# Patient Record
Sex: Female | Born: 1945 | Race: White | Hispanic: No | Marital: Married | Smoking: Current every day smoker
Health system: Southern US, Community
[De-identification: ages and names within clinical notes are randomized; demographics above are authoritative.]

## PROBLEM LIST (undated history)

## (undated) DIAGNOSIS — W19XXXA Unspecified fall, initial encounter: Secondary | ICD-10-CM

## (undated) DIAGNOSIS — I5022 Chronic systolic (congestive) heart failure: Secondary | ICD-10-CM

## (undated) DIAGNOSIS — Y92009 Unspecified place in unspecified non-institutional (private) residence as the place of occurrence of the external cause: Secondary | ICD-10-CM

## (undated) DIAGNOSIS — E785 Hyperlipidemia, unspecified: Secondary | ICD-10-CM

## (undated) DIAGNOSIS — I82409 Acute embolism and thrombosis of unspecified deep veins of unspecified lower extremity: Secondary | ICD-10-CM

## (undated) DIAGNOSIS — E782 Mixed hyperlipidemia: Secondary | ICD-10-CM

## (undated) DIAGNOSIS — I255 Ischemic cardiomyopathy: Secondary | ICD-10-CM

## (undated) DIAGNOSIS — G2581 Restless legs syndrome: Secondary | ICD-10-CM

## (undated) DIAGNOSIS — I209 Angina pectoris, unspecified: Secondary | ICD-10-CM

## (undated) DIAGNOSIS — I70219 Atherosclerosis of native arteries of extremities with intermittent claudication, unspecified extremity: Secondary | ICD-10-CM

## (undated) DIAGNOSIS — L309 Dermatitis, unspecified: Secondary | ICD-10-CM

## (undated) DIAGNOSIS — D649 Anemia, unspecified: Secondary | ICD-10-CM

## (undated) DIAGNOSIS — F329 Major depressive disorder, single episode, unspecified: Secondary | ICD-10-CM

## (undated) DIAGNOSIS — M109 Gout, unspecified: Secondary | ICD-10-CM

## (undated) DIAGNOSIS — C801 Malignant (primary) neoplasm, unspecified: Secondary | ICD-10-CM

## (undated) DIAGNOSIS — I509 Heart failure, unspecified: Secondary | ICD-10-CM

## (undated) DIAGNOSIS — F32A Depression, unspecified: Secondary | ICD-10-CM

## (undated) DIAGNOSIS — M1990 Unspecified osteoarthritis, unspecified site: Secondary | ICD-10-CM

## (undated) DIAGNOSIS — M25569 Pain in unspecified knee: Secondary | ICD-10-CM

## (undated) DIAGNOSIS — Z95810 Presence of automatic (implantable) cardiac defibrillator: Secondary | ICD-10-CM

## (undated) DIAGNOSIS — I639 Cerebral infarction, unspecified: Secondary | ICD-10-CM

## (undated) DIAGNOSIS — IMO0001 Reserved for inherently not codable concepts without codable children: Secondary | ICD-10-CM

## (undated) DIAGNOSIS — G609 Hereditary and idiopathic neuropathy, unspecified: Secondary | ICD-10-CM

## (undated) DIAGNOSIS — I739 Peripheral vascular disease, unspecified: Secondary | ICD-10-CM

## (undated) DIAGNOSIS — M549 Dorsalgia, unspecified: Secondary | ICD-10-CM

## (undated) DIAGNOSIS — F3289 Other specified depressive episodes: Secondary | ICD-10-CM

## (undated) DIAGNOSIS — K219 Gastro-esophageal reflux disease without esophagitis: Secondary | ICD-10-CM

## (undated) DIAGNOSIS — N189 Chronic kidney disease, unspecified: Secondary | ICD-10-CM

## (undated) DIAGNOSIS — Z720 Tobacco use: Secondary | ICD-10-CM

## (undated) DIAGNOSIS — I2109 ST elevation (STEMI) myocardial infarction involving other coronary artery of anterior wall: Secondary | ICD-10-CM

## (undated) DIAGNOSIS — I2510 Atherosclerotic heart disease of native coronary artery without angina pectoris: Secondary | ICD-10-CM

## (undated) DIAGNOSIS — I10 Essential (primary) hypertension: Secondary | ICD-10-CM

## (undated) DIAGNOSIS — J449 Chronic obstructive pulmonary disease, unspecified: Secondary | ICD-10-CM

## (undated) HISTORY — DX: Presence of automatic (implantable) cardiac defibrillator: Z95.810

## (undated) HISTORY — DX: Depression, unspecified: F32.A

## (undated) HISTORY — DX: Mixed hyperlipidemia: E78.2

## (undated) HISTORY — DX: Cerebral infarction, unspecified: I63.9

## (undated) HISTORY — DX: Chronic obstructive pulmonary disease, unspecified: J44.9

## (undated) HISTORY — DX: Other specified depressive episodes: F32.89

## (undated) HISTORY — PX: ABDOMINAL HYSTERECTOMY: SHX81

## (undated) HISTORY — DX: Hyperlipidemia, unspecified: E78.5

## (undated) HISTORY — DX: Gout, unspecified: M10.9

## (undated) HISTORY — DX: Chronic systolic (congestive) heart failure: I50.22

## (undated) HISTORY — PX: OTHER SURGICAL HISTORY: SHX169

## (undated) HISTORY — DX: Unspecified osteoarthritis, unspecified site: M19.90

## (undated) HISTORY — DX: Major depressive disorder, single episode, unspecified: F32.9

## (undated) HISTORY — DX: Unspecified place in unspecified non-institutional (private) residence as the place of occurrence of the external cause: Y92.009

## (undated) HISTORY — DX: Tobacco use: Z72.0

## (undated) HISTORY — PX: FOOT SURGERY: SHX648

## (undated) HISTORY — DX: ST elevation (STEMI) myocardial infarction involving other coronary artery of anterior wall: I21.09

## (undated) HISTORY — DX: Malignant (primary) neoplasm, unspecified: C80.1

## (undated) HISTORY — PX: PR VEIN BYPASS GRAFT,AORTO-FEM-POP: 35551

## (undated) HISTORY — DX: Dorsalgia, unspecified: M54.9

## (undated) HISTORY — DX: Gastro-esophageal reflux disease without esophagitis: K21.9

## (undated) HISTORY — DX: Unspecified place in unspecified non-institutional (private) residence as the place of occurrence of the external cause: W19.XXXA

## (undated) HISTORY — DX: Atherosclerosis of native arteries of extremities with intermittent claudication, unspecified extremity: I70.219

## (undated) HISTORY — DX: Acute embolism and thrombosis of unspecified deep veins of unspecified lower extremity: I82.409

## (undated) HISTORY — PX: COLONOSCOPY: SHX174

## (undated) HISTORY — DX: Pain in unspecified knee: M25.569

## (undated) HISTORY — DX: Peripheral vascular disease, unspecified: I73.9

## (undated) HISTORY — DX: Dermatitis, unspecified: L30.9

## (undated) HISTORY — DX: Atherosclerotic heart disease of native coronary artery without angina pectoris: I25.10

## (undated) HISTORY — DX: Ischemic cardiomyopathy: I25.5

## (undated) HISTORY — PX: CARDIAC DEFIBRILLATOR PLACEMENT: SHX171

## (undated) HISTORY — DX: Restless legs syndrome: G25.81

## (undated) HISTORY — DX: Chronic kidney disease, unspecified: N18.9

---

## 1998-08-02 ENCOUNTER — Ambulatory Visit (HOSPITAL_COMMUNITY): Admission: RE | Admit: 1998-08-02 | Discharge: 1998-08-02 | Payer: Self-pay | Admitting: Family Medicine

## 1998-08-02 ENCOUNTER — Encounter: Payer: Self-pay | Admitting: Family Medicine

## 1998-10-13 ENCOUNTER — Ambulatory Visit (HOSPITAL_BASED_OUTPATIENT_CLINIC_OR_DEPARTMENT_OTHER): Admission: RE | Admit: 1998-10-13 | Discharge: 1998-10-13 | Payer: Self-pay | Admitting: Plastic Surgery

## 1998-12-27 ENCOUNTER — Other Ambulatory Visit: Admission: RE | Admit: 1998-12-27 | Discharge: 1998-12-27 | Payer: Self-pay | Admitting: Family Medicine

## 2000-01-30 ENCOUNTER — Encounter: Payer: Self-pay | Admitting: Family Medicine

## 2000-01-30 ENCOUNTER — Ambulatory Visit (HOSPITAL_COMMUNITY): Admission: RE | Admit: 2000-01-30 | Discharge: 2000-01-30 | Payer: Self-pay | Admitting: Family Medicine

## 2000-04-08 ENCOUNTER — Encounter: Payer: Self-pay | Admitting: Emergency Medicine

## 2000-04-08 ENCOUNTER — Emergency Department (HOSPITAL_COMMUNITY): Admission: EM | Admit: 2000-04-08 | Discharge: 2000-04-08 | Payer: Self-pay | Admitting: Internal Medicine

## 2000-09-17 HISTORY — PX: FEMORAL-TIBIAL BYPASS GRAFT: SHX938

## 2000-09-25 ENCOUNTER — Encounter: Payer: Self-pay | Admitting: Vascular Surgery

## 2000-09-25 ENCOUNTER — Ambulatory Visit (HOSPITAL_COMMUNITY): Admission: RE | Admit: 2000-09-25 | Discharge: 2000-09-25 | Payer: Self-pay | Admitting: Vascular Surgery

## 2000-10-07 ENCOUNTER — Inpatient Hospital Stay (HOSPITAL_COMMUNITY): Admission: RE | Admit: 2000-10-07 | Discharge: 2000-10-10 | Payer: Self-pay | Admitting: Vascular Surgery

## 2000-10-07 ENCOUNTER — Encounter: Payer: Self-pay | Admitting: Vascular Surgery

## 2001-03-05 ENCOUNTER — Other Ambulatory Visit: Admission: RE | Admit: 2001-03-05 | Discharge: 2001-03-05 | Payer: Self-pay | Admitting: *Deleted

## 2001-04-24 ENCOUNTER — Ambulatory Visit (HOSPITAL_COMMUNITY): Admission: RE | Admit: 2001-04-24 | Discharge: 2001-04-25 | Payer: Self-pay | Admitting: *Deleted

## 2001-04-24 ENCOUNTER — Encounter (INDEPENDENT_AMBULATORY_CARE_PROVIDER_SITE_OTHER): Payer: Self-pay | Admitting: *Deleted

## 2002-04-08 ENCOUNTER — Ambulatory Visit (HOSPITAL_COMMUNITY): Admission: RE | Admit: 2002-04-08 | Discharge: 2002-04-08 | Payer: Self-pay | Admitting: Gastroenterology

## 2002-04-08 ENCOUNTER — Encounter (INDEPENDENT_AMBULATORY_CARE_PROVIDER_SITE_OTHER): Payer: Self-pay | Admitting: *Deleted

## 2002-04-21 ENCOUNTER — Other Ambulatory Visit: Admission: RE | Admit: 2002-04-21 | Discharge: 2002-04-21 | Payer: Self-pay | Admitting: Family Medicine

## 2002-06-09 ENCOUNTER — Encounter: Payer: Self-pay | Admitting: General Surgery

## 2002-06-09 ENCOUNTER — Encounter: Admission: RE | Admit: 2002-06-09 | Discharge: 2002-06-09 | Payer: Self-pay | Admitting: General Surgery

## 2002-06-11 ENCOUNTER — Encounter (INDEPENDENT_AMBULATORY_CARE_PROVIDER_SITE_OTHER): Payer: Self-pay | Admitting: *Deleted

## 2002-06-11 ENCOUNTER — Ambulatory Visit (HOSPITAL_BASED_OUTPATIENT_CLINIC_OR_DEPARTMENT_OTHER): Admission: RE | Admit: 2002-06-11 | Discharge: 2002-06-11 | Payer: Self-pay | Admitting: General Surgery

## 2003-01-11 ENCOUNTER — Encounter: Payer: Self-pay | Admitting: Emergency Medicine

## 2003-01-11 ENCOUNTER — Encounter: Payer: Self-pay | Admitting: Internal Medicine

## 2003-01-11 ENCOUNTER — Inpatient Hospital Stay (HOSPITAL_COMMUNITY): Admission: EM | Admit: 2003-01-11 | Discharge: 2003-01-14 | Payer: Self-pay | Admitting: Emergency Medicine

## 2003-01-12 ENCOUNTER — Encounter (INDEPENDENT_AMBULATORY_CARE_PROVIDER_SITE_OTHER): Payer: Self-pay | Admitting: Cardiology

## 2003-02-03 ENCOUNTER — Ambulatory Visit (HOSPITAL_COMMUNITY): Admission: RE | Admit: 2003-02-03 | Discharge: 2003-02-03 | Payer: Self-pay | Admitting: *Deleted

## 2003-02-17 ENCOUNTER — Ambulatory Visit (HOSPITAL_COMMUNITY): Admission: RE | Admit: 2003-02-17 | Discharge: 2003-02-18 | Payer: Self-pay | Admitting: *Deleted

## 2003-12-04 ENCOUNTER — Emergency Department (HOSPITAL_COMMUNITY): Admission: EM | Admit: 2003-12-04 | Discharge: 2003-12-04 | Payer: Self-pay | Admitting: Emergency Medicine

## 2003-12-06 ENCOUNTER — Inpatient Hospital Stay (HOSPITAL_COMMUNITY): Admission: AD | Admit: 2003-12-06 | Discharge: 2003-12-10 | Payer: Self-pay | Admitting: Vascular Surgery

## 2003-12-07 ENCOUNTER — Encounter (INDEPENDENT_AMBULATORY_CARE_PROVIDER_SITE_OTHER): Payer: Self-pay | Admitting: Cardiology

## 2004-06-26 ENCOUNTER — Ambulatory Visit: Payer: Self-pay | Admitting: Internal Medicine

## 2004-06-26 ENCOUNTER — Ambulatory Visit (HOSPITAL_COMMUNITY): Admission: RE | Admit: 2004-06-26 | Discharge: 2004-06-26 | Payer: Self-pay | Admitting: Internal Medicine

## 2004-08-04 ENCOUNTER — Ambulatory Visit: Payer: Self-pay | Admitting: Internal Medicine

## 2004-08-09 ENCOUNTER — Ambulatory Visit: Payer: Self-pay | Admitting: Internal Medicine

## 2004-09-13 ENCOUNTER — Ambulatory Visit (HOSPITAL_COMMUNITY): Admission: RE | Admit: 2004-09-13 | Discharge: 2004-09-13 | Payer: Self-pay | Admitting: Internal Medicine

## 2004-10-25 ENCOUNTER — Ambulatory Visit (HOSPITAL_COMMUNITY): Admission: RE | Admit: 2004-10-25 | Discharge: 2004-10-25 | Payer: Self-pay | Admitting: Internal Medicine

## 2004-10-25 ENCOUNTER — Ambulatory Visit: Payer: Self-pay | Admitting: Internal Medicine

## 2004-11-03 ENCOUNTER — Ambulatory Visit: Payer: Self-pay | Admitting: Internal Medicine

## 2004-11-08 ENCOUNTER — Ambulatory Visit: Payer: Self-pay | Admitting: Internal Medicine

## 2005-01-12 ENCOUNTER — Ambulatory Visit (HOSPITAL_COMMUNITY): Admission: RE | Admit: 2005-01-12 | Discharge: 2005-01-12 | Payer: Self-pay | Admitting: Internal Medicine

## 2005-01-12 ENCOUNTER — Ambulatory Visit: Payer: Self-pay | Admitting: Internal Medicine

## 2005-01-17 ENCOUNTER — Ambulatory Visit (HOSPITAL_COMMUNITY): Admission: RE | Admit: 2005-01-17 | Discharge: 2005-01-17 | Payer: Self-pay | Admitting: Internal Medicine

## 2005-01-23 ENCOUNTER — Ambulatory Visit (HOSPITAL_COMMUNITY): Admission: RE | Admit: 2005-01-23 | Discharge: 2005-01-23 | Payer: Self-pay | Admitting: Internal Medicine

## 2005-01-23 ENCOUNTER — Ambulatory Visit: Payer: Self-pay | Admitting: Internal Medicine

## 2005-02-15 ENCOUNTER — Ambulatory Visit: Payer: Self-pay | Admitting: Internal Medicine

## 2005-03-01 ENCOUNTER — Ambulatory Visit: Payer: Self-pay | Admitting: Internal Medicine

## 2005-03-08 ENCOUNTER — Ambulatory Visit (HOSPITAL_COMMUNITY): Admission: RE | Admit: 2005-03-08 | Discharge: 2005-03-08 | Payer: Self-pay | Admitting: Internal Medicine

## 2005-03-09 ENCOUNTER — Ambulatory Visit: Payer: Self-pay

## 2005-03-29 ENCOUNTER — Ambulatory Visit: Payer: Self-pay | Admitting: Internal Medicine

## 2005-04-18 ENCOUNTER — Ambulatory Visit (HOSPITAL_COMMUNITY): Admission: RE | Admit: 2005-04-18 | Discharge: 2005-04-18 | Payer: Self-pay | Admitting: Vascular Surgery

## 2005-04-23 ENCOUNTER — Inpatient Hospital Stay (HOSPITAL_COMMUNITY): Admission: RE | Admit: 2005-04-23 | Discharge: 2005-04-25 | Payer: Self-pay | Admitting: Vascular Surgery

## 2005-06-06 ENCOUNTER — Ambulatory Visit: Payer: Self-pay | Admitting: Internal Medicine

## 2005-11-26 ENCOUNTER — Ambulatory Visit: Payer: Self-pay | Admitting: Internal Medicine

## 2005-11-28 ENCOUNTER — Ambulatory Visit (HOSPITAL_COMMUNITY): Admission: RE | Admit: 2005-11-28 | Discharge: 2005-11-28 | Payer: Self-pay | Admitting: Internal Medicine

## 2005-11-30 ENCOUNTER — Ambulatory Visit: Payer: Self-pay | Admitting: Internal Medicine

## 2005-12-11 ENCOUNTER — Ambulatory Visit: Payer: Self-pay | Admitting: Internal Medicine

## 2005-12-19 ENCOUNTER — Ambulatory Visit: Payer: Self-pay | Admitting: Gastroenterology

## 2006-01-23 ENCOUNTER — Ambulatory Visit: Payer: Self-pay | Admitting: Gastroenterology

## 2006-01-23 ENCOUNTER — Encounter (INDEPENDENT_AMBULATORY_CARE_PROVIDER_SITE_OTHER): Payer: Self-pay | Admitting: *Deleted

## 2006-01-31 ENCOUNTER — Ambulatory Visit: Payer: Self-pay | Admitting: Internal Medicine

## 2006-02-06 ENCOUNTER — Ambulatory Visit (HOSPITAL_COMMUNITY): Admission: RE | Admit: 2006-02-06 | Discharge: 2006-02-06 | Payer: Self-pay | Admitting: Gastroenterology

## 2006-02-19 ENCOUNTER — Ambulatory Visit: Payer: Self-pay | Admitting: Gastroenterology

## 2006-03-20 ENCOUNTER — Emergency Department (HOSPITAL_COMMUNITY): Admission: EM | Admit: 2006-03-20 | Discharge: 2006-03-20 | Payer: Self-pay | Admitting: Emergency Medicine

## 2006-03-20 ENCOUNTER — Encounter (INDEPENDENT_AMBULATORY_CARE_PROVIDER_SITE_OTHER): Payer: Self-pay | Admitting: *Deleted

## 2006-03-28 ENCOUNTER — Encounter (INDEPENDENT_AMBULATORY_CARE_PROVIDER_SITE_OTHER): Payer: Self-pay | Admitting: *Deleted

## 2006-04-03 ENCOUNTER — Ambulatory Visit: Payer: Self-pay | Admitting: Gastroenterology

## 2006-07-04 ENCOUNTER — Ambulatory Visit: Payer: Self-pay | Admitting: Internal Medicine

## 2006-07-04 ENCOUNTER — Encounter (INDEPENDENT_AMBULATORY_CARE_PROVIDER_SITE_OTHER): Payer: Self-pay | Admitting: Internal Medicine

## 2006-07-04 LAB — CONVERTED CEMR LAB
Hemoglobin: 12.2 g/dL (ref 12.0–15.0)
Platelets: 345 10*3/uL (ref 150–400)

## 2006-12-17 ENCOUNTER — Ambulatory Visit: Payer: Self-pay | Admitting: Internal Medicine

## 2006-12-17 DIAGNOSIS — G2581 Restless legs syndrome: Secondary | ICD-10-CM | POA: Insufficient documentation

## 2006-12-17 DIAGNOSIS — F17200 Nicotine dependence, unspecified, uncomplicated: Secondary | ICD-10-CM

## 2006-12-17 DIAGNOSIS — M949 Disorder of cartilage, unspecified: Secondary | ICD-10-CM

## 2006-12-17 DIAGNOSIS — M899 Disorder of bone, unspecified: Secondary | ICD-10-CM | POA: Insufficient documentation

## 2006-12-17 DIAGNOSIS — D6489 Other specified anemias: Secondary | ICD-10-CM | POA: Insufficient documentation

## 2006-12-17 DIAGNOSIS — K219 Gastro-esophageal reflux disease without esophagitis: Secondary | ICD-10-CM

## 2006-12-17 DIAGNOSIS — M545 Low back pain: Secondary | ICD-10-CM | POA: Insufficient documentation

## 2006-12-17 DIAGNOSIS — N200 Calculus of kidney: Secondary | ICD-10-CM | POA: Insufficient documentation

## 2006-12-17 DIAGNOSIS — E785 Hyperlipidemia, unspecified: Secondary | ICD-10-CM

## 2006-12-17 DIAGNOSIS — I10 Essential (primary) hypertension: Secondary | ICD-10-CM | POA: Insufficient documentation

## 2007-01-02 ENCOUNTER — Encounter (INDEPENDENT_AMBULATORY_CARE_PROVIDER_SITE_OTHER): Payer: Self-pay | Admitting: Internal Medicine

## 2007-01-08 ENCOUNTER — Encounter: Admission: RE | Admit: 2007-01-08 | Discharge: 2007-01-08 | Payer: Self-pay | Admitting: Orthopedic Surgery

## 2007-01-09 ENCOUNTER — Encounter (INDEPENDENT_AMBULATORY_CARE_PROVIDER_SITE_OTHER): Payer: Self-pay | Admitting: Internal Medicine

## 2007-01-09 ENCOUNTER — Telehealth: Payer: Self-pay | Admitting: *Deleted

## 2007-01-13 ENCOUNTER — Encounter (INDEPENDENT_AMBULATORY_CARE_PROVIDER_SITE_OTHER): Payer: Self-pay | Admitting: Internal Medicine

## 2007-01-13 DIAGNOSIS — I739 Peripheral vascular disease, unspecified: Secondary | ICD-10-CM

## 2007-01-30 ENCOUNTER — Ambulatory Visit: Payer: Self-pay | Admitting: Hospitalist

## 2007-01-30 ENCOUNTER — Encounter (INDEPENDENT_AMBULATORY_CARE_PROVIDER_SITE_OTHER): Payer: Self-pay | Admitting: Pulmonary Disease

## 2007-01-30 DIAGNOSIS — F329 Major depressive disorder, single episode, unspecified: Secondary | ICD-10-CM

## 2007-01-30 DIAGNOSIS — N390 Urinary tract infection, site not specified: Secondary | ICD-10-CM

## 2007-01-30 LAB — CONVERTED CEMR LAB
Bilirubin Urine: NEGATIVE
Bilirubin Urine: NEGATIVE
CO2: 23 meq/L (ref 19–32)
Chloride: 107 meq/L (ref 96–112)
Creatinine, Ser: 0.92 mg/dL (ref 0.40–1.20)
Glucose, Bld: 91 mg/dL (ref 70–99)
Ketones, ur: NEGATIVE mg/dL
Sodium: 142 meq/L (ref 135–145)
Specific Gravity, Urine: <1.005
Specific Gravity, Urine: 1.005 (ref 1.005–1.03)
Urobilinogen, UA: 0.2
pH: 5.5
pH: 6.5 (ref 5.0–8.0)

## 2007-02-18 ENCOUNTER — Telehealth (INDEPENDENT_AMBULATORY_CARE_PROVIDER_SITE_OTHER): Payer: Self-pay | Admitting: *Deleted

## 2007-04-15 ENCOUNTER — Telehealth: Payer: Self-pay | Admitting: Internal Medicine

## 2007-04-18 ENCOUNTER — Ambulatory Visit: Payer: Self-pay | Admitting: Vascular Surgery

## 2007-05-12 ENCOUNTER — Telehealth: Payer: Self-pay | Admitting: *Deleted

## 2007-06-11 ENCOUNTER — Telehealth: Payer: Self-pay | Admitting: *Deleted

## 2007-07-09 ENCOUNTER — Encounter (INDEPENDENT_AMBULATORY_CARE_PROVIDER_SITE_OTHER): Payer: Self-pay | Admitting: *Deleted

## 2007-07-09 ENCOUNTER — Ambulatory Visit (HOSPITAL_COMMUNITY): Admission: RE | Admit: 2007-07-09 | Discharge: 2007-07-09 | Payer: Self-pay | Admitting: Infectious Diseases

## 2007-07-09 ENCOUNTER — Ambulatory Visit: Payer: Self-pay | Admitting: Infectious Diseases

## 2007-07-09 DIAGNOSIS — R109 Unspecified abdominal pain: Secondary | ICD-10-CM | POA: Insufficient documentation

## 2007-07-09 DIAGNOSIS — R51 Headache: Secondary | ICD-10-CM

## 2007-07-09 DIAGNOSIS — R634 Abnormal weight loss: Secondary | ICD-10-CM

## 2007-07-09 DIAGNOSIS — R519 Headache, unspecified: Secondary | ICD-10-CM | POA: Insufficient documentation

## 2007-07-09 LAB — CONVERTED CEMR LAB
Albumin: 3.5 g/dL (ref 3.5–5.2)
Alkaline Phosphatase: 110 units/L (ref 39–117)
CO2: 27 meq/L (ref 19–32)
Chloride: 105 meq/L (ref 96–112)
Glucose, Bld: 87 mg/dL (ref 70–99)
Lymphocytes Relative: 26 % (ref 12–46)
Lymphs Abs: 2.8 10*3/uL (ref 0.7–3.3)
Monocytes Relative: 6 % (ref 3–11)
Neutro Abs: 7 10*3/uL (ref 1.7–7.7)
Neutrophils Relative %: 66 % (ref 43–77)
Potassium: 4.1 meq/L (ref 3.5–5.3)
RBC: 4.46 M/uL (ref 3.87–5.11)
Sodium: 141 meq/L (ref 135–145)
TSH: 2.721 microintl units/mL (ref 0.350–5.50)
Total Protein: 6.8 g/dL (ref 6.0–8.3)
WBC: 10.6 10*3/uL — ABNORMAL HIGH (ref 4.0–10.5)

## 2007-07-15 ENCOUNTER — Ambulatory Visit: Payer: Self-pay | Admitting: Internal Medicine

## 2007-07-15 DIAGNOSIS — G4700 Insomnia, unspecified: Secondary | ICD-10-CM | POA: Insufficient documentation

## 2007-07-15 LAB — CONVERTED CEMR LAB: Cholesterol, target level: 200 mg/dL

## 2007-07-28 ENCOUNTER — Telehealth: Payer: Self-pay | Admitting: *Deleted

## 2007-08-13 ENCOUNTER — Telehealth (INDEPENDENT_AMBULATORY_CARE_PROVIDER_SITE_OTHER): Payer: Self-pay | Admitting: Internal Medicine

## 2007-09-09 ENCOUNTER — Telehealth (INDEPENDENT_AMBULATORY_CARE_PROVIDER_SITE_OTHER): Payer: Self-pay | Admitting: Internal Medicine

## 2007-09-10 ENCOUNTER — Telehealth (INDEPENDENT_AMBULATORY_CARE_PROVIDER_SITE_OTHER): Payer: Self-pay | Admitting: Pharmacy Technician

## 2007-09-15 ENCOUNTER — Telehealth: Payer: Self-pay | Admitting: *Deleted

## 2007-09-16 ENCOUNTER — Telehealth: Payer: Self-pay | Admitting: *Deleted

## 2007-09-23 ENCOUNTER — Encounter (INDEPENDENT_AMBULATORY_CARE_PROVIDER_SITE_OTHER): Payer: Self-pay | Admitting: Internal Medicine

## 2007-09-23 ENCOUNTER — Ambulatory Visit: Payer: Self-pay | Admitting: Internal Medicine

## 2007-09-23 DIAGNOSIS — L989 Disorder of the skin and subcutaneous tissue, unspecified: Secondary | ICD-10-CM | POA: Insufficient documentation

## 2007-09-24 ENCOUNTER — Encounter (INDEPENDENT_AMBULATORY_CARE_PROVIDER_SITE_OTHER): Payer: Self-pay | Admitting: Internal Medicine

## 2007-10-03 ENCOUNTER — Telehealth: Payer: Self-pay | Admitting: *Deleted

## 2007-10-03 ENCOUNTER — Telehealth (INDEPENDENT_AMBULATORY_CARE_PROVIDER_SITE_OTHER): Payer: Self-pay | Admitting: Internal Medicine

## 2007-10-07 ENCOUNTER — Telehealth (INDEPENDENT_AMBULATORY_CARE_PROVIDER_SITE_OTHER): Payer: Self-pay | Admitting: Internal Medicine

## 2007-10-15 ENCOUNTER — Ambulatory Visit: Payer: Self-pay | Admitting: Internal Medicine

## 2007-10-15 ENCOUNTER — Encounter (INDEPENDENT_AMBULATORY_CARE_PROVIDER_SITE_OTHER): Payer: Self-pay | Admitting: Internal Medicine

## 2007-10-15 DIAGNOSIS — R5381 Other malaise: Secondary | ICD-10-CM

## 2007-10-15 DIAGNOSIS — R5383 Other fatigue: Secondary | ICD-10-CM

## 2007-10-15 DIAGNOSIS — L219 Seborrheic dermatitis, unspecified: Secondary | ICD-10-CM | POA: Insufficient documentation

## 2007-10-17 LAB — CONVERTED CEMR LAB
Alkaline Phosphatase: 118 units/L — ABNORMAL HIGH (ref 39–117)
BUN: 17 mg/dL (ref 6–23)
Free T4: 1.07 ng/dL (ref 0.89–1.80)
Glucose, Bld: 101 mg/dL — ABNORMAL HIGH (ref 70–99)
MCHC: 32 g/dL (ref 30.0–36.0)
MCV: 90.4 fL (ref 78.0–100.0)
RBC: 4.29 M/uL (ref 3.87–5.11)
Sodium: 140 meq/L (ref 135–145)
Total Bilirubin: 0.2 mg/dL — ABNORMAL LOW (ref 0.3–1.2)
Total Protein: 7.8 g/dL (ref 6.0–8.3)
Vit D, 1,25-Dihydroxy: 21 — ABNORMAL LOW (ref 30–89)

## 2007-12-05 ENCOUNTER — Encounter (INDEPENDENT_AMBULATORY_CARE_PROVIDER_SITE_OTHER): Payer: Self-pay | Admitting: Internal Medicine

## 2007-12-05 ENCOUNTER — Ambulatory Visit: Payer: Self-pay | Admitting: Hospitalist

## 2007-12-10 LAB — CONVERTED CEMR LAB
HDL: 70 mg/dL (ref >39)
Triglycerides: 169 mg/dL — ABNORMAL HIGH (ref <150)

## 2007-12-19 ENCOUNTER — Ambulatory Visit: Payer: Self-pay | Admitting: Internal Medicine

## 2007-12-19 ENCOUNTER — Encounter (INDEPENDENT_AMBULATORY_CARE_PROVIDER_SITE_OTHER): Payer: Self-pay | Admitting: Internal Medicine

## 2007-12-29 ENCOUNTER — Telehealth (INDEPENDENT_AMBULATORY_CARE_PROVIDER_SITE_OTHER): Payer: Self-pay | Admitting: Internal Medicine

## 2008-01-27 ENCOUNTER — Ambulatory Visit: Payer: Self-pay | Admitting: Internal Medicine

## 2008-01-27 ENCOUNTER — Encounter (INDEPENDENT_AMBULATORY_CARE_PROVIDER_SITE_OTHER): Payer: Self-pay | Admitting: Internal Medicine

## 2008-01-27 DIAGNOSIS — N951 Menopausal and female climacteric states: Secondary | ICD-10-CM | POA: Insufficient documentation

## 2008-01-28 LAB — CONVERTED CEMR LAB
ALT: <8 units/L (ref 0–35)
AST: 12 units/L (ref 0–37)
Creatinine, Ser: 1.29 mg/dL — ABNORMAL HIGH (ref 0.40–1.20)
HCT: 37.2 % (ref 36.0–46.0)
MCHC: 32 g/dL (ref 30.0–36.0)
MCV: 87.9 fL (ref 78.0–100.0)
Platelets: 311 10*3/uL (ref 150–400)
RDW: 14.7 % (ref 11.5–15.5)
Total Bilirubin: 0.2 mg/dL — ABNORMAL LOW (ref 0.3–1.2)

## 2008-01-30 ENCOUNTER — Encounter (INDEPENDENT_AMBULATORY_CARE_PROVIDER_SITE_OTHER): Payer: Self-pay | Admitting: Internal Medicine

## 2008-01-30 ENCOUNTER — Ambulatory Visit (HOSPITAL_COMMUNITY): Admission: RE | Admit: 2008-01-30 | Discharge: 2008-01-30 | Payer: Self-pay | Admitting: Internal Medicine

## 2008-03-04 ENCOUNTER — Telehealth (INDEPENDENT_AMBULATORY_CARE_PROVIDER_SITE_OTHER): Payer: Self-pay | Admitting: Internal Medicine

## 2008-03-16 ENCOUNTER — Telehealth (INDEPENDENT_AMBULATORY_CARE_PROVIDER_SITE_OTHER): Payer: Self-pay | Admitting: Internal Medicine

## 2008-03-22 ENCOUNTER — Telehealth (INDEPENDENT_AMBULATORY_CARE_PROVIDER_SITE_OTHER): Payer: Self-pay | Admitting: Internal Medicine

## 2008-03-23 ENCOUNTER — Ambulatory Visit: Payer: Self-pay | Admitting: Vascular Surgery

## 2008-05-13 ENCOUNTER — Telehealth (INDEPENDENT_AMBULATORY_CARE_PROVIDER_SITE_OTHER): Payer: Self-pay | Admitting: Internal Medicine

## 2008-05-25 ENCOUNTER — Telehealth: Payer: Self-pay | Admitting: *Deleted

## 2008-06-15 ENCOUNTER — Telehealth (INDEPENDENT_AMBULATORY_CARE_PROVIDER_SITE_OTHER): Payer: Self-pay | Admitting: Internal Medicine

## 2008-07-19 ENCOUNTER — Telehealth (INDEPENDENT_AMBULATORY_CARE_PROVIDER_SITE_OTHER): Payer: Self-pay | Admitting: Internal Medicine

## 2008-08-31 ENCOUNTER — Ambulatory Visit (HOSPITAL_COMMUNITY): Admission: RE | Admit: 2008-08-31 | Discharge: 2008-08-31 | Payer: Self-pay | Admitting: Internal Medicine

## 2008-08-31 ENCOUNTER — Encounter: Payer: Self-pay | Admitting: Licensed Clinical Social Worker

## 2008-08-31 ENCOUNTER — Encounter (INDEPENDENT_AMBULATORY_CARE_PROVIDER_SITE_OTHER): Payer: Self-pay | Admitting: Internal Medicine

## 2008-08-31 ENCOUNTER — Ambulatory Visit: Payer: Self-pay | Admitting: Internal Medicine

## 2008-08-31 DIAGNOSIS — J069 Acute upper respiratory infection, unspecified: Secondary | ICD-10-CM | POA: Insufficient documentation

## 2008-08-31 LAB — CONVERTED CEMR LAB
Basophils Absolute: 0.1 10*3/uL (ref 0.0–0.1)
Eosinophils Absolute: 0.2 10*3/uL (ref 0.0–0.7)
Eosinophils Relative: 2 % (ref 0–5)
HCT: 36.8 % (ref 36.0–46.0)
Lymphs Abs: 2.7 10*3/uL (ref 0.7–4.0)
MCV: 85.7 fL (ref 78.0–100.0)
Monocytes Absolute: 0.8 10*3/uL (ref 0.1–1.0)
Platelets: 233 10*3/uL (ref 150–400)
RDW: 14.7 % (ref 11.5–15.5)

## 2008-09-17 DIAGNOSIS — I2109 ST elevation (STEMI) myocardial infarction involving other coronary artery of anterior wall: Secondary | ICD-10-CM

## 2008-09-17 HISTORY — DX: ST elevation (STEMI) myocardial infarction involving other coronary artery of anterior wall: I21.09

## 2008-10-06 ENCOUNTER — Ambulatory Visit: Payer: Self-pay | Admitting: Infectious Disease

## 2008-10-06 ENCOUNTER — Encounter (INDEPENDENT_AMBULATORY_CARE_PROVIDER_SITE_OTHER): Payer: Self-pay | Admitting: Internal Medicine

## 2008-10-06 LAB — CONVERTED CEMR LAB
Alkaline Phosphatase: 121 units/L — ABNORMAL HIGH (ref 39–117)
Creatinine, Ser: 1.07 mg/dL (ref 0.40–1.20)
Glucose, Bld: 90 mg/dL (ref 70–99)
Sodium: 140 meq/L (ref 135–145)
Total Bilirubin: 0.3 mg/dL (ref 0.3–1.2)
Total Protein: 7.8 g/dL (ref 6.0–8.3)

## 2008-10-19 ENCOUNTER — Inpatient Hospital Stay (HOSPITAL_COMMUNITY): Admission: EM | Admit: 2008-10-19 | Discharge: 2008-10-24 | Payer: Self-pay | Admitting: Gastroenterology

## 2008-10-23 ENCOUNTER — Encounter: Payer: Self-pay | Admitting: Internal Medicine

## 2008-11-01 ENCOUNTER — Encounter: Payer: Self-pay | Admitting: Internal Medicine

## 2008-11-01 ENCOUNTER — Encounter: Admission: RE | Admit: 2008-11-01 | Discharge: 2008-11-01 | Payer: Self-pay | Admitting: Cardiology

## 2008-11-02 ENCOUNTER — Inpatient Hospital Stay (HOSPITAL_COMMUNITY): Admission: EM | Admit: 2008-11-02 | Discharge: 2008-11-06 | Payer: Self-pay | Admitting: Emergency Medicine

## 2008-11-04 ENCOUNTER — Encounter (INDEPENDENT_AMBULATORY_CARE_PROVIDER_SITE_OTHER): Payer: Self-pay | Admitting: Interventional Cardiology

## 2008-11-22 ENCOUNTER — Telehealth (INDEPENDENT_AMBULATORY_CARE_PROVIDER_SITE_OTHER): Payer: Self-pay | Admitting: Internal Medicine

## 2008-12-23 ENCOUNTER — Telehealth: Payer: Self-pay | Admitting: Internal Medicine

## 2008-12-27 ENCOUNTER — Encounter (INDEPENDENT_AMBULATORY_CARE_PROVIDER_SITE_OTHER): Payer: Self-pay | Admitting: *Deleted

## 2009-01-07 ENCOUNTER — Telehealth (INDEPENDENT_AMBULATORY_CARE_PROVIDER_SITE_OTHER): Payer: Self-pay | Admitting: Internal Medicine

## 2009-01-11 ENCOUNTER — Ambulatory Visit: Payer: Self-pay | Admitting: Internal Medicine

## 2009-01-11 DIAGNOSIS — I509 Heart failure, unspecified: Secondary | ICD-10-CM | POA: Insufficient documentation

## 2009-01-11 DIAGNOSIS — I2510 Atherosclerotic heart disease of native coronary artery without angina pectoris: Secondary | ICD-10-CM | POA: Insufficient documentation

## 2009-01-11 DIAGNOSIS — I2109 ST elevation (STEMI) myocardial infarction involving other coronary artery of anterior wall: Secondary | ICD-10-CM | POA: Insufficient documentation

## 2009-01-14 ENCOUNTER — Encounter: Payer: Self-pay | Admitting: Internal Medicine

## 2009-01-19 ENCOUNTER — Encounter: Payer: Self-pay | Admitting: Internal Medicine

## 2009-01-27 ENCOUNTER — Telehealth (INDEPENDENT_AMBULATORY_CARE_PROVIDER_SITE_OTHER): Payer: Self-pay | Admitting: Internal Medicine

## 2009-02-03 ENCOUNTER — Ambulatory Visit (HOSPITAL_COMMUNITY): Admission: RE | Admit: 2009-02-03 | Discharge: 2009-02-03 | Payer: Self-pay | Admitting: Interventional Cardiology

## 2009-02-03 ENCOUNTER — Ambulatory Visit: Payer: Self-pay | Admitting: Vascular Surgery

## 2009-02-16 ENCOUNTER — Telehealth (INDEPENDENT_AMBULATORY_CARE_PROVIDER_SITE_OTHER): Payer: Self-pay | Admitting: Internal Medicine

## 2009-02-23 ENCOUNTER — Telehealth (INDEPENDENT_AMBULATORY_CARE_PROVIDER_SITE_OTHER): Payer: Self-pay | Admitting: Internal Medicine

## 2009-03-17 HISTORY — PX: FEMORAL-FEMORAL BYPASS GRAFT: SHX936

## 2009-03-22 ENCOUNTER — Ambulatory Visit: Payer: Self-pay | Admitting: Vascular Surgery

## 2009-04-07 ENCOUNTER — Ambulatory Visit: Payer: Self-pay | Admitting: Vascular Surgery

## 2009-04-07 ENCOUNTER — Inpatient Hospital Stay (HOSPITAL_COMMUNITY): Admission: RE | Admit: 2009-04-07 | Discharge: 2009-04-12 | Payer: Self-pay | Admitting: Vascular Surgery

## 2009-04-25 ENCOUNTER — Encounter: Payer: Self-pay | Admitting: Internal Medicine

## 2009-04-26 ENCOUNTER — Ambulatory Visit: Payer: Self-pay | Admitting: Vascular Surgery

## 2009-05-06 ENCOUNTER — Encounter: Payer: Self-pay | Admitting: Internal Medicine

## 2009-05-24 ENCOUNTER — Telehealth (INDEPENDENT_AMBULATORY_CARE_PROVIDER_SITE_OTHER): Payer: Self-pay | Admitting: *Deleted

## 2009-05-25 ENCOUNTER — Ambulatory Visit: Payer: Self-pay | Admitting: Internal Medicine

## 2009-05-25 DIAGNOSIS — I5022 Chronic systolic (congestive) heart failure: Secondary | ICD-10-CM

## 2009-05-25 DIAGNOSIS — I2510 Atherosclerotic heart disease of native coronary artery without angina pectoris: Secondary | ICD-10-CM | POA: Insufficient documentation

## 2009-05-27 ENCOUNTER — Telehealth (INDEPENDENT_AMBULATORY_CARE_PROVIDER_SITE_OTHER): Payer: Self-pay | Admitting: *Deleted

## 2009-06-01 ENCOUNTER — Ambulatory Visit: Payer: Self-pay | Admitting: Internal Medicine

## 2009-06-01 DIAGNOSIS — I2589 Other forms of chronic ischemic heart disease: Secondary | ICD-10-CM

## 2009-06-02 LAB — CONVERTED CEMR LAB
BUN: 17 mg/dL (ref 6–23)
Basophils Absolute: 0 10*3/uL (ref 0.0–0.1)
CO2: 27 meq/L (ref 19–32)
Eosinophils Absolute: 0.1 10*3/uL (ref 0.0–0.7)
GFR calc non Af Amer: 40.4 mL/min (ref >60)
Glucose, Bld: 86 mg/dL (ref 70–99)
HCT: 35.4 % — ABNORMAL LOW (ref 36.0–46.0)
Hemoglobin: 11.8 g/dL — ABNORMAL LOW (ref 12.0–15.0)
Lymphs Abs: 1.9 10*3/uL (ref 0.7–4.0)
MCHC: 33.2 g/dL (ref 30.0–36.0)
MCV: 83.2 fL (ref 78.0–100.0)
Monocytes Absolute: 0.5 10*3/uL (ref 0.1–1.0)
Neutro Abs: 4.4 10*3/uL (ref 1.4–7.7)
Platelets: 188 10*3/uL (ref 150.0–400.0)
Potassium: 4.1 meq/L (ref 3.5–5.1)
Prothrombin Time: 10.6 s (ref 9.1–11.7)
RDW: 22.4 % — ABNORMAL HIGH (ref 11.5–14.6)

## 2009-06-06 ENCOUNTER — Ambulatory Visit (HOSPITAL_COMMUNITY): Admission: RE | Admit: 2009-06-06 | Discharge: 2009-06-07 | Payer: Self-pay | Admitting: Cardiology

## 2009-06-06 ENCOUNTER — Telehealth: Payer: Self-pay | Admitting: Internal Medicine

## 2009-06-06 ENCOUNTER — Ambulatory Visit: Payer: Self-pay | Admitting: Internal Medicine

## 2009-06-07 ENCOUNTER — Encounter: Payer: Self-pay | Admitting: Internal Medicine

## 2009-06-16 ENCOUNTER — Telehealth: Payer: Self-pay | Admitting: Internal Medicine

## 2009-06-23 ENCOUNTER — Ambulatory Visit: Payer: Self-pay

## 2009-06-23 ENCOUNTER — Ambulatory Visit: Payer: Self-pay | Admitting: Internal Medicine

## 2009-06-23 ENCOUNTER — Encounter (INDEPENDENT_AMBULATORY_CARE_PROVIDER_SITE_OTHER): Payer: Self-pay | Admitting: *Deleted

## 2009-06-23 DIAGNOSIS — Z95810 Presence of automatic (implantable) cardiac defibrillator: Secondary | ICD-10-CM

## 2009-07-27 ENCOUNTER — Ambulatory Visit: Payer: Self-pay | Admitting: Internal Medicine

## 2009-08-18 ENCOUNTER — Ambulatory Visit: Payer: Self-pay | Admitting: Internal Medicine

## 2009-08-18 DIAGNOSIS — R42 Dizziness and giddiness: Secondary | ICD-10-CM

## 2009-09-26 ENCOUNTER — Telehealth: Payer: Self-pay | Admitting: Internal Medicine

## 2009-09-28 ENCOUNTER — Telehealth: Payer: Self-pay | Admitting: *Deleted

## 2009-10-12 ENCOUNTER — Telehealth (INDEPENDENT_AMBULATORY_CARE_PROVIDER_SITE_OTHER): Payer: Self-pay | Admitting: *Deleted

## 2009-10-20 ENCOUNTER — Telehealth: Payer: Self-pay | Admitting: *Deleted

## 2009-10-21 ENCOUNTER — Ambulatory Visit: Payer: Self-pay | Admitting: Vascular Surgery

## 2009-10-24 ENCOUNTER — Telehealth: Payer: Self-pay | Admitting: Internal Medicine

## 2009-11-01 ENCOUNTER — Ambulatory Visit: Payer: Self-pay | Admitting: Gastroenterology

## 2009-11-15 ENCOUNTER — Ambulatory Visit: Payer: Self-pay | Admitting: Vascular Surgery

## 2009-11-27 ENCOUNTER — Encounter: Payer: Self-pay | Admitting: Internal Medicine

## 2009-11-28 ENCOUNTER — Ambulatory Visit: Payer: Self-pay | Admitting: Internal Medicine

## 2009-12-02 ENCOUNTER — Ambulatory Visit: Payer: Self-pay | Admitting: Vascular Surgery

## 2009-12-05 ENCOUNTER — Telehealth: Payer: Self-pay | Admitting: Internal Medicine

## 2009-12-13 ENCOUNTER — Encounter: Payer: Self-pay | Admitting: Internal Medicine

## 2009-12-19 ENCOUNTER — Telehealth (INDEPENDENT_AMBULATORY_CARE_PROVIDER_SITE_OTHER): Payer: Self-pay | Admitting: Internal Medicine

## 2010-01-05 ENCOUNTER — Ambulatory Visit (HOSPITAL_COMMUNITY): Admission: RE | Admit: 2010-01-05 | Discharge: 2010-01-05 | Payer: Self-pay | Admitting: Hematology & Oncology

## 2010-02-27 ENCOUNTER — Ambulatory Visit: Payer: Self-pay | Admitting: Internal Medicine

## 2010-03-16 ENCOUNTER — Encounter: Payer: Self-pay | Admitting: Internal Medicine

## 2010-03-30 ENCOUNTER — Telehealth: Payer: Self-pay | Admitting: Internal Medicine

## 2010-04-07 ENCOUNTER — Telehealth: Payer: Self-pay | Admitting: *Deleted

## 2010-04-24 ENCOUNTER — Inpatient Hospital Stay (HOSPITAL_COMMUNITY): Admission: AD | Admit: 2010-04-24 | Discharge: 2010-04-28 | Payer: Self-pay | Admitting: Internal Medicine

## 2010-04-25 ENCOUNTER — Ambulatory Visit: Payer: Self-pay | Admitting: Internal Medicine

## 2010-04-28 ENCOUNTER — Encounter: Payer: Self-pay | Admitting: Internal Medicine

## 2010-05-04 ENCOUNTER — Telehealth (INDEPENDENT_AMBULATORY_CARE_PROVIDER_SITE_OTHER): Payer: Self-pay | Admitting: *Deleted

## 2010-05-23 ENCOUNTER — Ambulatory Visit: Payer: Self-pay | Admitting: Vascular Surgery

## 2010-10-08 ENCOUNTER — Encounter: Payer: Self-pay | Admitting: Internal Medicine

## 2010-10-19 NOTE — Procedures (Signed)
Summary: EGD   EGD  Procedure date:  04/08/2002  Findings:      Location: [facility]                     [facility]  Patient:    [name], [name] Visit Number: [id number] MRN: [id number]          Service Type: END Location: ENDO Attending Physician:  Charna Elizabeth Dictated by:   Anselmo Rod, M.D. Proc. Date: 04/08/02 Admit Date:  04/08/2002 Discharge Date: 04/08/2002   CC:         Arvella Merles, M.D.   Procedure Report  DATE OF BIRTH:  1946/02/03  PROCEDURE PERFORMED:  Esophagogastroduodenoscopy.  ENDOSCOPIST:  Anselmo Rod, M.D.  INSTRUMENT USED:  Olympus video panendoscope.  INDICATIONS FOR PROCEDURE:  Dysphagia in a 65 year old white female, rule out esophageal strictures, esophagitis, etc.  PREPROCEDURE PREPARATION:  Informed consent was procured from the patient. The patient was fasted for eight hours prior to the procedure.  PREPROCEDURE PHYSICAL:  VITAL SIGNS:  Stable.  NECK:  Supple.  CHEST:  Clear to auscultation, S1 and S2 regular.  ABDOMEN:  Soft with normal bowel sounds.  DESCRIPTION OF PROCEDURE:  The patient was placed in the left lateral decubitus position and sedated with 50 mg of Demerol and 5 mg of Versed intravenously.  Once the patient was adequately sedated, and maintained on low flow oxygen and continuous cardiac monitoring, the Olympus video panendoscope was advanced through the mouth piece over the tongue into the esophagus under direct vision.  The entire esophagus appeared normal with no evidence of ring, stricture, masses, esophagitis, or Barretts esophagus.  The scope was then advanced into the stomach.  There was moderate antral gastritis.  No erosions, ulcerations, masses, or polyps were seen.  The proximal small bowel appeared normal.  No evidence of hiatal hernia was noted on retroflexion.  IMPRESSION:  Antral gastritis, otherwise normal  esophagogastroduodenoscopy.  RECOMMENDATIONS:  Proceed with a colonoscopy at this time. Dictated by:   Anselmo Rod, M.D. Attending Physician:  Charna Elizabeth DD:  04/08/02 TD:  04/11/02 Job: [id number]

## 2010-10-19 NOTE — Progress Notes (Signed)
Summary: Refill/gh  Phone Note Refill Request Message from:  Fax from Pharmacy on April 07, 2010 1:48 PM  Refills Requested: Medication #1:  PROTONIX 40 MG TBEC Thake one tablet at bedtime by mouth   Last Refilled: 02/01/2010 Last office visit was 07/2009.   Method Requested: Electronic Initial call taken by: Angelina Ok RN,  April 07, 2010 1:48 PM  Follow-up for Phone Call        needs ov. Follow-up by: Julaine Fusi  DO,  April 07, 2010 2:38 PM    Prescriptions: PROTONIX 40 MG TBEC (PANTOPRAZOLE SODIUM) Thake one tablet at bedtime by mouth  #31 x 0   Entered and Authorized by:   Julaine Fusi  DO   Signed by:   Julaine Fusi  DO on 04/07/2010   Method used:   [facility] [facility] [address] [id number]       [address]       [place], [place]  [address]       Ph: [phone]       Fax: [phone]   RxID:   [id number]

## 2010-10-19 NOTE — Progress Notes (Signed)
Summary: med refill/gp  Phone Note Refill Request Message from:  Fax from Pharmacy on October 24, 2009 1:53 PM  Refills Requested: Medication #1:  PLAVIX 75 MG TABS Take 1 tablet by mouth once a day   Last Refilled: 09/25/2009 Last appy. 07/27/09 .    Next appt.is 11/23/09 w/Dr. Logan Bores   Method Requested: Electronic Initial call taken by: Chinita Pester RN,  October 24, 2009 1:54 PM    Prescriptions: PLAVIX 75 MG TABS (CLOPIDOGREL BISULFATE) Take 1 tablet by mouth once a day  #30 x 11   Entered and Authorized by:   Zoila Shutter MD   Signed by:   Zoila Shutter MD on 10/25/2009   Method used:   [facility]        [facility] [facility] [address] [phone]ail[facility]       [address]       [place], [place]  [address]       Ph: [phone]       Fax: [phone]   RxID:   [id number]

## 2010-10-19 NOTE — Progress Notes (Signed)
Summary: med refill/gp  Phone Note Refill Request Message from:  Fax from Pharmacy on September 26, 2009 12:25 PM  Refills Requested: Medication #1:  CLONAZEPAM 1 MG TABS Take 2 tablets by mouth once a day at bedtime   Last Refilled: 08/28/2009  Method Requested: Electronic Initial call taken by: Chinita Pester RN,  September 26, 2009 12:25 PM  Follow-up for Phone Call        Looking at the last clinic note it appears [name] is on Clonazepam for depression.  Will refill to follow-up appointment.  Please schedule Ms. Pease to see Dr. Logan Bores within the next 2 months.  Thank You. Follow-up by: Doneen Poisson MD,  September 26, 2009 12:59 PM  Additional Follow-up for Phone Call Additional follow up Details #1::        Flag sent to [facility] for an appt. Additional Follow-up by: Chinita Pester RN,  September 26, 2009 1:45 PM    Prescriptions: CLONAZEPAM 1 MG TABS (CLONAZEPAM) Take 2 tablets by mouth once a day at bedtime  #60 x 2   Entered and Authorized by:   Doneen Poisson MD   Signed by:   Doneen Poisson MD on 09/26/2009   Method used:   Printed then faxed to ...       [facility] [address] [id number]       [address]       [place], [place]  [address]       Ph: [phone]       Fax: [phone]   RxID:   [id number]

## 2010-10-19 NOTE — Procedures (Signed)
Summary: EGD   EGD  Procedure date:  01/23/2006  Findings:      [facility]   Patient Name: [name], [name]. MRN:  Procedure Procedures: Panendoscopy (EGD) CPT: 43235.  Personnel: Endoscopist: Rachael Fee, MD.  Exam Location: Exam performed in Outpatient Clinic. Outpatient  Patient Consent: Procedure, Alternatives, Risks and Benefits discussed, consent obtained, from patient. Consent was obtained by the RN.  Indications Symptoms: Dysphagia.  Comments: mild, intermittent solid food dysphagia History  Current Medications: Patient is not currently taking Coumadin.  Pre-Exam Physical: Performed Jan 23, 2006  Cardio-pulmonary exam, Abdominal exam, Mental status exam WNL.  Comments: Pt. history reviewed/updated, physical exam performed prior to initiation of sedation? yes Exam Exam Info: Maximum depth of insertion Duodenum, intended Duodenum. Patient position: on left side. Gastric retroflexion performed. Images taken. ASA Classification: II. Tolerance: good.  Sedation Meds: Patient assessed and found to be appropriate for moderate (conscious) sedation. Residual sedation present from prior procedure today. Cetacaine Spray 1 sprays given aerosolized.  Monitoring: BP and pulse monitoring done. Oximetry used. Supplemental O2 given  Findings - Normal: Proximal Esophagus to Duodenal 2nd Portion. Comments: Otherwise normal examination.  OTHER FINDING: tortuous but otherwise normal esophagus. in Distal Esophagus.   Assessment Abnormal examination, see findings above.  Comments: Slightly tortuous distal esophagus, no strictures. Comments: This is likely the cause of her intermittent dysphagia.  Her symptoms are mild and she's gaining weight.  Would observe clinically for now. Events  Unplanned Intervention: No unplanned interventions were required.  Unplanned Events: There were no complications. Plans Comments: Chew food well.  Return  as needed. Disposition: After procedure patient sent to recovery. After recovery patient sent home.  This report was created from the original endoscopy report, which was reviewed and signed by the above listed endoscopist.

## 2010-10-19 NOTE — Cardiovascular Report (Signed)
Summary: Office Visit Remote   Office Visit Remote   Imported By: Roderic Ovens 03/17/2010 16:35:01  _____________________________________________________________________  External Attachment:    Type:   Image     Comment:   External Document

## 2010-10-19 NOTE — Progress Notes (Signed)
Summary: med refill/gp  Phone Note Refill Request Message from:  Fax from Pharmacy on May 04, 2010 10:37 AM  Refills Requested: Medication #1:  AMBIEN 10 MG TABS Take 1/2 tab by mouth at bedtime as needed for sleep   Last Refilled: 10/18/2009 Last appt.07/2009.  Flag sent to [facility] for an appt.   Method Requested: Telephone to Pharmacy Initial call taken by: Chinita Pester RN,  May 04, 2010 10:37 AM  Follow-up for Phone Call        AGree that pt needs appt. No showed last 2 appt. Follow-up by: Blanch Media MD,  May 04, 2010 11:20 AM  Additional Follow-up for Phone Call Additional follow up Details #1::        [facility] Additional Follow-up by: Chinita Pester RN,  May 04, 2010 2:07 PM    Prescriptions: AMBIEN 10 MG TABS (ZOLPIDEM TARTRATE) Take 1/2 tab by mouth at bedtime as needed for sleep, if  not effective in 30 minutes take and additional half tab once.  #16 x 0   Entered and Authorized by:   Blanch Media MD   Signed by:   Blanch Media MD on 05/04/2010   Method used:   Telephoned to ...       [facility] [address] [facility]       [address]       [place], [place]  [address]       Ph: [phone]       Fax: [phone]   RxID:   [id number]

## 2010-10-19 NOTE — Procedures (Signed)
Summary: Colonoscopy  Patient: [name] Note: All result statuses are Final unless otherwise noted.  Tests: (1) Colonoscopy (COL)   COL Colonoscopy           DONE     [facility]     [address]     [place], [place]  [address]           COLONOSCOPY PROCEDURE REPORT           PATIENT:  [name], [name]  MR#:  [id number]     BIRTHDATE:  April 16, 1946, 63 yrs. old  GENDER:  female     ENDOSCOPIST:  Iva Boop, MD, [facility]           PROCEDURE DATE:  04/28/2010     PROCEDURE:  Incomplete colonoscopy     ASA CLASS:  Class III     INDICATIONS:  anemia, microcytic, suspected to be chronic blood     loss     MEDICATIONS:   Fentanyl 100 mcg IV, Versed 10 mg IV, Benadryl 25     mg IV           DESCRIPTION OF PROCEDURE:   After the risks benefits and     alternatives of the procedure were thoroughly explained, informed     consent was obtained.  Digital rectal exam was performed and     revealed no abnormalities.   The EC-[id number] ([id number]) endoscope was     introduced through the anus and advanced to the sigmoid colon,     limited by extreme patient discomfort, a tortuous colon.    The     quality of the prep was excellent, using MiraLax.  The instrument     was then slowly withdrawn as the colon was fully examined.     <<PROCEDUREIMAGES>>           FINDINGS:  Normal rectum.  Normal colon otherwise in the sigmoid     colon but there was angulation and together with significant     discomfort, this led to discontinuing the procedure.   Retroflexed     views in the rectum revealed no abnormalities.    The scope was     then withdrawn from the patient and the procedure completed.           COMPLICATIONS:  None     ENDOSCOPIC IMPRESSION:     1) Normal rectum     2) Normal in the sigmoid colon to mid-sigmoid where there was     angulation amd stenosis.     3) Incomplete exam     RECOMMENDATIONS:     EGD next.           Iva Boop, MD, [facility]           n.   eSIGNED:   Iva Boop at 04/28/2010 12:57 PM           [place], [id number]  Note: An exclamation mark (!) indicates a result that was not dispersed into the flowsheet. Document Creation Date: 04/28/2010 12:58 PM _______________________________________________________________________  (1) Order result status: Final Collection or observation date-time: 04/28/2010 12:49 Requested date-time:  Receipt date-time:  Reported date-time:  Referring Physician:   Ordering Physician: Stan Head [phone]) Specimen Source:  Source: Launa Grill Order Number: [id number] Lab site:

## 2010-10-19 NOTE — Cardiovascular Report (Signed)
Summary: Office Visit Remote   Office Visit Remote   Imported By: Roderic Ovens 12/15/2009 11:55:34  _____________________________________________________________________  External Attachment:    Type:   Image     Comment:   External Document

## 2010-10-19 NOTE — Progress Notes (Signed)
Summary: Refill/gh  Phone Note Refill Request Message from:  Fax from Pharmacy on October 20, 2009 9:45 AM  Refills Requested: Medication #1:  VITAMIN D 42595 UNIT  CAPS Take 1 capsule by mouth once a week   Last Refilled: 09/23/2009 Last visit was 07/27/2009.  Last labs were 06/01/2009   Method Requested: Electronic Initial call taken by: Angelina Ok RN,  October 20, 2009 9:45 AM  Follow-up for Phone Call        Please have patient's PCP review the patient's history of Vitamin D deficiency and decide on an appropriate maintenance dose.  She appears to been on high-dose weekly supplementation for at least 2 months. Follow-up by: Margarito Liner MD,  October 20, 2009 10:56 AM  Additional Follow-up for Phone Call Additional follow up Details #1::        Next visit will need to check patient's vitamin D level. Script for 1000 units daily written and sent to patient's pharmacy electronically. Future order entered. Additional Follow-up by: Nilda Riggs MD,  October 20, 2009 8:44 PM    New/Updated Medications: VITAMIN D 1000 UNIT TABS (CHOLECALCIFEROL) Take 1 tablet by mouth once a day Prescriptions: VITAMIN D 1000 UNIT TABS (CHOLECALCIFEROL) Take 1 tablet by mouth once a day  #30 x 6   Entered and Authorized by:   Nilda Riggs MD   Signed by:   Nilda Riggs MD on 10/20/2009   Method used:   [facility]        [facility] [phone]       [address]       [place], [place]  [address]       Ph: [phone]       Fax: [phone]   RxID:   [id number]

## 2010-10-19 NOTE — Progress Notes (Signed)
----   Converted from flag ---- ---- 09/28/2009 4:30 PM, Chinita Pester RN wrote:   ---- 09/28/2009 4:29 PM, Chinita Pester RN wrote: Thanks  ---- 09/28/2009 3:48 PM, Shon Hough wrote: SCh has not been posed yet will save this flag until sch is posted.  ---- 09/26/2009 1:44 PM, Chinita Pester RN wrote: [name], [name] needs an appt. w/PCP within next 2 months per Dr. Josem Kaufmann  Thanks ------------------------------

## 2010-10-19 NOTE — Procedures (Signed)
Summary: Upper Endoscopy  Patient: [name] Note: All result statuses are Final unless otherwise noted.  Tests: (1) Upper Endoscopy (EGD)   EGD Upper Endoscopy       DONE     [facility]     [address]     [place], [place]  [address]           ENDOSCOPY PROCEDURE REPORT           PATIENT:  [name], [name]  MR#:  [id number]     BIRTHDATE:  1945-09-22, 63 yrs. old  GENDER:  female           ENDOSCOPIST:  Iva Boop, MD, [facility]           PROCEDURE DATE:  04/28/2010     PROCEDURE:  EGD, diagnostic     ASA CLASS:  Class III     INDICATIONS:  anemia microcytic, suspect from chronic blood loss           MEDICATIONS:   There was residual sedation effect present from     prior procedure., Versed 2 mg IV     TOPICAL ANESTHETIC:  none           DESCRIPTION OF PROCEDURE:   After the risks benefits and     alternatives of the procedure were thoroughly explained, informed     consent was obtained.  The [id number] ([id number]) endoscope was     introduced through the mouth and advanced to the second portion of     the duodenum, limited by patient discomfort.   The instrument was     slowly withdrawn as the mucosa was fully examined.     <<PROCEDUREIMAGES>>           The upper, middle, and distal third of the esophagus were     carefully inspected and no abnormalities were noted. The z-line     was well seen at the GEJ. The endoscope was pushed into the fundus     which was normal including a retroflexed view. The antrum,gastric     body, first and second part of the duodenum were unremarkable.     Some mucoid adherent material in esophagus that flushes.     Retroflexed views revealed no abnormalities.    The scope was then     withdrawn from the patient and the procedure completed.           COMPLICATIONS:  None           ENDOSCOPIC IMPRESSION:     1) Normal EGD     RECOMMENDATIONS:     FUTURE ENDOSCOPIC STUDIES WILL PROBABLY REQUIRE DEEP SEDATION OR  GENERAL ANESTHESIA. SHE DID NOT TOLERATE THIS WELL AND COULD NOT     COMPLETE COLONOSCOPY.           Will not pursue further work-up as we have not proven blood loss     and her comorbidities limit abilities to do so also. i.e.     risk-benefit ration does not favor pursuing further work-up in my     opinion. Anemia could be nutritional also.     She has had iv iron and should also try to take po iron.     Follow-up GI as needed.           Iva Boop, MD, [facility]           n.     eSIGNED:   Maryjean Morn.  Gessner at 04/28/2010 01:15 PM           [name], [name], [id number]  Note: An exclamation mark (!) indicates a result that was not dispersed into the flowsheet. Document Creation Date: 04/28/2010 1:17 PM _______________________________________________________________________  (1) Order result status: Final Collection or observation date-time: 04/28/2010 13:07 Requested date-time:  Receipt date-time:  Reported date-time:  Referring Physician:   Ordering Physician: Stan Head [phone]) Specimen Source:  Source: Launa Grill Order Number: [id number] Lab site:

## 2010-10-19 NOTE — Progress Notes (Signed)
Summary: med refill/gp  Phone Note Refill Request Message from:  Fax from Pharmacy on December 05, 2009 10:46 AM  Refills Requested: Medication #1:  PREMARIN 0.625 MG  TABS Take one tablet daily.   Last Refilled: 11/02/2009 Last appt. 11.10.10.   Method Requested: Electronic Initial call taken by: Chinita Pester RN,  December 05, 2009 10:46 AM  Follow-up for Phone Call        Reviewed chart.  Last saw Dr Logan Bores on 07/27/09 and he requested a three month F/U.  Pt has no scheduled appts in system.  Refill request for premarin.  Has been on this since at least 12/17/06 when the EMR was started.  A note on 10/15/07 indicated that it had been started for hot flushes.  Pt now on for at least 3 years.  Probably time to taper and see if can can tolerate D/C of med to decrease risk of long term hormone tx.   Follow-up by: Blanch Media MD,  December 05, 2009 11:24 AM    Prescriptions: PREMARIN 0.625 MG  TABS (ESTROGENS CONJUGATED) Take one tablet daily.  #32 x 2   Entered and Authorized by:   Blanch Media MD   Signed by:   Blanch Media MD on 12/05/2009   Method used:   [facility]        [facility] [facility]       [address]       [place], [place]  [address]       Ph: [phone]       Fax: [phone]   RxID:   [id number]

## 2010-10-19 NOTE — Progress Notes (Signed)
Summary: pt following with Dr. Amil Amen  Phone Note Outgoing Call Call back at [facility] Phone [phone]   Caller: Patient Reason for Call: Talk to Nurse, Talk to Doctor Summary of Call: pt is following with Dr. Amil Amen. He has a machine for home checks dose she have to send that back to use or dose she keep it. Initial call taken by: Omer Jack,  March 30, 2010 8:52 AM Summary of Call: lmtc for pt--returning call. Vella Kohler  March 30, 2010 4:10 PM  Follow-up for Phone Call        ok. Follow-up by: Laren Boom, MD, [facility],  April 28, 2010 5:43 PM

## 2010-10-19 NOTE — Procedures (Signed)
Summary: EGD   EGD  Procedure date:  03/20/2006  Findings:      Location: [facility]   NAME:  [name], [name]                ACCOUNT NO.:  [id number]   MEDICAL RECORD NO.:  [id number]          PATIENT TYPE:  EMS   LOCATION:  MAJO                         FACILITY:  [facility]   PHYSICIAN:  Georgiana Spinner, M.D.    DATE OF BIRTH:  July 12, 1946   DATE OF PROCEDURE:  03/20/2006  DATE OF DISCHARGE:                                 OPERATIVE REPORT   PROCEDURE:  Upper endoscopy with foreign body removal.   ANESTHESIA:  Demerol 25, Versed 12 mg.   PROCEDURE:  With the patient mildly sedated in the left lateral decubitus  position, the Olympus videoscopic endoscope was inserted into the mouth. The  patient was (too much static)           ______________________________  Georgiana Spinner, M.D.     GMO/MEDQ  D:  03/20/2006  T:  03/20/2006  Job:  [id number]

## 2010-10-19 NOTE — Procedures (Signed)
Summary: colon   Colonoscopy  Procedure date:  01/23/2006  Findings:      Location:  [facility].   Patient Name: [name], [name]. MRN:  Procedure Procedures: Colonoscopy CPT: [phone].    with polypectomy. CPT: A3573898.  Personnel: Endoscopist: Rachael Fee, MD.  Exam Location: Exam performed in Outpatient Clinic. Outpatient  Patient Consent: Procedure, Alternatives, Risks and Benefits discussed, consent obtained, from patient. Consent was obtained by the RN.  Indications  Surveillance of: Adenomatous Polyp(s).  History  Current Medications: Patient is not currently taking Coumadin.  Pre-Exam Physical: Performed Jan 23, 2006. Cardio-pulmonary exam, Abdominal exam, Mental status exam WNL.  Comments: Pt. history reviewed/updated, physical exam performed prior to initiation of sedation? yes Exam Exam: Extent of exam reached: Cecum, extent intended: Cecum.  The cecum was identified by appendiceal orifice and IC valve. Patient position: on left side. Colon retroflexion performed. Images taken. ASA Classification: II. Tolerance: fair, adequate exam.  Monitoring: Pulse and BP monitoring, Oximetry used. Supplemental O2 given.  Colon Prep Prep results: good.  Sedation Meds: Patient assessed and found to be appropriate for moderate (conscious) sedation. Fentanyl 150 mcg. given IV. Versed 15 given IV.  Findings POLYP: Descending Colon, Maximum size: 10 mm. sessile polyp. Procedure:  snare with cautery, removed, retrieved, Polyp sent to pathology. Path # 1. ICD9: Colon Polyps: 211.3.  - NORMAL EXAM: Cecum to Rectum. Comments: Otherwise normal examination.  - HEMORRHOIDS: External. Size: Medium. ICD9: Hemorrhoids, External: 455.3.   Assessment Abnormal examination, see findings above.  Diagnoses: 211.3: Colon Polyps.  455.3: Hemorrhoids, External.   Comments: Single colon polyp, no cancers. Events  Unplanned Interventions: No intervention was  required.  Unplanned Events: There were no complications. Plans Comments: She will need surveillance colonoscopy in 3 years, sooner if pathology is concerning. Disposition: After procedure patient sent to recovery. After recovery patient sent home.  This report was created from the original endoscopy report, which was reviewed and signed by the above listed endoscopist.

## 2010-10-19 NOTE — Procedures (Signed)
Summary: Colon   Colonoscopy  Procedure date:  04/08/2002  Findings:      Location:  [facility].                     [facility]  Patient:    [name], [name] Visit Number: [id number] MRN: [id number]          Service Type: END Location: ENDO Attending Physician:  Charna Elizabeth Dictated by:   Anselmo Rod, M.D. Proc. Date: 04/08/02 Admit Date:  04/08/2002 Discharge Date: 04/08/2002   CC:         Arvella Merles, M.D.   Procedure Report  DATE OF BIRTH:  09-Feb-2046  PROCEDURE:  Colonoscopy with biopsies.  ENDOSCOPIST:  Anselmo Rod, M.D.  INSTRUMENT:  Pediatric adjustable Olympus colonoscope.  INDICATIONS FOR PROCEDURE:  This is a 65 year old white female with a questionable family history of colon cancer to rule out colonic polyps, masses, hemorrhoids, etc.  PREPROCEDURE PREPARATION:  Informed consent was secured from the patient.  The patient was fasted for eight hours prior to the procedure and prepped with a bottle of magnesium citrate and one gallon of NuLytely the night prior to the procedure.  PREPROCEDURE PHYSICAL:  The patient had stable vital signs.  Neck is supple. The chest is clear to auscultation.  Abdomen is soft with normal bowel sounds.  DESCRIPTION OF PROCEDURE:  The patient was placed in the left lateral decubitus position and sedated with an additional 6.25 mg of Demerol and 5 mg of Versed intravenously.  Once the patient was adequately sedated and maintained on low flow oxygen and continuous cardiac monitoring, the Olympus video colonoscope was advanced from the rectum to the cecum with difficulty. There was a large amount of residual in the colon.  Multiple washings were done.  The procedure was completed to the cecum.  The appendiceal orifice and ileocecal valve were clearly visualized and photographed.  A small patch of erythema was seen at 30 cm.  This was biopsied for pathology.  Small internal hemorrhoids  were seen on retroflexion.  Due to some residual stool in the colon, very small lesions could have been missed.  IMPRESSION: 1. Patchy erythema at 30 cm biopsied for pathology. 2. Swollen non-bleeding internal hemorrhoids. 3. Significant amount of residual stool in the colon.  RECOMMENDATIONS: 1. Await pathology results. 2. Outpatient follow-up in the next 7-10 days for further    recommendations. Dictated by:   Anselmo Rod, M.D. Attending Physician:  Charna Elizabeth DD:  04/08/02 TD:  04/11/02 Job: [id number]

## 2010-10-19 NOTE — Letter (Signed)
Summary: Remote Device Check  [employer], Main Office  [address]   [place], [place] [address]   Phone: [phone]  Fax: [phone]     December 13, 2009 MRN: [id number]   CHAUNCEY BRUNO [address] [place], [place]  [address]   Dear Ms. Olheiser,   Your remote transmission was recieved and reviewed by your physician.  All diagnostics were within normal limits for you.  __X___Your next transmission is scheduled for: February 27, 2010.  Please transmit at any time this day.  If you have a wireless device your transmission will be sent automatically.     Sincerely,  Proofreader

## 2010-10-19 NOTE — Letter (Signed)
Summary: Remote Device Check  [facility] [employer], Main Office  [address]   [place], [place] [address]   Phone: [phone]  Fax: [phone]     March 16, 2010 MRN: [id number]   [name] [address] [place], [place]  [address]   Dear [name],   Your remote transmission was recieved and reviewed by your physician.  All diagnostics were within normal limits for you.   __X____Your next office visit is scheduled for:  September 2011 with Dr Ladona Ridgel.                             Please call our office to schedule an appointment.    Sincerely,  Vella Kohler

## 2010-10-19 NOTE — Procedures (Signed)
Summary: EGD   EGD  Procedure date:  03/28/2006  Findings:      Location: [facility]   NAME:  [name], [name]                ACCOUNT NO.:  [id number]   MEDICAL RECORD NO.:  [id number]          PATIENT TYPE:  AMB   LOCATION:  ENDO                         FACILITY[facility]  [name]   PHYSICIAN:  Georgiana Spinner, M.D.    DATE OF BIRTH:  02-18-1946   DATE OF PROCEDURE:  DATE OF DISCHARGE:  02/06/2006                                 OPERATIVE REPORT   PROCEDURE:  Upper endoscopy with foreign body removal.   ANESTHESIA:  Demerol 125 mg, Versed 12 mg was given.   PROCEDURE:  With the patient mildly sedated in the left lateral decubitus  position, the Olympus videoscopic endoscope was inserted in the mouth, and  the patient was somewhat combative with each time we had to intubate her,  but we were able to pass the endoscope into the mouth and advance distally,  and found a large foreign body of meat in the distal esophagus.  First using  the [employer], we attempted to ensnare this, but were able to  break off only pieces of it, which we removed through the mouth.  We  reinserted the endoscope and switched to using a tripod three-pronged  instrument to grasp this meat, and subsequently we were able to grasp enough  of this, in the center of it, to pull it out.  There was some hesitation as  it came through the upper esophageal sphincter, but we were able to, in  fact, keep it in the tripod and remove it.  Subsequently, it was  approximately the length of my thumb, which is probably almost 2 inches  long, and we retrieved it whole.  We then subsequently reinserted the  endoscope into the mouth and passed distally.  There was minimal irritation  at the level of the distal esophagus, where the food had been lodged, but we  were able to advance easily the endoscope into the stomach.  Fundus, body,  antrum, duodenal bulb, second portion of duodenum were visualized.  From  this  point the endoscope was slowly withdrawn, taking circumferential views  of duodenal mucosa until the endoscope had been pulled back into the  stomach, placed in retroflexion to view the stomach from below.  The  endoscope was then straightened and withdrawn.  The patient's vital signs  and pulse oximeter remained stable.  The patient tolerated procedure well  without apparent complications.   FINDINGS:  Dysphagia with steak lodged in the distal esophagus, removed by  endoscopic means.   PLAN:  Strongly cautioned the patient about better chewing technique and/or  cutting of her meats especially in the future, and also discussed this with  family members present to help avoid a recurrence of this clinical problem  Other follow-up is as arranged and suggested by Dr. Christella Hartigan           ______________________________  Georgiana Spinner, M.D.     GMO/MEDQ  D:  03/28/2006  T:  03/28/2006  Job:  [id number]  cc:   Rachael Fee, M.D.

## 2010-10-19 NOTE — Assessment & Plan Note (Signed)
Review of gastrointestinal problems: 1. adenomatous colon polyps, colonoscopy by Dr. Christella Hartigan may, 2007 10 mm adenomatous polyp was removed. Colonoscopy 2003 by Dr. Loreta Ave adenomatous polyps were also removed    History of Present Illness Visit Type: Follow-up Visit Primary GI MD: Rob Bunting MD Primary Provider: Holley Bouche, MD Chief Complaint: Colon consult, rectal bleed History of Present Illness:     she had about a year of loose stools, daily, watery.  Non-bloody.  She was given align samples and that helped. she is back to two BMs a day.  she had 3 days limited, minor rectal bleeding when her loose stools subsided but that has stopped. She is very happy with her bowels now.  She had STEMI about one year ago.  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  She thinks she had stent placed as well.  She understands that she has another blockage as well.  she has an EF of 20-25% per recent cardiology note in EMR (Dr. Ladona Ridgel).           Current Medications (verified): 1)  Protonix 40 Mg Tbec (Pantoprazole Sodium) .... Thake One Tablet At Bedtime By Mouth 2)  Clonazepam 1 Mg Tabs (Clonazepam) .... Take 2 Tablets By Mouth Once A Day At Bedtime 3)  Ambien 10 Mg Tabs (Zolpidem Tartrate) .... Take 1/2 Tab By Mouth At Bedtime As Needed For Sleep, If  Not Effective in 30 Minutes Take and Additional Half Tab Once. 4)  Crestor 20 Mg Tabs (Rosuvastatin Calcium) .... Take One Tablet By Mouth Daily. 5)  Aspir-Trin 325 Mg Tbec (Aspirin) .... Take One Tablet By Mouth At Bedtime Prior To Niaspan. 6)  Klor-Con M20 20 Meq Cr-Tabs (Potassium Chloride Crys Cr) .... Take 1 Tablet By Mouth Once A Day 7)  Metoprolol Tartrate 25 Mg Tabs (Metoprolol Tartrate) .... Take 1 Tablet By Mouth Once A Day 8)  Ramipril 2.5 Mg Caps (Ramipril) .... Take 1 Tablet By Mouth Once A Day 9)  Premarin 0.625 Mg  Tabs (Estrogens Conjugated) .... Take One Tablet Daily. 10)  Plavix 75 Mg Tabs (Clopidogrel Bisulfate) .... Take 1  Tablet By Mouth Once A Day 11)  Furosemide 40 Mg Tabs (Furosemide) .... Take 1 Tablet By Mouth Once A Day 12)  Vitamin D 1000 Unit Tabs (Cholecalciferol) .... Take 1 Tablet By Mouth Once A Day 13)  Vicodin 5-500 Mg Tabs (Hydrocodone-Acetaminophen) .... As Needed 14)  Requip 0.25 Mg Tabs (Ropinirole Hydrochloride) .... Take 1 Tablets By Mouth At Bedtime 15)  Nitroglycerin 0.4 Mg Subl (Nitroglycerin) .... One Tablet Under Tongue Every 5 Minutes As Needed For Chest Pain---May Repeat Times Three 16)  Fish Oil   Oil (Fish Oil) .... Take One Tablet By Mouth Twice Daily. 17)  Tylenol 325 Mg Tabs (Acetaminophen) .... As Needed 18)  Mirtazapine 15 Mg Tabs (Mirtazapine) .... Take 1 Tablet At Bedtime  Allergies (verified): 1)  ! Morphine  Vital Signs:  Patient profile:   65 year old female Height:      64.5 inches Weight:      134 pounds BMI:     22.73 Pulse rate:   80 / minute Pulse rhythm:   regular BP sitting:   114 / 80  (left arm) Cuff size:   regular  Vitals Entered By: June McMurray CMA Duncan Dull) (November 01, 2009 2:06 PM)  Physical Exam  Additional Exam:  Constitutional: Chronically ill appearing, slightly dyspneic on exam Psychiatric: alert and oriented times 3 Abdomen: soft, non-tender, non-distended, normal bowel sounds trace  to 1+ lower extremity edema    Impression & Recommendations:  Problem # 1:  personal history of colon polyps, recent diarrhea resolved, recent rectal bleeding resolved she has an ejection fraction between 20-25% from ischemic cardiomyopathy. She is on Plavix for significant coronary artery disease and stenting.  she has baseline dyspnea. I do not think that polyp surveillance is necessarily a very relevanty issue for her anymore and so I do not thinkthat we should proceed with colonoscopy at this point. She knows however to get in touch if she has any significant changes in her bowels, bleeding.  Patient Instructions: 1)  No plans for colonoscopy for polyp  surveilance at this point. 2)  A copy of this information will be sent to Dr. Holley Bouche, Dr. Corliss Marcus. 3)  If significant new GI symptoms occur (bleeding, new changes in bowel habits) please call Dr. Christella Hartigan. 4)  The medication list was reviewed and reconciled.  All changed / newly prescribed medications were explained.  A complete medication list was provided to the patient / caregiver.

## 2010-10-19 NOTE — Progress Notes (Signed)
Summary: refill/gg  Phone Note Refill Request  on October 12, 2009 11:48 AM  Refills Requested: Medication #1:  AMBIEN 5 MG  TABS take 1/2 tablet by mouth before bedtime. Can take another 1/2 tablet in 30 min if still not asleep.   Last Refilled: 09/13/2009 refill is for ambien 10 mg with same directions  Initial call taken by: Merrie Roof RN,  October 12, 2009 11:49 AM  Follow-up for Phone Call        Will change to 10 with same instructions Follow-up by: Julaine Fusi  DO,  October 12, 2009 12:02 PM  Additional Follow-up for Phone Call Additional follow up Details #1::        Rx faxed to pharmacy Additional Follow-up by: Merrie Roof RN,  October 18, 2009 9:30 AM    New/Updated Medications: AMBIEN 10 MG TABS (ZOLPIDEM TARTRATE) Take 1/2 tab by mouth at bedtime as needed for sleep, if  not effective in 30 minutes take and additional half tab once. Prescriptions: AMBIEN 10 MG TABS (ZOLPIDEM TARTRATE) Take 1/2 tab by mouth at bedtime as needed for sleep, if  not effective in 30 minutes take and additional half tab once.  #30 x 0   Entered by:   Julaine Fusi  DO   Authorized by:   Marland Kitchen OPC ATTENDING DESKTOP   Signed by:   Julaine Fusi  DO on 10/12/2009   Method used:   Telephoned to ...       [facility]  [address] [id number] (retail[facility]       [address]       [place], [place]  [address]       Ph: [phone]       Fax: [phone]   RxID:   [id number]

## 2010-10-19 NOTE — Progress Notes (Signed)
Summary: med refill/gp  Phone Note Refill Request Message from:  Fax from Pharmacy on December 19, 2009 3:57 PM  Refills Requested: Medication #1:  CLONAZEPAM 1 MG TABS Take 2 tablets by mouth once a day at bedtime   Dosage confirmed as above?Dosage Confirmed   Brand Name Necessary? No   Supply Requested: 1 month   Last Refilled: 11/20/2009  Method Requested: Electronic Initial call taken by: Chinita Pester RN,  December 19, 2009 3:58 PM  Follow-up for Phone Call        Refill approved-nurse to complete Follow-up by: Nilda Riggs MD,  December 20, 2009 8:59 AM  Additional Follow-up for Phone Call Additional follow up Details #1::        Dr Logan Bores can you put in the new rx for this med. with the refill?  thanks Additional Follow-up by: Chinita Pester RN,  December 20, 2009 11:56 AM    Additional Follow-up for Phone Call Additional follow up Details #2::    [facility] Follow-up by: Chinita Pester RN,  December 23, 2009 2:16 PM  Prescriptions: CLONAZEPAM 1 MG TABS (CLONAZEPAM) Take 2 tablets by mouth once a day at bedtime  #60 x 0   Entered and Authorized by:   Nilda Riggs MD   Signed by:   Nilda Riggs MD on 12/22/2009   Method used:   Telephoned to ...       [facility]  [address] [id number]       [address]       [place], [place]  [address]       Ph: [phone]       Fax: [phone]   RxID:   [id number]

## 2010-11-01 ENCOUNTER — Other Ambulatory Visit: Payer: Self-pay | Admitting: *Deleted

## 2010-11-01 NOTE — Telephone Encounter (Signed)
No chart in [facility].  No visits in past year.  How is she our patient?

## 2010-11-14 NOTE — Telephone Encounter (Signed)
Pt was made inactive in 2011 in [facility], will close request

## 2010-11-22 ENCOUNTER — Encounter (INDEPENDENT_AMBULATORY_CARE_PROVIDER_SITE_OTHER): Payer: Medicare Other

## 2010-11-22 DIAGNOSIS — Z48812 Encounter for surgical aftercare following surgery on the circulatory system: Secondary | ICD-10-CM

## 2010-11-22 DIAGNOSIS — I739 Peripheral vascular disease, unspecified: Secondary | ICD-10-CM

## 2010-11-29 NOTE — Procedures (Unsigned)
BYPASS GRAFT EVALUATION  INDICATION:  Followup of bilateral lower extremity bypass grafts and a left femoral-to-right femoral bypass graft.  HISTORY: Diabetes:  No. Cardiac:  No. Hypertension:  Yes. Smoking:  Current. Previous Surgery:  Left-to-right fem-fem graft on 04/07/2009, right fem- to-popliteal bypass graft on 04/23/2005, left superficial femoral artery to posterior tibial artery bypass graft on 10/07/2000.  SINGLE LEVEL ARTERIAL EXAM                              RIGHT              LEFT Brachial:                    137                134 Anterior tibial:             118                124 Posterior tibial:            125                128 Peroneal: Ankle/brachial index:        0.91               0.93  PREVIOUS ABI:  Date: 05/23/2010  RIGHT:  0.95  LEFT:  0.95  LOWER EXTREMITY BYPASS GRAFT DUPLEX EXAM:  DUPLEX: 1. Biphasic Doppler waveforms present throughout the bilateral lower     extremity arterial system, including multiple bypass grafts. 2. Left peak systolic velocity of 428 cm/s identified at native distal     external iliac artery/common femoral artery segment and may suggest     a stenosis of >75%.   IMPRESSION: 1. Patent left common femoral artery to right common femoral artery     bypass graft. 2. Patent right common femoral artery to popliteal artery bypass     graft. 3. Patent left superficial femoral artery to posterior tibial artery     bypass graft. 4. Left peak systolic velocity of 428 cm/s identified at the native     distal external iliac artery/common femoral artery segment and may     suggest a stenosis of >75%. 5. Bilateral ankle brachial indices are in the mild claudication     range; however, are only minimally decreased from previous study on     05/23/2010.  ___________________________________________ Quita Skye. Hart Rochester, M.D.  SH/MEDQ  D:  11/22/2010  T:  11/22/2010  Job:  [id number]

## 2010-12-01 LAB — CARDIAC PANEL(CRET KIN+CKTOT+MB+TROPI)
CK, MB: 0.8 ng/mL (ref 0.3–4.0)
CK, MB: 0.8 ng/mL (ref 0.3–4.0)
Relative Index: INVALID (ref 0.0–2.5)
Total CK: 17 U/L (ref 7–177)
Troponin I: 0.03 ng/mL (ref 0.00–0.06)
Troponin I: 0.04 ng/mL (ref 0.00–0.06)

## 2010-12-01 LAB — CBC
HCT: 29 % — ABNORMAL LOW (ref 36.0–46.0)
HCT: 35.3 % — ABNORMAL LOW (ref 36.0–46.0)
HCT: 36.7 % (ref 36.0–46.0)
HCT: 37.7 % (ref 36.0–46.0)
Hemoglobin: 10.2 g/dL — ABNORMAL LOW (ref 12.0–15.0)
Hemoglobin: 10.8 g/dL — ABNORMAL LOW (ref 12.0–15.0)
MCH: 21 pg — ABNORMAL LOW (ref 26.0–34.0)
MCH: 21.5 pg — ABNORMAL LOW (ref 26.0–34.0)
MCHC: 28.6 g/dL — ABNORMAL LOW (ref 30.0–36.0)
MCV: 68.1 fL — ABNORMAL LOW (ref 78.0–100.0)
MCV: 74.7 fL — ABNORMAL LOW (ref 78.0–100.0)
Platelets: 337 10*3/uL (ref 150–400)
RBC: 4.86 MIL/uL (ref 3.87–5.11)
RDW: 19.9 % — ABNORMAL HIGH (ref 11.5–15.5)
RDW: 22.1 % — ABNORMAL HIGH (ref 11.5–15.5)
RDW: 23.5 % — ABNORMAL HIGH (ref 11.5–15.5)
WBC: 11.4 10*3/uL — ABNORMAL HIGH (ref 4.0–10.5)
WBC: 11.8 10*3/uL — ABNORMAL HIGH (ref 4.0–10.5)
WBC: 12.4 10*3/uL — ABNORMAL HIGH (ref 4.0–10.5)

## 2010-12-01 LAB — DIFFERENTIAL
Basophils Relative: 1 % (ref 0–1)
Eosinophils Absolute: 0.1 10*3/uL (ref 0.0–0.7)
Eosinophils Relative: 1 % (ref 0–5)
Monocytes Absolute: 1.1 10*3/uL — ABNORMAL HIGH (ref 0.1–1.0)
Monocytes Relative: 11 % (ref 3–12)
Neutrophils Relative %: 70 % (ref 43–77)
Neutrophils Relative %: 77 % (ref 43–77)

## 2010-12-01 LAB — HEMOGLOBIN AND HEMATOCRIT, BLOOD
HCT: 33.3 % — ABNORMAL LOW (ref 36.0–46.0)
HCT: 33.7 % — ABNORMAL LOW (ref 36.0–46.0)
Hemoglobin: 6.7 g/dL — CL (ref 12.0–15.0)
Hemoglobin: 9.7 g/dL — ABNORMAL LOW (ref 12.0–15.0)

## 2010-12-01 LAB — COMPREHENSIVE METABOLIC PANEL
ALT: 8 U/L (ref 0–35)
AST: 18 U/L (ref 0–37)
Albumin: 2.8 g/dL — ABNORMAL LOW (ref 3.5–5.2)
Alkaline Phosphatase: 130 U/L — ABNORMAL HIGH (ref 39–117)
Alkaline Phosphatase: 139 U/L — ABNORMAL HIGH (ref 39–117)
BUN: 29 mg/dL — ABNORMAL HIGH (ref 6–23)
Chloride: 102 mEq/L (ref 96–112)
GFR calc Af Amer: 37 mL/min — ABNORMAL LOW (ref >60)
Glucose, Bld: 96 mg/dL (ref 70–99)
Potassium: 3.9 mEq/L (ref 3.5–5.1)
Potassium: 4.3 mEq/L (ref 3.5–5.1)
Sodium: 139 mEq/L (ref 135–145)
Total Bilirubin: 0.4 mg/dL (ref 0.3–1.2)
Total Protein: 6.8 g/dL (ref 6.0–8.3)
Total Protein: 7.4 g/dL (ref 6.0–8.3)

## 2010-12-01 LAB — BASIC METABOLIC PANEL
BUN: 25 mg/dL — ABNORMAL HIGH (ref 6–23)
CO2: 24 mEq/L (ref 19–32)
CO2: 26 mEq/L (ref 19–32)
Calcium: 9.2 mg/dL (ref 8.4–10.5)
Calcium: 9.6 mg/dL (ref 8.4–10.5)
Glucose, Bld: 118 mg/dL — ABNORMAL HIGH (ref 70–99)
Glucose, Bld: 121 mg/dL — ABNORMAL HIGH (ref 70–99)
Sodium: 136 mEq/L (ref 135–145)
Sodium: 137 mEq/L (ref 135–145)

## 2010-12-01 LAB — CROSSMATCH: ABO/RH(D): B NEG

## 2010-12-07 ENCOUNTER — Telehealth: Payer: Self-pay | Admitting: Internal Medicine

## 2010-12-07 NOTE — Telephone Encounter (Signed)
Records received from [facility] via [place] ( Delivered) gave to [facility]

## 2010-12-24 LAB — CBC
Hemoglobin: 10 g/dL — ABNORMAL LOW (ref 12.0–15.0)
Hemoglobin: 8.2 g/dL — ABNORMAL LOW (ref 12.0–15.0)
MCHC: 31.7 g/dL (ref 30.0–36.0)
MCHC: 33 g/dL (ref 30.0–36.0)
MCV: 74 fL — ABNORMAL LOW (ref 78.0–100.0)
MCV: 77.1 fL — ABNORMAL LOW (ref 78.0–100.0)
Platelets: 300 10*3/uL (ref 150–400)
RBC: 3.95 MIL/uL (ref 3.87–5.11)
RDW: 16.7 % — ABNORMAL HIGH (ref 11.5–15.5)
WBC: 10.8 10*3/uL — ABNORMAL HIGH (ref 4.0–10.5)
WBC: 16 10*3/uL — ABNORMAL HIGH (ref 4.0–10.5)

## 2010-12-24 LAB — COMPREHENSIVE METABOLIC PANEL
AST: 16 U/L (ref 0–37)
Albumin: 3.2 g/dL — ABNORMAL LOW (ref 3.5–5.2)
Calcium: 9.8 mg/dL (ref 8.4–10.5)
Creatinine, Ser: 1.48 mg/dL — ABNORMAL HIGH (ref 0.4–1.2)
GFR calc Af Amer: 43 mL/min — ABNORMAL LOW (ref >60)
Total Protein: 7.5 g/dL (ref 6.0–8.3)

## 2010-12-24 LAB — BASIC METABOLIC PANEL
BUN: 12 mg/dL (ref 6–23)
CO2: 23 mEq/L (ref 19–32)
CO2: 25 mEq/L (ref 19–32)
Calcium: 8.5 mg/dL (ref 8.4–10.5)
Chloride: 101 mEq/L (ref 96–112)
Chloride: 104 mEq/L (ref 96–112)
GFR calc Af Amer: 55 mL/min — ABNORMAL LOW (ref >60)
Glucose, Bld: 113 mg/dL — ABNORMAL HIGH (ref 70–99)
Potassium: 4 mEq/L (ref 3.5–5.1)
Sodium: 134 mEq/L — ABNORMAL LOW (ref 135–145)
Sodium: 136 mEq/L (ref 135–145)

## 2010-12-24 LAB — CARDIAC PANEL(CRET KIN+CKTOT+MB+TROPI)
CK, MB: 1.3 ng/mL (ref 0.3–4.0)
CK, MB: 1.3 ng/mL (ref 0.3–4.0)
CK, MB: 1.4 ng/mL (ref 0.3–4.0)
Relative Index: INVALID (ref 0.0–2.5)
Troponin I: 0.03 ng/mL (ref 0.00–0.06)

## 2010-12-24 LAB — CROSSMATCH: Antibody Screen: NEGATIVE

## 2010-12-24 LAB — URINALYSIS, ROUTINE W REFLEX MICROSCOPIC
Nitrite: NEGATIVE
Specific Gravity, Urine: 1.014 (ref 1.005–1.030)
Urobilinogen, UA: 0.2 mg/dL (ref 0.0–1.0)
pH: 5 (ref 5.0–8.0)

## 2010-12-24 LAB — BRAIN NATRIURETIC PEPTIDE: Pro B Natriuretic peptide (BNP): 688 pg/mL — ABNORMAL HIGH (ref 0.0–100.0)

## 2010-12-24 LAB — APTT: aPTT: 26 seconds (ref 24–37)

## 2010-12-27 ENCOUNTER — Other Ambulatory Visit: Payer: Self-pay | Admitting: Internal Medicine

## 2011-01-02 LAB — CBC
HCT: 27 % — ABNORMAL LOW (ref 36.0–46.0)
HCT: 31.5 % — ABNORMAL LOW (ref 36.0–46.0)
Hemoglobin: 9.6 g/dL — ABNORMAL LOW (ref 12.0–15.0)
Hemoglobin: 9.9 g/dL — ABNORMAL LOW (ref 12.0–15.0)
MCHC: 34 g/dL (ref 30.0–36.0)
MCHC: 33 g/dL (ref 30.0–36.0)
MCHC: 33.5 g/dL (ref 30.0–36.0)
MCV: 86.3 fL (ref 78.0–100.0)
MCV: 85.4 fL (ref 78.0–100.0)
MCV: 85.8 fL (ref 78.0–100.0)
Platelets: 152 10*3/uL (ref 150–400)
Platelets: 267 10*3/uL (ref 150–400)
Platelets: 274 10*3/uL (ref 150–400)
Platelets: 433 10*3/uL — ABNORMAL HIGH (ref 150–400)
Platelets: 535 10*3/uL — ABNORMAL HIGH (ref 150–400)
RBC: 3.45 MIL/uL — ABNORMAL LOW (ref 3.87–5.11)
RDW: 16.9 % — ABNORMAL HIGH (ref 11.5–15.5)
RDW: 17.6 % — ABNORMAL HIGH (ref 11.5–15.5)
RDW: 17.4 % — ABNORMAL HIGH (ref 11.5–15.5)
RDW: 17.6 % — ABNORMAL HIGH (ref 11.5–15.5)
RDW: 17.9 % — ABNORMAL HIGH (ref 11.5–15.5)
WBC: 12.6 10*3/uL — ABNORMAL HIGH (ref 4.0–10.5)
WBC: 19.8 10*3/uL — ABNORMAL HIGH (ref 4.0–10.5)
WBC: 23.7 10*3/uL — ABNORMAL HIGH (ref 4.0–10.5)
WBC: 13.1 10*3/uL — ABNORMAL HIGH (ref 4.0–10.5)

## 2011-01-02 LAB — URINALYSIS, ROUTINE W REFLEX MICROSCOPIC
Bilirubin Urine: NEGATIVE
Glucose, UA: NEGATIVE mg/dL
Hgb urine dipstick: NEGATIVE
Ketones, ur: NEGATIVE mg/dL
Leukocytes, UA: NEGATIVE
Leukocytes, UA: NEGATIVE
Nitrite: NEGATIVE
Protein, ur: NEGATIVE mg/dL
Specific Gravity, Urine: 1.005 (ref 1.005–1.030)
Specific Gravity, Urine: <1.005 — ABNORMAL LOW (ref 1.005–1.030)
Specific Gravity, Urine: 1.011 (ref 1.005–1.030)
Urobilinogen, UA: 1 mg/dL (ref 0.0–1.0)
Urobilinogen, UA: 0.2 mg/dL (ref 0.0–1.0)
pH: 5 (ref 5.0–8.0)
pH: 5.5 (ref 5.0–8.0)

## 2011-01-02 LAB — BASIC METABOLIC PANEL
BUN: 16 mg/dL (ref 6–23)
BUN: 19 mg/dL (ref 6–23)
BUN: 25 mg/dL — ABNORMAL HIGH (ref 6–23)
BUN: 28 mg/dL — ABNORMAL HIGH (ref 6–23)
CO2: 23 mEq/L (ref 19–32)
CO2: 20 mEq/L (ref 19–32)
CO2: 28 mEq/L (ref 19–32)
CO2: 28 mEq/L (ref 19–32)
CO2: 28 mEq/L (ref 19–32)
Calcium: 8.7 mg/dL (ref 8.4–10.5)
Calcium: 9.2 mg/dL (ref 8.4–10.5)
Calcium: 9.6 mg/dL (ref 8.4–10.5)
Chloride: 99 mEq/L (ref 96–112)
Chloride: 101 mEq/L (ref 96–112)
Chloride: 97 mEq/L (ref 96–112)
Chloride: 98 mEq/L (ref 96–112)
Chloride: 99 mEq/L (ref 96–112)
Creatinine, Ser: 1.34 mg/dL — ABNORMAL HIGH (ref 0.4–1.2)
Creatinine, Ser: 1.69 mg/dL — ABNORMAL HIGH (ref 0.4–1.2)
Creatinine, Ser: 1.44 mg/dL — ABNORMAL HIGH (ref 0.4–1.2)
Creatinine, Ser: 1.49 mg/dL — ABNORMAL HIGH (ref 0.4–1.2)
Creatinine, Ser: 1.61 mg/dL — ABNORMAL HIGH (ref 0.4–1.2)
GFR calc Af Amer: 37 mL/min — ABNORMAL LOW (ref >60)
GFR calc Af Amer: 49 mL/min — ABNORMAL LOW (ref >60)
GFR calc Af Amer: 41 mL/min — ABNORMAL LOW (ref >60)
GFR calc Af Amer: 43 mL/min — ABNORMAL LOW (ref >60)
GFR calc non Af Amer: 27 mL/min — ABNORMAL LOW (ref >60)
GFR calc non Af Amer: 40 mL/min — ABNORMAL LOW (ref >60)
Glucose, Bld: 146 mg/dL — ABNORMAL HIGH (ref 70–99)
Glucose, Bld: 91 mg/dL (ref 70–99)
Potassium: 4.2 mEq/L (ref 3.5–5.1)
Potassium: 4.7 mEq/L (ref 3.5–5.1)
Potassium: 5.9 mEq/L — ABNORMAL HIGH (ref 3.5–5.1)
Sodium: 128 mEq/L — ABNORMAL LOW (ref 135–145)
Sodium: 133 mEq/L — ABNORMAL LOW (ref 135–145)
Sodium: 135 mEq/L (ref 135–145)
Sodium: 136 mEq/L (ref 135–145)

## 2011-01-02 LAB — DIFFERENTIAL
Basophils Absolute: 0.1 10*3/uL (ref 0.0–0.1)
Basophils Relative: 0 % (ref 0–1)
Basophils Relative: 1 % (ref 0–1)
Eosinophils Absolute: 0.1 10*3/uL (ref 0.0–0.7)
Eosinophils Relative: 1 % (ref 0–5)
Eosinophils Relative: 1 % (ref 0–5)
Monocytes Absolute: 1 10*3/uL (ref 0.1–1.0)
Monocytes Absolute: 0.7 10*3/uL (ref 0.1–1.0)
Monocytes Relative: 4 % (ref 3–12)
Monocytes Relative: 6 % (ref 3–12)
Neutro Abs: 19.5 10*3/uL — ABNORMAL HIGH (ref 1.7–7.7)

## 2011-01-02 LAB — URINE MICROSCOPIC-ADD ON

## 2011-01-02 LAB — COMPREHENSIVE METABOLIC PANEL
ALT: 228 U/L — ABNORMAL HIGH (ref 0–35)
ALT: 26 U/L (ref 0–35)
ALT: 22 U/L (ref 0–35)
AST: 231 U/L — ABNORMAL HIGH (ref 0–37)
AST: 278 U/L — ABNORMAL HIGH (ref 0–37)
AST: 19 U/L (ref 0–37)
Albumin: 2.4 g/dL — ABNORMAL LOW (ref 3.5–5.2)
Albumin: 2.4 g/dL — ABNORMAL LOW (ref 3.5–5.2)
Albumin: 3 g/dL — ABNORMAL LOW (ref 3.5–5.2)
Alkaline Phosphatase: 104 U/L (ref 39–117)
Alkaline Phosphatase: 121 U/L — ABNORMAL HIGH (ref 39–117)
Alkaline Phosphatase: 126 U/L — ABNORMAL HIGH (ref 39–117)
Alkaline Phosphatase: 144 U/L — ABNORMAL HIGH (ref 39–117)
BUN: 24 mg/dL — ABNORMAL HIGH (ref 6–23)
CO2: 25 mEq/L (ref 19–32)
Calcium: 8.8 mg/dL (ref 8.4–10.5)
Chloride: 102 mEq/L (ref 96–112)
Chloride: 98 mEq/L (ref 96–112)
Chloride: 98 mEq/L (ref 96–112)
Creatinine, Ser: 1.57 mg/dL — ABNORMAL HIGH (ref 0.4–1.2)
GFR calc Af Amer: 27 mL/min — ABNORMAL LOW (ref >60)
GFR calc Af Amer: 59 mL/min — ABNORMAL LOW (ref >60)
GFR calc non Af Amer: 23 mL/min — ABNORMAL LOW (ref >60)
Glucose, Bld: 104 mg/dL — ABNORMAL HIGH (ref 70–99)
Glucose, Bld: 113 mg/dL — ABNORMAL HIGH (ref 70–99)
Potassium: 3.7 mEq/L (ref 3.5–5.1)
Potassium: 4.4 mEq/L (ref 3.5–5.1)
Potassium: 3.7 mEq/L (ref 3.5–5.1)
Sodium: 127 mEq/L — ABNORMAL LOW (ref 135–145)
Sodium: 134 mEq/L — ABNORMAL LOW (ref 135–145)
Sodium: 136 mEq/L (ref 135–145)
Total Bilirubin: 0.3 mg/dL (ref 0.3–1.2)
Total Bilirubin: 0.4 mg/dL (ref 0.3–1.2)
Total Protein: 5.2 g/dL — ABNORMAL LOW (ref 6.0–8.3)
Total Protein: 5.7 g/dL — ABNORMAL LOW (ref 6.0–8.3)
Total Protein: 7.5 g/dL (ref 6.0–8.3)

## 2011-01-02 LAB — PROTIME-INR
INR: 1.1 (ref 0.00–1.49)
Prothrombin Time: 15 seconds (ref 11.6–15.2)

## 2011-01-02 LAB — POCT I-STAT, CHEM 8
BUN: 10 mg/dL (ref 6–23)
Calcium, Ion: 1.1 mmol/L — ABNORMAL LOW (ref 1.12–1.32)
Chloride: 106 mEq/L (ref 96–112)
HCT: 37 % (ref 36.0–46.0)
Potassium: 3.5 mEq/L (ref 3.5–5.1)

## 2011-01-02 LAB — BRAIN NATRIURETIC PEPTIDE
Pro B Natriuretic peptide (BNP): 1095 pg/mL — ABNORMAL HIGH (ref 0.0–100.0)
Pro B Natriuretic peptide (BNP): >3200 pg/mL — ABNORMAL HIGH (ref 0.0–100.0)

## 2011-01-02 LAB — CARDIAC PANEL(CRET KIN+CKTOT+MB+TROPI)
CK, MB: 297 ng/mL — ABNORMAL HIGH (ref 0.3–4.0)
CK, MB: 740.6 ng/mL — ABNORMAL HIGH (ref 0.3–4.0)
CK, MB: 4.6 ng/mL — ABNORMAL HIGH (ref 0.3–4.0)
Relative Index: 13.7 — ABNORMAL HIGH (ref 0.0–2.5)
Relative Index: 8.3 — ABNORMAL HIGH (ref 0.0–2.5)
Total CK: 3583 U/L — ABNORMAL HIGH (ref 7–177)
Total CK: 5423 U/L — ABNORMAL HIGH (ref 7–177)
Troponin I: 60.09 ng/mL (ref 0.00–0.06)
Troponin I: >100 ng/mL (ref 0.00–0.06)
Troponin I: >100 ng/mL (ref 0.00–0.06)
Troponin I: 0.67 ng/mL (ref 0.00–0.06)
Troponin I: 0.67 ng/mL (ref 0.00–0.06)

## 2011-01-02 LAB — URINE CULTURE: Colony Count: 2000

## 2011-01-02 LAB — LIPID PANEL
HDL: 43 mg/dL (ref >39)
Triglycerides: 100 mg/dL (ref <150)
VLDL: 20 mg/dL (ref 0–40)

## 2011-01-02 LAB — APTT: aPTT: 25 seconds (ref 24–37)

## 2011-01-02 LAB — TYPE AND SCREEN
ABO/RH(D): B NEG
Antibody Screen: NEGATIVE

## 2011-01-02 LAB — TSH: TSH: 4.164 u[IU]/mL (ref 0.350–4.500)

## 2011-01-02 LAB — PLATELET COUNT: Platelets: 312 10*3/uL (ref 150–400)

## 2011-01-02 LAB — TROPONIN I: Troponin I: 0.72 ng/mL (ref 0.00–0.06)

## 2011-01-02 LAB — CK TOTAL AND CKMB (NOT AT ARMC): CK, MB: 4.4 ng/mL — ABNORMAL HIGH (ref 0.3–4.0)

## 2011-01-15 ENCOUNTER — Encounter: Payer: Self-pay | Admitting: Internal Medicine

## 2011-01-18 ENCOUNTER — Encounter: Payer: Medicare Other | Admitting: Internal Medicine

## 2011-01-30 NOTE — Cardiovascular Report (Signed)
[name]                ACCOUNT NO.:  [id number]   MEDICAL RECORD NO.:  [id number]          PATIENT TYPE:  INP   LOCATION:  2914                         FACILITY:  [facility]   PHYSICIAN:  Francisca December, M.D.  DATE OF BIRTH:  16-Oct-1945   DATE OF PROCEDURE:  10/19/2008  DATE OF DISCHARGE:                            CARDIAC CATHETERIZATION   PROCEDURES PERFORMED:  1. Left heart catheterization.  2. Left ventriculogram.  3. Coronary angiography.  4. Abdominal aortic angiography.  5. Percutaneous coronary intervention proximal and distal left      anterior descending artery.   INDICATIONS:  [name] is a 65 year old woman with known extensive  atherosclerotic disease both coronary and peripheral vascular.  She is  status post previous stent implant in the proximal right coronary.  She  has also had a fem-pop bypass by Dr. Hart Rochester in the remote past.  She is  brought to the hospital emergently from the [facility] after complaining  of chest pain via EMS.  ECG has documented the presence of an acute  anterior wall myocardial infarction.  She is to undergo catheter-based  revascularization at this time.   PROCEDURAL NOTE:  The patient was brought to Cardiac Catheterization  Laboratory directly from the ambulance.  Both groins were prepped and  draped in the usual sterile fashion.  It was noted she did not have a  right femoral pulse.  There was an adequate left femoral pulse.  Local  anesthesia was obtained with infiltration of 1% lidocaine.  A 6-French  catheter sheath was inserted percutaneously into the left femoral artery  utilizing an anterior approach over guiding J-wire.  Coronary  angiography was then performed using a 6-French #4 right Judkins  catheter.  Cineangiography of the right coronary artery was conducted in  LAO projection.  The 6-French catheter sheath was exchanged for a 6-  [place] #3.5 CLS guiding catheter.  Cineangiography of the left coronary  artery  was conducted in LAO and RAO projections.   We then proceeded with coronary intervention on a completely occluded  proximal LAD.  The patient received 50 units per kg of intravenous  heparin as well as a double bolus and constant infusion of Integrilin.  The resultant ACT was 195 seconds initially.  She then received an  additional 1200 units of heparin with the resultant ACT greater than 200  seconds.  The lesion was easily crossed using a 0.014 inch Luge  intracoronary guidewire.  Initial balloon dilatation was performed with  a 3.5/20 mm Scimed Apex intracoronary balloon.  This was inflated to 4  atmospheres for 60 seconds.  This did reestablish antegrade flow at  least as far as the second diagonal branch.  The guidewire was then  advanced into the more distal LAD which was quite tortuous.  This did  establish more distal flow into the LAD.  Because of significant  thrombus burden, the patient then had a ClearWay catheter placed at the  site of the proximal lesion and 14.8 mg of ReoPro were administered via  the ClearWay catheter.  This was removed and a 3.5/18-mm  AVID Vision  intracoronary stent was advanced into place, proximal lesion carefully  positioned and inflated to a peak pressure of 11 atmospheres for 55  seconds.  Subsequent followup post dilatation was performed with a  3.5/15-mm Scimed Quantum Maverick inflated to 18 atmospheres for 55  seconds.  These maneuvers resulted in excellent antegrade flow into this  distal vessel.  However, a subtotal focal stenosis was noted to be  persisting in the distal LAD.  It did not improve with intracoronary  nitroglycerin and several aliquots given.  The wire was pullback a short  distance such as the floppy segment was within the lesion.  This did not  improve the situation, therefore ruling out the possibility of wire  bias.  Balloon dilatation was then performed with this lesion using a  2.5/12-mm Voyager RX balloon catheter.  This  was carefully positioned  and inflated to 6 atmospheres for 90 seconds.  That balloon was  deflated, removed and a 2.75/12 mm AVID Xience drug-eluting stent was  advanced into place carefully positioned and deployed at peak pressure  of 9 atmospheres for 50 seconds.  Post dilatation was performed with a  2.75/8-mm Voyager [place] carefully positioned within the stent and inflated  to 18 atmospheres for 60 seconds.  The patient then required 400 mcg of  intracoronary nitroglycerin and 300 mcg of intracoronary verapamil to  resolve spasm of the distal end of the stent.  The guiding catheter and  guidewire were then removed.  A 30 degree RAO cine left ventriculogram  was performed utilizing a power injector.  Pressure was recorded with  the catheter both in the ascending aorta and in the left ventricle prior  to the LV gram.  36 mL were injected at 12 mL per second.  The catheter  was then withdrawn into the abdominal aorta and an AP projection  cineangiogram of the abdominal aorta and pelvic vessels were performed  again with a power injector.  30 mL were injected at 15 mL per second.  The left femoral arteriogram in the 45 degree LAO angulation via the  catheter sheath by hand injection documented adequate placement of the  stent above the bifurcation, however, there was diffuse atherosclerotic  disease within the vessel and the decision was made not to proceed with  percutaneous closure.  The sheath was therefore sutured into place.  The  patient was transported to the recovery area in stable condition.  It  should be noted that her severe chest pain present on admission was  largely resolved at the end of the procedure and her ST segments had  decreased in height but had not completely resolved.  She did remain  hemodynamically stable throughout the case.   HEMODYNAMICS:  Systemic arterial pressure was initially 217/93 with a  mean of 138 mmHg.  There was no systolic gradient across the  aortic  valve. The  left ventricular end-diastolic pressure was 20 mmHg  preventriculogram.   During the procedure, the blood pressure dropped to 143/71 and for a  short while did fall into the 80 mmHg range after administration of IV  metoprolol of 10 mg and the multiple intracoronary aliquots of  nitroglycerin.  She left the laboratory with a right femoral arterial  blood pressure of 110/80.   Angiography:  The left ventriculogram demonstrated normal chamber size  and severe left ventricular dysfunction.  There was anterolateral  dyskinesis which was mild apical akinesis and inferoapical akinesis.  The anterobasal segment and the inferobasal  segment were moving  adequately.  The calculated ejection fraction utilizing a single-plane  cine method was 23%.  There was 1+ mitral regurgitation at present.  The  aortic valve was trileaflet and opened normally during systole.  Left  and right coronary calcification was seen.   The abdominal aortogram demonstrated diffuse atherosclerotic disease of  the distal aorta.  There is a 30% stenosis at the origin of the right  renal artery.  There is a 70% stenosis in the midportion of the left  renal artery.  There is diffuse disease extending into the infrarenal  segment and down to the bifurcation into the common iliacs.  The camera  was panned favoring the right common iliac and femoral segment.  This  was seen to be occluded just after the origin of the common femoral.   There was a codominant system present.  The right coronary artery was  completely occluded at its origin where a previous stent had been  placed.  There was trivial antegrade flow.  It should be noted that the  stent prevented full cannulation of the artery.   The left main coronary artery showed no significant stenosis.  The left  anterior descending artery and its branches are highly diseased; the  vessel is completely occluded in the proximal second approximately 1 cm   from its origin.   The left circumflex coronary artery is widely patent and gives rise to a  large first marginal branch and a large true obtuse marginal branch as  well as a moderate-sized posterolateral branch.  There was a 30%  stenosis in the origin of the left circumflex.  There was a 30% stenosis  at the origin of the first marginal.  No other significant distal  disease or in the branches of the marginal are seen.  The posterolateral  branch reaches almost to the inferior septum if not actually perfusing  the proximal portion of the inferior septum.  There are left to right  collaterals seen into the distal right coronary and extending almost to  the acute margin.  No clear posterior descending artery can be seen.   Following balloon dilatation and stent implantation in the proximal  portion of the anterior descending artery, there was no residual  stenosis and a good step-up and step-down are seen at the proximal and  distal end of the stent respectively.  There was stent jail and  occlusion of a moderate-sized first septal perforator.  The distal  lesion was 90% stenotic prior to balloon dilatation and stent  implantation.  Following this, there was no residual stenosis and again  a good step-up and step-down were seen at the proximal and distal ends  of the stent.  Following intracoronary verapamil, the spasm at the  distal end was significantly relieved, however, there still remained a  persistent 30% stenosis.  Following restoration of antegrade flow in the  left anterior descending artery, there is visualization of the posterior  descending artery via the apical segment.   FINAL IMPRESSION:  1. Atherosclerotic coronary vascular disease, two-vessel.  2. Acute anterolateral, apical, and inferoapical wall myocardial      infarction.  3. Severe ischemic cardiomyopathy.  4. Completely occluded right coronary artery following a remote stent      implantation.  5. 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  6. Severe diffuse atherosclerotic disease in the distal aorta, common      iliacs and  into the femoral system bilaterally with complete      occlusion of the right femoral artery prior to the bifurcation.  7. Status post successful percutaneous coronary intervention with bare-      metal stent implant in the proximal left anterior descending and      drug-eluting stent in the distal left anterior descending.      Francisca December, M.D.  Electronically Signed     JHE/MEDQ  D:  10/20/2008  T:  10/21/2008  Job:  [id number]

## 2011-01-30 NOTE — Assessment & Plan Note (Signed)
OFFICE VISIT   [name], [name]  DOB:  [birth year]                                       11/15/2009  GUYQI#:[id number]   The patient has been followed on a regular basis for lower extremity  occlusive disease, most recently having a left to right femoral-femoral  bypass performed by me in July 2010 for severe claudication the right  leg.  That was completely relieved and she has bilateral functioning  vein grafts (right femoral popliteal and left superficial femoral to  posterior tibial) which we have followed for many years.  She states  that 11 days ago, on a Friday, she began having increasing pain in the  left leg which occurred primarily at rest and also was aggravated by  ambulating and decreased her ability to walk further than about 100 to  200 yards.  She does have a history of a ruptured disk, a year or so  ago, take care of by Dr. Ethelene Hal, but does not have leg symptoms at that  time.  She denies any numbness, nonhealing ulcers or infection in either  foot and states that her right leg claudication symptoms continue to be  relieved.  She was most recently seen in our office in February of this  year with an ABI of 1.0 on the left and 0.97 on the right.   CHRONIC STABLE MEDICAL PROBLEMS:  1. Hypertension.  2. Coronary artery disease.  3. GERD.  4. Lumbar disk disease.   REVIEW OF SYSTEMS:  She denies any chest pain, does have dyspnea on  exertion, no orthopnea.  Denies any hemoptysis, chronic cough but does  have sinus drainage periodically.  No GI, GU symptoms.  All other  systems are negative on review of systems.   FAMILY HISTORY:  Positive for coronary artery disease in her father;  stroke in her mother and sister.  Negative for diabetes.   SOCIAL HISTORY:  She is single.  Smokes 1/2 to 1 pack of cigarettes per  day for 50 years, temporarily quit 1 year ago, but started back.   PHYSICAL EXAMINATION:  Blood pressure 119/82, heart rate 70,  respirations 14, temperature of 98.  General:  She is a well-developed,  well-nourished female in no apparent distress, alert and oriented x3.  HEENT:  EOMs intact.  Chest:  Clear to auscultation.  Cardiovascular:  Regular rhythm, no murmurs.  Carotid pulses 3+ with no audible bruits.  Abdomen:  Soft, nontender with no masses.  Musculoskeletal:  Free of  deformities.  Neurologic:  Exam is normal.  Lower extremities:  Exam  reveals right leg to have a 3+ femoral, popliteal and dorsalis pedis  pulse.  The left leg has 3+ femoral, popliteal and 2+ posterior tibial  pulse.  Both feet are cool and slightly pale.  There is intact sensation  in the feet.   Today I ordered lower extremity arterial Dopplers and a scan of the left  lower extremity bypass graft which I have reviewed and interpreted.  The  left superficial femoral to posterior tibial graft is widely patent with  ABI of 1.04.  Right leg is 0.97.  There is increased velocity in the  left distal external iliac artery at 341 cm/sec at the level of the left-  right femoral-femoral graft.   We will continue to follow on protocol.  Lower extremity  arterial  disease is not the etiology of her symptoms and she is urged to see Dr.  Ethelene Hal for further evaluation of the lumbar disk disease if her symptoms  worsen or persist.     Quita Skye. Hart Rochester, M.D.  Electronically Signed   JDL/MEDQ  D:  11/15/2009  T:  11/16/2009  Job:  [id number]

## 2011-01-30 NOTE — Assessment & Plan Note (Signed)
OFFICE VISIT   HALSTON, FAIRCLOUGH  DOB:  06/20/1946                                       04/26/2009  WUJWJ#:[id number]   The patient returns today for her initial followup regarding her left to  right femoral-femoral bypass graft done by me on July 22 for severe  claudication of the right leg secondary to iliac occlusion.  She has  patent bilateral lower extremity bypass grafts (fem-pop on the right and  fem-tibial on the left).   Today she states that her claudication has been considerably relieved  and she has no symptoms of discomfort while walking.  She has well-  healed incisions on physical exam with excellent femoral-femoral graft  pulse and 2+ dorsalis pedis pulse.  ABI is 0.98 bilaterally.  She is  reassured regarding these findings and will continue to be followed on  the protocol for her femoral popliteal grafts.   Quita Skye Hart Rochester, M.D.  Electronically Signed   JDL/MEDQ  D:  04/26/2009  T:  04/27/2009  Job:  [id number]

## 2011-01-30 NOTE — Op Note (Signed)
[name], [name]                ACCOUNT NO.:  [id number]   MEDICAL RECORD NO.:  [id number]          PATIENT TYPE:  AMB   LOCATION:  SDS                          FACILITY:  [facility]   PHYSICIAN:  Corky Crafts, MDDATE OF BIRTH:  1946-06-22   DATE OF PROCEDURE:  02/03/2009  DATE OF DISCHARGE:  02/03/2009                               OPERATIVE REPORT   REFERRING PHYSICIAN:  Francisca December, MD   PROCEDURES PERFORMED:  1. Abdominal aortogram.  2. Bilateral lower extremity runoff.  3. Pelvic angiogram.   OPERATOR:  Corky Crafts, MD   INDICATIONS:  Right leg claudication.   PROCEDURE NARRATIVE:  The risks and benefits of angiography were  explained to the patient and informed consent was obtained.  She was  brought to the [facility] lab.  She was prepped and draped in the usual sterile  fashion.  Her left groin was infiltrated with 1% lidocaine.  A 5-French  sheath was placed into her left femoral artery using the modified  Seldinger technique.  A pigtail catheter was advanced via the abdominal  aorta.  A power injection of contrast was performed in the AP  projection.  The catheter was pulled down to the aortoiliac bifurcation  and power injection of contrast was performed to image both lower  extremities and LAO shot was then obtained for the pelvic angiogram with  a power injection of contrast.  The sheath was removed using manual  compression.   FINDINGS:  There was mild diffuse abdominal aortic atherosclerosis.  There was single renal artery to the left kidney and dual arterial  supply to the right kidney.  The right renal arteries were widely  patent.  The left renal has moderate atherosclerosis in the mid vessel  of approximately 40%.  The aortoiliac bifurcation appears widely patent.   Left leg:  The left common iliac artery is patent.  The left external  iliac artery stent is widely patent.  The left internal iliac artery is  patent.  The left common femoral artery  is patent.  The left profunda  femoral artery is patent.  There is a left femoral to tibial graft which  is widely patent.  There appears to be three-vessel runoff below the  knee.   Right leg:  The right common iliac artery is patent.  There is an 80%  proximal left internal iliac lesion.  The external iliac artery is  occluded proximally to just above the hip joint.  The graft from the  right superficial femoral artery to the right popliteal artery is  patent.  There is three-vessel runoff below the knee.  The peroneal  artery is diffusely diseased.  The right posterior tibial artery is a  dominant vessel.   IMPRESSION:  1. Long segment occlusion of the right external iliac artery with      collaterals to the distal right external iliac artery from the      right internal iliac artery.  Right external iliac artery      reconstitutes just above the hip joint.  2. Mild-to-moderate left renal artery stenosis.  3. Patent right femoral-to-popliteal graft.  The left femoral-to-      tibial graft also is patent.   RECOMMENDATIONS:  Consider fem-fem bypass.  I will speak to the vascular  surgeons regarding this.  Continue aggressive secondary prevention for  vascular disease.      Corky Crafts, MD  Electronically Signed     JSV/MEDQ  D:  02/03/2009  T:  02/04/2009  Job:  [id number]

## 2011-01-30 NOTE — H&P (Signed)
NAMEMADYSIN, [name]                ACCOUNT NO.:  [id number]   MEDICAL RECORD NO.:  [id number]          PATIENT TYPE:  INP   LOCATION:  2914                         FACILITY:  [facility]   PHYSICIAN:  Francisca December, M.D.  DATE OF BIRTH:  Mar 20, 1946   DATE OF ADMISSION:  10/19/2008  DATE OF DISCHARGE:                              HISTORY & PHYSICAL   CHIEF COMPLAINT:  Chest pain.   HISTORY OF PRESENT ILLNESS:  [name] is a 65 year old female with  known coronary artery disease who had a previous stent to the ostial  right coronary artery years ago.  She has had 3 days of intermittent  chest pain.  Today while walking, he appears to have developed an acute  substernal chest pain associated with nausea and vomiting.  EMS was  called and EKG showed acute ST segment  elevation in the anterior leads,  5 mm, with inferior ST segment depression.  She was brought to the cath  lab for urgent intervention.   PAST MEDICAL HISTORY:  1. Peripheral vascular disease, status post left fem.  2. Posterior tib bypass graft, 2002.  3. Right superficial femoral artery PDA.  4. Nonsaccular infrarenal abdominal aortic aneurysms.  5. Mild AI.  6. Mild MR, echo 2004.  7. Hypertension.  8. History of tobacco use.  9. Benign positional vertigo.  10.Postmenopausal.  11.Arthritis.  12.Gastritis.   SOCIAL HISTORY:  She lives in [place].  She is a former smoker.  Denies alcohol or illicit drug use.   FAMILY HISTORY:  Mother died of hypertension and peripheral vascular  disease.  Father died at age 23 of an MI.  She has a brother who has had  a coronary stent.   ALLERGIES:  No known drug allergies.   MEDICATIONS:  Benicar and aspirin.  She cannot really elaborate on the  medication of which she felt so bad.  She was unable to really  communicate with me.   In 2004, she was on the following medications:  1. Aspirin 325 mg a day.  2. Plavix 75 mg a day.  3. Premarin 0.625 mg a day.  4. Altace 5 mg  b.i.d.  5. Norvasc 5 mg b.i.d.  6. Atenolol 50 mg one-half tablet daily.  7. Sublingual nitroglycerin p.r.n.  8. Meclizine p.r.n.  9. Phenergan p.r.n.  10.Multivitamin daily.   REVIEW OF SYSTEMS:  As above otherwise negative.   PHYSICAL EXAMINATION:  VITAL SIGNS:  Pulse 105, respirations 16, blood  pressure 148/70, and O2 saturation 99% on room air.  GENERAL:  She is groaning in pain.  HEENT:  Grossly normal.  NECK:  No carotid bruits.  No JVD.  No thyromegaly.  Sclerae clear.  Conjunctivae normal.  Nares without drainage.  CHEST:  Clear to auscultation bilaterally.  No wheezing or rhonchi.  HEART:  Regular rate and rhythm.  No rubs or murmurs.  ABDOMEN:  Good bowel sounds.  Nontender and nondistended.  No mass.  No  bruits.  EXTREMITIES:  No peripheral edema.  SKIN:  Warm and dry.  NEUROLOGIC:  Cranial nerves II through XII grossly  intact.  Please note  that there is no right femoral pulse palpable.  She has normal mood and  affect.   Chest x-ray is pending.   LABORATORY DATA:  Hemoglobin 12.6 and hematocrit 37.  Sodium 136,  potassium 3.5, BUN 10, creatinine 1.1, and glucose 133.  EKG normal  sinus rhythm more than 5 mm ST segment elevation in the anterior leads  with ST depression in the inferior leads.   ASSESSMENT AND PLAN:  1. ST segment elevation myocardial infarction, anterior.  2. Hypertension.  3. Peripheral vascular disease.  4. Arthritis.  5. Gastritis.   The patient is being taken to the cath lab emergently.  The patient has  been seen and examined by Dr. Corliss Marcus.      [name], P.A.      Francisca December, M.D.  Electronically Signed   LB/MEDQ  D:  10/19/2008  T:  10/20/2008  Job:  [id number]   cc:   Holley Bouche, M.D.

## 2011-01-30 NOTE — H&P (Signed)
HISTORY AND PHYSICAL EXAMINATION   March 22, 2009   Re:  Anne, [name]                DOB:  05-27-1946   CHIEF COMPLAINT:  Severe discomfort right leg with ambulation.   HISTORY OF PRESENT ILLNESS:  This 65 year old female patient has a long  history of vascular occlusive disease including left femoral to  posterior tibial bypass grafting in 2002 by Dr. Hart [name] and a right  femoral-popliteal bypass grafting in 2006 by Dr. Hart [name].  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  She is scheduled for left to right femoral-femoral bypass grafting for  relief of symptoms.   PAST MEDICAL HISTORY:  1. Hypertension.  2. Coronary artery disease, previous PTA and stenting in February of      2010 with anterior myocardial infarction and insertion of two drug-      eluting stents and one bare metal stent at the proximal LAD and is      stable from that standpoint.  3. Gastroesophageal reflux disease.  4. Negative for stroke or diabetes.  5. History of arthritis.  6. History of lumbar disk disease with chronic back pain.   PAST SURGICAL HISTORY:  1. Resection cervical mass.  2. Bilateral tarsal tunnel release.  3. History of drainage left groin abscess.  4. Removal of benign breast mass.  5. Status post cholecystectomy.  6. Hysterectomy.  7. Right femoral-popliteal bypass graft.  8. Left femoral to posterior tibial bypass graft.   ALLERGIES:  None known.   MEDICATIONS:  The patient does not have her list with her today but it  does include Plavix 75 mg daily and aspirin 325 mg daily.   SOCIAL HISTORY:  The patient is married, has one child, has a 30 year  smoking history, stopped in February of 2010.  No alcohol use.   FAMILY HISTORY:  Brother is deceased, history of hypertension and  peripheral vascular disease in her mother, father positive for  coronary  artery disease, brother had a previous stent and lower extremity  occlusive disease.   REVIEW OF SYSTEMS:  The patient denies any active chest pain or dyspnea  on exertion.  Does have a history of dysphagia and has been evaluated at  [facility] for swallowing problems but has not had upper GI series  performed.  Has chronic back discomfort.  Right leg severe discomfort  with any ambulation.   PHYSICAL EXAM:  Blood pressure 101/60, heart rate 68, respirations 14.  In general she is a middle-aged female in no apparent distress, alert  and oriented x3.  Neck is supple, 3+ carotid pulses palpable.  No bruits  are audible.  Neurologic exam is normal.  No palpable adenopathy in the  neck.  Chest clear to auscultation.  Cardiovascular exam is regular  rhythm.  No murmurs.  Abdomen is soft, nontender with no masses.  Left  leg has 3+ femoral, popliteal and posterior tibial pulse.  Right leg has  absent femoral and distal pulses.  Both feet are adequately perfused.   ABI in the right leg is 0.48 and the left leg is 1.0.   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   PLAN:  Is to admit the patient on July 22 for the left to right femoral-  femoral bypass grafting.  The patient is stable from her coronary artery  disease having had PTCA and stenting performed in February of 2010 by  Dr. Eldridge Dace.   [name], M.D.  Electronically Signed   JDL/MEDQ  D:  03/22/2009  T:  03/23/2009  Job:  [id number]   cc:   Corky Crafts, MD

## 2011-01-30 NOTE — Assessment & Plan Note (Signed)
OFFICE VISIT   [name], [name]  DOB:  May 25, 1946                                       12/02/2009  ZOXWR#:[id number]   Patient contacted our clinic today complaining of a sore right great  toe.  She is a patient of Dr. Hart Rochester and has undergone a left-to-right  femoral-femoral bypass grafting performed on 04/07/2009.  She was  scheduled for an appointment.  Her hypertension and coronary artery  disease are stable at this time.  Family history is unchanged.  She was  positive for coronary artery disease and peripheral vascular disease in  her family.  Negative for diabetes.   SOCIAL HISTORY:  She is single and has a history of tobacco use.   REVIEW OF SYSTEMS:  All interrogatories were denied with the exception  of exquisite pain in her right great toe.   Physical findings revealed a very pleasant elderly woman in a wheelchair  in no distress complaining of a painful right great toe.  Heart rate was  86, blood pressure was 115/74, O2 saturation was 100%.  HEENT:  PERRLA,  EOMI.  Membranes were pink and moist.  Cardiac exam revealed a regular  rate and rhythm.  The abdomen was soft, nontender, nondistended.  Musculoskeletal disease exam demonstrated no major deformities.  Neurologic exam was nonfocal.  No ulcers or rashes were appreciated on  her skin.  The radial, dorsalis pedis, and posterior tibials were equal  and easily palpable bilaterally.  Her right great toe was evaluated.  It  was swollen.  It was exquisitely tender to touch.  It did feel as if  there was fluid around the first joint.   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  She does have a family history which is  significant for gout.  At this time, we will prescribe colchicine 0.6 mg  p.o. t.i.d. for 3 days, then daily.  She has also been requested to  obtain an appointment with Dr. Durwin Nora, her primary care  physician, on Monday for  continuing follow-up.   Wilmon Arms, PA   Larina Earthly, M.D.  Electronically Signed   KEL/MEDQ  D:  12/02/2009  T:  12/02/2009  Job:  [id number]   cc:   Holley Bouche, M.D.

## 2011-01-30 NOTE — Assessment & Plan Note (Signed)
OFFICE VISIT   ABERDEEN, HAFEN  DOB:  1946-09-17                                       03/23/2008  ZOXWR#:[id number]   The patient is status post bilateral femoral popliteal bypass grafts by  me many years ago.  The right leg was done in August of 2006, left leg  was done in January of 2002.  Both grafts continue to function nicely by  duplex scanning on the protocol.  She was referred today for left leg  symptoms with concerns of vascular insufficiency.  She also states that  she is having pain in both feet, which has been present since she had  procedures on the toes of both feet by her podiatrist.  She also states  a left leg is very weak when she starts walking and continues to become  more weak as she walks.  She has had no history of infection, ulceration  or gangrene of the feet.  She has no hemispheric or non-hemispheric  TIAs, amaurosis fugax, diplopia, blurred vision or syncope and denies  any chest pain, dyspnea on exertion, PND or orthopnea.   PHYSICAL EXAM:  Blood pressure 153/91, heart rate 67, respirations 14.  Carotid pulses 3+ with no audible bruits.  Neurologic:  Normal.  No  palpable adenopathy in the neck.  Chest:  Clear to auscultation.  Cardiovascular is regular rhythm no murmurs.  The soft, nontender with  no masses.  Both legs have 3+ femoral, popliteal, palpable pulse and  dorsalis pedis is 2+.  ABIs are greater than 1 in both legs and scans of  both grafts revealed vein graft to be widely patent.   I have reassured her regarding her circulation and the status of both  bypass grafts.  Her symptoms are not due to arterial insufficiency.  Will continue to follow on a regular basis for the vascular grafts.  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   Quita Skye Hart Rochester, M.D.  Electronically Signed   JDL/MEDQ  D:  03/23/2008  T:  03/24/2008  Job:  [id number]

## 2011-01-30 NOTE — Discharge Summary (Signed)
NAMEAMERIS, AKAMINE NO.:  [id number]   MEDICAL RECORD NO.:  [id number]          PATIENT TYPE:  INP   LOCATION:  2011                         FACILITY:  [facility]   PHYSICIAN:  Quita Skye. Hart Rochester, M.D.  DATE OF BIRTH:  03/05/1946   DATE OF ADMISSION:  04/07/2009  DATE OF DISCHARGE:  04/12/2009                               DISCHARGE SUMMARY   ADDENDUM:  [name] prior to her discharge on November 12, 2008,  developed substernal chest pain consistent with possible angina.  She  has experienced angina previously, however, we did feel that she should  be evaluated.  We asked her cardiologist to evaluate her.  Her enzymes  were negative.  She did rule out for myocardial infarction.  With  concurrence of her cardiologist, she was discharged on April 12, 2009.      Wilmon Arms, PA      Quita Skye Hart Rochester, M.D.  Electronically Signed    KEL/MEDQ  D:  04/12/2009  T:  04/13/2009  Job:  [id number]   cc:   Quita Skye. Hart Rochester, M.D.

## 2011-01-30 NOTE — Procedures (Signed)
BYPASS GRAFT EVALUATION   INDICATION:  Followup left lower extremity bypass graft.  Pain in the  left lower extremity (mostly at night or while lying down).   HISTORY:  Diabetes:  No.  Cardiac:  No.  Hypertension:  Yes.  Smoking:  Previous.  Previous Surgery:  Fem-fem bypass graft on 04/07/2009, right fem-pop  bypass graft on 04/23/2005, left superficial femoral to posterior tibial  artery bypass graft on 10/07/2000.   SINGLE LEVEL ARTERIAL EXAM                               RIGHT              LEFT  Brachial:                    127                126  Anterior tibial:             123                111  Posterior tibial:            122                132  Peroneal:  Ankle/brachial index:        0.97               1.04   PREVIOUS ABI:  Date:  10/21/2009  RIGHT:  0.95  LEFT:  1.06   LOWER EXTREMITY BYPASS GRAFT DUPLEX EXAM:   DUPLEX:  Biphasic Doppler waveforms noted throughout the left lower  extremity bypass graft and native vessels with an increased velocity of  341 cm/sec noted in the left distal external iliac / common femoral  artery area.   IMPRESSION:  1. Patent left superficial femoral to posterior tibial artery bypass      graft with an increased velocity noted as described above.  2. Stable bilateral ankle brachial indices.   ___________________________________________  Quita Skye Hart Rochester, M.D.   CH/MEDQ  D:  11/17/2009  T:  11/17/2009  Job:  [id number]

## 2011-01-30 NOTE — Procedures (Signed)
BYPASS GRAFT EVALUATION   INDICATION:  Lower extremity bypass grafts.   HISTORY:  Diabetes:  No  Cardiac:  MI, CHF  Hypertension:  Yes  Smoking:  Previous  Previous Surgery:  Right fem-pop bypass graft on 04/23/2005, left  superficial femoral to posterior tibial artery bypass graft on  10/07/2000.   SINGLE LEVEL ARTERIAL EXAM                               RIGHT              LEFT  Brachial:                    121                122  Anterior tibial:             48                 Not detected  Posterior tibial:            58                 125  Peroneal:                                       127  Ankle/brachial index:        0.48               1.04   PREVIOUS ABI:  Date: 03/23/2008  RIGHT:  Greater than 1.0  LEFT:  Greater than 1.0   LOWER EXTREMITY BYPASS GRAFT DUPLEX EXAM:   DUPLEX:  Dampened monophasic waveforms noted throughout the right lower  extremity bypass graft and its native vessels which is suggestive of  significant inflow disease.   IMPRESSION:  1. Patent right femoral popliteal bypass graft with no evidence of      stenosis, however right iliac artery disease is suggested.  2. Significant decrease of the right ankle brachial index noted when      compared to the previous exam with the left ankle brachial index      remaining stable.      ___________________________________________  Quita Skye. Hart Rochester, M.D.   CH/MEDQ  D:  03/22/2009  T:  03/22/2009  Job:  [id number]

## 2011-01-30 NOTE — Procedures (Signed)
BYPASS GRAFT EVALUATION   INDICATION:  Followup lower extremity revascularization.   HISTORY:  Diabetes:  No.  Cardiac:  MI, pacemaker.  Hypertension:  Yes.  Smoking:  Yes.  Previous Surgery:  Femoral-femoral (left to right) 04/07/2009, right  femoral-popliteal artery bypass graft 04/23/2005, left superficial  femoral artery to posterior tibial artery bypass graft 10/07/2000, all  by Dr. Hart Rochester.   SINGLE LEVEL ARTERIAL EXAM                               RIGHT              LEFT  Brachial:                    118                117  Anterior tibial:             112                120  Posterior tibial:            110                125  Peroneal:  Ankle/brachial index:        0.95               1.06   PREVIOUS ABI:  Date:  04/26/2009  RIGHT:  0.98  LEFT:  0.98   LOWER EXTREMITY BYPASS GRAFT DUPLEX EXAM:   DUPLEX:  Patent femoral-femoral bypass graft, right femoral-popliteal  bypass graft and left femoral-posterior tibial bypass graft with no  evidence of in graft stenosis.  Elevated velocities of 289 cm/s noted in left proximal common femoral  artery/distal external iliac artery.   IMPRESSION:  1. Bilateral ankle brachial indices appear within normal limits and      stable from previous study.  2. Patent left to right femoral-femoral artery bypass graft.  3. Patent right femoral-popliteal artery bypass graft.  4. Patent left superficial femoral artery to posterior tibial artery      bypass graft.  5. Elevated velocities in the left proximal common femoral      artery/distal external iliac artery of 289 cm/s.   ___________________________________________  Quita Skye. Hart Rochester, M.D.   AS/MEDQ  D:  10/21/2009  T:  10/21/2009  Job:  [id number]

## 2011-01-30 NOTE — Procedures (Signed)
BYPASS GRAFT EVALUATION   INDICATION:  Follow-up evaluation of bilateral lower extremity bypass  graft.  Patient complains of six months of left leg claudication and  tingling.  Patient recently began taking cholesterol medicine.   HISTORY:  Diabetes:  No.  Cardiac:  Coronary artery disease with stent in May, 2004.  Hypertension:  Yes.  Smoking:  One and a half packs per day for 40 years.  Previous Surgery:  Right fem-pop bypass graft with saphenous vein on  04/23/05 by Dr. Hart Rochester.  Left superficial femoral to posterior tibial  artery bypass graft with nonreversed saphenous vein on 10/07/00.   SINGLE LEVEL ARTERIAL EXAM                               RIGHT              LEFT  Brachial:                    152                164  Anterior tibial:             177                174  Posterior tibial:            162                166  Peroneal:  Ankle/brachial index:        >1.0               >1.0   PREVIOUS ABI:  Date: 04/18/07  RIGHT:  0.99  LEFT:  0.99   LOWER EXTREMITY BYPASS GRAFT DUPLEX EXAM:   DUPLEX:  Doppler arterial waveforms are triphasic proximal to, within,  and distal to the bypass grafts bilaterally.   IMPRESSION:  1. Ankle brachial indices are stable from previous study bilaterally.  2. Patent right femoropopliteal bypass graft.  3. Patent left superficial femoral to posterior tibial artery bypass      graft.   ___________________________________________  Quita Skye. Hart Rochester, M.D.   MC/MEDQ  D:  03/23/2008  T:  03/23/2008  Job:  [id number]

## 2011-01-30 NOTE — Discharge Summary (Signed)
NAMEBANA, BORGMEYER                ACCOUNT NO.:  [id number]   MEDICAL RECORD NO.:  [id number]          PATIENT TYPE:  INP   LOCATION:  3735                         FACILITY:  [facility]   PHYSICIAN:  Corky Crafts, MDDATE OF BIRTH:  [birth year]   DATE OF ADMISSION:  11/02/2008  DATE OF DISCHARGE:  11/06/2008                               DISCHARGE SUMMARY   DISCHARGE DIAGNOSES:  1. Congestive heart failure.  2. Ischemic cardiomyopathy, echocardiogram pending, but cardiac      catheterization recently revealing a diminished ejection fraction.  3. Coronary artery disease, status post left anterior descending      coronary artery myocardial infarction, bare-metal stent on October 20, 2008.  4. Hyperlipidemia.  5. Gastroesophageal reflux disease.  6. Peripheral vascular disease, status post left posterior tibial      bypass graft.  7. Mild aortic regurgitation.  8. Mild mitral regurgitation.  9. Benign positional vertigo.  10.Gastritis.  11.Arthritis.  12.Chronic leg pain.   HOSPITAL COURSE:  [name] is a 65 year old female who recently had  anterior myocardial infarction with two drug-eluting stents implanted  into her LAD.  She has an ischemic cardiomyopathy with an EF of 23%.  She was discharged from the hospital.  The patient has been short of  breath and fatigued and has remained in the bed since that time.  She  was seen in the office on November 01, 2008, and lab work obtained  included a BNP of 3050 and a chest x-ray concluding that she had heart  failure.  Due to her deteriorating status, we decided to admit her to  the hospital on November 02, 2008.   She was aggressively diuresed with IV Lasix.  She did have a small bump  in her creatinine up to 1.6, but at the time of this discharge, it had  decreased to 1.49.  Her body weight at this point, which is down to what  we think her dry weight is 60 kg.   The only other problem during her hospitalization is that  she was not  very active and we had to push her to perform any type of activity.  She  seemed very weak and frail.   DISCHARGE LABORATORIES:  Hemoglobin 9.9, hematocrit 29.6, white count  10, platelets 480.  Sodium 136, potassium 4.8, BUN 27, creatinine 1.49,  and glucose 91.   She did have an echocardiogram done. EF on catheterization during her  myocardial infarction was 23%.   DISCHARGE MEDICATIONS:  1. Enteric-coated aspirin 325 mg a day.  2. Plavix 75 mg a day.  3. Potassium 20 mEq day.  4. Lasix 40 mg twice a day.  5. Zoloft 50 mg.  6. Benicar one a day.  7. Hydrocodone p.r.n.  8. Premarin one a day.  9. Klonopin p.r.n.   The patient did not know her medications on admission and I have asked  her to bring all of her medications to her next visit, so we can sort  them out.  She is being discharged to home in stable condition.  She is  to remain on a low-sodium heart-healthy diet.  Increase activity slowly.  No lifting over 10 pounds for 1 week.  Activity as per cardiac  rehabilitation.  Return to see Dr. Stana Bunting, nurse  practitioner, on November 11, 2008, at 8:50 a.m.      [name], P.A.      Corky Crafts, MD  Electronically Signed    LB/MEDQ  D:  11/05/2008  T:  11/05/2008  Job:  [id number]   cc:   Melida Quitter, M.D.

## 2011-01-30 NOTE — Procedures (Signed)
BYPASS GRAFT EVALUATION   INDICATION:  Followup evaluation of lower extremity bypass grafts  bilaterally.   HISTORY:  Diabetes:  No  Cardiac:  Coronary artery disease with stent in May 2004  Hypertension:  Yes  Smoking:  1-1/2 packs per day x40 years  Previous Surgery:  Right fem-pop bypass graft with saphenous vein on  April 23, 2005, by Dr. Hart Rochester; left superficial femoral to posterior  tibial artery bypass graft with nonreverse saphenous vein on October 07, 2000, by Dr. Hart Rochester   SINGLE LEVEL ARTERIAL EXAM                               RIGHT              LEFT  Brachial:                    164                158  Anterior tibial:             163                154  Posterior tibial:            149                162  Peroneal:  Ankle/brachial index:        0.99               0.99   PREVIOUS ABI:  Date: July 11, 2006  RIGHT:  Greater than 1.0  LEFT:  Greater than 1.0   LOWER EXTREMITY BYPASS GRAFT DUPLEX EXAM:   DUPLEX:  Doppler arterial wave forms are biphasic proximal to, within,  and distal to the right fem-pop bypass graft and left superficial  femoral to posterior tibial artery bypass graft   IMPRESSION:  Ankle brachial indices are stable from previous study  bilaterally.   Patent right fem-pop bypass graft.   Patent left superficial femoral to posterior tibial artery bypass graft.   ___________________________________________  Quita Skye. Hart Rochester, M.D.   MC/MEDQ  D:  04/18/2007  T:  04/19/2007  Job:  [id number]

## 2011-01-30 NOTE — Op Note (Signed)
[name], [name]                ACCOUNT NO.:  [id number]   MEDICAL RECORD NO.:  [id number]          PATIENT TYPE:  INP   LOCATION:  3306                         FACILITY:  [facility]   PHYSICIAN:  Quita Skye. Hart Rochester, M.D.  DATE OF BIRTH:  04/27/1946   DATE OF PROCEDURE:  04/07/2009  DATE OF DISCHARGE:                               OPERATIVE REPORT   PREOPERATIVE DIAGNOSIS:  Right iliac occlusion with severe claudication,  right leg.   POSTOPERATIVE DIAGNOSIS:  Right iliac occlusion with severe  claudication, right leg.   OPERATION:  Left femoral to right femoral bypass using an 8-mm  Hemashield Dacron graft.   SURGEON:  Quita Skye. Hart Rochester, MD   FIRST ASSISTANT:  Janetta Hora. Fields, MD   SECOND ASSISTANT:  Della Goo, PA-C   ANESTHESIA:  General endotracheal.   PROCEDURE:  The patient was taken to the operating room and placed in a  supine position at which time satisfactory general endotracheal  anesthesia was administered.  The lower abdomen and both groins were  prepped with Betadine scrub and solution and draped in a routine sterile  manner.  Incisions were made bilaterally through the previous scars in  the groins, the patient having previously had femoral-popliteal bypass  graft.  On the right side, the vein graft anastomosis of the common  femoral artery was dissected free, encircled with vessel loops, and the  common femoral was exposed up beneath the inguinal ligament where it had  no pulse.  On the left side, the femoral-popliteal vein graft apparently  arose down the superficial femoral artery as it was not noted in the  inguinal area.  The distal-most external iliac artery was controlled  beneath the inguinal ligament and the superficial and profunda femoris  arteries were encircled with vessel loops.  There was an excellent pulse  in the common femoral artery.  A suprapubic tunnel was then created, an  8-mm Hemashield Dacron graft was delivered through the tunnel,  and the  patient was then heparinized.  After occluding the vessels proximally  and distally, a longitudinal opening was made in the common femoral  arteries bilaterally.  Right side had excellent inflow and left side had  very sluggish inflow.  The 8-mm graft was then spatulated on both sides,  measured appropriately, and sewn to the common femoral arteries with 5-0  Prolene.  Right side was completed initially and opened with no  significant change in the blood pressure and after appropriate flushing  the left side was completed.  There was an excellent pulse and excellent  Doppler flow in both sides.  There was also a palpable posterior tibial  pulse on the left and dorsalis pedis pulse on  the right.  Protamine was given to reverse the heparin.  Following  adequate hemostasis, wounds were closed in layers with Vicryl in a  subcuticular fashion with Dermabond.  Sterile dressing was applied.  The  patient was taken to the recovery room in satisfactory condition.      Quita Skye Hart Rochester, M.D.  Electronically Signed     JDL/MEDQ  D:  04/07/2009  T:  04/08/2009  Job:  [id number]   cc:   Corky Crafts, MD

## 2011-01-30 NOTE — Procedures (Signed)
DUPLEX ULTRASOUND OF ABDOMINAL AORTA   INDICATION:  Abdominal aortic aneurysm.   HISTORY:  Diabetes:  No  Cardiac:  MI, CHF  Hypertension:  Yes  Smoking:  Previous  Connective Tissue Disorder:  Family History:  No  Previous Surgery:  No   DUPLEX EXAM:         AP (cm)                   TRANSVERSE (cm)  Proximal             2.8 cm                    2.7 cm  Mid                  2.2 cm                    2.2 cm  Distal               2.7 cm                    2.5 cm  Right Iliac          1.2 cm                    1.1 cm  Left Iliac           0.9 cm                    1.1 cm   PREVIOUS:  Date:  AP:  TRANSVERSE:   IMPRESSION:  Diameter measurements throughout the abdominal aorta do not  suggest aneurysmal dilatation.   ___________________________________________  Quita Skye Hart Rochester, M.D.   CH/MEDQ  D:  03/22/2009  T:  03/22/2009  Job:  [id number]

## 2011-01-30 NOTE — Consult Note (Signed)
[name], [name] NO.:  [id number]   MEDICAL RECORD NO.:  [id number]          PATIENT TYPE:  INP   LOCATION:  2011                         FACILITY:  [facility]   PHYSICIAN:  Lyn Records, M.D.   DATE OF BIRTH:  12/01/1945   DATE OF CONSULTATION:  DATE OF DISCHARGE:                                 CONSULTATION   INDICATION FOR CONSULTATION:  Prolonged chest pain.   CONCLUSIONS:  1. Prolonged chest pain of anginal quality.      a.     No acute EKG changes, April 11, 2009 during pain.  2. Coronary atherosclerotic heart disease.      a.     Status post anterior MI, treated with drug-eluting and bare-       metal stents (distal and proximal respectively) to the LAD in       February 2010.      b.     Left ventricular dysfunction with EF of 23%.  3. Congestive heart failure secondary to ischemic cardiomyopathy,      November 02, 2008.  4. Peripheral vascular disease.      a.     Left-to-right fem-fem bypass, Dr. Quita Skye. Hart Rochester on April 07, 2009.  5. Gastroesophageal reflux.  6. Hyperlipidemia.   RECOMMENDATIONS:  1. Cancel discharge for today.  2. Nitroglycerin patch.  3. Serial cardiac markers.  We would also check BNP.  4. We will inform Dr. Amil Amen of the patient's arrival/status.  5. Optimize medical therapy.   COMMENTS:  The patient is 65 years of age and has a history of ischemic  heart disease and peripheral vascular disease.  She underwent a left-to-  right fem-fem bypass for right iliac occlusive disease.  She has done  well and was to be discharged this morning, but had 40-50 minutes of  left precordial chest discomfort.  This was somewhat similar to her pain  during myocardial infarction, but less severe.  Two nitroglycerin  tablets led to gradual improvement and she is nearly pain-free at this  time.  EKG done, did not demonstrate any acute ST-T wave changes.  The  patient denies dyspnea.  She is able to lay flat in bed.   Her medications  on admission to the hospital included aspirin 325 mg per  day, Lasix 40 mg per day, Premarin 0.65 mg per day, ramipril 2.5 mg per  day, Plavix 75 mg per day, Klor-Con 20 mEq per day, metoprolol ER 25 mg  per day, vitamin D 50,000 units weekly, zolpidem 5 mg two tablets at  bedtime, Lexapro 20 mg daily, Protonix 40 mg daily, Crestor 20 mg per  day.   ALLERGIES:  MORPHINE.   PHYSICAL EXAMINATION:  VITAL SIGNS:  The patient's blood pressure is  112/72, heart rate 53.  She is afebrile.  O2 saturation 95% on room air.  NECK:  No JVD is noted.  CARDIAC:  No rub, and S4 gallop is heard.  ABDOMEN:  Soft.  EXTREMITIES:  No edema.  LUNGS:  Clear.   EKG reveals poor R-wave  progression V1 through V5 with T-wave flattening  in left axis deviation.  No acute ST-T wave changes noted.   DISCUSSION:  The patient's chest discomfort is concerning.  She did have  bare-metal stent placed in the LAD in February, approximately 5 months  ago.  There was also a distal drug-eluting stent.  We will exclude  myocardial infarction.  I will add a long-acting nitrates.  We will  cancel discharge for today and monitor the patient's progress.  Certainly need to try to avoid any invasive procedures immediately,  given recent fem-fem bypass.  We will inform Dr. Amil Amen of the  patient's status in the hospital.      Lyn Records, M.D.  Electronically Signed     HWS/MEDQ  D:  04/11/2009  T:  04/12/2009  Job:  [id number]   cc:   Quita Skye. Hart Rochester, M.D.  Francisca December, M.D.  Corky Crafts, MD

## 2011-01-30 NOTE — Discharge Summary (Signed)
[name], [name] NO.:  [id number]   MEDICAL RECORD NO.:  [id number]          PATIENT TYPE:  INP   LOCATION:  2011                         FACILITY:  [facility]   PHYSICIAN:  Quita Skye. Hart Rochester, M.D.  DATE OF BIRTH:  Jan 23, 1946   DATE OF ADMISSION:  04/07/2009  DATE OF DISCHARGE:  04/11/2009                               DISCHARGE SUMMARY   Patient of Dr. Hart Rochester.   FINAL DISCHARGE DIAGNOSES:  1. Right iliac occlusive disease.  2. Hypertension.  3. Coronary artery disease, status post percutaneous transluminal      coronary angioplasty with drug-eluting stents.  4. Gastroesophageal reflux disease.   PROCEDURE PERFORMED:  Left-to-right femoral-femoral bypass grafting with  8-mm Dacron graft by Dr. Hart Rochester.   COMPLICATIONS:  None.   CONDITION AT DISCHARGE:  Stable, improving.   DISCHARGE MEDICATIONS:  She is instructed to resume the following  medications:  1. Aspirin 325 mg p.o. daily.  2. Lasix 40 mg p.o. daily.  3. Premarin 0.625 mg p.o. daily.  4. Ramipril 2.5 mg p.o. daily.  5. Plavix 75 mg p.o. daily.  6. Klor-Con 20 mEq p.o. daily.  7. Metoprolol ER 25 mg p.o. daily.  8. Vitamin D 50,000 units p.o. weekly.  9. Zolpidem 5 mg 1-2 tablets p.o. nightly.  10.Lexapro 20 mg p.o. daily.  11.Protonix 40 mg p.o. daily.  12.Crestor 20 mg p.o. daily.  13.Clonazepam 1 mg 2 tablets p.o. nightly.  14.Nitroglycerin 0.4 mg sublingual p.r.n. chest discomfort.  15.She is given a prescription for Vicodin 5/500 one to two p.o. q.6      h. p.r.n. pain, total #40 were given.   DISPOSITION:  She is being discharged home in stable condition with her  wounds healing well.  She is given careful instructions regarding the  care of her wounds and activity level.  She is given a return  appointment to see Dr. Hart Rochester on April 26, 2009.   BRIEF IDENTIFYING STATEMENT:  For complete details, please refer the  typed history and physical.  Briefly, this very pleasant 65 year old  woman was evaluated by Dr. Hart Rochester for right iliac occlusive disease.  He  felt that she should undergo left-to-right femoral-femoral bypass  grafting.  She was informed of the risks and benefits of the procedure  and after careful consideration, elected to proceed with surgery.   HOSPITAL COURSE:  Preoperative workup was completed as an outpatient.  She was brought in through Same-Day Surgery and underwent the  aforementioned bypass.  For complete details, please refer to the typed  operative report.  The procedure was without complications.  She was  returned to the Post Anesthesia Care Unit.  Following stabilization, she  was transferred to a bed on a Surgical Step-Down Unit.  She was observed  overnight.  The following morning, she was transferred to a bed on a  surgical convalescent floor.  Following transfer, her diet and  activities were increased as tolerated.  On April 11, 2009, she was  walking and tolerating food.  She was desirous of discharge and was  subsequently discharged home.  Wilmon Arms, PA      Quita Skye Hart Rochester, M.D.  Electronically Signed    KEL/MEDQ  D:  04/11/2009  T:  04/11/2009  Job:  [id number]

## 2011-01-30 NOTE — Consult Note (Signed)
[name], [name]                ACCOUNT NO.:  [id number]   MEDICAL RECORD NO.:  [id number]          PATIENT TYPE:  AMB   LOCATION:  SDS                          FACILITY:  [facility]   PHYSICIAN:  Quita Skye. Hart Rochester, M.D.  DATE OF BIRTH:  September 25, 1945   DATE OF CONSULTATION:  02/03/2009  DATE OF DISCHARGE:  02/03/2009                                 CONSULTATION   CHIEF COMPLAINT:  Severe claudication, right leg secondary to iliac  occlusive disease.   HISTORY OF PRESENT ILLNESS:  This 65 year old female patient who is well  known to me having previously undergone right femoral-to-below-knee  popliteal bypass with saphenous vein as well as left femoral-to-  posterior tibial bypass with saphenous vein.  These grafts continue to  function well.  She developed severe claudication symptoms in the entire  right leg about 3-4 months ago and it was noted this required her to  walk with a cane for support.  In February 2010, she had an anterior  myocardial infarction and had 2 drug-eluting stents, one bare-metal  stent in the proximal LAD.  She was found to have an ejection fraction  of 23%.  Since that time, she has been relatively stable but has had one  episode of congestive failure.  Today, she had angiography performed by  Dr. Eldridge Dace, which I reviewed with him, which revealed a widely patent  aortoiliac system on the left with a previous stent in the left external  iliac artery.  On the right side, the external iliac artery is totally  occluded from the origin to the inguinal ligament, reconstitution of the  right common femoral artery, and a widely patent right femoral to below-  knee popliteal bypass with 3-vessel runoff.  The left femoral-to-  posterior tibial bypass graft is also widely patent.   PHYSICAL EXAMINATION:  VITAL SIGNS:  Blood pressure is 108/70, heart  rate 68, respirations 16.  GENERAL:  She is alert and oriented x3.  NECK:  Supple.  A 3+ carotid pulses.  ABDOMEN:  Soft,  nontender with no masses.  EXTREMITIES:  Left leg has 3+ femoral, popliteal, and posterior tibial  pulse.  Right leg has absent femoral and distal pulses.  Both feet are  adequately perfused.   IMPRESSION:  1. Right external iliac artery occlusion, patent right femoral-to-      popliteal saphenous vein graft.  2. Patent left femoral posterior tibial vein graft.  3. Coronary artery disease status post myocardial infarction in      February 2010.   PLAN:  As discussed with Dr. Eldridge Dace, it is for me to see the patient  back in about 6 weeks and scheduled her for left-to-right femoral-  femoral bypass grafting in about 2 months, which will be late July.  I  have discussed this with the patient and she is in agreement.      Quita Skye Hart Rochester, M.D.  Electronically Signed    JDL/MEDQ  D:  02/03/2009  T:  02/04/2009  Job:  [id number]   cc:   Corky Crafts, MD

## 2011-02-01 ENCOUNTER — Ambulatory Visit: Payer: Medicare Other | Admitting: Internal Medicine

## 2011-02-02 NOTE — Op Note (Signed)
[name], [name]                ACCOUNT NO.:  [id number]   MEDICAL RECORD NO.:  [id number]          PATIENT TYPE:  OIB   LOCATION:  2899                         FACILITY:  [facility]   PHYSICIAN:  Quita Skye. Hart Rochester, M.D.  DATE OF BIRTH:  09-26-1945   DATE OF PROCEDURE:  04/23/2005  DATE OF DISCHARGE:  04/18/2005                                 OPERATIVE REPORT   PREOP DIAGNOSIS:  Right superficial femoral occlusive disease with emboli at  the right foot.   POSTOP DIAGNOSIS:  Right superficial femoral occlusive disease with emboli  at the right foot.   OPERATIONS:  Right common femoral to popliteal (below knee) bypass using a  nonreversed translocated saphenous vein graft to right leg with  intraoperative arteriogram.   SURGEON:  Dr. Hart Rochester.   FIRST ASSISTANT:  Di Kindle. Edilia Bo, M.D.   SECOND ASSISTANT:  Pecola Leisure, [place].   ANESTHESIA:  General endotracheal.   PROCEDURE:  The patient was taken to the operating room, placed in supine  position at which time satisfactory general endotracheal anesthesia was  administered. Right leg was prepped Betadine scrub and solution and draped  in routine sterile manner.  The saphenous vein was removed through multiple  incisions along the medial aspect of the right leg from the saphenofemoral  junction to the proximal third of the calf. There was a double system  proximally near the saphenofemoral junction and the more medial system was  preserved up to the saphenofemoral junction for possible later use although  it was not completely mobilized and dissected out past the upper third of  the thigh. The anterior segment was removed. Branches ligated with 4 and 5-0  silk ties and divided.  It was removed gently, dilated with heparinized  saline, marked for purposes. It was an adequate vein being at least 2.5 to 3  mm in size throughout. The common, superficial and profunda femoris arteries  were dissected free in the inguinal incision.   The popliteal artery was  exposed below the knee where it was soft normal-appearing vessel. There is  no pulse palpable there. An anatomic tunnel was created and the patient was  then heparinized. Femoral vessels were occluded with vascular clamps.  Longitudinal opening made in the common femoral artery with a 15 blade,  extended with Potts scissors. Proximal end vein was spatulated and  anastomosed end-to-side with 6-0 Prolene. Clamps then released and there was  good pulse down the first set of competent valves. Using the retrograde  valvulotome, the valves were rendered incompetent with resultant excellent  flow out of the distal end of the vein graft. Vein was carefully delivered  through the tunnel and then anastomosed end-to-side to the below-knee  popliteal artery with 6-0 Prolene. The artery was a normal-appearing vessel  which was about 3 mm in size. Following completion of this, the clamps  released. There was excellent pulse in the vein graft and good Doppler flow  in the foot. Intraoperative arteriogram revealed a  widely patent anastomosis with three-vessel runoff. Protamine was given to  reverse the heparin. Following adequate hemostasis, the wound  was irrigated  with saline, closed in layers with Vicryl in a subcuticular fashion. Steri-  Strips and sterile dressing applied. The patient taken to recovery in  satisfactory condition.       JDL/MEDQ  D:  04/23/2005  T:  04/23/2005  Job:  [id number]

## 2011-02-02 NOTE — Procedures (Signed)
[place]  Patient:    ZSAZSA, BAHENA Visit Number: [id number] MRN: [id number]          Service Type: END Location: ENDO Attending Physician:  Charna Elizabeth Dictated by:   Anselmo Rod, M.D. Proc. Date: 04/08/02 Admit Date:  04/08/2002 Discharge Date: 04/08/2002   CC:         Arvella Merles, M.D.   Procedure Report  DATE OF BIRTH:  Mar 22, 1946  PROCEDURE PERFORMED:  Esophagogastroduodenoscopy.  ENDOSCOPIST:  Anselmo Rod, M.D.  INSTRUMENT USED:  Olympus video panendoscope.  INDICATIONS FOR PROCEDURE:  Dysphagia in a 65 year old white female, rule out esophageal strictures, esophagitis, etc.  PREPROCEDURE PREPARATION:  Informed consent was procured from the patient. The patient was fasted for eight hours prior to the procedure.  PREPROCEDURE PHYSICAL:  VITAL SIGNS:  Stable.  NECK:  Supple.  CHEST:  Clear to auscultation, S1 and S2 regular.  ABDOMEN:  Soft with normal bowel sounds.  DESCRIPTION OF PROCEDURE:  The patient was placed in the left lateral decubitus position and sedated with 50 mg of Demerol and 5 mg of Versed intravenously.  Once the patient was adequately sedated, and maintained on low flow oxygen and continuous cardiac monitoring, the Olympus video panendoscope was advanced through the mouth piece over the tongue into the esophagus under direct vision.  The entire esophagus appeared normal with no evidence of ring, stricture, masses, esophagitis, or Barretts esophagus.  The scope was then advanced into the stomach.  There was moderate antral gastritis.  No erosions, ulcerations, masses, or polyps were seen.  The proximal small bowel appeared normal.  No evidence of hiatal hernia was noted on retroflexion.  IMPRESSION:  Antral gastritis, otherwise normal esophagogastroduodenoscopy.  RECOMMENDATIONS:  Proceed with a colonoscopy at this time. Dictated by:   Anselmo Rod, M.D. Attending Physician:   Charna Elizabeth DD:  04/08/02 TD:  04/11/02 Job: [id number]

## 2011-02-02 NOTE — Op Note (Signed)
[place]  Patient:    [name], [name]                         MRN: [id number] Proc. Date: 10/07/00 Attending:  Quita Skye. Hart Rochester, M.D.                           Operative Report  PREOPERATIVE DIAGNOSIS:  Ischemic left leg secondary to superficial femoral, popliteal, and tibial occlusive disease.  POSTOPERATIVE DIAGNOSIS:  Ischemic left leg secondary to superficial femoral, popliteal, and tibial occlusive disease.  PROCEDURE:  Left superficial femoral to posterior tibial bypass using a nonreversed translocated saphenuos vein graft from left leg with intraoperative arteriogram.  SURGEON:  Quita Skye. Hart Rochester, M.D.  ASSISTANT:  Di Kindle. Edilia Bo, M.D. and Dorma Russell  ANESTHESIA:  General endotracheal anesthesia.  DESCRIPTION OF PROCEDURE:  The patient was taken to the operating room and placed in the supine position at which time satisfactory general endotracheal anesthesia was administered.  The left leg was prepped with Betadine scrub and solution and draped in the routine sterile manner.  The saphenous vein was exposed through multiple incisions along the medial aspect of the left leg from the saphenofemoral junction to the midcalf, its branches ligated with 4-0 and 5-0 silk ties and divided.  It was removed gently, dilated with heparinized saline, and marked for orientation purposes.  It was good down to about the knee level where it became smaller and it was satisfactory for using as a conduit to the posterior tibial artery.  The posterior tibial artery was exposed through the vein harvesting incision about 4 or 5 cm distal to its origin.  It was a normal appearing vessel at this point, but was pulseless. The superficial femoral artery was then exposed in the proximal groin incision about 8 or 10 cm distal to its origin to get adequate length for the good vein that was available to bypass from the superficial femoral to the  posterior tibial artery.  The superficial femoral artery was dissected free.  It was a nice normal appearing vessel at this point with good pulse.  Subfascial anatomic tunnel was created and the patient was heparinized.  Superficial femoral artery was occluded proximally and distally, opened with a 15 blade, and extended with Potts scissors.  It would accept a 4.5 mm dilator proximally and had excellent inflow.  The vein was carefully measured and spatulated and anastomosed end-to-side using continuous 6-0 Prolene.  Following this, the valves were rendered incompetent using a retrograde valvulotome and there was excellent flow out of the distal end of the vein graft.  The vein was carefully delivered through the tunnel which was an anatomic tunnel and it was spatulated.  After opening the posterior tibial artery with the 15 blade and extending it with the Potts scissors.  The posterior tibial would accept a 2.5 mm dilator.  The anastomosis was done with continuous 6-0 Prolene for the heel, interrupted 7-0 Prolene for the toe.  Following appropriate flushing, this was completed, the vessel loop was released, and there was an excellent pulse in the vein graft and a palpable posterior tibial pulse at the ankle. Intraoperative arteriogram revealed a widely patent distal anastomosis into the proximal posterior tibial artery which retrograde filled the peroneal with the posterior tibial artery being a good vessel to the ankle.  Protamine was given to reverse the heparin.  Following adequate hemostasis, the wound  was irrigated with saline, closed in layers with Vicryl and subcuticular fashion with clips.  Sterile dressing applied and the patient was taken to the recovery room in satisfactory condition. DD:  10/07/00 TD:  10/07/00 Job: [id number]

## 2011-02-02 NOTE — Op Note (Signed)
NAMEELLINORE, MERCED NO.:  [id number]   MEDICAL RECORD NO.:  [id number]          PATIENT TYPE:  EMS   LOCATION:  MAJO                         FACILITY:  [facility]   PHYSICIAN:  Georgiana Spinner, M.D.    DATE OF BIRTH:  Dec 15, 1945   DATE OF PROCEDURE:  03/20/2006  DATE OF DISCHARGE:                                 OPERATIVE REPORT   PROCEDURE:  Upper endoscopy with foreign body removal.   ANESTHESIA:  Demerol 25, Versed 12 mg.   PROCEDURE:  With the patient mildly sedated in the left lateral decubitus  position, the Olympus videoscopic endoscope was inserted into the mouth. The  patient was (too much static)           ______________________________  Georgiana Spinner, M.D.     GMO/MEDQ  D:  03/20/2006  T:  03/20/2006  Job:  [id number]

## 2011-02-02 NOTE — Op Note (Signed)
[place]  Patient:    [name], [name]                       MRN: [id number] Proc. Date: 04/24/01 Adm. Date:  [id number] Attending:  Carlena Sax CCArvella Merles, M.D.   Operative Report  PREOPERATIVE DIAGNOSIS:  Large, deep cervical mass involving the left neck.  POSTOPERATIVE DIAGNOSIS:  Large, deep cervical mass involving the left neck.  PROCEDURES: 1. Cervical esophagoscopy. 2. Direct laryngoscopy. 3. Excision of deep cervical mass, including dissection of the left jugular    vein.  SURGEON:  Veverly Fells. Arletha Grippe, M.D.  ASSISTANT:  Kinnie Scales. Annalee Genta, M.D.  ANESTHESIA:  General endotracheal.  INDICATIONS FOR SURGERY:  This is a 65 year old white female, history of tobacco use, who has had a slowly enlarging mass involving the left neck, which has been mobile, soft, and nontender over the past few months.  Complete head and neck examination did reveal about a 3 x 3 cm soft mass involving the midjugular chain on the left side.  The rest of her head and neck examination, including fiberoptic laryngoscopy, was unremarkable.  MRI of the neck with and without gadolinium did reveal a large cystic mass involving the midjugular chain on the left neck but otherwise no pathology was noted.  Fine needle aspiration of this mass was obtained, which did reveal a lot of mucoid fluid with debris, and final pathology showed some necrotic debris and inflammatory cells.  Based on her history and physical examination, I have recommended proceeding with the above-noted surgical procedure.  I have discussed extensively with her and her family the risks and benefits of surgery, including the risk of general anesthesia, infection, bleeding, injury to great vessels and cranial nerves, and the normal recovery period to expect after this type of surgery.  I have entertained any questions, answered them appropriately, and informed consent has been  obtained and patient presents for the above-noted procedure.  OPERATIVE FINDINGS:  Multiple lymph nodes involving the level 2 and 3 of the left jugular chain, one that was about approximately 3 x 3 _____ cm in dimension.  Frozen section revealed debris and squamous degeneration but no signs of carcinoma.  DESCRIPTION OF PROCEDURE:  The patient was brought in the operating room and placed in the supine position.  General endotracheal anesthesia administered via the anesthesiologist without complication.  The patient was administered 1 g of Ancef IV x 1 and 10 mg of Decadron IV x 1.  The table was turned 90 degrees, the patients shoulders were placed in a shoulder roller, and her head as placed in a doughnut.  The patients face was draped in standard fashion.  Moistened 4 x 4s were placed into the oral cavity to protect the upper alveolar ridge.  A cervical esophagoscope was inserted into the oral cavity.  This was used to carry out a direct cervical esophagoscopy.  The postcricoid area and cricopharyngeal area and upper cervical esophagus appeared normal, and there was no sign of any other abnormalities.  Therefore, no biopsies were taken.  The cervical esophagoscope was removed without difficulty.  Next, a large Dedo laryngoscope was inserted into the oral cavity.  This was used to carry out a direct laryngoscopy.  Apex of both piriform sinuses, postcricoid area, vallecula, epiglottis, and larynx all appeared normal, as did the tongue base.  No biopsies were taken, and laryngoscope was removed without  difficulty.  Bimanual palpation of the tongue base did not show any signs of other abnormality.  Next, the patients left neck was sterilely prepped and draped in standard fashion.  A horizontal incision overlying the neck mass approximately 3 cm below the angle of the mandible was marked out in a natural skin crease approximately 4 cm in length. It was injected with a total of 7 cc of a 1%  lidocaine solution with 1:100,000 epinephrine.  A skin incision was made with a scalpel blade and dissection was carried down sharply through subcutaneous tissue and through platysma. Bleeding was controlled with electrocautery without difficulty.  Subplatysmal planes were elevated superiorly and inferiorly using both blunt and sharp dissection.  Large mass was next identified, and it was both bluntly and sharply dissected off of the deep neck structures, including the SCM muscle and the internal jugular vein.  This mass was then also adherent to some superior jugular lymph nodes, which were also dissected free from the jugular vein using meticulous blunt and sharp dissection.  Big bleeders were controlled with hemostats and tied off with 3-0 silk ties, smaller bleeders controlled with electrocautery without difficulty.  The mass was then dissected free and the hypoglossal nerve was identified and preserved, as was the internal jugular vein.  This mass was then sent to surgical pathology for frozen section analysis.  Frozen section was consistent with some necrotic debris and some squamous debris but no overt signs of carcinoma.  The area was then irrigated with copious amounts of irrigation fluid, suctioned dry. Bleeding was controlled with electrocautery without difficulty.  A 7 mm fully-fluted Blake drain was inserted through a separate stab incision approximately in the lower flap and was sutured to the neck using a 3-0 nylon stitch.  Subcutaneous tissues were closed with interrupted 3-0 Vicryl suture, as was platysma muscle, and final skin closure was achieved with a running 5-0 nylon stitch.  Bacitracin ointment was placed over the wound without difficulty.  FLUIDS GIVEN DURING THE PROCEDURE:  Approximately 1200 cc crystalloid.  ESTIMATED BLOOD LOSS:  Less than 30 cc.  URINE OUTPUT:  Not measured.  DRAINS:  One closed-suction drain was placed.  SPECIMENS:  Left deep neck  mass.  PACKS:  There were no packs.  DISPOSITION:  The patient tolerated the procedure well without complications,  was extubated in the operating room and transferred to the recovery room in stable condition.  The sponge, instrument, and needle count was correct at the end of the procedure.  Total duration of procedure was approximately 1-1/2 hours.  The patient will be admitted for overnight recovery.  Once she has recovered well, she will be discharged home on April 25, 2001, after the drain has been removed.  A pressure bandage will then be placed, which she will be instructed to remove on postop day #3.  She will be sent home on Keflex 500 mg p.o. t.i.d. for 10 days and Vicodin #30 with two refills, one to two tablets p.o. q.4h. p.r.n. pain.  She is to have regular diet and activity and continue with local wound care, including bacitracin ointment to the wound three times daily.  Both she and her family were given oral and written instructions. They are to call with any problems with bleeding, fever, vomiting, pain, reaction to medications, or any other questions.  She will follow up in the office on Thursday, August 15, at 1:50 p.m.DD:  04/24/01 TD:  04/24/01 Job: [id number]

## 2011-02-02 NOTE — Procedures (Signed)
[place]  Patient:    [name], [name] Visit Number: [id number] MRN: [id number]          Service Type: END Location: ENDO Attending Physician:  Charna Elizabeth Dictated by:   Anselmo Rod, M.D. Proc. Date: 04/08/02 Admit Date:  04/08/2002 Discharge Date: 04/08/2002   CC:         Arvella Merles, M.D.   Procedure Report  DATE OF BIRTH:  2046-07-15  PROCEDURE:  Colonoscopy with biopsies.  ENDOSCOPIST:  Anselmo Rod, M.D.  INSTRUMENT:  Pediatric adjustable Olympus colonoscope.  INDICATIONS FOR PROCEDURE:  This is a 65 year old white female with a questionable family history of colon cancer to rule out colonic polyps, masses, hemorrhoids, etc.  PREPROCEDURE PREPARATION:  Informed consent was secured from the patient.  The patient was fasted for eight hours prior to the procedure and prepped with a bottle of magnesium citrate and one gallon of NuLytely the night prior to the procedure.  PREPROCEDURE PHYSICAL:  The patient had stable vital signs.  Neck is supple. The chest is clear to auscultation.  Abdomen is soft with normal bowel sounds.  DESCRIPTION OF PROCEDURE:  The patient was placed in the left lateral decubitus position and sedated with an additional 6.25 mg of Demerol and 5 mg of Versed intravenously.  Once the patient was adequately sedated and maintained on low flow oxygen and continuous cardiac monitoring, the Olympus video colonoscope was advanced from the rectum to the cecum with difficulty. There was a large amount of residual in the colon.  Multiple washings were done.  The procedure was completed to the cecum.  The appendiceal orifice and ileocecal valve were clearly visualized and photographed.  A small patch of erythema was seen at 30 cm.  This was biopsied for pathology.  Small internal hemorrhoids were seen on retroflexion.  Due to some residual stool in the colon, very small lesions could have been  missed.  IMPRESSION: 1. Patchy erythema at 30 cm biopsied for pathology. 2. Swollen non-bleeding internal hemorrhoids. 3. Significant amount of residual stool in the colon.  RECOMMENDATIONS: 1. Await pathology results. 2. Outpatient follow-up in the next 7-10 days for further    recommendations. Dictated by:   Anselmo Rod, M.D. Attending Physician:  Charna Elizabeth DD:  04/08/02 TD:  04/11/02 Job: [id number]

## 2011-02-02 NOTE — Consult Note (Signed)
NAMEBRESLIN, BURKLOW                          ACCOUNT NO.:  [id number]   MEDICAL RECORD NO.:  [id number]                   PATIENT TYPE:  INP   LOCATION:  7829                                 FACILITY:  [facility]   PHYSICIAN:  Francisca December, M.D.               DATE OF BIRTH:  [birth year]   DATE OF CONSULTATION:  01/12/2003  DATE OF DISCHARGE:                                   CONSULTATION   REASON FOR CONSULTATION:  Chest pain.   IMPRESSIONS:  1. Exertional and left chest pain (atypical angina).  2. Multiple risk factors for coronary atherosclerosis.  3. Arteriosclerotic peripheral vascular disease.  4. Status post left femoropopliteal (give 60% chance of significant coronary     artery disease).  5. Hypertension/hypertensive urgency yesterday.  6. Falling to the right, question cerebrovascular accident, negative CT     scan, MRI pending.  7. Ongoing tobacco abuse.  8. Postmenopausal state, on Premarin.   RECOMMENDATIONS:  1. Agree with Lovenox and aspirin.  2. Agree with carotid Doppler and 2-D echo.  3. Hold on Plavix while awaiting full CAD evaluation (namely, CABG).  4. Await result of MRI:  If positive for CVA, obtain adenosine Cardiolite.  5. If negative for CVA, then diagnostic cardiac catheterization indicated.  6. Will increase beta blocker for treatment of hypertension, angina, add     amlodipine.   FINDINGS:  The patient is a pleasant 65 year old woman who is the wife of my  patient, Zanyia Silbaugh.  She was admitted yesterday after presenting to Dr.  Tiburcio Pea' office complaining of loss of balance, falling to the right.  She  was noted to have markedly elevated blood pressure and was referred to  [facility] Emergency Room.  Initial blood pressure was 223/110.  She  received 1 mg of clonidine p.o.  Dr. Jonell Cluck saw her in the emergency  room, obtained a history of rest and exertional chest pain and increasing  shortness of breath.  When she attempted to have the  patient walk she  promptly complained of chest discomfort and had to sit down.  Since being  admitted she has had no further episodes of chest discomfort.   The pain is mid to lower substernal, does not radiate, and described in  multiple fashions.  It is sharp, also dull and aching, some heaviness.  It  can occur more recently with exertion.  She was doing some outdoor painting  last week and had an episode that lasted for 10 minutes.  It was not  associated with nausea or diaphoresis.  That particular episode was more  severe.  It has been worsening over the past two months.  She does not have  nocturnal symptoms.  She has chronic exertional dyspnea and could not walk  up a flight of stairs because of this.  She has a long smoking history.   PAST MEDICAL HISTORY:  1. Hypertension.  2. Postmenopausal state.  3. 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    MEDICATIONS:  1. Atenolol 50 mg p.o. daily.  2. Premarin 0.625 mg p.o. daily.   DRUG ALLERGIES:  None known.   FAMILY HISTORY:  She has one sister who has had carotid endarterectomy.  A  brother had myocardial infarction and stents in his 34s.  Both parents had  CAD and CVAs.   SOCIAL HISTORY:  She has a 38-pack-year smoking history.  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  She lives with her husband who has three-vessel  disease, coronary bypass, and is status post abdominal aneurysmectomy.  She  does not use any alcohol.   REVIEW OF SYSTEMS:  She has occasional occipital headache.  This is  unpredictable.  She has exertional shortness of breath, as noted above.  She  only started having these dizzy spells and falling to the right as of  yesterday.  Has a remote history of migraine.  She denies any anemia or  bleeding disorder.  She does not have diabetes or thyroid abnormalities.  She has a long history of hiatal hernia and reflux.  This also produces a  choking pain and  dysphagia on occasion.  She denies any hematemesis or  hematochezia.  No melena.  No dysuria, hematuria, or nocturia.  She has  hand, wrist arthritic joint pain.  No major joint disability.  She has  corrective lenses prescribed but generally does not wear them.  She has not  had any visual changes recently.  She has upper and lower dentures.  No  chronic brain problems.  No history of emotional disorder.  She frequently  has difficulty sleeping.   PHYSICAL EXAMINATION:  VITAL SIGNS:  Blood pressure is 160/73, pulse is 57  and regular, respiratory rate 18, temperature 97.  GENERAL:  Pleasant, thin 65 year old woman in no acute distress.  HEENT:  Unremarkable.  The head is atraumatic and normocephalic.  The pupils  are equal, round, and reactive to light and accommodation.  Extraocular  movements are intact.  Oral mucosa is pink and moist.  Tongue is not coated.  NECK:  Supple.  Without thyromegaly or masses.  The carotid upstrokes are  normal.  There is no bruit.  There is no jugular venous distention.  CHEST:  Has mildly diminished breath sounds bilaterally but adequate  excursion.  No wheezes, rales, or rhonchi.  HEART:  Regular rhythm.  Normal S1, S2 heard.  No S3, S4, murmur, click, or  rub noted.  The PMI is not palpable.  ABDOMEN:  Soft, flat, nontender.  No hepatosplenomegaly or midline pulsatile  mass.  There is a right upper quadrant well-healed surgical scar.  There is  no abdominal bruit.  GENITOURINARY:  External genitalia is without lesions.  RECTAL:  Not performed.  EXTREMITIES:  Show well-healed left femoral groin surgical scar.  She has  full range of motion of all of her extremities.  There is no edema.  There  are no palpable pulses in the left foot.  There are 1+/2 dorsalis pedis and  posterior tibial pulses on the right.  There is a full femoral pulse on the  right.  Soft bruits are present bilaterally. NEUROLOGIC:  Cranial nerves II-XII are intact.  Motor and  sensory grossly  intact.  Gait not tested.  SKIN:  Warm, dry, and clear.   ACCESSORY CLINICAL DATA:  Hematocrit  is 41.4, platelets 270,000, white blood  cell count 11,100.  Serum electrolytes, BUN, creatinine, glucose normal.  Liver-associated enzymes normal.  CK-MB and troponin x3 have been negative.   Chest x-ray shows no active cardiopulmonary disease.  Initial CT scan of the  head was without any lesions.   Electrocardiogram shows normal sinus rhythm only.   COMMENTS:  It was very difficult for me to elicit a clear history of  exertional anginal chest pain.  She has a long history of GERD, which I  think is clouding the issue.  Certainly, the likelihood for significant  coronary disease is very high, and she deserves a full evaluation.  If her  MRI shows evidence of a recent CVA, then would not pursue cardiac  catheterization initially but would treat aggressively with medicines and  obtain a noninvasive study as noted above.  Further evaluation will be  driven by the results of the MRI as noted.   Thank you very much for allowing me to assist in the care of [facility].  It has been a pleasant to do so.  I will discuss her further care with you.                                               Francisca December, M.D.    JHE/MEDQ  D:  01/12/2003  T:  01/12/2003  Job:  [id number]   cc:   Holley Bouche, M.D.  [address]  [place], [place] [address]  Fax: [phone]   Lazaro Arms, M.D.  [address] [place], [place] [address]  Fax: [phone]

## 2011-02-02 NOTE — Op Note (Signed)
[name], [name]                          ACCOUNT NO.:  [id number]   MEDICAL RECORD NO.:  [id number]                   PATIENT TYPE:  AMB   LOCATION:  DSC                                  FACILITY:  [facility]   PHYSICIAN:  Adolph Pollack, M.D.            DATE OF BIRTH:  [birth year]   DATE OF PROCEDURE:  06/11/2002  DATE OF DISCHARGE:                                 OPERATIVE REPORT   PREOPERATIVE DIAGNOSES:  1. Anal polyp.  2. Left groin abscess.   POSTOPERATIVE DIAGNOSES:  1. Anal polyp.  2. Left groin abscess.   OPERATION PERFORMED:  1. Anal polypectomy.  2. Simple incision and drainage of left groin abscess.   SURGEON:  Adolph Pollack, M.D.   ANESTHESIA:  General.   INDICATIONS FOR PROCEDURE:  The patient is a 65 year old female who  underwent a colonoscopy by Dr. Loreta Ave.  Colonic polyp was biopsied and was  benign; however, there was an anal polyp that was not able to be removed  basically near the dentate line.  This was palpable on exam  and she now  presents for removal.  In the holding area she informed me that she has  developed a boil that was very painful in her left groin and asked me to  examine it and lance it if necessary.   DESCRIPTION OF PROCEDURE:  She was placed supine on the operating table and  a general anesthetic was administered.  She was then placed in the lithotomy  position.  On examination, she indeed had a small left groin abscess in the  groin crease.  Digital rectal exam was then performed and a fullness felt on  the right posterolateral area.  She also has multiple external skin tags.   The perianal area was then sterilely prepped and draped.  The anoscope was  introduced into the anus.  In the right posterolateral region, an anal polyp  associated with what appeared to be a hemorrhoidal complex was noted.  It  was grasped with Allis forceps and local anesthetic consisting of 0.5% plain  Marcaine and 1% lidocaine with epinephrine  was infiltrated around the area  for partial anal block.  Using the cautery, the polyp was then excised and  sent to pathology.  Bleeding was controlled with the cautery.  A piece of  Gelfoam was then placed against the raw surface.   Next, I made a cruciate incision in the groin abscess draining a small  amount of pus from it.  I did cut some of the corners to leave the abscess  cavity open.  Following this, dressings were placed over both wounds.  She  subsequently was placed back in the supine position.  The general anesthetic  was reversed and she was taken to the recovery room.  The patient tolerated  the procedure well without any apparent complications.  She will be sent  home with postoperative  instructions and a prescription for Tylox for pain.                                                Adolph Pollack, M.D.    Kari Baars  D:  06/11/2002  T:  06/11/2002  Job:  [id number]   cc:   Charna Elizabeth, M.D.  [address]  [place]  [place] [address]  Fax: [phone]   Holley Bouche, M.D.  [address]  [place], [place] [address]  Fax: [phone]

## 2011-02-02 NOTE — H&P (Signed)
[name], [name]                          ACCOUNT NO.:  [id number]   MEDICAL RECORD NO.:  [id number]                   PATIENT TYPE:  INP   LOCATION:  5708                                 FACILITY:  [facility]   PHYSICIAN:  Quita Skye. Hart Rochester, M.D.               DATE OF BIRTH:  17-Jun-1946   DATE OF ADMISSION:  12/06/2003  DATE OF DISCHARGE:                                HISTORY & PHYSICAL   CHIEF COMPLAINT:  Right foot pain with ischemic changes x4 days.   HISTORY OF PRESENT ILLNESS:  [name] is a 65 year old Caucasian female  who is a smoker who presents with right foot pain with ischemic changes  times 4 days.  The patient's medical history is significant for peripheral  vascular occlusive disease status post femoral to popliteal bypass graft in  her left lower extremity by Dr. Hart Rochester in January, 2002.  She has had no  difficulty in her left leg since undergoing this procedure.  However, she  did develop burning in her right toes since January, 2005.  This has been  more or less constant.  However, this past Friday, she developed sudden  worsening of tingling associated with pain in her right toes.  On Saturday,  she noticed her right toes digits 3 through 5 had become dark and purple.  She then presented to [facility] Emergency Department and was seen  by the covering vascular surgeon, Dr. Gretta Began, who felt she had had an  atheroembolic event to her right foot.  She was treated with pain  medications and given a prescription for Tylox and set up for follow up with  Dr. Hart Rochester the following Tuesday to include embolic workup.  However, once  the patient went home, she reported the Tylox was not helping to alleviate  her pain.  In fact, her pain was worsening and nothing seemed to improve her  symptoms.  She therefore, called the [facility] office this morning and was seen  briefly by another vascular surgeon, Dr. Madilyn Fireman, who then recommended that  she be admitted for pain  control and further workup.   [name]' primary symptom is right toe pain.  She denies numbness, skin  breakdown, fever, swelling.   PAST MEDICAL HISTORY/PAST SURGICAL HISTORY:  1. Peripheral vascular occlusive disease with left superficial femoral,     popliteal and tibial occlusive disease, status post remote popliteal     bypass grafting of her left lower extremity using a non-reverse     translocated saphenous vein graft from her left leg.  This was done in     January, 2002 by Dr. Hart Rochester.  2. History of coronary artery disease, status post heart catheterization on     January 13, 2003 showing ostial right coronary artery with 80% stenosis     status post angioplasty with successful stent placement.  At that time,     her ejection  fraction was noted to be 55% to 65%.  Mild aortic and mild     mitral regurgitation was noted.  3. 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  4. History of hypertension.  5. History of anal polyps and internal hemorrhoids.  Status post colonoscopy     in July, 2003 by Dr. Charna Elizabeth as well as EGD showing antral gastritis,     otherwise normal.  She is also status post anal polypectomy in September     of 2003 by Dr. Avel Peace.  6. History of simple incision and drainage of a left groin abscess from a     left groin boil.  Surgeon was Dr. Lonia Mad.  This was done in     September, 2003.  7. Hospitalized in April, 2004 for dizziness.  Treated for benign positional     vertigo. At that time, a head CT was done which showed no acute     hemorrhagic bleed.  She also had a negative head MRI for acute stroke.  A     carotid duplex was also done at that time which was negative for     significant internal carotid artery stenosis.  8. History of benign left breast tumor, status post removal in the 1960s.  9. History of open cholecystectomy.  10.      History of hysterectomy.  11.       History of left cervical mass.  Status post cervical esophagoscopy     and direct laryngoscopy for excision of deep cervical mass, including     dissection of left jugular vein.  This was done in August, 2002 by Dr.     Carlena Sax.  12.      She specifically denies history of stroke, dyslipidemia, diabetes     mellitus, COPD, kidney and thyroid disease.   ALLERGIES:  NO KNOWN DRUG ALLERGIES.   MEDICATIONS:  1. Benicar 40 mg 1 p.o. daily.  2. Premarin 1.25 mg p.o. daily.  3. Atenolol 50 mg 1.5 tables p.o. daily.  4. Hydroxyzine 1 p.o. q.h.s. before sleep.  Questionable dosage.   SOCIAL HISTORY:  She is married with one child.  She works in the [employer]. She smokes 1 pack of cigarettes per day for the past 29  years.  She denies alcohol use.   FAMILY HISTORY:  Both her parents are deceased.  Her mother died at age 19  but had a history of hypertension, peripheral vascular disease.  She died  from what sounds like sepsis secondary to gangrenous lower extremities. Her  father died at age 34 from a massive myocardial infarction.  Her brother  also has a history of peripheral vascular occlusive disease, status post  stent placement of his left lower extremity.   REVIEW OF SYSTEMS:  Please refer to history of present illness for  significant positives and negatives.  In addition, she does report upper and  lower dentures.  She also reports mild shortness of breath  more noticeable  with exertion. She also has an occasional dry cough.  She denies chest pain.  She has experienced some nausea with increased pain.  She denies vomiting,  diarrhea, constipation or hematochezia.  She denies dysuria, hematuria.  She  also denies history of known blood clots in the past.   PHYSICAL EXAMINATION:  VITAL SIGNS: Blood pressure is 174/101, heart rate  62, respirations 20, temperature is 98.4,  oxygen saturation 97% on room air. Height is 5 feet 4 inches, weight 135.7 pounds.   GENERAL: This is a 65 year old Caucasian female who appears slightly older  than her stated age.  She is alert and cooperative.  She is in no acute  distress but does have occasional grimaces.  She was just medicated with  morphine for pain.  HEENT: Her head is normocephalic and atraumatic.  Her pupils are equal,  round and reactive to light and accommodation.  Her sclerae are nonicteric.  Her oral mucous membranes are pink and moist.  She is wearing complete upper  and lower dentures.  NECK: Her neck is supple with no JVD, thyromegaly, lymphadenopathy noted.  She does have 2+ carotid pulses without bruits.  CHEST:  Breathing is nonlabored.  Her chest is symmetrical on inspiration.  Her breath sounds are decreased at the bases and otherwise clear.  HEART: Her heart has a regular rate and rhythm.  No murmur, rub or gallop  was noted.  ABDOMEN: Her abdomen is soft, nontender and nondistended.  She does have a  right upper quadrant well-healed incisional scar.  No masses or  hepatosplenomegaly were noted.  She does have normal active bowel sounds  times 4 quadrants.  GU/RECTAL:  These exams are deferred.  EXTREMITIES:  Her extremities are without edema.  No skin breakdown was  noted.  She does have 2+ radial pulses.  She has 2+ right femoral pulse and  a barely palpable left femoral pulse.  No palpable pulses were noted on the  left foot.  However, good Doppler signals were noted of the posterior tibial  peroneal and dorsalis pedis regions.  She did have 1+ palpable posterior  tibial and dorsalis pedis pulses on the right.  Doppler signals were also  obtained.  Her 4th and 5th right toes as well as the lateral aspect of her  3rd right toe show bluish discoloration.  Her foot is tender to touch.  NEUROLOGIC: She is alert and oriented times 4. Her speech is clear.  Her  gait was unobserved.  Neuro exam was grossly intact.   IMPRESSION:  [name] is a 65 year old Caucasian female with  known  history of severe peripheral vascular occlusive disease and tobacco  dependence.  She presents with ischemic right toes with an apparent  atheroemboli to her right foot.   PLAN:  She will be admitted to [facility] under the care of Dr.  Josephina Gip.  She will be admitted for pain control as well as for further  workup for an embolic source.  We will order an abdominal CT scan and a 2-D  echo as well as start her on aspirin and Plavix.  She will also undergo an  abdominal aortogram with runoffs by  Dr. Fabienne Bruns on December 07, 2003.      Jerold Coombe, P.A.                  Quita Skye Hart Rochester, M.D.    AWZ/MEDQ  D:  12/06/2003  T:  12/07/2003  Job:  [id number]   cc:   Holley Bouche, M.D.  [address]  [place], [place] [address]  Fax: [phone]

## 2011-02-02 NOTE — Discharge Summary (Signed)
[place]  Patient:    [name], [name]                       MRN: [id number] Adm. Date:  [id number] Disc. Date: 10/10/00 Attending:  Colvin Caroli Dictator:   Loura Pardon, P.A. CC:         Arvella Merles, M.D.  Helane Gunther, M.D.   Discharge Summary  DATE OF BIRTH:  October 19, 1945  ATTENDING SURGEON:  Dr. Quita Skye. Hart Rochester.  PRIMARY CAREGIVER:  Dr. Holley Bouche.  PODIATRIST:  Dr. Helane Gunther.  REFERRING PHYSICIAN:  Dr. Helane Gunther.  FINAL DIAGNOSIS:  Ischemic left lower extremity secondary to superficial femoral artery, popliteal and tibial arterial occlusive disease/left lower extremity claudication symptoms.  SECONDARY DIAGNOSES: 1. Arthritis of the left hallux, hammertoe of the fifth toe left foot. 2. Status post biplanar Austin bunionectomy of first metatarsophalangeal joint    left foot, arthroplasty proximal interphalangeal joint left fifth toe. 3. History of long-term current tobacco habituation. 4. Hypertension. 5. History of basal cell carcinoma of the nose. 6. History of migraine headaches. 7. History of cholecystitis. 8. Status post arteriogram, Dr. Hart Rochester, September 25, 2000.  PROCEDURES:  October 07, 2000, left superficial femoral artery to posterior tibial bypass graft with placement of nonreverse translocated greater saphenous vein of the left lower extremity, intraoperative arteriogram, Dr. Quita Skye. Hart Rochester.  The patient tolerated the procedure well.  There was a probable posterior tibial pulse in the postoperative period which has remained throughout her postoperative course.  DISPOSITION:  [name] is ready for discharge on postoperative day #3. She has done very well in the postoperative period.  As mentioned above, her graft has remained patent, with a palpable left posterior tibial pulse.  She has been afebrile postoperatively.  She has been off supplemental oxygen since postoperative day  #1.  She has experienced no respiratory compromise, no cardiac dysrhythmias.  On postoperative day #2 her potassium was 3.3, and this was replenished with oral potassium.  Her incisions are healing nicely.  There was no evidence of erythema, drainage, or swelling.  She is to keep her left lower extremity elevated when she is in bed or sitting.  Ankle brachial indices were obtained on postoperative day #1, and they were greater than 1.0 bilaterally.  She goes home on the following medications.  MEDICATIONS: 1. Percocet 5/325 1-2 tablets p.o. q.4-6h. p.r.n. pain. 2. Atenolol 50 mg daily. 3. Cenestin 1 p.o. daily. 4. Multivitamin daily.  ACTIVITY:  Ambulation as tolerated.  She is asked not to drive for the next two weeks.  DIET:  Low sodium, low cholesterol.  WOUND CARE:  She may shower daily, keeping her incisions clean and dry.  She is to elevate the left lower extremity on a pillow when in bed or in a chair.  FOLLOW-UP:  Office visit with [facility] on Friday, October 18, 2000, at 10 oclock in the morning for staple removal.  Office visit with Dr. Hart Rochester for two weeks after discharge, will be arranged at the office visit on October 18, 2000.  HISTORY OF PRESENT ILLNESS:  [name] is a 65 year old female referred by Dr. Helane Gunther for evaluation of peripheral vascular occlusive disease as evidenced by left lower extremity claudication after walking less than 100 feet.  Her left lower extremity would seem to "give up."  It is somewhat numb, and it requires her to stop walking.  The symptoms do resolve with rest.  Doppler study shows severe distal popliteal versus tibioperoneal trunk disease on the left lower extremity, with normal flow on the right lower extremity.  The patient underwent arteriogram on September 25, 2000.  The study demonstrated occlusion of the left popliteal artery at the knee with reconstitution of the very distal  tibioperoneal trunk.  All three tibial vessels are open to the ankle, the most patent vessel being the left posterior tibial artery.  The patient presents October 07, 2000, for elective revascularization surgery of left lower extremity.  HOSPITAL COURSE:  Is as described in the discharge disposition.  [name] postoperative course has run for three days, and she has done very well.  She is ready for discharge on October 10, 2000, in improved condition of the left lower extremity. DD:  10/09/00 TD:  10/09/00 Job: [id number]

## 2011-02-02 NOTE — Op Note (Signed)
[place]  Patient:    [name], [name]                         MRN: [id number] Proc. Date: 09/25/00 Attending:  Quita Skye. Hart Rochester, M.D. CC:         Quita Skye. Hart Rochester, M.D.             Peripheral Endovascular Catheterization Laboratory, [facility]                           Operative Report  PREOPERATIVE DIAGNOSIS: Left distal superficial femoral or popliteal occlusion with severe claudication, left leg.  OPERATION/PROCEDURE: Abdominal aortogram with bilateral lower extremity runoff via right common femoral approach, with selective catheterization of the left common iliac artery.  SURGEON: Quita Skye. Hart Rochester, M.D.  ANESTHESIA: Local xylocaine, Versed 2 mg intravenously.  DESCRIPTION OF PROCEDURE: The patient was taken to the peripheral endovascular laboratory and placed on the tablet in the supine position, at which time both groins were prepped with Betadine solution and draped in routine sterile manner.  After infiltration of 1% xylocaine the right common femoral artery was entered percutaneously and a guide wire passed into the suprarenal aorta under fluoroscopic guidance.  A standard pigtail catheter was passed over the guide wire and positioned in the suprarenal aorta.  A flush abdominal aortogram was performed, injecting 20 cc of contrast at 20 cc per second and this revealed the aorta to be relatively free of atherosclerotic disease and being widely patent, but small in caliber.  Both renal arteries were single and widely patent.  The inferior mesenteric artery was patent.  Both common internal and external iliac arteries were widely patent.  The pigtail catheter was then exchanged for an IMA catheter and the left common iliac artery cannulated, and a left lower extremity angiogram performed through this, injecting 40 cc of contrast at 8 cc per second.  This revealed the left common femoral, superficial femoral, and profunda femoris arteries to be  widely patent.  There was occlusion of the popliteal artery at the level of the knee joint with reconstitution of the tibioperoneal trunk faintly filling the posterior tibial and peroneal arteries.  The anterior tibial artery was reconstituted back near its origin.  There was no filling of the below-the-knee popliteal artery.  The posterior tibial artery appeared to be the vest vessel and filled all the way to the ankle.  A second view of this was obtained below the knee and this better visualized the reconstitution of the posterior tibial and peroneal arteries at their origin.  The IMA catheter was removed over the guide wire and the guide wire removed.  Right lower extremity angiogram was performed through the sheath, injecting 40 cc of contrast at 8 cc per second.  This revealed the right iliac system to be widely patent.  The right common femoral, superficial femoral, and profunda femoris arteries were widely patent, with some mild irregularity of the mid superficial femoral artery, and no areas of any significant stenosis.  The popliteal artery was also widely patent and there was three vessel runoff on the right through tibial vessels to the ankle.  Having tolerated the procedure well the sheath was removed, with adequate compression applied and no complications ensued.  FINDINGS:  1. Widely patent aortoiliac system with patent single renal arteries     bilaterally and inferior mesenteric artery.  2. Occlusion of left popliteal artery  at the knee joint, with reconstitution     of very distal tibioperoneal trunk, with all three tibial vessels being     open to the ankle, with the best being the posterior tibial artery.  3. Widely patent vessels in the right lower extremity with mild irregularity     in the mid superficial femoral artery without stenosis. DD:  09/25/00 TD:  09/25/00 Job: [id number]

## 2011-02-02 NOTE — Op Note (Signed)
[name], [name]                ACCOUNT NO.:  [id number]   MEDICAL RECORD NO.:  [id number]          PATIENT TYPE:  AMB   LOCATION:  ENDO                         FACILITY:  [facility]   PHYSICIAN:  Georgiana Spinner, M.D.    DATE OF BIRTH:  09/04/1946   DATE OF PROCEDURE:  DATE OF DISCHARGE:  02/06/2006                                 OPERATIVE REPORT   PROCEDURE:  Upper endoscopy with foreign body removal.   ANESTHESIA:  Demerol 125 mg, Versed 12 mg was given.   PROCEDURE:  With the patient mildly sedated in the left lateral decubitus  position, the Olympus videoscopic endoscope was inserted in the mouth, and  the patient was somewhat combative with each time we had to intubate her,  but we were able to pass the endoscope into the mouth and advance distally,  and found a large foreign body of meat in the distal esophagus.  First using  the [employer], we attempted to ensnare this, but were able to  break off only pieces of it, which we removed through the mouth.  We  reinserted the endoscope and switched to using a tripod three-pronged  instrument to grasp this meat, and subsequently we were able to grasp enough  of this, in the center of it, to pull it out.  There was some hesitation as  it came through the upper esophageal sphincter, but we were able to, in  fact, keep it in the tripod and remove it.  Subsequently, it was  approximately the length of my thumb, which is probably almost 2 inches  long, and we retrieved it whole.  We then subsequently reinserted the  endoscope into the mouth and passed distally.  There was minimal irritation  at the level of the distal esophagus, where the food had been lodged, but we  were able to advance easily the endoscope into the stomach.  Fundus, body,  antrum, duodenal bulb, second portion of duodenum were visualized.  From  this point the endoscope was slowly withdrawn, taking circumferential views  of duodenal mucosa until the endoscope  had been pulled back into the  stomach, placed in retroflexion to view the stomach from below.  The  endoscope was then straightened and withdrawn.  The patient's vital signs  and pulse oximeter remained stable.  The patient tolerated procedure well  without apparent complications.   FINDINGS:  Dysphagia with steak lodged in the distal esophagus, removed by  endoscopic means.   PLAN:  Strongly cautioned the patient about better chewing technique and/or  cutting of her meats especially in the future, and also discussed this with  family members present to help avoid a recurrence of this clinical problem  Other follow-up is as arranged and suggested by Dr. Christella Hartigan           ______________________________  Georgiana Spinner, M.D.     GMO/MEDQ  D:  03/28/2006  T:  03/28/2006  Job:  [id number]   cc:   Rachael Fee, M.D.

## 2011-02-02 NOTE — Discharge Summary (Signed)
[name], [name]                ACCOUNT NO.:  [id number]   MEDICAL RECORD NO.:  [id number]          PATIENT TYPE:  INP   LOCATION:  2017                         FACILITY:  [facility]   PHYSICIAN:  Quita Skye. Hart Rochester, M.D.  DATE OF BIRTH:  1946/03/02   DATE OF ADMISSION:  04/23/2005  DATE OF DISCHARGE:  04/25/2005                                 DISCHARGE SUMMARY   PRIMARY ADMITTING DIAGNOSIS:  Right lower extremity claudication symptoms.   ADDITIONAL/DISCHARGE DIAGNOSES:  1.  Right femoral to popliteal occlusive disease.  2.  Right lower extremity claudication.  3.  History of previous left femoral to posterior tibial bypass graft by Dr.      Hart Rochester in 2002.  4.  History of PTA of right superficial femoral artery in 2005.  5.  History of removal of breast tumor.  6.  History of hypertension.  7.  Coronary artery disease status post PTCA.  8.  History of arthritis.  9.  Gastroesophageal reflux.   HISTORY:  The patient is a 65 year old female well-known to [facility] who has  previously undergone a left SFA to posterior tibial bypass in 2002 as well  as a PTA of the right superficial femoral artery in 2005.  She recently has  began to experience recurrent claudication symptoms in her right calf as  well as discomfort in her right third, fourth, and fifth toes.  She was seen  by Dr. Hart Rochester and was found to have change in her ankle brachial index on  the right down from 100% to 0.62 on the right.  Her left SFA to PT bypass is  functioning appropriately with ABI of 100%.  Dr. Hart Rochester performed an  arteriogram and felt that the patient would benefit from right femoral to  popliteal bypass graft at this time.  She was scheduled for outpatient  admission on April 23, 2005 for surgery.   HOSPITAL COURSE:  [name] underwent right femoral to popliteal bypass  graft on April 23, 2005 by Dr. Hart Rochester.  She tolerated the procedure well and  was transferred to the floor in stable condition.   Postoperatively she has  done very well.  She has been ambulating in the halls without difficulty.  Her surgical incision sites are all healing well.  She has strong lower  extremity pulses.  Her laboratories have remained stable with a hemoglobin  of 9.29, hematocrit 26.8, platelets 255, white count 11.8.  Sodium 138,  potassium 3.9, BUN 5, creatinine 0.9.  She is tolerating a regular diet and  is having normal bowel and bladder function.  Her postoperative ABIs are  pending and it is felt that if she remains stable that she will be ready for  discharge home later on this afternoon.   DISCHARGE MEDICATIONS:  1.  Tylox one to two q.4h. p.r.n. pain.  2.  Multivitamin daily.  3.  Calcium 500 mg daily.  4.  Aspirin 325 mg daily.  5.  Hydroxyzine 25 mg daily.  6.  Benicar 40 mg daily.  7.  Lexapro 20 mg daily.  8.  Premarin 1.25 mg  daily.  9.  Clonazepam 0.5 mg q.h.s.  10. Protonix 40 mg daily.  11. HCTZ 25 mg daily.   DISCHARGE INSTRUCTIONS:  She is asked to refrain from driving, heavy  lifting, or strenuous activity.  She will continue ambulating daily and  using her incentive spirometer.  She may shower daily and clean her  incisions with soap and water.   DISCHARGE FOLLOW-UP:  She will see Dr. Hart Rochester back in the office in three  weeks with repeat ABIs.  Our office will contact her with this appointment.  She is asked to call if she experiences any problems in the interim.      Gi   GC/MEDQ  D:  04/25/2005  T:  04/25/2005  Job:  [id number]   cc:   [facility] office   Laurell Roof, M.D.  Int Medicine Resident - [address]  [place], [place] [address]  Fax: [phone]   Olga Millers, M.D. [facility]

## 2011-02-02 NOTE — Op Note (Signed)
NAME:  [name], [name]                          ACCOUNT NO.:  [id number]   MEDICAL RECORD NO.:  [id number]                   PATIENT TYPE:  INP   LOCATION:  5708                                 FACILITY:  [facility]   PHYSICIAN:  Janetta Hora. Fields, MD               DATE OF BIRTH:  06/20/1946   DATE OF PROCEDURE:  12/07/2003  DATE OF DISCHARGE:                                 OPERATIVE REPORT   PROCEDURE:  Aortogram with bilateral lower extremity run-off.   PREOPERATIVE DIAGNOSIS:  Atheroembolic disease, right foot.   POSTOPERATIVE DIAGNOSIS:  Atheroembolic disease, right foot.   ANESTHESIA:  Local.   INDICATIONS:  The patient has a history of atheroembolic phenomena to the  right foot and has rest pain in the right foot.   OPERATIVE DETAILS:  After obtaining informed consent, the patient was taken  to the angio suite.  The patient was placed in the supine position on the  angio table.  Next, both groins were prepped and draped in the usual sterile  fashion.  Local anesthesia was infiltrated over the right common femoral  artery.  A Majestic needle was used to cannulate the right common femoral  artery.  A 0.035 J-tip guide wire was placed into the abdominal aorta under  fluoroscopic guidance.  The needle was removed and a 5 French sheath placed  over the guide wire in the right common femoral artery.  A 5 French pigtail  catheter was then threaded over the guide wire in the abdominal aorta.  Abdominal aortogram was obtained, which shows bilateral patent renal  arteries.  There is an accessory renal artery on the right side.  The  abdominal aorta has moderate to severe atherosclerotic changes but no focal  stenosis.  The left and right common iliac arteries are widely patent.  There is a left external iliac artery stent which takes off just at the  iliac bifurcation, and this is widely patent.  The right and left internal  iliac arteries were widely patent.  The right external iliac  artery was  widely patent.  The origins of the profunda femoris and superficial femoral  arteries were widely patent bilaterally.  There were two focal stenoses in  the mid section of the right superficial femoral artery but these did not  feel ulcerated or the likely source of atheroemboli.  There was a left  superficial femoral artery at a posterior tibial bypass, and its origin is  widely patent.  The left distal superficial femoral artery is occluded and  reconstitutes as the above-knee popliteal artery.  On the right side, the  anterior tibial, posterior tibial, and tibial peroneal trunk and peroneal  arteries are widely patent.  The bypass on the left side to the posterior  tibial artery is widely patent.  There is retrograde filling of the  tibioperoneal trunk and down the peroneal artery.  The bypass is anastomosed  to a small posterior tibial artery.  Some motion artifact on the films near  the foot, but there is three-vessel run-off into the right lower extremity.  On the left side, the dominant vessel to the foot is the left posterior  tibial artery on the left side, and again on the right side there is three-  vessel run-off to the foot.   Next, a guide wire was placed back to the pigtail catheter and these were  removed as a unit.  The 5 French sheath was then removed and hemostasis  obtained with direct pressure.  The patient tolerated the procedure well.  There were no complications.   OPERATIVE FINDINGS:  1. Stenosis of right superficial femoral artery in two locations     approximately 75%.  2. Widely patent left superficial femoral to posterior tibial bypass.  3. Moderate to severe atherosclerotic changes of the distal abdominal aorta.                                               Janetta Hora. Fields, MD    CEF/MEDQ  D:  12/07/2003  T:  12/09/2003  Job:  [id number]

## 2011-02-02 NOTE — Op Note (Signed)
[name], [name]                          ACCOUNT NO.:  [id number]   MEDICAL RECORD NO.:  [id number]                   PATIENT TYPE:  INP   LOCATION:  5708                                 FACILITY:  [facility]   PHYSICIAN:  Quita Skye. Hart Rochester, M.D.               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   PROCEDURE:  1. Right superficial femoral angiogram via a left common femoral approach.  2. PTA of two superficial femoral artery stenotic lesions using a 4 mm x 8     cm Powerflex catheter at 10 atmospheres for 45 seconds and a second     inflation of 8 atmospheres for 90 seconds with completion of angiogram.   SURGEON:  Quita Skye. Hart Rochester, M.D.   ANESTHESIA:  Local.   CONTRAST:  65 cc.   COMPLICATIONS:  None.   DESCRIPTION OF PROCEDURE:  The patient was taken to the [facility]  Peripheral Endovascular [facility], placed in the supine position at which  time, both groins were prepped with Betadine scrub and solution, draped in a  routine sterile manner.  After infiltration with 1% Xylocaine, the left  common femoral artery was entered percutaneously.  A guide wire was passed  into the suprarenal aorta under fluoroscopic guidance.  A 5 French sheath  and dilator were passed over the guide wire, the dilator removed and IMA  catheter was then used to cannulate the right common iliac artery.  The  guide wire was then advanced down into the right common femoral and right  superficial femoral artery through the IMA catheter.  The IMA catheter was  removed and exchanged for a 6 French Terumo sheath and 4000 units of heparin  given  intravenously.  The sheath was advanced into the proximal superficial  femoral artery.  The dilator was removed.  The right lower extremity  angiogram was performed through the sheath.  This revealed the two stenotic  lesions in the mid thigh and the superficial femoral artery.  These were  marked using a __________ marker.  Both of these were about 1 to 2 cm in  length and they were separated by a distance of 4-5 cm.  It was decided to  treat these with primary angioplasty with one balloon and therefore a 4 mm x  8 cm Powerflex catheter was obtained, advanced over the guide wire and  positioned appropriately.  A single inflation at 10 atmospheres for 45  seconds was initially employed and a completion angiogram revealed a good  cosmetic result with some mild irregularity.  A second inflation  at 8  atmospheres for 90 seconds was performed and a second completion angiogram  revealed a good cosmetic result with no evidence of dissection or  intraluminal thrombus.  Following this, the angioplasty catheter was removed  followed by removal of the sheath and guide wire after the heparin had been  reversed.  Adequate compression applied.  No complications ensued.   FINDINGS:  1. Two stenotic lesions in the right mid superficial femoral artery     approximating 60 and 80% in severity.  2. Successful primary PTA of right superficial femoral artery stenoses with     a 4 x 8 Powerflex catheter at 10 atmospheres for 45 seconds and a second     inflation at 8 atmospheres for 90 seconds with completion angiogram.                                               Quita Skye. Hart Rochester, M.D.    JDL/MEDQ  D:  12/09/2003  T:  12/10/2003  Job:  [id number]

## 2011-02-02 NOTE — Op Note (Signed)
NAMEDANAI, GOTTO NO.:  [id number]   MEDICAL RECORD NO.:  [id number]          PATIENT TYPE:  EMS   LOCATION:  MAJO                         FACILITY:  [facility]   PHYSICIAN:  Georgiana Spinner, M.D.    DATE OF BIRTH:  December 05, 1945   DATE OF PROCEDURE:  DATE OF DISCHARGE:                                 OPERATIVE REPORT   __________ .  __________ .  She came in __________  seen by the emergency  room physician, who was unaware of who Dr. Christella Hartigan was and sent her home  after giving her Glucagon.  She felt somewhat better and went back home  __________ .  They brought her back to the emergency room for evaluation.   PAST MEDICAL HISTORY:  Notable for hypertension, coronary artery disease,  and peripheral vascular disease with coronary stents and peripheral vascular  stents.  __________ a lot of discomfort.  __________ .   Past medical history, review of systems, family history, social history  unremarkable.  See the chart for further details.   PHYSICAL EXAMINATION:  GENERAL:  Currently, she is a well-developed female,  comfortable.  VITAL SIGNS:  Stable.  She is afebrile.  HEENT:  Without jaundice.  NECK:  No bruits were heard.  LUNGS:  Clear.  HEART:  Regular rhythm without murmur, rub or gallop appreciated.  ABDOMEN:  __________ .           ______________________________  Georgiana Spinner, M.D.     GMO/MEDQ  D:  03/20/2006  T:  03/20/2006  Job:  [id number]

## 2011-02-02 NOTE — Discharge Summary (Signed)
[name], [name]                ACCOUNT NO.:  [id number]   MEDICAL RECORD NO.:  [id number]          PATIENT TYPE:  INP   LOCATION:  2001                         FACILITY:  [facility]   PHYSICIAN:  Francisca December, M.D.  DATE OF BIRTH:  1946/01/18   DATE OF ADMISSION:  10/19/2008  DATE OF DISCHARGE:  10/24/2008                               DISCHARGE SUMMARY   DISCHARGE DIAGNOSES:  1. Acute anterior myocardial infarction status post stent placement to      the left anterior descending (MULTI-LINK VISION, XIENCE V).  2. Severe left ventricular dysfunction, ejection fraction 20-25%.  3. Transient renal insufficiency, taken off angiotensin-converting      enzyme inhibitor.  4. Chronic obstructive pulmonary disease.  5. Tobacco abuse, smoking cessation counseling.  6. Peripheral vascular disease status post left femoral-posterior      tibial bypass graft in 2002, right superficial femoral artery      percutaneous transluminal angioplasty.  7. Small saccular infrarenal abdominal aortic aneurysm.  8. Mild aortic insufficiency.  9. Mild mitral regurgitation.  10.Hypertension.  11.Benign positional vertigo.  12.Postmenopausal.  13.Arthritis.  14.History of gastritis.  15.Family history of coronary artery disease.  16.Long-term medication therapy.   HOSPITAL COURSE:  [name] is a 65 year old female with a known  history of coronary artery disease who has a previous stent to right  coronary artery.  She presented complaining of a 3-day history of  intermittent substernal chest pain, but on the day of admission, she was  walking into stairs and developed acute substernal chest pain, nausea,  and vomiting.  EMS was summoned and EKG showed acute ST segment  elevation in the anterior leads with inferior ST-segment depression.  She was brought to the Cath Lab for urgent intervention.   She was found to have 100% ostial RCA occlusion at the stent site.  The  left circumflex was widely  patent.  The LAD was occluded proximally, and  this area was the suspected site of infarct.  A MULTI-LINK VISION stent  and XIENCE V stent was implanted into the LAD without difficulty.  Of  note, she had LV gram that showed severe LV dysfunction with large  anterolateral and inferior apical akinesis with an EF of 20-25%.   We attempted to add an ACE inhibitor, but her creatinine climbed to 2.2  and this had to be stopped; therefore, she was not on ACE therapy.  By  discharge, her creatinine had dropped down to 1.34 with gentle hydration  and being off of ACE inhibitors.  There was also a concern that she had  had some IV contrast and this may have added to the renal insufficiency.   We watched her over the next several days, and by October 24, 2008, we  felt she was ready for discharge to home.   Other lab studies during her hospital stay included PT of 15.1, INR of  1.1, sodium 134, potassium 4.5, BUN 19, creatinine 1.34.  Hemoglobin  9.2, hematocrit 27.0, white count 12.6 (reactive), platelets 152.  Maximum CK 5423 with 740.6 MB fraction.  Troponin was greater  than 100.  Chest x-ray showed mild central bronchitic changes.  No acute findings.   DISCHARGE MEDICATIONS:  1. Enteric-coated aspirin 325 mg a day.  2. Plavix 75 mg a day.  3. Metoprolol ER 25 mg a day.  4. Protonix 40 mg a day.  5. Simvastatin 40 mg a day.  6. Sublingual nitroglycerin p.r.n. chest pain.  7. Hydrocodone p.r.n. back pain.   The patient is discharged to home in stable but improved condition.  She  is to remain on low-sodium, heart-healthy diet.  Activity no  restrictions.  Follow up with Dr. Amil Amen in 1 week.  She is to call for  this appointment.      [name], P.A.      Francisca December, M.D.  Electronically Signed    LB/MEDQ  D:  12/02/2008  T:  12/03/2008  Job:  [id number]   cc:   Holley Bouche, M.D.

## 2011-02-02 NOTE — Discharge Summary (Signed)
[name], [name]                          ACCOUNT NO.:  [id number]   MEDICAL RECORD NO.:  [id number]                   PATIENT TYPE:  INP   LOCATION:  0363                                 FACILITY:  [facility]   PHYSICIAN:  Ermalene Searing. Leander Rams, M.D.                DATE OF BIRTH:  1945/12/14   DATE OF ADMISSION:  01/11/2003  DATE OF DISCHARGE:  01/13/2003                                 DISCHARGE SUMMARY   DISCHARGE DIAGNOSES:  1. Coronary artery disease.     A. Status post cardiac catheterization (01/13/03).  Ostial right coronary        artery showing 80% stenosis.  Status post angioplasty and successful        stent placement, small localized (saccular) aneurysm of the mid-        segment of the infrarenal abdominal aorta, ejection fraction 65%.     B. Echocardiogram:  Ejection fraction  55-65%, mild aortic regurgitation,        mild mitral regurgitation.  2. Dizziness of uncertain etiology:  Empiric treatment for benign positional     vertigo with meclizine.     A. Head CT scan negative for acute bleeding.     B. Head MRI negative for acute stroke.     C. Carotid Doppler negative for significant internal carotid artery        stenosis.  3. Difficulty walking.   PAST MEDICAL HISTORY:  1. Hypertension.  2. Status post femoropopliteal bypass on the left lower extremity.   HISTORY OF PRESENTATION:  The patient is a 65 year old female with history  of hypertension and peripheral vascular disease, status post femoropopliteal  bypass on the left lower extremity, who presented with 2-3 months' history  of exertional dyspnea.  The patient reported getting short of breath after  going up a flight of stairs. Also, the patient had chest pain with exertion,  occasionally associated with nausea.  On the day of admission, the patient  complained of an acute spell of dizziness but not true vertiginous  sensation. The patient felt that she was leaning toward the right when she  walked.   HOSPITAL COURSE:  DIAGNOSES #1 -  CORONARY ARTERY DISEASE:  Due to the  exertional feature of patient's dyspnea and chest pain, angina was highly  suspected upon admission.  The serial EKG and cardiac enzymes did not show  any signs of acute coronary syndrome. Cardiology consult was obtained.  Given patient's risk factor and peripheral vascular disease, they decided to  proceed with cardiac catheterization. This showed 80% ostial RCA lesion  which was angioplastied and stented.  The patient did not have any  complications throughout this admission related to coronary artery disease,  such as arrhythmia and congestive heart failure.   DIAGNOSES #2 - DIZZINESS OF UNCERTAIN ORIGIN:  Initially, work-up was done  to rule out any acute stroke with head CT, head MRI,  carotid Doppler. These  did not show any evidence of acute stroke.  Meclizine was started for  empiric treatment of labyrinthitis or benign positional vertigo.  The  patient said dizziness spell was thought not to be related with the  patient's coronary artery disease.   CONDITION ON DISCHARGE:  Stable.   DISPOSITION:  To home.   DISCHARGE MEDICATIONS:  1. Aspirin 325 mg p.o. daily.  2. Plavix 75 mg p.o. daily for 9 months.  3. Premarin 0.625 mg p.o. daily.  4. Altace 5 mg p.o. b.i.d.  5. Norvasc 5 mg p.o. b.i.d.  6. Atenolol 75 mg p.o. daily.  7. Sublingual nitroglycerin p.r.n.  8. Meclizine 25 mg p.o. q.6h. p.r.n.  9. Phenergan 12.5 mg suppository q.6h. p.r.n.   FOLLOW UP CARE ARRANGEMENTS:  1. The patient will follow up with Dr. Amil Amen (cardiologist) on 5/13.  2. With Dr. Tiburcio Pea (primary care physician) in 1-2 weeks.                                               Ermalene Searing. Leander Rams, M.D.    MSR/MEDQ  D:  01/26/2003  T:  01/26/2003  Job:  [id number]

## 2011-02-02 NOTE — Discharge Summary (Signed)
[name], [name]                ACCOUNT NO.:  [id number]   MEDICAL RECORD NO.:  [id number]          PATIENT TYPE:  INP   LOCATION:  2001                         FACILITY:  [facility]   PHYSICIAN:  Francisca December, M.D.  DATE OF BIRTH:  12-16-1945   DATE OF ADMISSION:  10/19/2008  DATE OF DISCHARGE:  10/24/2008                               DISCHARGE SUMMARY   DISCHARGE DIAGNOSES:  1. ST segment elevated myocardial infarction, anterior myocardial      infarction.  2. Hypertension.  3. Peripheral vascular disease.  4. Arthritis.  5. Gastritis.  6. Renal insufficiency.  7. Unilateral renal artery stenosis.  8. Transient elevated LFTs.  9. Ischemic cardiomyopathy, EF 20-25%.   HOSPITAL COURSE:  [name] is a 65 year old female who has a known  coronary artery disease, had a previous stent to the right coronary  artery.  She complains a 3-day history of intermittent chest pain and  then while walking into    DICTATION ENDS HERE      [name], P.A.      Francisca December, M.D.  Electronically Signed    LB/MEDQ  D:  12/22/2008  T:  12/23/2008  Job:  [id number]   cc:   Holley Bouche, M.D.

## 2011-02-02 NOTE — H&P (Signed)
[facility]  Patient:    GALILEA, QUITO                         MRN: [id number] Adm. Date:  10/07/00 Attending:  Quita Skye. Hart Rochester, M.D. Dictator:   Marlowe Kays, P.A. CC:         Arvella Merles, M.D.   History and Physical  DATE OF BIRTH:  Feb 01, 1946  CHIEF COMPLAINT:  Left peripheral vascular occlusive disease.  HISTORY OF PRESENT ILLNESS:  Ms. Mcintire is a pleasant 65 year old white female, referred by Dr. Santo Held for an evaluation of peripheral vascular disease.  The patient began experiencing claudication symptoms since November 2001, after walking about 100 feet, before her leg would "give up," feeling somewhat numb, requiring her to stop walking.  No symptoms on the right leg. Dopplers revealed severe distal popliteal versus tibial/peroneal trunk disease on the left, with normal flow on the right.  She underwent an arteriogram which revealed occlusion of the left popliteal artery at the knee joint, with reconstitution of the very distal tibial/peroneal trunk, with all three tibial vessels being opened to the ankle, with the best being the posterior tibial artery.  Upon these findings, Dr. Hart Rochester recommended a left femoral/popliteal bypass graft as the best management.  No back or hip pain.  No thigh pain or calf pain.  She has foot pain with ambulation.  No rest or night pain.  No nonhealing ulcers.  No gangrene or ischemic changes.  No peripheral edema.  No decrease in temperature.  No shortness of breath or dyspnea on exertion after walking.  No chest pain or palpitations.  PAST MEDICAL HISTORY: 1. PVOD. 2. Hypertension. 3. History of BCC of the nose. 4. Arthritis. 5. History of migraine headaches. 6. History of cholecystitis.  PAST SURGICAL HISTORY: 1. Post left first and fifth toe surgery in October 2001, Dr. Stacie Acres. 2. Status post arteriogram, Dr. Hart Rochester on September 25, 2000.  CURRENT MEDICATIONS:  Atenolol, unknown dose  p.o. q.d.  ALLERGIES:  No known drug allergies.  REVIEW OF SYSTEMS:  See the HPI and the past medical history for significant positives.  No diabetes mellitus, kidney disease, or asthma.  Her only complaint is her pain in her left foot.  FAMILY HISTORY:  Mother died at age 81, unknown causes.  Father died at age 28, of coronary artery disease.  SOCIAL HISTORY:  The patient is married.  She has one grown child.  She works at the [employer].  She smokes 1/2 pack a day of cigarettes, for the last 36 years.  She denies any alcohol intake.  PHYSICAL EXAMINATION:  GENERAL:  This is a 65 year old white female, in no acute distress, alert and oriented x 3.  VITAL SIGNS:  Blood pressure 160/90, pulse 74, respirations 18.  HEENT:  Normocephalic, atraumatic.  PERRLA.  EOMI.  No cataracts, glaucoma, or macular degeneration.  NECK:  Supple, no jugular venous distention.  No bruits.  There is a left precervical mobile nontender discrete lymph node palpable of about 2.0 cm. No other lymphadenopathy.  CHEST:  Symmetrical on inspiration.  Lungs clear to auscultation bilaterally.  CARDIOVASCULAR:  A regular rate and rhythm, no rubs, murmurs, or gallops.  ABDOMEN:  Soft, nontender.  Bowel sounds x 4.  No masses or bruits.  GENITOURINARY:  Deferred.  RECTAL:  Deferred.  EXTREMITIES:  No cyanosis, clubbing, or edema.  The left foot has decreased temperature.  Otherwise  the rest of the body has warm temperature.  SKIN:  A well-healed foot scar, but no ulcerations.  PERIPHERAL PULSES:  Carotids 2+ bilaterally.  Femorals 2+ bilaterally. Popliteals 1+ on the left, 2+ on the right.  Dorsalis pedis is absent on the left, 2+ on the right.  NEUROLOGIC:  Nonfocal.  Gait steady.  Deep tendon reflexes 2+ bilaterally. Muscles strength 5/5.  ASSESSMENT/PLAN:  Left leg claudication, for left femoral/popliteal bypass graft on October 07, 2000, at [facility].  Dr. Hart Rochester  has seen and evaluated this patient prior to the admission, and has explained the risks and benefits involving the procedure, and the patient has agreed to continue. DD:  10/03/00 TD:  10/03/00 Job: [id number]

## 2011-02-02 NOTE — Discharge Summary (Signed)
**Note [name]** [name], [name]                          ACCOUNT NO.:  [id number]   MEDICAL RECORD NO.:  [id number]                   PATIENT TYPE:  INP   LOCATION:  5708                                 FACILITY:  [facility]   PHYSICIAN:  Quita Skye. Hart Rochester, M.D.               DATE OF BIRTH:  Feb 03, 1946   DATE OF ADMISSION:  12/06/2003  DATE OF DISCHARGE:  12/10/2003                                 DISCHARGE SUMMARY   PRIMARY ADMITTING DIAGNOSIS:  Right foot pain.   ADDITIONAL/DISCHARGE DIAGNOSES:  1. Ischemic right foot.  2. Atheroembolic peripheral vascular occlusive disease, right lower     extremity.  3. History of peripheral vascular disease, status post previous left lower     extremity bypass.  4. Coronary artery disease status post PTCA and stent placement.  5. History of mild aortic and mild mitral regurgitation.  6. 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  7. Hypertension.   PROCEDURES PERFORMED:  1. Aortogram with bilateral lower extremity run-off.  2. Right femoral angiogram via left common femoral artery.  3. PTA of right superficial femoral artery.   HISTORY:  The patient is a 65 year old female well known to [facility].  She has a  history of peripheral vascular occlusive disease and is status post a left  femoral to popliteal bypass graft performed by Dr. Jerilee Field in January  of 2002.  Since January of 2005, however, she has had symptoms of burning in  her right toes.  This is more or less constant, but over the course of the  several days prior to this admission, she developed worsening pain with  color change in her right third through fifth toes.  She was seen in the  emergency department and evaluated by Dr. Gretta Began, who felt that she had  an atheroembolic event to her right foot.  She was treated with pain  medication, and was scheduled for follow up with Dr. Hart Rochester in the office.  However, before that time, she developed  intractable pain that was not  relieved by Tylox.  She was subsequently seen by Dr. Liliane Bade of the [facility]  office, and he recommended admission to [facility] for pain control  and further work-up to include angiography.   HOSPITAL COURSE:  She was admitted to [facility] on December 06, 2003.  She was treated with p.o. pain medication, in addition to supplemental  morphine.  Dr. Hart Rochester saw the patient and evaluated her, and she underwent a  CT of the abdomen and pelvis.  This showed no acute abnormalities.  She also  underwent a 2-D echocardiogram in order to evaluate for possible cardiac  source for her embolic disease.  Her echocardiogram showed left ventricular  ejection fraction estimated between 55% and 65% with no left ventricular  regional wall motion abnormality.  She had mild dilation  of the right atrium  and the right ventricle, but no identifiable cardiac source for emboli.  On  December 07, 2003, she underwent an aortogram with bilateral lower extremity  run-off which was performed by Dr. Fabienne Bruns.  She was found to have  moderate to severe atherosclerotic changes of the distal abdominal aorta  with stenosis of the right superficial femoral artery in two locations at  approximately 75%, as well as a widely patent left superficial femoral to  posterior tibial bypass graft.  She continued to have ischemic changes of  her right third, fourth, and fifth toes with increased pain.  It was Dr.  Candie Chroman opinion that she should undergo at least an attempted angioplasty  of her right superficial femoral artery in order to prevent limb loss and to  relieve her pain symptoms.  She was returned to the peripheral vascular lab  on December 09, 2003, and underwent right femoral angiogram with a PTA of the  right superficial femoral artery.  She tolerated the procedure well, and was  returned to the store in stable condition.  Her postprocedure ankle brachial  indices were  greater than 1.0 bilaterally.  She pre-procedure ABI on the  right was 0.87.  Since the procedure, her pain has improved significantly.  She is presently off all IV or IM narcotics, and is taking only Tylox by  mouth.  She will be mobilized later in the day today, and, if she remains  stable over the next 24 hours, she will hopefully be ready for discharge  home on December 10, 2003.   DISCHARGE MEDICATIONS:  1. Plavix 75 mg daily.  2. Enteric-coated aspirin 81 mg daily.  3. Benicar 40 mg daily.  4. Premarin 1.25 mg daily.  5. Atenolol 75 mg daily.  6. Hydroxyzine at bedtime as needed.  7. Tylox 1-2 q.4h. p.r.n. for pain.   DISCHARGE INSTRUCTIONS:  She is to refrain from heavy lifting or strenuous  activity.  She may continue daily walking and use of her incentive  spirometer.  She is asked to shower daily.   DISCHARGE FOLLOW UP:  She will see Dr. Hart Rochester back in the office on Tuesday,  January 04, 2004, at 9 a.m.  She will undergo repeat ankle brachial indices at  that time.  In the interim, if she experiences any problems or has  questions, she is asked to call our office immediately.      Coral Ceo, P.A.                        Quita Skye Hart Rochester, M.D.    GC/MEDQ  D:  12/09/2003  T:  12/11/2003  Job:  [id number]   cc:   Holley Bouche, M.D.  [address]  [place], [place] [address]  Fax: [phone]

## 2011-02-02 NOTE — Op Note (Signed)
[name], [name]                ACCOUNT NO.:  [id number]   MEDICAL RECORD NO.:  [id number]          PATIENT TYPE:  OIB   LOCATION:  2899                         FACILITY:  [facility]   PHYSICIAN:  Quita Skye. Hart Rochester, M.D.  DATE OF BIRTH:  [birth year]   DATE OF PROCEDURE:  04/18/2005  DATE OF DISCHARGE:                                 OPERATIVE REPORT   PREOPERATIVE DIAGNOSIS:  Recurrent ischemia of three toes of the right foot  secondary to femoral popliteal occlusive disease.   POSTOPERATIVE DIAGNOSIS:  Recurrent ischemia of three toes of the right foot  secondary to femoral popliteal occlusive disease.   PROCEDURE:  1.  Abdominal aortogram with bilateral lower extremity run off via left      common femoral approach.  2.  Selective catheterization of right external iliac artery with right      lower extremity angiogram.   SURGEON:  Quita Skye. Hart Rochester, M.D.   ANESTHESIA:  Local Xylocaine and Versed 2 mg intravenously.   CONTRAST:  137 mL.   COMPLICATIONS:  None.   DESCRIPTION OF PROCEDURE:  The patient was taken to the [facility]  peripheral endovascular lab and placed in the supine position at which time  both groins were prepped with Betadine scrubbing solution and draped in  routine sterile manner.  After infiltration with 1% Xylocaine, the left  common femoral artery was entered percutaneously and a guide-wire was passed  into the suprarenal aorta under fluoroscopic guidance.  A 5 French sheath  and dilator were passed over the guide-wire, dilator removed, and a standard  pigtail catheter was positioned in the suprarenal aorta.  Flush abdominal  aortogram was performed injecting 20 mL of contrast at 20 mL per second.  This revealed the aorta to be widely patent.  There were two renal arteries  on the right and one on the left.  The inferior right renal artery was the  smallest and none had any severe, stenotic area.  The inferior mesenteric  artery was patent.  The catheter was  withdrawn into the terminal aorta and  bilateral lower extremity run off performed injecting 88 mL of contrast at 8  mL per second.  This revealed the aorta to be somewhat small but adequate in  caliber.  Both common, internal, and external iliac arteries were widely  patent.  On the left side, there was a superficial femoral to posterior  tibial bypass originating from the proximal superficial femoral artery with  the common femoral and profunda femoris being widely patent.  The vein graft  was anastomosed distally to the posterior tibial artery and there was  retrograde filling of the peroneal artery on the left side with no areas of  apparent severe stenosis.  On the right side, the common femoral, profunda  femoris, and proximal superficial femoral arteries were widely patent.  The  superficial femoral artery became diseased in the mid thigh with smaller  caliber than normal and there were two areas of stenosis with the most  distal being 95% severity over 1-2 cm with the plaque extending proximally  about 4-5  cm and another area of irregularity in this proximal portion.  The  above knee popliteal artery was widely patent and there was three vessel run  off to the posterior tibial, anterior tibial, and peroneal arteries.  The  pigtail catheter was removed and the right external iliac artery was  selectively cannulated using an IMA catheter and a Wholey wire and an Indole  catheter was positioned in the external iliac artery on the right.  Additional views of the right superficial femoral artery were obtained and  the run off using the peek hole technique.  Following this, the catheter was  removed, the sheath was removed, adequate compression applied, no  complications ensued.   FINDINGS:  1.  Widely patent aortoiliac system.  2.  Two renal arteries on the right, one on the left, all widely patent.  3.  Severe, recurrent stenosis of the right superficial femoral artery one      year  following PTA.  4.  Three vessel run off on the right with patent popliteal artery above and      below the knee.  5.  Patent left superficial femoral to posterior tibial saphenous vein graft      with two vessel run off to the posterior tibial and peroneal arteries.       JDL/MEDQ  D:  04/18/2005  T:  04/18/2005  Job:  [id number]

## 2011-02-02 NOTE — H&P (Signed)
[name], [name]                ACCOUNT NO.:  [id number]   MEDICAL RECORD NO.:  [id number]          PATIENT TYPE:  OIB   LOCATION:  2899                         FACILITY:  [facility]   PHYSICIAN:  Quita Skye. Hart Rochester, M.D.  DATE OF BIRTH:  04/18/1946   DATE OF ADMISSION:  04/18/2005  DATE OF DISCHARGE:                                HISTORY & PHYSICAL   CHIEF COMPLAINT:  Toe pain, right foot.   HISTORY OF PRESENT ILLNESS:  This is a 65 year old female well known to [facility]  with a long history of peripheral vascular occlusive disease. She is status  post left femoral-posterior-tibial bypass in 2002 by Dr.  Hart Rochester.  In March  2005, she underwent a right superficial femoral artery PTA for acute  ischemic changes. She initially had a good result with excellent  postprocedure ABIs of greater than 1.  Approximately two weeks ago, the  patient began to develop increasing symptoms, primarily in her right third  and fourth toes; however, pain is now present at rest in the entire leg.  She was seen and evaluated by Dr. Hart Rochester who recommended repeat  arteriography, and this was done on April 18, 2005.  This found severe right  superficial femoral artery occlusive disease with three-vessel runoff.  She  is agreeable to proceeding with revascularization and is scheduled for a  right femoral-popliteal bypass graft on April 23, 2005.   PAST MEDICAL HISTORY:  1.  History of phlebitis.  2.  Peripheral vascular occlusive disease as described above.  3.  History of small saccular abdominal aortic aneurysm.  4.  History of coronary artery disease status post PTCA.  5.  History of hypertension.  6.  History of arthritis.  7.  History of mild mitral regurgitation.  8.  History of mild aortic regurgitation.  9.  History of anal polyps.  10. History of hemorrhoids.  11. History of gastritis.  12. History of benign positional vertigo.   PAST SURGICAL HISTORY:  1.  Resection of cervical mass.  2.  Bilateral  tarsal tunnel.  3.  History of drainage of a left groin abscess.  4.  History of benign breast mass resection.  5.  Status post cholecystectomy.  6.  Status post hysterectomy.   ALLERGIES:  No known drug allergies.   CURRENT MEDICATIONS:  1.  Multivitamins 1 daily.  2.  Calcium.  3.  Aspirin 325 mg daily.  4.  Benicar 40 mg daily.  5.  Tylenol No. 3 p.r.n.  6.  Lexapro 20 mg daily.  7.  Premarin 1.25 mg daily.  8.  Clonazepam 0.5 mg q.h.s.  9.  Protonix 40 mg daily.  10. Hydrochlorothiazide 25 mg daily.  11. Hydroxyzine 25 mg q.h.s. p.r.n.   SOCIAL HISTORY:  She is married.  She has one child.  She has 30-year  smoking history, currently at one-half pack per day, and she is trying to  quit.  Alcohol use: None.   FAMILY HISTORY:  Mother is deceased.  She had known history of hypertension  and peripheral vascular occlusive disease.  Father deceased at age  71 from  myocardial infarction.  She has one brother who has had a previous stent in  his peripheral vascular system.   REVIEW OF SYMPTOMS:  Remarkable for GENERAL:  Fair health.  HEENT  EXAMINATION:  Frequent headaches.  PULMONARY EXAMINATION: Occasional  shortness of breath. CARDIAC EXAMINATION:  Occasional chest pain.  GASTROINTESTINAL:  Occasional reflux.  GENITOURINARY:  Unremarkable.  NEUROLOGIC: Unremarkable with the exception of headaches.  MUSCULOSKELETAL:  She doe shave arthritis symptoms, primarily in her shoulders, but she also  gets back pain. VASCULAR:  Unremarkable.  HEMATOLOGIC:  Unremarkable.  PSYCHIATRIC:  She is on Lexapro and clonazepam.   PHYSICAL EXAMINATION:  VITAL SIGNS: Blood pressure 107/59, heart rate 67,  respirations 16, temperature 96.9.  GENERAL:  A 65 year old Caucasian female in no acute distress, alert and  oriented.  She is lying in her hospital bed status post arteriogram.  HEENT:  Pupils equal, round, and reactive to light. Extraocular movements.  Oral mucosa pink and moist.  There are  no obvious lesions.  She has full  dentures.  NECK:  Full range of motion grossly.  She has a faint carotid bruit on the  right side.  No thyromegaly, no lymphadenopathy.  CHEST: Breath sounds are clear without wheeze, rhonchi, or rubs.  CARDIAC:  Regular rate and rhythm.  Normal S1 and S2.  There is a faint 1/6  systolic murmur.  ABDOMEN:  Somewhat rotund.  Bowel sounds are positive.  Soft, nontender, no  masses.  GENITOURINARY/RECTAL:  Exams deferred.  VASCULAR:  Examination reveals 2+ posterior tibial on the left.  Femoral  pulses are equal bilaterally.  The other pulses in the feet are absent.  NEUROLOGIC:  Alert and oriented.  Cranial nerves II-XII grossly intact.  Gait was not tested. Range of motion not tested.  Strength grossly equal and  intact bilaterally.   ASSESSMENT:  Severe right superficial femoral artery disease with rest pain  and cyanotic changes to her third and fourth toes.   Other diagnoses as listed per the history.   PLAN:  Right femoral-popliteal bypass on April 23, 2005.      [name]  D:  04/18/2005  T:  04/18/2005  Job:  [id number]

## 2011-02-07 ENCOUNTER — Encounter: Payer: Self-pay | Admitting: Internal Medicine

## 2011-02-07 ENCOUNTER — Ambulatory Visit (INDEPENDENT_AMBULATORY_CARE_PROVIDER_SITE_OTHER): Payer: Medicare Other | Admitting: Internal Medicine

## 2011-02-07 DIAGNOSIS — I2510 Atherosclerotic heart disease of native coronary artery without angina pectoris: Secondary | ICD-10-CM

## 2011-02-07 DIAGNOSIS — Z95810 Presence of automatic (implantable) cardiac defibrillator: Secondary | ICD-10-CM

## 2011-02-07 DIAGNOSIS — I428 Other cardiomyopathies: Secondary | ICD-10-CM

## 2011-02-07 DIAGNOSIS — I5022 Chronic systolic (congestive) heart failure: Secondary | ICD-10-CM

## 2011-02-07 MED ORDER — POTASSIUM CHLORIDE CRYS ER 20 MEQ PO TBCR: 20.0000 meq | EXTENDED_RELEASE_TABLET | Freq: Every day | ORAL | Status: DC

## 2011-02-07 NOTE — Progress Notes (Signed)
HPI [name] returns today for followup. She is a pleasant 65 year old woman with an ischemic cardiomyopathy status post anterior myocardial infarction. 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 She continues to smoke cigarettes now half-pack per day. She denies chest pain. She denies peripheral edema. She has had no recent ICD shocks. Allergies  Allergen Reactions  . Morphine     REACTION: makes me crazy     Current Outpatient Prescriptions  Medication Sig Dispense Refill  . allopurinol (ZYLOPRIM) 100 MG tablet Take 100 mg by mouth daily.        Marland Kitchen aspirin 81 MG tablet Take 81 mg by mouth daily.        . Cholecalciferol (VITAMIN D) 1000 UNITS capsule Take 1,000 Units by mouth daily.        . clonazePAM (KLONOPIN) 1 MG tablet Take 1 mg by mouth 2 (two) times daily as needed.        . clopidogrel (PLAVIX) 75 MG tablet Take 75 mg by mouth daily.        Marland Kitchen estrogens, conjugated, (PREMARIN) 0.625 MG tablet Take 0.625 mg by mouth daily. Take daily for 21 days then do not take for 7 days.       Marland Kitchen ezetimibe (ZETIA) 10 MG tablet Take 10 mg by mouth daily.        . ferrous sulfate 325 (65 FE) MG tablet Take 325 mg by mouth daily with breakfast.        . furosemide (LASIX) 80 MG tablet Take 80 mg by mouth daily.        Marland Kitchen HYDROcodone-acetaminophen (VICODIN) 5-500 MG per tablet Take 1 tablet by mouth as directed.        . metoprolol (TOPROL-XL) 50 MG 24 hr tablet Take 50 mg by mouth daily.        . mirtazapine (REMERON) 15 MG tablet Take 15 mg by mouth as needed.        . pantoprazole (PROTONIX) 40 MG tablet Take 40 mg by mouth daily.        . potassium chloride SA (K-DUR,KLOR-CON) 20 MEQ tablet Take 20 mEq by mouth daily.        . ramipril (ALTACE) 2.5 MG capsule Take 2.5 mg by mouth daily.        . ranitidine (ZANTAC) 300 MG tablet Take 300 mg by mouth 2 (two) times daily.        . rosuvastatin (CRESTOR) 20 MG tablet Take 20 mg by mouth daily.        Marland Kitchen zolpidem (AMBIEN) 10 MG  tablet Take 10 mg by mouth at bedtime as needed.           No past medical history on file.  ROS:   All systems reviewed and negative except as noted in the HPI.   No past surgical history on file.   No family history on file.   History   Social History  . Marital Status: Legally Separated    Spouse Name: N/A    Number of Children: N/A  . Years of Education: N/A   Occupational History  . Not on file.   Social History Main Topics  . Smoking status: Former Games developer  . Smokeless tobacco: Not on file  . Alcohol Use: Not on file  . Drug Use: Not on file  . Sexually Active: Not on file   Other Topics Concern  . Not on file   Social History Narrative  . No narrative  on file     BP 107/68  Pulse 66  Ht 5\' 4"  (1.626 m)  Wt 137 lb (62.143 kg)  BMI 23.52 kg/m2  Physical Exam:  Well appearing NAD HEENT: Unremarkable Neck:  No JVD, no thyromegally Lymphatics:  No adenopathy Back:  No CVA tenderness Lungs:  Clear. Well-healed ICD incision. HEART:  Regular rate rhythm, no murmurs, no rubs, no clicks Abd:  Flat, positive bowel sounds, no organomegally, no rebound, no guarding Ext:  2 plus pulses, no edema, no cyanosis, no clubbing Skin:  No rashes no nodules Neuro:  CN II through XII intact, motor grossly intact  DEVICE  Normal device function.  See PaceArt for details.   Assess/Plan:

## 2011-02-07 NOTE — Patient Instructions (Signed)
Your physician wants you to follow-up in: 12 months with Dr. Taylor You will receive a reminder letter in the mail two months in advance. If you don't receive a letter, please call our office to schedule the follow-up appointment. Remote monitoring is used to monitor your Pacemaker of ICD from home. This monitoring reduces the number of office visits required to check your device to one time per year. It allows us to keep an eye on the functioning of your device to ensure it is working properly. You are scheduled for a device check from home on May 10, 2011. You may send your transmission at any time that day. If you have a wireless device, the transmission will be sent automatically. After your physician reviews your transmission, you will receive a postcard with your next transmission date.   

## 2011-02-07 NOTE — Assessment & Plan Note (Signed)
Her symptoms are class II. She will continue her current medications. I have encouraged her to increase her walking.

## 2011-02-07 NOTE — Assessment & Plan Note (Signed)
She is currently having no anginal symptoms. I've asked her to increase her activity by walking. She will continue her current medications.

## 2011-02-07 NOTE — Assessment & Plan Note (Signed)
Her device is working normally. We'll recheck in several months. 

## 2011-02-26 ENCOUNTER — Other Ambulatory Visit: Payer: Self-pay | Admitting: *Deleted

## 2011-02-26 DIAGNOSIS — I2510 Atherosclerotic heart disease of native coronary artery without angina pectoris: Secondary | ICD-10-CM

## 2011-02-26 MED ORDER — METOPROLOL SUCCINATE ER 50 MG PO TB24: 50.0000 mg | ORAL_TABLET | Freq: Every day | ORAL | Status: DC

## 2011-02-28 ENCOUNTER — Other Ambulatory Visit: Payer: Self-pay | Admitting: *Deleted

## 2011-02-28 ENCOUNTER — Telehealth: Payer: Self-pay | Admitting: Internal Medicine

## 2011-02-28 ENCOUNTER — Encounter: Payer: Self-pay | Admitting: Internal Medicine

## 2011-02-28 DIAGNOSIS — I2510 Atherosclerotic heart disease of native coronary artery without angina pectoris: Secondary | ICD-10-CM

## 2011-02-28 MED ORDER — METOPROLOL SUCCINATE ER 50 MG PO TB24: 50.0000 mg | ORAL_TABLET | Freq: Every day | ORAL | Status: DC

## 2011-02-28 MED ORDER — CLOPIDOGREL BISULFATE 75 MG PO TABS: 75.0000 mg | ORAL_TABLET | Freq: Every day | ORAL | Status: DC

## 2011-02-28 NOTE — Telephone Encounter (Signed)
Pt needs plavix 75 mg refilled, said was requested last week , pt out of med now, uses [facility] [address]

## 2011-02-28 NOTE — Telephone Encounter (Signed)
PT NOTIFIED PLAVIX FILLED  AT [facility] [address]

## 2011-02-28 NOTE — Telephone Encounter (Signed)
pl[facility]ix [facility]. [facility] [address] [phone]

## 2011-03-30 ENCOUNTER — Telehealth: Payer: Self-pay | Admitting: Internal Medicine

## 2011-03-30 NOTE — Telephone Encounter (Signed)
[facility]ill [facility] d [facility] [address] [phone]

## 2011-03-30 NOTE — Telephone Encounter (Signed)
Spoke with patients brother and asked them to call Dr Tiburcio Pea to have the Vit D filled

## 2011-04-03 ENCOUNTER — Telehealth: Payer: Self-pay | Admitting: Internal Medicine

## 2011-04-03 NOTE — Telephone Encounter (Signed)
Per pt call, Pt needs [facility] [address]. Pt said she called Friday of last week. Please return pt call to confirm that this RX was called in.

## 2011-04-04 ENCOUNTER — Other Ambulatory Visit: Payer: Self-pay | Admitting: *Deleted

## 2011-04-04 DIAGNOSIS — I5022 Chronic systolic (congestive) heart failure: Secondary | ICD-10-CM

## 2011-04-04 MED ORDER — FUROSEMIDE 80 MG PO TABS: 80.0000 mg | ORAL_TABLET | Freq: Every day | ORAL | Status: DC

## 2011-04-04 NOTE — Telephone Encounter (Signed)
Per [name] Pt should call PCP, Pt understands

## 2011-04-10 ENCOUNTER — Other Ambulatory Visit: Payer: Self-pay | Admitting: Family Medicine

## 2011-04-10 DIAGNOSIS — R109 Unspecified abdominal pain: Secondary | ICD-10-CM

## 2011-04-11 ENCOUNTER — Other Ambulatory Visit: Payer: Self-pay | Admitting: Family Medicine

## 2011-04-11 ENCOUNTER — Ambulatory Visit
Admission: RE | Admit: 2011-04-11 | Discharge: 2011-04-11 | Disposition: A | Discharge status: Home / Self Care | Payer: Medicare Other | Source: Ambulatory Visit | Attending: Family Medicine | Admitting: Family Medicine

## 2011-04-11 DIAGNOSIS — R109 Unspecified abdominal pain: Secondary | ICD-10-CM

## 2011-05-01 ENCOUNTER — Ambulatory Visit (INDEPENDENT_AMBULATORY_CARE_PROVIDER_SITE_OTHER): Payer: Medicare Other | Admitting: Vascular Surgery

## 2011-05-01 ENCOUNTER — Encounter (INDEPENDENT_AMBULATORY_CARE_PROVIDER_SITE_OTHER): Payer: Medicare Other

## 2011-05-01 ENCOUNTER — Encounter: Payer: Self-pay | Admitting: Vascular Surgery

## 2011-05-01 VITALS — BP 117/64 | HR 58 | Resp 18 | Ht 64.0 in | Wt 138.0 lb

## 2011-05-01 DIAGNOSIS — Z48812 Encounter for surgical aftercare following surgery on the circulatory system: Secondary | ICD-10-CM

## 2011-05-01 DIAGNOSIS — I739 Peripheral vascular disease, unspecified: Secondary | ICD-10-CM

## 2011-05-01 DIAGNOSIS — I70219 Atherosclerosis of native arteries of extremities with intermittent claudication, unspecified extremity: Secondary | ICD-10-CM

## 2011-05-01 NOTE — Progress Notes (Signed)
Subjective:     Patient ID: [name], female   DOB: 1946-03-23, 65 y.o.   MRN: [id number]  HPI this 65 year old female is seen today for followup regarding her bilateral lower extremity vascular occlusive disease. She has had a previous right femoral-popliteal bypass graft, a left femoral to posterior tibial bypass graft, and a left-to-right femoral-femoral bypass. She denies calf claudication symptoms or rest pain at this time. She is having back pain which she thinks is due to a bulging disc. She denies any history of infection nonhealing ulcers gangrene or cellulitis.   Past Medical History  Diagnosis Date  . PVD (peripheral vascular disease)   . Back pain   . Arthritis   . Heart disease     History  Substance Use Topics  . Smoking status: Current Everyday Smoker -- 0.5 packs/day for 55 years    Types: Cigarettes  . Smokeless tobacco: Never Used  . Alcohol Use: No    Family History  Problem Relation Age of Onset  . Peripheral vascular disease Sister   . Heart disease Brother     Allergies  Allergen Reactions  . Morphine     REACTION: makes me crazy    Current outpatient prescriptions:allopurinol (ZYLOPRIM) 100 MG tablet, Take 100 mg by mouth daily.  , Disp: , Rfl: ;  aspirin 81 MG tablet, Take 81 mg by mouth daily.  , Disp: , Rfl: ;  Cholecalciferol (VITAMIN D) 1000 UNITS capsule, Take 1,000 Units by mouth daily.  , Disp: , Rfl: ;  clonazePAM (KLONOPIN) 1 MG tablet, Take 1 mg by mouth 2 (two) times daily as needed.  , Disp: , Rfl:  clopidogrel (PLAVIX) 75 MG tablet, Take 1 tablet (75 mg total) by mouth daily., Disp: 30 tablet, Rfl: 11;  estrogens, conjugated, (PREMARIN) 0.625 MG tablet, Take 0.625 mg by mouth daily. Take daily for 21 days then do not take for 7 days. , Disp: , Rfl: ;  ezetimibe (ZETIA) 10 MG tablet, Take 10 mg by mouth daily.  , Disp: , Rfl: ;  ferrous sulfate 325 (65 FE) MG tablet, Take 325 mg by mouth daily with breakfast.  , Disp: , Rfl:  furosemide  (LASIX) 80 MG tablet, Take 1 tablet (80 mg total) by mouth daily., Disp: 30 tablet, Rfl: 6;  HYDROcodone-acetaminophen (VICODIN) 5-500 MG per tablet, Take 1 tablet by mouth as directed.  , Disp: , Rfl: ;  metoprolol (TOPROL-XL) 50 MG 24 hr tablet, Take 1 tablet (50 mg total) by mouth daily., Disp: 30 tablet, Rfl: 11;  mirtazapine (REMERON) 15 MG tablet, Take 15 mg by mouth as needed.  , Disp: , Rfl:  potassium chloride SA (K-DUR,KLOR-CON) 20 MEQ tablet, Take 1 tablet (20 mEq total) by mouth daily., Disp: 30 tablet, Rfl: 11;  ramipril (ALTACE) 2.5 MG capsule, Take 2.5 mg by mouth daily.  , Disp: , Rfl: ;  ranitidine (ZANTAC) 300 MG tablet, Take 300 mg by mouth 2 (two) times daily.  , Disp: , Rfl: ;  rosuvastatin (CRESTOR) 20 MG tablet, Take 20 mg by mouth daily.  , Disp: , Rfl:  zolpidem (AMBIEN) 10 MG tablet, Take 10 mg by mouth at bedtime as needed.  , Disp: , Rfl: ;  pantoprazole (PROTONIX) 40 MG tablet, Take 40 mg by mouth daily.  , Disp: , Rfl:   Filed Vitals:   05/01/11 1107  Height: 5\' 4"  (1.626 m)  Weight: 138 lb (62.596 kg)    Body mass index is 23.69 kg/(m^2).  Review of Systems  Constitutional: Negative for fever, chills, activity change, appetite change, fatigue and unexpected weight change.  HENT: Negative for hearing loss, congestion, sore throat, trouble swallowing, neck pain and voice change.   Eyes: Negative for visual disturbance.  Respiratory: Negative for cough, chest tightness, shortness of breath, wheezing and stridor.   Cardiovascular: Negative for chest pain, palpitations and leg swelling.  Gastrointestinal: Negative for nausea, abdominal pain, diarrhea, constipation, blood in stool and abdominal distention.  Genitourinary: Negative for dysuria, urgency, frequency, hematuria and difficulty urinating.  Musculoskeletal: Positive for back pain and arthralgias. Negative for joint swelling and gait problem.  Skin: Negative for color change, pallor, rash and wound.    Neurological: Negative for dizziness, seizures, syncope, facial asymmetry, speech difficulty, weakness, light-headedness, numbness and headaches.  Hematological: Negative for adenopathy. Does not bruise/bleed easily.  Psychiatric/Behavioral: Negative for confusion.       Objective:   Physical Exam  Constitutional: She is oriented to person, place, and time. She appears well-developed and well-nourished.  HENT:  Head: Normocephalic.  Nose: Nose normal.  Mouth/Throat: Oropharynx is clear and moist.  Eyes: EOM are normal. Pupils are equal, round, and reactive to light. No scleral icterus.  Neck: Normal range of motion. Neck supple. No JVD present.  Cardiovascular: Normal rate, regular rhythm and normal heart sounds.   No murmur heard.      She has a 3+ right femoral popliteal and 2+ dorsalis pedis and posterior tibial pulse palpable. Left leg has a 2-3+ femoral pulse 2+ popliteal and 2+ posterior tibial pulse palpable. Both feet are well perfused.  Pulmonary/Chest: Effort normal and breath sounds normal. No stridor. No respiratory distress. She has no wheezes. She has no rales.  Abdominal: Soft. She exhibits no distension and no mass. There is no tenderness. There is no rebound and no guarding.  Musculoskeletal: Normal range of motion. She exhibits no edema and no tenderness.  Lymphadenopathy:    She has no cervical adenopathy.  Neurological: She is alert and oriented to person, place, and time. Coordination normal.  Skin: Skin is warm and dry. No rash noted. No erythema.  Psychiatric: She has a normal mood and affect. Her behavior is normal.       Assessment:     Today I ordered a bilateral lower extremity Doppler studies and a duplex scan of her left to right femoral-femoral bypass. ABIs are normal bilaterally with good biphasic flow. The femoral-femoral graft is unremarkable with good flow.    Plan:     Doing well post multiple vascular procedures. Will return in 1 year for  followup study in the vascular lab, she develops any new symptoms in the interim. She does have elevated velocity and left external iliac artery proximal to femoral anastomosis. If she develops left leg claudication symptoms may consider angiography and possible PTA and stenting of left external iliac artery.

## 2011-05-10 ENCOUNTER — Encounter: Payer: Medicare Other | Admitting: *Deleted

## 2011-05-11 NOTE — Procedures (Unsigned)
BYPASS GRAFT EVALUATION  INDICATION:  Followup left to right femoral to femoral bypass graft.  HISTORY: Diabetes:  No. Cardiac:  No. Hypertension:  Yes. Smoking:  No. Previous Surgery:  Left to right femoral to femoral bypass graft 04/07/2009.  Right femoral to popliteal bypass graft 04/23/2005.  Left SFA to PTA bypass graft 10/07/2000.  SINGLE LEVEL ARTERIAL EXAM                              RIGHT              LEFT Brachial:                    135                136 Anterior tibial:             140                134 Posterior tibial:            124                142 Peroneal: Ankle/brachial index:        1.03               1.04  PREVIOUS ABI:  Date:  11/22/2010  RIGHT:  0.91  LEFT:  0.93  LOWER EXTREMITY BYPASS GRAFT DUPLEX EXAM:  DUPLEX:  Patent femoral to femoral bypass graft. Left peak systolic velocity of 412 cm/s noted at the native distal external iliac artery/common femoral artery segment.  IMPRESSION: 1. Patent left common femoral artery to right common femoral artery     bypass graft. 2. Left native external iliac artery stenosis noted with a velocity of     412 cm/s. 3. Bilateral ankle brachial indices are within normal limits.  ___________________________________________ Quita Skye Hart [place], M.D.  EM/MEDQ  D:  05/01/2011  T:  05/01/2011  Job:  [id number]

## 2011-05-17 ENCOUNTER — Encounter: Payer: Self-pay | Admitting: *Deleted

## 2011-05-19 ENCOUNTER — Other Ambulatory Visit: Payer: Self-pay | Admitting: Internal Medicine

## 2011-05-31 ENCOUNTER — Other Ambulatory Visit: Payer: Self-pay | Admitting: Internal Medicine

## 2011-05-31 ENCOUNTER — Encounter: Payer: Self-pay | Admitting: Internal Medicine

## 2011-05-31 ENCOUNTER — Ambulatory Visit (INDEPENDENT_AMBULATORY_CARE_PROVIDER_SITE_OTHER): Payer: Medicare Other | Admitting: *Deleted

## 2011-05-31 DIAGNOSIS — I428 Other cardiomyopathies: Secondary | ICD-10-CM

## 2011-06-03 LAB — REMOTE ICD DEVICE
DEV-0020ICD: NEGATIVE
RV LEAD AMPLITUDE: 12 mv

## 2011-06-15 NOTE — Progress Notes (Signed)
ICD checked by remote. 

## 2011-06-20 ENCOUNTER — Encounter: Payer: Self-pay | Admitting: *Deleted

## 2011-08-10 ENCOUNTER — Other Ambulatory Visit: Payer: Self-pay | Admitting: Internal Medicine

## 2011-08-14 ENCOUNTER — Other Ambulatory Visit: Payer: Self-pay

## 2011-08-14 MED ORDER — ROSUVASTATIN CALCIUM 20 MG PO TABS: 20.0000 mg | ORAL_TABLET | Freq: Every day | ORAL | Status: DC

## 2011-08-30 ENCOUNTER — Encounter: Payer: Medicare Other | Admitting: *Deleted

## 2011-09-03 ENCOUNTER — Encounter: Payer: Self-pay | Admitting: *Deleted

## 2011-09-07 ENCOUNTER — Ambulatory Visit (INDEPENDENT_AMBULATORY_CARE_PROVIDER_SITE_OTHER): Payer: Medicare Other | Admitting: *Deleted

## 2011-09-07 DIAGNOSIS — I5022 Chronic systolic (congestive) heart failure: Secondary | ICD-10-CM

## 2011-09-07 DIAGNOSIS — I2589 Other forms of chronic ischemic heart disease: Secondary | ICD-10-CM

## 2011-09-08 ENCOUNTER — Encounter: Payer: Self-pay | Admitting: Internal Medicine

## 2011-09-08 ENCOUNTER — Other Ambulatory Visit: Payer: Self-pay | Admitting: Internal Medicine

## 2011-09-08 LAB — REMOTE ICD DEVICE
BRDY-0002RV: 40 {beats}/min
BRDY-0004RV: 120 {beats}/min
DEV-0020ICD: NEGATIVE
DEVICE MODEL ICD: 726698
RV LEAD AMPLITUDE: 12 mv
TZAT-0004SLOWVT: 8
TZAT-0012SLOWVT: 200 ms
TZAT-0013FASTVT: 1
TZAT-0018FASTVT: NEGATIVE
TZAT-0018SLOWVT: NEGATIVE
TZAT-0019FASTVT: 7.5 V
TZAT-0019SLOWVT: 7.5 V
TZON-0003FASTVT: 280 ms
TZON-0003SLOWVT: 335 ms
TZON-0004FASTVT: 16
TZON-0004SLOWVT: 30
TZON-0005FASTVT: 6
TZON-0010SLOWVT: 80 ms
TZST-0001FASTVT: 2
TZST-0001SLOWVT: 3
TZST-0003FASTVT: 36 J
TZST-0003FASTVT: 40 J
TZST-0003FASTVT: 40 J
TZST-0003SLOWVT: 36 J

## 2011-09-12 NOTE — Progress Notes (Signed)
ICD remote 

## 2011-09-18 HISTORY — PX: EYE SURGERY: SHX253

## 2011-09-19 ENCOUNTER — Encounter: Payer: Self-pay | Admitting: *Deleted

## 2011-11-22 ENCOUNTER — Observation Stay (HOSPITAL_COMMUNITY)
Admission: EM | Admit: 2011-11-22 | Discharge: 2011-11-24 | Disposition: A | Discharge status: Home / Self Care | Payer: Medicare Other | Attending: Internal Medicine | Admitting: Internal Medicine

## 2011-11-22 ENCOUNTER — Other Ambulatory Visit: Payer: Self-pay

## 2011-11-22 ENCOUNTER — Encounter (HOSPITAL_COMMUNITY): Payer: Self-pay | Admitting: Nurse Practitioner

## 2011-11-22 ENCOUNTER — Emergency Department (HOSPITAL_COMMUNITY): Payer: Medicare Other

## 2011-11-22 DIAGNOSIS — N189 Chronic kidney disease, unspecified: Secondary | ICD-10-CM

## 2011-11-22 DIAGNOSIS — R5381 Other malaise: Secondary | ICD-10-CM

## 2011-11-22 DIAGNOSIS — G2581 Restless legs syndrome: Secondary | ICD-10-CM

## 2011-11-22 DIAGNOSIS — N200 Calculus of kidney: Secondary | ICD-10-CM

## 2011-11-22 DIAGNOSIS — I5021 Acute systolic (congestive) heart failure: Secondary | ICD-10-CM

## 2011-11-22 DIAGNOSIS — E785 Hyperlipidemia, unspecified: Secondary | ICD-10-CM

## 2011-11-22 DIAGNOSIS — M545 Low back pain, unspecified: Secondary | ICD-10-CM

## 2011-11-22 DIAGNOSIS — R634 Abnormal weight loss: Secondary | ICD-10-CM

## 2011-11-22 DIAGNOSIS — N390 Urinary tract infection, site not specified: Secondary | ICD-10-CM

## 2011-11-22 DIAGNOSIS — R5383 Other fatigue: Secondary | ICD-10-CM

## 2011-11-22 DIAGNOSIS — K219 Gastro-esophageal reflux disease without esophagitis: Secondary | ICD-10-CM

## 2011-11-22 DIAGNOSIS — H8110 Benign paroxysmal vertigo, unspecified ear: Secondary | ICD-10-CM

## 2011-11-22 DIAGNOSIS — I5022 Chronic systolic (congestive) heart failure: Secondary | ICD-10-CM

## 2011-11-22 DIAGNOSIS — I10 Essential (primary) hypertension: Secondary | ICD-10-CM

## 2011-11-22 DIAGNOSIS — Z95810 Presence of automatic (implantable) cardiac defibrillator: Secondary | ICD-10-CM

## 2011-11-22 DIAGNOSIS — I5023 Acute on chronic systolic (congestive) heart failure: Principal | ICD-10-CM | POA: Insufficient documentation

## 2011-11-22 DIAGNOSIS — R109 Unspecified abdominal pain: Secondary | ICD-10-CM

## 2011-11-22 DIAGNOSIS — M949 Disorder of cartilage, unspecified: Secondary | ICD-10-CM

## 2011-11-22 DIAGNOSIS — F17200 Nicotine dependence, unspecified, uncomplicated: Secondary | ICD-10-CM

## 2011-11-22 DIAGNOSIS — I2510 Atherosclerotic heart disease of native coronary artery without angina pectoris: Secondary | ICD-10-CM

## 2011-11-22 DIAGNOSIS — G4700 Insomnia, unspecified: Secondary | ICD-10-CM

## 2011-11-22 DIAGNOSIS — I428 Other cardiomyopathies: Secondary | ICD-10-CM

## 2011-11-22 DIAGNOSIS — D649 Anemia, unspecified: Secondary | ICD-10-CM | POA: Insufficient documentation

## 2011-11-22 DIAGNOSIS — D6489 Other specified anemias: Secondary | ICD-10-CM

## 2011-11-22 DIAGNOSIS — L989 Disorder of the skin and subcutaneous tissue, unspecified: Secondary | ICD-10-CM

## 2011-11-22 DIAGNOSIS — R0602 Shortness of breath: Secondary | ICD-10-CM

## 2011-11-22 DIAGNOSIS — F329 Major depressive disorder, single episode, unspecified: Secondary | ICD-10-CM

## 2011-11-22 DIAGNOSIS — I2589 Other forms of chronic ischemic heart disease: Secondary | ICD-10-CM

## 2011-11-22 DIAGNOSIS — I509 Heart failure, unspecified: Secondary | ICD-10-CM

## 2011-11-22 DIAGNOSIS — R0600 Dyspnea, unspecified: Secondary | ICD-10-CM

## 2011-11-22 DIAGNOSIS — I252 Old myocardial infarction: Secondary | ICD-10-CM | POA: Insufficient documentation

## 2011-11-22 DIAGNOSIS — N951 Menopausal and female climacteric states: Secondary | ICD-10-CM

## 2011-11-22 DIAGNOSIS — I2109 ST elevation (STEMI) myocardial infarction involving other coronary artery of anterior wall: Secondary | ICD-10-CM

## 2011-11-22 DIAGNOSIS — J069 Acute upper respiratory infection, unspecified: Secondary | ICD-10-CM

## 2011-11-22 DIAGNOSIS — F3289 Other specified depressive episodes: Secondary | ICD-10-CM

## 2011-11-22 DIAGNOSIS — L219 Seborrheic dermatitis, unspecified: Secondary | ICD-10-CM

## 2011-11-22 DIAGNOSIS — I739 Peripheral vascular disease, unspecified: Secondary | ICD-10-CM

## 2011-11-22 DIAGNOSIS — M899 Disorder of bone, unspecified: Secondary | ICD-10-CM

## 2011-11-22 DIAGNOSIS — R51 Headache: Secondary | ICD-10-CM

## 2011-11-22 DIAGNOSIS — R42 Dizziness and giddiness: Secondary | ICD-10-CM

## 2011-11-22 HISTORY — DX: Heart failure, unspecified: I50.9

## 2011-11-22 HISTORY — DX: Anemia, unspecified: D64.9

## 2011-11-22 LAB — URINE MICROSCOPIC-ADD ON

## 2011-11-22 LAB — DIFFERENTIAL
Eosinophils Absolute: 0.4 10*3/uL (ref 0.0–0.7)
Eosinophils Relative: 3 % (ref 0–5)
Lymphs Abs: 2.2 10*3/uL (ref 0.7–4.0)

## 2011-11-22 LAB — COMPREHENSIVE METABOLIC PANEL
ALT: 14 U/L (ref 0–35)
Calcium: 9.9 mg/dL (ref 8.4–10.5)
GFR calc Af Amer: 43 mL/min — ABNORMAL LOW (ref >90)
Glucose, Bld: 89 mg/dL (ref 70–99)
Sodium: 138 mEq/L (ref 135–145)
Total Protein: 7.5 g/dL (ref 6.0–8.3)

## 2011-11-22 LAB — CBC
MCH: 31.4 pg (ref 26.0–34.0)
MCHC: 31.7 g/dL (ref 30.0–36.0)
MCV: 99 fL (ref 78.0–100.0)
Platelets: 160 10*3/uL (ref 150–400)
RBC: 4.05 MIL/uL (ref 3.87–5.11)
RDW: 14.4 % (ref 11.5–15.5)

## 2011-11-22 LAB — PHOSPHORUS: Phosphorus: 2.6 mg/dL (ref 2.3–4.6)

## 2011-11-22 LAB — TROPONIN I: Troponin I: <0.3 ng/mL (ref <0.30)

## 2011-11-22 LAB — URINALYSIS, ROUTINE W REFLEX MICROSCOPIC
Hgb urine dipstick: NEGATIVE
Nitrite: NEGATIVE
Specific Gravity, Urine: 1.011 (ref 1.005–1.030)
Urobilinogen, UA: 0.2 mg/dL (ref 0.0–1.0)

## 2011-11-22 LAB — CARDIAC PANEL(CRET KIN+CKTOT+MB+TROPI)
CK, MB: 1.9 ng/mL (ref 0.3–4.0)
Troponin I: <0.3 ng/mL (ref <0.30)

## 2011-11-22 LAB — PRO B NATRIURETIC PEPTIDE: Pro B Natriuretic peptide (BNP): 3645 pg/mL — ABNORMAL HIGH (ref 0–125)

## 2011-11-22 MED ORDER — SODIUM CHLORIDE 0.9 % IJ SOLN
3.0000 mL | Freq: Two times a day (BID) | INTRAMUSCULAR | Status: DC
  Administered 2011-11-23 – 2011-11-24 (×2): 3 mL via INTRAVENOUS

## 2011-11-22 MED ORDER — FERROUS SULFATE 325 (65 FE) MG PO TABS
325.0000 mg | ORAL_TABLET | Freq: Every day | ORAL | Status: DC
  Administered 2011-11-23 – 2011-11-24 (×2): 325 mg via ORAL
  Filled 2011-11-22 (×3): qty 1

## 2011-11-22 MED ORDER — ZOLPIDEM TARTRATE 5 MG PO TABS
5.0000 mg | ORAL_TABLET | Freq: Once | ORAL | Status: AC
  Administered 2011-11-22: 5 mg via ORAL
  Filled 2011-11-22: qty 1

## 2011-11-22 MED ORDER — CITALOPRAM HYDROBROMIDE 40 MG PO TABS
40.0000 mg | ORAL_TABLET | Freq: Every day | ORAL | Status: DC
  Filled 2011-11-22 (×2): qty 1

## 2011-11-22 MED ORDER — FUROSEMIDE 40 MG PO TABS
40.0000 mg | ORAL_TABLET | Freq: Every day | ORAL | Status: DC
  Administered 2011-11-22 – 2011-11-24 (×3): 40 mg via ORAL
  Filled 2011-11-22 (×3): qty 1

## 2011-11-22 MED ORDER — ENOXAPARIN SODIUM 30 MG/0.3ML ~~LOC~~ SOLN
30.0000 mg | SUBCUTANEOUS | Status: DC
  Administered 2011-11-22 – 2011-11-23 (×2): 30 mg via SUBCUTANEOUS
  Filled 2011-11-22 (×3): qty 0.3

## 2011-11-22 MED ORDER — CLOPIDOGREL BISULFATE 75 MG PO TABS
75.0000 mg | ORAL_TABLET | Freq: Every day | ORAL | Status: DC
  Administered 2011-11-22 – 2011-11-24 (×3): 75 mg via ORAL
  Filled 2011-11-22 (×3): qty 1

## 2011-11-22 MED ORDER — EZETIMIBE 10 MG PO TABS
10.0000 mg | ORAL_TABLET | Freq: Every day | ORAL | Status: DC
  Administered 2011-11-22 – 2011-11-24 (×3): 10 mg via ORAL
  Filled 2011-11-22 (×3): qty 1

## 2011-11-22 MED ORDER — POTASSIUM CHLORIDE CRYS ER 20 MEQ PO TBCR
20.0000 meq | EXTENDED_RELEASE_TABLET | Freq: Every day | ORAL | Status: DC
  Administered 2011-11-22 – 2011-11-24 (×3): 20 meq via ORAL
  Filled 2011-11-22 (×4): qty 1

## 2011-11-22 MED ORDER — VITAMIN D 1000 UNITS PO CAPS
1000.0000 [IU] | ORAL_CAPSULE | Freq: Every day | ORAL | Status: DC
  Filled 2011-11-22 (×4): qty 1

## 2011-11-22 MED ORDER — CLONAZEPAM 0.5 MG PO TABS
0.2500 mg | ORAL_TABLET | Freq: Two times a day (BID) | ORAL | Status: DC | PRN
  Administered 2011-11-22 – 2011-11-23 (×3): 0.25 mg via ORAL
  Filled 2011-11-22 (×4): qty 1

## 2011-11-22 MED ORDER — HYDROCODONE-ACETAMINOPHEN 5-325 MG PO TABS
1.0000 | ORAL_TABLET | Freq: Four times a day (QID) | ORAL | Status: DC | PRN
  Administered 2011-11-22 – 2011-11-23 (×2): 1 via ORAL
  Filled 2011-11-22 (×2): qty 1

## 2011-11-22 MED ORDER — SODIUM CHLORIDE 0.9 % IJ SOLN: 3.0000 mL | INTRAMUSCULAR | Status: DC | PRN

## 2011-11-22 MED ORDER — METOPROLOL SUCCINATE ER 50 MG PO TB24
50.0000 mg | ORAL_TABLET | Freq: Every day | ORAL | Status: DC
  Administered 2011-11-22 – 2011-11-24 (×3): 50 mg via ORAL
  Filled 2011-11-22 (×3): qty 1

## 2011-11-22 MED ORDER — SODIUM CHLORIDE 0.9 % IJ SOLN
3.0000 mL | Freq: Two times a day (BID) | INTRAMUSCULAR | Status: DC
  Administered 2011-11-22 – 2011-11-23 (×2): 3 mL via INTRAVENOUS

## 2011-11-22 MED ORDER — ASPIRIN 81 MG PO CHEW
81.0000 mg | CHEWABLE_TABLET | Freq: Every day | ORAL | Status: DC
  Administered 2011-11-23 – 2011-11-24 (×2): 81 mg via ORAL
  Filled 2011-11-22 (×4): qty 1

## 2011-11-22 MED ORDER — VITAMIN D3 25 MCG (1000 UNIT) PO TABS
1000.0000 [IU] | ORAL_TABLET | Freq: Every day | ORAL | Status: DC
  Administered 2011-11-23 – 2011-11-24 (×2): 1000 [IU] via ORAL
  Filled 2011-11-22 (×2): qty 1

## 2011-11-22 MED ORDER — PANTOPRAZOLE SODIUM 40 MG PO TBEC
40.0000 mg | DELAYED_RELEASE_TABLET | Freq: Every day | ORAL | Status: DC
  Administered 2011-11-22 – 2011-11-24 (×3): 40 mg via ORAL
  Filled 2011-11-22 (×2): qty 1

## 2011-11-22 MED ORDER — DEXTROSE 5 % IV SOLN
1.0000 g | INTRAVENOUS | Status: DC
  Administered 2011-11-22 – 2011-11-23 (×2): 1 g via INTRAVENOUS
  Filled 2011-11-22 (×3): qty 10

## 2011-11-22 MED ORDER — SODIUM CHLORIDE 0.9 % IV SOLN: 250.0000 mL | INTRAVENOUS | Status: DC | PRN

## 2011-11-22 MED ORDER — ALLOPURINOL 100 MG PO TABS
100.0000 mg | ORAL_TABLET | Freq: Every day | ORAL | Status: DC
  Administered 2011-11-22 – 2011-11-24 (×3): 100 mg via ORAL
  Filled 2011-11-22 (×3): qty 1

## 2011-11-22 MED ORDER — ATORVASTATIN CALCIUM 40 MG PO TABS
40.0000 mg | ORAL_TABLET | Freq: Every day | ORAL | Status: DC
  Administered 2011-11-23: 40 mg via ORAL
  Filled 2011-11-22 (×3): qty 1

## 2011-11-22 MED ORDER — RAMIPRIL 2.5 MG PO CAPS
2.5000 mg | ORAL_CAPSULE | Freq: Every day | ORAL | Status: DC
  Administered 2011-11-22 – 2011-11-24 (×3): 2.5 mg via ORAL
  Filled 2011-11-22 (×3): qty 1

## 2011-11-22 NOTE — ED Notes (Signed)
Reports has had bronchitis, cough since Christmas but got worse one week ago. C/o non prod cough, congestion, malaise, generalized weakness, body aches, poor appetite, chills x 1 week. Also c/o intermittent episodes of dizziness with position changes. Denies n/v/d, fever. Was instructed today by PCP to go to ED

## 2011-11-22 NOTE — ED Notes (Signed)
Informed patient and/or family of status. No voiced complaints presently. NAD. Respirations even & unlabored, no distress.  Awaiting test results.

## 2011-11-22 NOTE — ED Notes (Signed)
Patient transported to X-ray 

## 2011-11-22 NOTE — ED Notes (Signed)
Patient transported to CT 

## 2011-11-22 NOTE — H&P (Signed)
Patient's PCP: Johny Blamer, MD, MD  Chief Complaint: dizziness/lightheaded  History of Present Illness: [name] is a 66 y.o. caucaian female who comes to the ER c/o worsening dizziness.  Has been going on for 3 years but worsened after she cleaned out her bathroom with several different agents.  She has a  history of CAD status post MI, CHF and anemia.   She states that she has chronic dizziness, but in the past 2-3 days is gotten worse from baseline. She had a fall at home 2 days ago for her dizziness. She states that she was lightheaded and dizzy but denies any syncope. She states her dizziness is worse when going from seated to standing. She does endorse orthopnea but denies any worsening leg swelling. No tilt test was done in ER.  Complains of CP.  States she has had recent UTIs treated with abxs.  States she can not live without her klonopin/ambien/and Vicodin.   Generalized weakness and fatigue for last few weeks   Allergies: Morphine Past Medical History  Diagnosis Date  . PVD (peripheral vascular disease)   . Back pain   . Arthritis   . Heart disease   . CHF (congestive heart failure)   . Myocardial infarction   . Anemia    Past Surgical History  Procedure Date  . Visual merchandiser   . Foot surgery   . Pr vein bypass graft,aorto-fem-pop     fem fem  04/07/09  . Abdominal hysterectomy   . Cardiac defibrillator placement    Family History  Problem Relation Age of Onset  . Peripheral vascular disease Sister   . Heart disease Brother    History   Social History  . Marital Status: Legally Separated    Spouse Name: N/A    Number of Children: N/A  . Years of Education: N/A   Occupational History  . Not on file.   Social History Main Topics  . Smoking status: Current Everyday Smoker -- 1.0 packs/day for 55 years    Types: Cigarettes  . Smokeless tobacco: Never Used  . Alcohol Use: No  . Drug Use: No  . Sexually Active: Not on file   Other Topics Concern  .  Not on file   Social History Narrative  . No narrative on file   Review of Systems: All systems reviewed with the patient and positive as per history of present illness, otherwise all other systems are negative.   Physical Exam: Blood pressure 121/44, pulse 62, temperature 97.4 F (36.3 C), temperature source Oral, resp. rate 20, height 5\' 4"  (1.626 m), weight 55.339 kg (122 lb), SpO2 96.00%. General: Awake, Oriented x3, No acute distress. HEENT: EOMI, Moist mucous membranes Neck: Supple, no JVD CV: S1 and S2, RRR- AICD  Lungs: Clear to ascultation bilaterally, no wheezing Abdomen: Soft, Nontender, Nondistended, +bowel sounds. Ext: Good pulses. Trace edema. No clubbing or cyanosis noted. Neuro: Cranial Nerves II-XII grossly intact. Has 5/5 motor strength in upper and lower extremities.    Lab results:  [facility] 11/22/11 1541  NA 138  K 3.7  CL 100  CO2 27  GLUCOSE 89  BUN 20  CREATININE 1.43*  CALCIUM 9.9  MG --  PHOS --    Basename 11/22/11 1541  AST 26  ALT 14  ALKPHOS 134*  BILITOT 0.2*  PROT 7.5  ALBUMIN 3.6   No results found for this basename: LIPASE:2,AMYLASE:2 in the last 72 hours  Basename 11/22/11 1541  WBC 13.0*  NEUTROABS  9.3*  HGB 12.7  HCT 40.1  MCV 99.0  PLT 160    Basename 11/22/11 1543  CKTOTAL --  CKMB --  CKMBINDEX --  TROPONINI <0.30   No components found with this basename: POCBNP:3 No results found for this basename: DDIMER in the last 72 hours No results found for this basename: HGBA1C:2 in the last 72 hours No results found for this basename: CHOL:2,HDL:2,LDLCALC:2,TRIG:2,CHOLHDL:2,LDLDIRECT:2 in the last 72 hours No results found for this basename: TSH,T4TOTAL,FREET3,T3FREE,THYROIDAB in the last 72 hours No results found for this basename: VITAMINB12:2,FOLATE:2,FERRITIN:2,TIBC:2,IRON:2,RETICCTPCT:2 in the last 72 hours Imaging results:  Dg Chest 2 View  11/22/2011  *RADIOLOGY REPORT*  Clinical Data: Dizziness  CHEST - 2  VIEW  Comparison: 04/20/2010  Findings: Left subclavian AICD device is stable.  Mild cardiomegaly.  Normal vascularity.  Clear lungs.  No pneumothorax or pleural fluid.  IMPRESSION: Cardiomegaly without edema.  Original Report Authenticated By: Donavan Burnet, M.D.   Ct Head Wo Contrast  11/22/2011  *RADIOLOGY REPORT*  Clinical Data: Dizziness.  CT HEAD WITHOUT CONTRAST  Technique:  Contiguous axial images were obtained from the base of the skull through the vertex without contrast.  Comparison: CT head 07/09/2007.  Findings: Mild cerebral and cerebellar atrophy, slightly increased compared to prior study from 2008.  Multifocal areas of ill-defined decreased attenuation are seen scattered throughout the deep and periventricular white matter of the cerebral hemispheres bilaterally, slightly increased compared to prior study from 2008, but again most consistent with chronic microvascular ischemic changes.  There is a well-defined focus of decreased attenuation in the inferior aspect of the right putamen, and immediately posterior to the left caudate head, both of which are unchanged, and consistent with old lacunar infarctions.  No definite acute intracranial abnormality.  Specifically, no findings strongly suggestive of acute/subacute ischemia, mass, mass effect, hydrocephalus or abnormal intra or extra-axial fluid collections. No displaced skull fractures are identified.  Visualized paranasal sinuses and mastoids are well pneumatized.  IMPRESSION: 1.  No acute intracranial abnormalities. 2.  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  Original Report Authenticated By: Florencia Reasons, M.D.    Assessment & Plan by Problem: Principal Problem:  *Dizziness- unknown etiology, tilt test as it sounds orthostatic, CE,  echo, ?UTI- get culture, check TSH, will ask PT to see Watch BP, consult cardiology if needs arises, may be overmedicated (pateint on klonopin/vicodin and ambien with decreased renal function- will have to start some klonopin as patient may withdraw without it but this will need to be watched as a possible confounding factor if no other source is found)   SOB (shortness of breath)- appears to be at baseline always has some SOB   CKD (chronic kidney disease)- baseline  UTI (recent)- check culture here to be sure full treated  CAD with AICD- stable  tobacoo abuse- encourage cessation    Time spent on admission, talking to the patient, and coordinating care was: 45 mins.  Arijana Narayan, DO 11/22/2011, 7:11 PM

## 2011-11-22 NOTE — ED Notes (Signed)
States dizziness, sob, dry cough since Tuesday. C/o body aches. States she fell twice this week due to feeling dizzy but did not get injured. A&Ox4

## 2011-11-22 NOTE — ED Notes (Signed)
No voiced complaints presently. NAD. Resting, eyes closed, easily aroused.  Awaiting test results. Informed patient and/or family of status. Denies any pain or dizziness presently.

## 2011-11-22 NOTE — ED Notes (Signed)
Care transferred and report given to Monica, RN. 

## 2011-11-22 NOTE — ED Notes (Signed)
Nurse in a room with a patient will call back in 5 mins

## 2011-11-23 DIAGNOSIS — I059 Rheumatic mitral valve disease, unspecified: Secondary | ICD-10-CM

## 2011-11-23 LAB — BASIC METABOLIC PANEL
BUN: 16 mg/dL (ref 6–23)
Chloride: 102 mEq/L (ref 96–112)
GFR calc non Af Amer: 48 mL/min — ABNORMAL LOW (ref >90)
Glucose, Bld: 93 mg/dL (ref 70–99)
Potassium: 3.7 mEq/L (ref 3.5–5.1)

## 2011-11-23 LAB — URINE CULTURE

## 2011-11-23 LAB — CBC
HCT: 35.1 % — ABNORMAL LOW (ref 36.0–46.0)
Hemoglobin: 11.7 g/dL — ABNORMAL LOW (ref 12.0–15.0)
MCH: 32 pg (ref 26.0–34.0)
MCHC: 33.3 g/dL (ref 30.0–36.0)

## 2011-11-23 LAB — CARDIAC PANEL(CRET KIN+CKTOT+MB+TROPI): CK, MB: 1.9 ng/mL (ref 0.3–4.0)

## 2011-11-23 MED ORDER — FUROSEMIDE 10 MG/ML IJ SOLN
40.0000 mg | Freq: Once | INTRAMUSCULAR | Status: AC
  Administered 2011-11-23: 40 mg via INTRAVENOUS
  Filled 2011-11-23: qty 4

## 2011-11-23 NOTE — Evaluation (Signed)
Physical Therapy Evaluation Patient Details Name: ELWYN LOWDEN MRN: [id number] DOB: 13-Jun-1946 Today's Date: 11/23/2011  Problem List:  Patient Active Problem List  Diagnoses  . HYPERLIPIDEMIA  . ANEMIA NEC  . TOBACCO ABUSE  . INSOMNIA, PERSISTENT  . Depressive Disorder, not Elsewhere Classified  . RESTLESS LEG SYNDROME  . HYPERTENSION  . AMI, ANTEROLATERAL  . CORONARY ARTERY DISEASE  . OTHER SPEC FORMS CHRONIC ISCHEMIC HEART DISEASE  . CHF  . CHRONIC SYSTOLIC HEART FAILURE  . LEFT VENTRICULAR FUNCTION, DECREASED  . PVD  . URI  . GERD  . RENAL CALCULUS, LEFT  . UTI  . POSTMENOPAUSAL STATUS  . SEBORRHEIC DERMATITIS  . SKIN LESION  . LOW BACK PAIN  . OSTEOPENIA  . DIZZINESS  . LETHARGY  . WEIGHT LOSS  . HEADACHE, OCCIPITAL  . ABDOMINAL PAIN  . AUTOMATIC IMPLANTABLE CARDIAC DEFIBRILLATOR SITU  . Dizziness  . SOB (shortness of breath)  . CKD (chronic kidney disease)    Past Medical History:  Past Medical History  Diagnosis Date  . PVD (peripheral vascular disease)   . Back pain   . Arthritis   . Heart disease   . CHF (congestive heart failure)   . Myocardial infarction   . Anemia    Past Surgical History:  Past Surgical History  Procedure Date  . Visual merchandiser   . Foot surgery   . Pr vein bypass graft,aorto-fem-pop     fem fem  04/07/09  . Abdominal hysterectomy   . Cardiac defibrillator placement     PT Assessment/Plan/Recommendation PT Assessment Clinical Impression Statement: Patient admitted with dizziness and CP.  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  Patient needs continued therapy and is willing to go to NH for vestibular Rehab.   PT Recommendation/Assessment: Patient will need skilled PT in the acute care venue PT Problem List: Decreased activity tolerance;Decreased balance;Decreased mobility;Decreased safety awareness Barriers to Discharge: Decreased caregiver support PT  Therapy Diagnosis : Difficulty walking;Generalized weakness PT Plan PT Frequency: Min 3X/week PT Treatment/Interventions: DME instruction;Gait training;Functional mobility training;Therapeutic activities;Therapeutic exercise;Balance training;Patient/family education PT Recommendation Follow Up Recommendations: Skilled nursing facility;Supervision/Assistance - 24 hour Equipment Recommended: Defer to next venue PT Goals  Acute Rehab PT Goals PT Goal Formulation: With patient Time For Goal Achievement: 2 weeks Pt will go Sit to Stand: Independently PT Goal: Sit to Stand - Progress: Goal set today Pt will go Stand to Sit: Independently PT Goal: Stand to Sit - Progress: Goal set today Pt will Ambulate: >150 feet;with least restrictive assistive device;with modified independence PT Goal: Ambulate - Progress: Goal set today Pt will Go Up / Down Stairs: 3-5 stairs;with supervision;with least restrictive assistive device PT Goal: Up/Down Stairs - Progress: Goal set today Pt will Perform Home Exercise Program: Independently PT Goal: Perform Home Exercise Program - Progress: Goal set today Additional Goals Additional Goal #1: Patient to have negative hallpike dix test bil. PT Goal: Additional Goal #1 - Progress: Goal set today Additional Goal #2: Patient to perform x 1 exercises with dizziness <3/10.   PT Goal: Additional Goal #2 - Progress: Goal set today  PT Evaluation Precautions/Restrictions  Precautions Precautions: Fall Precaution Comments: Falls a couple x a week Required Braces or Orthoses: No Restrictions Weight Bearing Restrictions: No Prior Functioning  Home Living Lives With: Spouse Receives Help From: Family (Alone portions of day) Type of Home: House Home Layout: One level Home Access: Stairs to enter Entrance Stairs-Rails: Can  reach both Entrance Stairs-Number of Steps: 4 Bathroom Shower/Tub: Engineer, manufacturing systems: Handicapped height Home Adaptive  Equipment: Straight cane;Wheelchair - powered Additional Comments: Used cane when she felt she needed; can't use the power wheelchair inside the house Prior Function Level of Independence: Needs assistance with ADLs;Requires assistive device for independence;Independent with gait;Independent with transfers Bath: Minimal (help to get in) Driving: No Vocation: Retired Producer, television/film/video: Awake/alert Overall Cognitive Status: Appears within functional limits for tasks assessed Sensation/Coordination Sensation Light Touch: Appears Intact Stereognosis: Not tested Hot/Cold: Not tested Proprioception: Not tested Coordination Gross Motor Movements are Fluid and Coordinated: Yes Fine Motor Movements are Fluid and Coordinated: Yes Extremity Assessment RUE Assessment RUE Assessment: Within Functional Limits LUE Assessment LUE Assessment: Within Functional Limits RLE Assessment RLE Assessment: Within Functional Limits LLE Assessment LLE Assessment: Within Functional Limits Mobility (including Balance) Bed Mobility Bed Mobility: Yes Rolling Left: 7: Independent Left Sidelying to Sit: 7: Independent Transfers Transfers: Yes Sit to Stand: 4: Min assist;With upper extremity assist;From bed Sit to Stand Details (indicate cue type and reason): unsteady from bed Stand to Sit: 4: Min assist;With upper extremity assist;To bed;To chair/3-in-1 Stand to Sit Details: pt unsteady on feet overall Ambulation/Gait Ambulation/Gait: No Stairs: No Wheelchair Mobility Wheelchair Mobility: No  Posture/Postural Control Posture/Postural Control: No significant limitations Balance Balance Assessed: Yes Static Sitting Balance Static Sitting - Balance Support: No upper extremity supported;Feet supported Static Sitting - Level of Assistance: 7: Independent Static Sitting - Comment/# of Minutes: Sat EOB x 25 minutes while PT performed vestibular assessment as this PT questioned  vestibular issues from questioning.  Patient tested + for a right hypofunction via head thrust test.  Noted some rotary nystagmus as well while performing gaze holding nystagmus.  Therefore, checked Modified Hallpike test Left and Right with Right being more symptomatic than left.  Performed canalith repositioning to right without much symptoms and noted left rotarty nystagmus therefore treated the left side as well.  Patient nauseous after treatment performed.  Left in chair.     Exercise    End of Session PT - End of Session Equipment Utilized During Treatment: Gait belt Activity Tolerance: Patient tolerated treatment well Patient left: in chair;with call bell in reach;with family/visitor present Nurse Communication: Mobility status for transfers General Behavior During Session: [facility] for tasks performed Cognition: [facility] for tasks performed  INGOLD,Avya Flavell 11/23/2011, 2:10 PM  [facility] [phone] (pager)

## 2011-11-23 NOTE — Progress Notes (Signed)
Utilization review complete 

## 2011-11-23 NOTE — Plan of Care (Signed)
Problem: Consults Goal: Heart Failure Patient Education (See Patient Education module for education specifics.)  Outcome: Progressing Pt says she loves salt Family laughed when we talked about sodium control Goal: Tobacco Cessation referral if indicated Outcome: Completed/Met Date Met:  11/23/11 Order placed for Tobacco cessation

## 2011-11-23 NOTE — Progress Notes (Signed)
  Echocardiogram 2D Echocardiogram has been performed.  Ander Wamser, Real Cons 11/23/2011, 4:49 PM

## 2011-11-23 NOTE — Progress Notes (Signed)
   CARE MANAGEMENT NOTE 11/23/2011  Patient:  [name], [name]   Account Number:  [id number]  Date Initiated:  11/23/2011  Documentation initiated by:  Donn Pierini  Subjective/Objective Assessment:   Pt admitted with dizziness     Action/Plan:   PTA pt lived at home alone, was independent with ADLs   Anticipated DC Date:  11/24/2011   Anticipated DC Plan:  HOME/SELF CARE      DC Planning Services  CM consult      Choice offered to / List presented to:             Status of service:  In process, will continue to follow Medicare Important Message given?   (If response is "NO", the following Medicare IM given date fields will be blank) Date Medicare IM given:   Date Additional Medicare IM given:    Discharge Disposition:    Per UR Regulation:    Comments:  PCP-  [facility]  11/23/11- 1420 -Donn Pierini RN, BSN [phone] Spoke with pt at bedside- per conversation pt states she lives at home alone, but has daughter [name] who lives 4 houses down from her. Pt states she uses a cane, and gets her medications from [facility]- she reports she has medication benefits with Medicare and Medicaid. Anticipate d/c home, CM will follow for potential d/c needs.

## 2011-11-23 NOTE — Progress Notes (Signed)
Subjective: Patient c/o SOB and dizziness still Been getting up and going to the bathroom ok   Objective: Vital signs in last 24 hours: Filed Vitals:   11/23/11 0732 11/23/11 0733 11/23/11 1011 11/23/11 1012  BP: 94/54 108/58 119/59   Pulse: 68 67  70  Temp:      TempSrc:      Resp: 18 18    Height:      Weight:      SpO2:       Weight change:   Intake/Output Summary (Last 24 hours) at 11/23/11 1247 Last data filed at 11/23/11 1240  Gross per 24 hour  Intake   1083 ml  Output   1201 ml  Net   -118 ml    Physical Exam: General: Awake, Oriented, No acute distress. HEENT: EOMI. Neck: Supple CV: S1 and S2, RRR Lungs: few crackles B/L Abdomen: Soft, Nontender, Nondistended, +bowel sounds. Ext: Good pulses. Trace edema.   Lab Results:  Basename 11/23/11 0540 11/22/11 2112 11/22/11 1541  NA 138 -- 138  K 3.7 -- 3.7  CL 102 -- 100  CO2 26 -- 27  GLUCOSE 93 -- 89  BUN 16 -- 20  CREATININE 1.16* -- 1.43*  CALCIUM 9.3 -- 9.9  MG -- 1.8 --  PHOS -- 2.6 --    Basename 11/22/11 1541  AST 26  ALT 14  ALKPHOS 134*  BILITOT 0.2*  PROT 7.5  ALBUMIN 3.6   No results found for this basename: LIPASE:2,AMYLASE:2 in the last 72 hours  Basename 11/23/11 0540 11/22/11 1541  WBC 9.9 13.0*  NEUTROABS -- 9.3*  HGB 11.7* 12.7  HCT 35.1* 40.1  MCV 95.9 99.0  PLT 155 160    Basename 11/23/11 0540 11/22/11 2111 11/22/11 1543  CKTOTAL 39 46 --  CKMB 1.9 1.9 --  CKMBINDEX -- -- --  TROPONINI <0.30 <0.30 <0.30   No components found with this basename: POCBNP:3 No results found for this basename: DDIMER:2 in the last 72 hours No results found for this basename: HGBA1C:2 in the last 72 hours No results found for this basename: CHOL:2,HDL:2,LDLCALC:2,TRIG:2,CHOLHDL:2,LDLDIRECT:2 in the last 72 hours  Basename 11/23/11 0540  TSH 2.483  T4TOTAL --  T3FREE --  THYROIDAB --   No results found for this basename: VITAMINB12:2,FOLATE:2,FERRITIN:2,TIBC:2,IRON:2,RETICCTPCT:2  in the last 72 hours  Micro Results: No results found for this or any previous visit (from the past 240 hour(s)).  Studies/Results: Dg Chest 2 View  11/22/2011  *RADIOLOGY REPORT*  Clinical Data: Dizziness  CHEST - 2 VIEW  Comparison: 04/20/2010  Findings: Left subclavian AICD device is stable.  Mild cardiomegaly.  Normal vascularity.  Clear lungs.  No pneumothorax or pleural fluid.  IMPRESSION: Cardiomegaly without edema.  Original Report Authenticated By: Donavan Burnet, M.D.   Ct Head Wo Contrast  11/22/2011  *RADIOLOGY REPORT*  Clinical Data: Dizziness.  CT HEAD WITHOUT CONTRAST  Technique:  Contiguous axial images were obtained from the base of the skull through the vertex without contrast.  Comparison: CT head 07/09/2007.  Findings: Mild cerebral and cerebellar atrophy, slightly increased compared to prior study from 2008.  Multifocal areas of ill-defined decreased attenuation are seen scattered throughout the deep and periventricular white matter of the cerebral hemispheres bilaterally, slightly increased compared to prior study from 2008, but again most consistent with chronic microvascular ischemic changes.  There is a well-defined focus of decreased attenuation in the inferior aspect of the right putamen, and immediately posterior to the left caudate head, both  of which are unchanged, and consistent with old lacunar infarctions.  No definite acute intracranial abnormality.  Specifically, no findings strongly suggestive of acute/subacute ischemia, mass, mass effect, hydrocephalus or abnormal intra or extra-axial fluid collections. No displaced skull fractures are identified.  Visualized paranasal sinuses and mastoids are well pneumatized.  IMPRESSION: 1.  No acute intracranial abnormalities. 2.  The study is remarkable for cerebral and cerebellar atrophy, chronic microvascular ischemic changes in the deep and periventricular white matter of the cerebral hemispheres bilaterally and old basal ganglia  lacunar infarctions, as above.  Original Report Authenticated By: Florencia Reasons, M.D.    Medications: I have reviewed the patient's current medications. Scheduled Meds:   . allopurinol  100 mg Oral Daily  . aspirin  81 mg Oral Daily  . atorvastatin  40 mg Oral q1800  . cefTRIAXone (ROCEPHIN)  IV  1 g Intravenous Q24H  . cholecalciferol  1,000 Units Oral Daily  . clopidogrel  75 mg Oral Daily  . enoxaparin  30 mg Subcutaneous Q24H  . ezetimibe  10 mg Oral Daily  . ferrous sulfate  325 mg Oral Q breakfast  . furosemide  40 mg Intravenous Once  . furosemide  40 mg Oral Daily  . metoprolol succinate  50 mg Oral Daily  . pantoprazole  40 mg Oral Daily  . potassium chloride SA  20 mEq Oral Daily  . ramipril  2.5 mg Oral Daily  . sodium chloride  3 mL Intravenous Q12H  . sodium chloride  3 mL Intravenous Q12H  . zolpidem  5 mg Oral Once  . DISCONTD: citalopram  40 mg Oral Daily  . DISCONTD: Vitamin D  1,000 Units Oral Daily   Continuous Infusions:  PRN Meds:.sodium chloride, clonazePAM, HYDROcodone-acetaminophen, sodium chloride  Assessment/Plan: Principal Problem:  *Dizziness- await PT eval, unknown etiology patient on multiple meds that could contribute but unwilling to stop taking: ambien, klonopin, and pain medications TSH ok   SOB (shortness of breath)- will give extra dose of lasix, await echo, may need chest x-ray in AM to better define   CKD (chronic kidney disease)- at baseline  Leukocytosis- resolved    LOS: 1 day  Kadeem Hyle, DO 11/23/2011, 12:47 PM

## 2011-11-23 NOTE — Progress Notes (Signed)
Pt admitted from [facility] ED with shortness of breath.  She is here for new heart failure workup.  She arrived to the room via stretcher, ambulatory with assistance, dyspnea on exertion present.  Oriented patient and family to room and floor procedures.  On assessment patient has crackles in lungs and abdominal distention present.  Educated patient on upcoming procedures and medication.  Educated patient on falls precautions and need for bed alarm to be activated.  Patient and family agree with plan of care so far.  Will continue to monitor. Royden Purl RN

## 2011-11-23 NOTE — Plan of Care (Signed)
Problem: Phase I Progression Outcomes Goal: EF % per last Echo/documented,Core Reminder form on chart Outcome: Progressing Echo ordered for this admission.

## 2011-11-23 NOTE — ED Provider Notes (Signed)
History     CSN: [id number]  Arrival date & time 11/22/11  1333   First MD Initiated Contact with Patient 11/22/11 1502      Chief Complaint  Patient presents with  . Dizziness    (Consider location/radiation/quality/duration/timing/severity/associated sxs/prior treatment) Patient is a 66 y.o. female presenting with shortness of breath. The history is provided by the patient.  Shortness of Breath  The current episode started 2 days ago. The problem occurs occasionally. The problem has been gradually worsening. The problem is mild. The symptoms are relieved by nothing. The symptoms are aggravated by activity. Associated symptoms include chest pain (Intermittent left-sided chest pain for the past 2 days.), orthopnea, cough and shortness of breath. Pertinent negatives include no fever. There were no sick contacts.    Past Medical History  Diagnosis Date  . PVD (peripheral vascular disease)   . Back pain   . Arthritis   . Heart disease   . CHF (congestive heart failure)   . Myocardial infarction   . Anemia     Past Surgical History  Procedure Date  . Visual merchandiser   . Foot surgery   . Pr vein bypass graft,aorto-fem-pop     fem fem  04/07/09  . Abdominal hysterectomy   . Cardiac defibrillator placement     Family History  Problem Relation Age of Onset  . Peripheral vascular disease Sister   . Heart disease Brother     History  Substance Use Topics  . Smoking status: Current Everyday Smoker -- 1.0 packs/day for 55 years    Types: Cigarettes  . Smokeless tobacco: Never Used  . Alcohol Use: No    OB History    Grav Para Term Preterm Abortions TAB SAB Ect Mult Living                  Review of Systems  Constitutional: Negative for fever.  Respiratory: Positive for cough and shortness of breath.   Cardiovascular: Positive for chest pain (Intermittent left-sided chest pain for the past 2 days.) and orthopnea.  Gastrointestinal: Negative for nausea, vomiting, diarrhea  and constipation.  All other systems reviewed and are negative.    Allergies  Morphine  Home Medications  No current outpatient prescriptions on file.  BP 128/61  Pulse 72  Temp(Src) 97.8 F (36.6 C) (Oral)  Resp 18  Ht 5\' 4"  (1.626 m)  Wt 122 lb 2.2 oz (55.4 kg)  BMI 20.96 kg/m2  SpO2 98%  Physical Exam  Nursing note and vitals reviewed. Constitutional: She is oriented to person, place, and time. She appears well-developed and well-nourished. No distress.  HENT:  Head: Normocephalic and atraumatic.  Mouth/Throat: Oropharynx is clear and moist.  Eyes: EOM are normal. Pupils are equal, round, and reactive to light.  Neck: Normal range of motion. Neck supple.  Cardiovascular: Normal rate, regular rhythm and normal heart sounds.  Exam reveals no friction rub.   No murmur heard. Pulmonary/Chest: Effort normal and breath sounds normal. No respiratory distress. She has no wheezes. She has no rales.  Abdominal: Soft. There is no tenderness. There is no rebound and no guarding.  Musculoskeletal: Normal range of motion. She exhibits no edema and no tenderness.  Lymphadenopathy:    She has no cervical adenopathy.  Neurological: She is alert and oriented to person, place, and time. She has normal strength. No cranial nerve deficit or sensory deficit. GCS eye subscore is 4. GCS verbal subscore is 5. GCS motor subscore is 6.  5/5 strength in all Shoney's. Normal finger to nose and finger-nose-finger testing.  Skin: Skin is warm and dry. No rash noted.  Psychiatric: She has a normal mood and affect. Her behavior is normal.    ED Course  Procedures (including critical care time)  Results for orders placed during the hospital encounter of 11/22/11  CBC      Component Value Range   WBC 13.0 (*) 4.0 - 10.5 (K/uL)   RBC 4.05  3.87 - 5.11 (MIL/uL)   Hemoglobin 12.7  12.0 - 15.0 (g/dL)   HCT 45.4  09.8 - 11.9 (%)   MCV 99.0  78.0 - 100.0 (fL)   MCH 31.4  26.0 - 34.0 (pg)   MCHC  31.7  30.0 - 36.0 (g/dL)   RDW 14.7  82.9 - 56.2 (%)   Platelets 160  150 - 400 (K/uL)  DIFFERENTIAL      Component Value Range   Neutrophils Relative 72  43 - 77 (%)   Neutro Abs 9.3 (*) 1.7 - 7.7 (K/uL)   Lymphocytes Relative 17  12 - 46 (%)   Lymphs Abs 2.2  0.7 - 4.0 (K/uL)   Monocytes Relative 8  3 - 12 (%)   Monocytes Absolute 1.0  0.1 - 1.0 (K/uL)   Eosinophils Relative 3  0 - 5 (%)   Eosinophils Absolute 0.4  0.0 - 0.7 (K/uL)   Basophils Relative 1  0 - 1 (%)   Basophils Absolute 0.1  0.0 - 0.1 (K/uL)  COMPREHENSIVE METABOLIC PANEL      Component Value Range   Sodium 138  135 - 145 (mEq/L)   Potassium 3.7  3.5 - 5.1 (mEq/L)   Chloride 100  96 - 112 (mEq/L)   CO2 27  19 - 32 (mEq/L)   Glucose, Bld 89  70 - 99 (mg/dL)   BUN 20  6 - 23 (mg/dL)   Creatinine, Ser 1.30 (*) 0.50 - 1.10 (mg/dL)   Calcium 9.9  8.4 - 86.5 (mg/dL)   Total Protein 7.5  6.0 - 8.3 (g/dL)   Albumin 3.6  3.5 - 5.2 (g/dL)   AST 26  0 - 37 (U/L)   ALT 14  0 - 35 (U/L)   Alkaline Phosphatase 134 (*) 39 - 117 (U/L)   Total Bilirubin 0.2 (*) 0.3 - 1.2 (mg/dL)   GFR calc non Af Amer 38 (*) >90 (mL/min)   GFR calc Af Amer 43 (*) >90 (mL/min)  TROPONIN I      Component Value Range   Troponin I <0.30  <0.30 (ng/mL)  URINALYSIS, ROUTINE W REFLEX MICROSCOPIC      Component Value Range   Color, Urine YELLOW  YELLOW    APPearance CLOUDY (*) CLEAR    Specific Gravity, Urine 1.011  1.005 - 1.030    pH 5.0  5.0 - 8.0    Glucose, UA NEGATIVE  NEGATIVE (mg/dL)   Hgb urine dipstick NEGATIVE  NEGATIVE    Bilirubin Urine NEGATIVE  NEGATIVE    Ketones, ur NEGATIVE  NEGATIVE (mg/dL)   Protein, ur NEGATIVE  NEGATIVE (mg/dL)   Urobilinogen, UA 0.2  0.0 - 1.0 (mg/dL)   Nitrite NEGATIVE  NEGATIVE    Leukocytes, UA SMALL (*) NEGATIVE   PRO B NATRIURETIC PEPTIDE      Component Value Range   Pro B Natriuretic peptide (BNP) 3645.0 (*) 0 - 125 (pg/mL)  URINE MICROSCOPIC-ADD ON      Component Value Range   Squamous  Epithelial /  LPF MANY (*) RARE    WBC, UA 3-6  <3 (WBC/hpf)   RBC / HPF 0-2  <3 (RBC/hpf)   Bacteria, UA FEW (*) RARE    Urine-Other AMORPHOUS URATES/PHOSPHATES                                                      Dg Chest 2 View  11/22/2011  *RADIOLOGY REPORT*  Clinical Data: Dizziness  CHEST - 2 VIEW  Comparison: 04/20/2010  Findings: Left subclavian AICD device is stable.  Mild cardiomegaly.  Normal vascularity.  Clear lungs.  No pneumothorax or pleural fluid.  IMPRESSION: Cardiomegaly without edema.  Original Report Authenticated By: Donavan Burnet, M.D.   Ct Head Wo Contrast  11/22/2011  *RADIOLOGY REPORT*  Clinical Data: Dizziness.  CT HEAD WITHOUT CONTRAST  Technique:  Contiguous axial images were obtained from the base of the skull through the vertex without contrast.  Comparison: CT head 07/09/2007.  Findings: Mild cerebral and cerebellar atrophy, slightly increased compared to prior study from 2008.  Multifocal areas of ill-defined decreased attenuation are seen scattered throughout the deep and periventricular white matter of the cerebral hemispheres bilaterally, slightly increased compared to prior study from 2008, but again most consistent with chronic microvascular ischemic changes.  There is a well-defined focus of decreased attenuation in the inferior aspect of the right putamen, and immediately posterior to the left caudate head, both of which are unchanged, and consistent with old lacunar infarctions.  No definite acute intracranial abnormality.  Specifically, no findings strongly suggestive of acute/subacute ischemia, mass, mass effect, hydrocephalus or abnormal intra or extra-axial fluid collections. No displaced skull fractures are identified.  Visualized paranasal sinuses and mastoids are well pneumatized.  IMPRESSION: 1.  No acute intracranial abnormalities. 2.  The study is remarkable for cerebral and cerebellar atrophy, chronic microvascular ischemic changes in the deep  and periventricular white matter of the cerebral hemispheres bilaterally and old basal ganglia lacunar infarctions, as above.  Original Report Authenticated By: Florencia Reasons, M.D.     1. Dizziness   2. Dyspnea   3. Congestive heart failure   4. CORONARY ARTERY DISEASE   5. Automatic implantable cardiac defibrillator in situ   6. Other primary cardiomyopathies       MDM  66 year old female with a history of CAD, CHF, hypertension, COPD and tobacco abuse presenting with a two-day history of worsening dizziness, intermittent left-sided chest pain and worsening dyspnea. Patient reports that she has had these symptoms for the past 3 years since her MI, but states they have worsened for the past 2 days. She states symptoms started after she was cleaning her bathroom with bleach, greased lightening and "the Works". She thought she may have inhaled some fumes which contributed to her worsening dizziness and dyspnea, but symptoms have persisted. She did have a fall 2 days ago while walking in her home. She had to go a step and states she became lightheaded and dizzy but did not completely syncopize. She does endorse intermittent left-sided chest pain which is not worse with exertion. She states she has both dizziness and vertiginous symptoms which are made worse with both going from a seated to a standing position, and with turning her head. Given that he symptoms have been so chronic and is not clearly a peripheral vertigo, along with the  fact that she has coronary artery disease a CT head was obtained which was negative for acute disease. Workup has revealed elevated BNP but is otherwise unremarkable. Given her persistent dizziness with recent fall and questionable near-syncope, along with worsening dyspnea and elevated BNP medicine was consulted and patient was admitted for further management.      Sheran Luz, MD 11/23/11 1610  Sheran Luz, MD 11/23/11 9604  Sheran Luz, MD 11/23/11 5409

## 2011-11-23 NOTE — Plan of Care (Signed)
Problem: Phase I Progression Outcomes Goal: EF % per last Echo/documented,Core Reminder form on chart Outcome: Completed/Met Date Met:  11/23/11 11/23/11 EF 25 - 30 %

## 2011-11-24 DIAGNOSIS — H8110 Benign paroxysmal vertigo, unspecified ear: Secondary | ICD-10-CM | POA: Diagnosis present

## 2011-11-24 DIAGNOSIS — I5021 Acute systolic (congestive) heart failure: Secondary | ICD-10-CM | POA: Diagnosis present

## 2011-11-24 LAB — PRO B NATRIURETIC PEPTIDE: Pro B Natriuretic peptide (BNP): 3002 pg/mL — ABNORMAL HIGH (ref 0–125)

## 2011-11-24 LAB — BASIC METABOLIC PANEL
CO2: 27 mEq/L (ref 19–32)
Calcium: 10 mg/dL (ref 8.4–10.5)
Creatinine, Ser: 1.27 mg/dL — ABNORMAL HIGH (ref 0.50–1.10)
GFR calc non Af Amer: 43 mL/min — ABNORMAL LOW (ref >90)
Sodium: 139 mEq/L (ref 135–145)

## 2011-11-24 MED ORDER — FUROSEMIDE 40 MG PO TABS: 40.0000 mg | ORAL_TABLET | Freq: Two times a day (BID) | ORAL | Status: DC

## 2011-11-24 NOTE — Progress Notes (Signed)
Subjective: Dizziness slightly better, up with family moving around ok Patient is non complaint with diet-and fluid restriction.     Objective: Vital signs in last 24 hours: Filed Vitals:   11/24/11 0524 11/24/11 0525 11/24/11 0528 11/24/11 1008  BP: 123/77 114/75 110/71 120/70  Pulse: 60 65 66   Temp:      TempSrc:      Resp:      Height:      Weight:      SpO2:       Weight change: -1.678 kg (-3 lb 11.2 oz)  Intake/Output Summary (Last 24 hours) at 11/24/11 1107 Last data filed at 11/24/11 4193  Gross per 24 hour  Intake    700 ml  Output   1850 ml  Net  -1150 ml    Physical Exam: General: Awake, Oriented, No acute distress. HEENT: EOMI. Neck: Supple CV: S1 and S2, RRR Lungs: few crackles B/L Abdomen: Soft, Nontender, Nondistended, +bowel sounds. Ext: Good pulses. Trace edema.   Lab Results:  Basename 11/24/11 0620 11/23/11 0540 11/22/11 2112  NA 139 138 --  K 3.7 3.7 --  CL 101 102 --  CO2 27 26 --  GLUCOSE 87 93 --  BUN 17 16 --  CREATININE 1.27* 1.16* --  CALCIUM 10.0 9.3 --  MG -- -- 1.8  PHOS -- -- 2.6    Basename 11/22/11 1541  AST 26  ALT 14  ALKPHOS 134*  BILITOT 0.2*  PROT 7.5  ALBUMIN 3.6   No results found for this basename: LIPASE:2,AMYLASE:2 in the last 72 hours  Basename 11/23/11 0540 11/22/11 1541  WBC 9.9 13.0*  NEUTROABS -- 9.3*  HGB 11.7* 12.7  HCT 35.1* 40.1  MCV 95.9 99.0  PLT 155 160    Basename 11/23/11 1223 11/23/11 0540 11/22/11 2111  CKTOTAL 39 39 46  CKMB 1.7 1.9 1.9  CKMBINDEX -- -- --  TROPONINI <0.30 <0.30 <0.30   No components found with this basename: POCBNP:3 No results found for this basename: DDIMER:2 in the last 72 hours No results found for this basename: HGBA1C:2 in the last 72 hours No results found for this basename: CHOL:2,HDL:2,LDLCALC:2,TRIG:2,CHOLHDL:2,LDLDIRECT:2 in the last 72 hours  Basename 11/23/11 0540  TSH 2.483  T4TOTAL --  T3FREE --  THYROIDAB --   No results found for this  basename: VITAMINB12:2,FOLATE:2,FERRITIN:2,TIBC:2,IRON:2,RETICCTPCT:2 in the last 72 hours  Micro Results: Recent Results (from the past 240 hour(s))  URINE CULTURE     Status: Normal   Collection Time   11/22/11  3:48 PM      Component Value Range Status Comment   Specimen Description URINE, CLEAN CATCH   Final    Special Requests ADDED [id number]   Final    Culture  Setup Time [id number]   Final    Colony Count 20,OOO COLONIES/ML   Final    Culture     Final    Value: Multiple bacterial morphotypes present, none predominant. Suggest appropriate recollection if clinically indicated.   Report Status 11/23/2011 FINAL   Final     Studies/Results: Dg Chest 2 View  11/22/2011  *RADIOLOGY REPORT*  Clinical Data: Dizziness  CHEST - 2 VIEW  Comparison: 04/20/2010  Findings: Left subclavian AICD device is stable.  Mild cardiomegaly.  Normal vascularity.  Clear lungs.  No pneumothorax or pleural fluid.  IMPRESSION: Cardiomegaly without edema.  Original Report Authenticated By: Donavan Burnet, M.D.   Ct Head Wo Contrast  11/22/2011  *RADIOLOGY REPORT*  Clinical Data:  Dizziness.  CT HEAD WITHOUT CONTRAST  Technique:  Contiguous axial images were obtained from the base of the skull through the vertex without contrast.  Comparison: CT head 07/09/2007.  Findings: Mild cerebral and cerebellar atrophy, slightly increased compared to prior study from 2008.  Multifocal areas of ill-defined decreased attenuation are seen scattered throughout the deep and periventricular white matter of the cerebral hemispheres bilaterally, slightly increased compared to prior study from 2008, but again most consistent with chronic microvascular ischemic changes.  There is a well-defined focus of decreased attenuation in the inferior aspect of the right putamen, and immediately posterior to the left caudate head, both of which are unchanged, and consistent with old lacunar infarctions.  No definite acute intracranial abnormality.   Specifically, no findings strongly suggestive of acute/subacute ischemia, mass, mass effect, hydrocephalus or abnormal intra or extra-axial fluid collections. No displaced skull fractures are identified.  Visualized paranasal sinuses and mastoids are well pneumatized.  IMPRESSION: 1.  No acute intracranial abnormalities. 2.  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  Original Report Authenticated By: Florencia Reasons, M.D.    Medications: I have reviewed the patient's current medications. Scheduled Meds:    . allopurinol  100 mg Oral Daily  . aspirin  81 mg Oral Daily  . atorvastatin  40 mg Oral q1800  . cefTRIAXone (ROCEPHIN)  IV  1 g Intravenous Q24H  . cholecalciferol  1,000 Units Oral Daily  . clopidogrel  75 mg Oral Daily  . enoxaparin  30 mg Subcutaneous Q24H  . ezetimibe  10 mg Oral Daily  . ferrous sulfate  325 mg Oral Q breakfast  . furosemide  40 mg Intravenous Once  . furosemide  40 mg Oral Daily  . metoprolol succinate  50 mg Oral Daily  . pantoprazole  40 mg Oral Daily  . potassium chloride SA  20 mEq Oral Daily  . ramipril  2.5 mg Oral Daily  . sodium chloride  3 mL Intravenous Q12H  . sodium chloride  3 mL Intravenous Q12H   Continuous Infusions:  PRN Meds:.sodium chloride, clonazePAM, HYDROcodone-acetaminophen, sodium chloride  Assessment/Plan: Principal Problem:  *Dizziness- BPV: seen by PT see plan below, patient and family refuses SNF and plan to take home with 24 hour care.  Patient on multiple meds that could contribute but unwilling to stop taking: ambien, klonopin, and pain medications TSH ok   SOB (shortness of breath)- will give extra dose of lasix, await echo- no changes in EF: EF 20%-25%, on ACE inhibitor  AECHF- systolic, lasix, had dose recently decreased,  will increase back and have close follow up for kidney function   CKD (chronic kidney disease)- at baseline  Leukocytosis- resolved  ?UTI- treated, urine cx negative  Tobacco abuse- continues to abuse and not willing to stop  Patient unsteady with cane and this PT does not recommend her ambulating alone with cane at home. Will either need 24 hour care or RW to use for safety. Ultimately recommend that patient go to NH for therapy short term but paitient not agreeable.  home wtih 24 hour care and Outpt. PT balance program (such as [facility] program) recommended.  Will get rolling walker as well    LOS: 2 days  Murle Otting, DO 11/24/2011, 11:07 AM

## 2011-11-24 NOTE — Discharge Summary (Signed)
Discharge Summary  [name] MR#: [id number]  DOB:07/11/1946  Date of Admission: 11/22/2011 Date of Discharge: 11/24/2011  Patient's PCP: Johny Blamer, MD, MD  Attending Physician:Daina Cara    Discharge Diagnoses: Principal Problem:  *Systolic CHF, acute on Chronic systolic heart failure  LEFT VENTRICULAR FUNCTION, DECREASED- EF 20-25%  Dizziness secondary to Benign positional vertigo   SOB (shortness of breath)  CKD (chronic kidney disease)  medical nonadherence to diet Tobacco abuse   Brief Admitting History and Physical [name] is a 66 y.o. caucaian female who comes to the ER c/o worsening dizziness. Has been going on for 3 years but worsened after she cleaned out her bathroom with several different agents. She has a history of CAD status post MI, CHF and anemia. She states that she has chronic dizziness, but in the past 2-3 days is gotten worse from baseline. She had a fall at home 2 days ago for her dizziness. She states that she was lightheaded and dizzy but denies any syncope. She states her dizziness is worse when going from seated to standing. She does endorse orthopnea but denies any worsening leg swelling. No tilt test was done in ER. Complains of CP. States she has had recent UTIs treated with abxs. States she can not live without her klonopin/ambien/and Vicodin.  Generalized weakness and fatigue for last few weeks   Discharge Medications Medication List  As of 11/24/2011  1:00 PM   STOP taking these medications         AUGMENTIN 875-125 MG per tablet      citalopram 40 MG tablet      ibuprofen 800 MG tablet         TAKE these medications         allopurinol 100 MG tablet   Commonly known as: ZYLOPRIM   Take 100 mg by mouth daily.      aspirin 81 MG tablet   Take 81 mg by mouth daily.      clonazePAM 1 MG tablet   Commonly known as: KLONOPIN   Take 0.5-2 mg by mouth 2 (two) times daily as needed. Take 1 tablet in the morning and 2 tablets  in the evening      clopidogrel 75 MG tablet   Commonly known as: PLAVIX   Take 1 tablet (75 mg total) by mouth daily.      estrogens (conjugated) 0.625 MG tablet   Commonly known as: PREMARIN   Take 0.625 mg by mouth daily. Take daily for 21 days then do not take for 7 days.      eucerin cream   Apply 1 application topically 2 (two) times daily as needed.      ezetimibe 10 MG tablet   Commonly known as: ZETIA   Take 10 mg by mouth daily.      ferrous sulfate 325 (65 FE) MG tablet   Take 325 mg by mouth daily with breakfast.      fish oil-omega-3 fatty acids 1000 MG capsule   Take 2 g by mouth daily.      furosemide 40 MG tablet   Commonly known as: LASIX   Take 1 tablet (40 mg total) by mouth 2 (two) times daily.      HYDROcodone-acetaminophen 5-500 MG per tablet   Commonly known as: VICODIN   Take 1 tablet by mouth as directed.      metoprolol succinate 50 MG 24 hr tablet   Commonly known as: TOPROL-XL   Take  1 tablet (50 mg total) by mouth daily.      nitroGLYCERIN 0.4 MG SL tablet   Commonly known as: NITROSTAT   Place 0.4 mg under the tongue every 5 (five) minutes as needed. For chest pain      pantoprazole 40 MG tablet   Commonly known as: PROTONIX   Take 40 mg by mouth daily.      potassium chloride SA 20 MEQ tablet   Commonly known as: K-DUR,KLOR-CON   Take 1 tablet (20 mEq total) by mouth daily.      ramipril 2.5 MG capsule   Commonly known as: ALTACE   TAKE 1 CAPSULE EVERY DAY      rosuvastatin 20 MG tablet   Commonly known as: CRESTOR   Take 1 tablet (20 mg total) by mouth daily.      Vitamin D 1000 UNITS capsule   Take 1,000 Units by mouth daily.      zolpidem 10 MG tablet   Commonly known as: AMBIEN   Take 10 mg by mouth at bedtime as needed.            Hospital Course: Dizziness- diagnosed with BPV: seen by PT : recommended SNF but family refused and plan to take her home with 24 hour care. PT also recommends out patient PT balance  program such as [facility], have made referral to case managers.  Patient is also on multiple meds that could contribute to her unsteady gait but she is unwilling to stop taking: ambien, klonopin, and pain medications  TSH ok   SOB (shortness of breath)- due to AECHF- systolic, lasix, had dose recently decreased of lasix, will increase back and have close follow up for kidney function as an outpatient  CKD (chronic kidney disease)- below baseline 1.2 vs 1.4)  Leukocytosis- resolved   ?UTI- treated, urine cx negative   Tobacco abuse- continues to abuse and not willing to stop   Non-compliance to medical therapy: not interested in cutting salt or quitting smoking, had long talk with family regarding repercussions of this    Day of Discharge BP 120/70  Pulse 66  Temp(Src) 98 F (36.7 C) (Oral)  Resp 20  Ht 5\' 4"  (1.626 m)  Wt 53.661 kg (118 lb 4.8 oz)  BMI 20.31 kg/m2  SpO2 94%  Results for orders placed during the hospital encounter of 11/22/11 (from the past 48 hour(s))  CBC     Status: Abnormal   Collection Time   11/22/11  3:41 PM      Component Value Range Comment   WBC 13.0 (*) 4.0 - 10.5 (K/uL)    RBC 4.05  3.87 - 5.11 (MIL/uL)    Hemoglobin 12.7  12.0 - 15.0 (g/dL)    HCT 16.1  09.6 - 04.5 (%)    MCV 99.0  78.0 - 100.0 (fL)    MCH 31.4  26.0 - 34.0 (pg)    MCHC 31.7  30.0 - 36.0 (g/dL)    RDW 40.9  81.1 - 91.4 (%)    Platelets 160  150 - 400 (K/uL)   DIFFERENTIAL     Status: Abnormal   Collection Time   11/22/11  3:41 PM      Component Value Range Comment   Neutrophils Relative 72  43 - 77 (%)    Neutro Abs 9.3 (*) 1.7 - 7.7 (K/uL)    Lymphocytes Relative 17  12 - 46 (%)    Lymphs Abs 2.2  0.7 - 4.0 (K/uL)  Monocytes Relative 8  3 - 12 (%)    Monocytes Absolute 1.0  0.1 - 1.0 (K/uL)    Eosinophils Relative 3  0 - 5 (%)    Eosinophils Absolute 0.4  0.0 - 0.7 (K/uL)    Basophils Relative 1  0 - 1 (%)    Basophils Absolute 0.1  0.0 - 0.1 (K/uL)   COMPREHENSIVE  METABOLIC PANEL     Status: Abnormal   Collection Time   11/22/11  3:41 PM      Component Value Range Comment   Sodium 138  135 - 145 (mEq/L)    Potassium 3.7  3.5 - 5.1 (mEq/L)    Chloride 100  96 - 112 (mEq/L)    CO2 27  19 - 32 (mEq/L)    Glucose, Bld 89  70 - 99 (mg/dL)    BUN 20  6 - 23 (mg/dL)    Creatinine, Ser 0.45 (*) 0.50 - 1.10 (mg/dL)    Calcium 9.9  8.4 - 10.5 (mg/dL)    Total Protein 7.5  6.0 - 8.3 (g/dL)    Albumin 3.6  3.5 - 5.2 (g/dL)    AST 26  0 - 37 (U/L)    ALT 14  0 - 35 (U/L)    Alkaline Phosphatase 134 (*) 39 - 117 (U/L)    Total Bilirubin 0.2 (*) 0.3 - 1.2 (mg/dL)    GFR calc non Af Amer 38 (*) >90 (mL/min)    GFR calc Af Amer 43 (*) >90 (mL/min)   TROPONIN I     Status: Normal   Collection Time   11/22/11  3:43 PM      Component Value Range Comment   Troponin I <0.30  <0.30 (ng/mL)   PRO B NATRIURETIC PEPTIDE     Status: Abnormal   Collection Time   11/22/11  3:43 PM      Component Value Range Comment   Pro B Natriuretic peptide (BNP) 3645.0 (*) 0 - 125 (pg/mL)   URINALYSIS, ROUTINE W REFLEX MICROSCOPIC     Status: Abnormal   Collection Time   11/22/11  3:48 PM      Component Value Range Comment   Color, Urine YELLOW  YELLOW     APPearance CLOUDY (*) CLEAR     Specific Gravity, Urine 1.011  1.005 - 1.030     pH 5.0  5.0 - 8.0     Glucose, UA NEGATIVE  NEGATIVE (mg/dL)    Hgb urine dipstick NEGATIVE  NEGATIVE     Bilirubin Urine NEGATIVE  NEGATIVE     Ketones, ur NEGATIVE  NEGATIVE (mg/dL)    Protein, ur NEGATIVE  NEGATIVE (mg/dL)    Urobilinogen, UA 0.2  0.0 - 1.0 (mg/dL)    Nitrite NEGATIVE  NEGATIVE     Leukocytes, UA SMALL (*) NEGATIVE    URINE MICROSCOPIC-ADD ON     Status: Abnormal   Collection Time   11/22/11  3:48 PM      Component Value Range Comment   Squamous Epithelial / LPF MANY (*) RARE     WBC, UA 3-6  <3 (WBC/hpf)    RBC / HPF 0-2  <3 (RBC/hpf)    Bacteria, UA FEW (*) RARE     Urine-Other AMORPHOUS URATES/PHOSPHATES     URINE  CULTURE     Status: Normal   Collection Time   11/22/11  3:48 PM      Component Value Range Comment   Specimen Description URINE, CLEAN CATCH  Special Requests ADDED [id number]      Culture  Setup Time [id number]      Colony Count 20,OOO COLONIES/ML      Culture        Value: Multiple bacterial morphotypes present, none predominant. Suggest appropriate recollection if clinically indicated.   Report Status 11/23/2011 FINAL     CARDIAC PANEL(CRET KIN+CKTOT+MB+TROPI)     Status: Normal   Collection Time   11/22/11  9:11 PM      Component Value Range Comment   Total CK 46  7 - 177 (U/L)    CK, MB 1.9  0.3 - 4.0 (ng/mL)    Troponin I <0.30  <0.30 (ng/mL)    Relative Index RELATIVE INDEX IS INVALID  0.0 - 2.5    PHOSPHORUS     Status: Normal   Collection Time   11/22/11  9:12 PM      Component Value Range Comment   Phosphorus 2.6  2.3 - 4.6 (mg/dL)   MAGNESIUM     Status: Normal   Collection Time   11/22/11  9:12 PM      Component Value Range Comment   Magnesium 1.8  1.5 - 2.5 (mg/dL)   TSH     Status: Normal   Collection Time   11/23/11  5:40 AM      Component Value Range Comment   TSH 2.483  0.350 - 4.500 (uIU/mL)   CARDIAC PANEL(CRET KIN+CKTOT+MB+TROPI)     Status: Normal   Collection Time   11/23/11  5:40 AM      Component Value Range Comment   Total CK 39  7 - 177 (U/L)    CK, MB 1.9  0.3 - 4.0 (ng/mL)    Troponin I <0.30  <0.30 (ng/mL)    Relative Index RELATIVE INDEX IS INVALID  0.0 - 2.5    BASIC METABOLIC PANEL     Status: Abnormal   Collection Time   11/23/11  5:40 AM      Component Value Range Comment   Sodium 138  135 - 145 (mEq/L)    Potassium 3.7  3.5 - 5.1 (mEq/L)    Chloride 102  96 - 112 (mEq/L)    CO2 26  19 - 32 (mEq/L)    Glucose, Bld 93  70 - 99 (mg/dL)    BUN 16  6 - 23 (mg/dL)    Creatinine, Ser 7.82 (*) 0.50 - 1.10 (mg/dL)    Calcium 9.3  8.4 - 10.5 (mg/dL)    GFR calc non Af Amer 48 (*) >90 (mL/min)    GFR calc Af Amer 56 (*) >90 (mL/min)   CBC      Status: Abnormal   Collection Time   11/23/11  5:40 AM      Component Value Range Comment   WBC 9.9  4.0 - 10.5 (K/uL)    RBC 3.66 (*) 3.87 - 5.11 (MIL/uL)    Hemoglobin 11.7 (*) 12.0 - 15.0 (g/dL)    HCT 95.6 (*) 21.3 - 46.0 (%)    MCV 95.9  78.0 - 100.0 (fL)    MCH 32.0  26.0 - 34.0 (pg)    MCHC 33.3  30.0 - 36.0 (g/dL)    RDW 08.6  57.8 - 46.9 (%)    Platelets 155  150 - 400 (K/uL)   CARDIAC PANEL(CRET KIN+CKTOT+MB+TROPI)     Status: Normal   Collection Time   11/23/11 12:23 PM      Component Value Range Comment  Total CK 39  7 - 177 (U/L)    CK, MB 1.7  0.3 - 4.0 (ng/mL)    Troponin I <0.30  <0.30 (ng/mL)    Relative Index RELATIVE INDEX IS INVALID  0.0 - 2.5    PRO B NATRIURETIC PEPTIDE     Status: Abnormal   Collection Time   11/24/11  6:20 AM      Component Value Range Comment   Pro B Natriuretic peptide (BNP) 3002.0 (*) 0 - 125 (pg/mL)   BASIC METABOLIC PANEL     Status: Abnormal   Collection Time   11/24/11  6:20 AM      Component Value Range Comment   Sodium 139  135 - 145 (mEq/L)    Potassium 3.7  3.5 - 5.1 (mEq/L)    Chloride 101  96 - 112 (mEq/L)    CO2 27  19 - 32 (mEq/L)    Glucose, Bld 87  70 - 99 (mg/dL)    BUN 17  6 - 23 (mg/dL)    Creatinine, Ser 7.82 (*) 0.50 - 1.10 (mg/dL)    Calcium 95.6  8.4 - 10.5 (mg/dL)    GFR calc non Af Amer 43 (*) >90 (mL/min)    GFR calc Af Amer 50 (*) >90 (mL/min)     Dg Chest 2 View  11/22/2011  *RADIOLOGY REPORT*  Clinical Data: Dizziness  CHEST - 2 VIEW  Comparison: 04/20/2010  Findings: Left subclavian AICD device is stable.  Mild cardiomegaly.  Normal vascularity.  Clear lungs.  No pneumothorax or pleural fluid.  IMPRESSION: Cardiomegaly without edema.  Original Report Authenticated By: Donavan Burnet, M.D.   Ct Head Wo Contrast  11/22/2011  *RADIOLOGY REPORT*  Clinical Data: Dizziness.  CT HEAD WITHOUT CONTRAST  Technique:  Contiguous axial images were obtained from the base of the skull through the vertex without contrast.   Comparison: CT head 07/09/2007.  Findings: Mild cerebral and cerebellar atrophy, slightly increased compared to prior study from 2008.  Multifocal areas of ill-defined decreased attenuation are seen scattered throughout the deep and periventricular white matter of the cerebral hemispheres bilaterally, slightly increased compared to prior study from 2008, but again most consistent with chronic microvascular ischemic changes.  There is a well-defined focus of decreased attenuation in the inferior aspect of the right putamen, and immediately posterior to the left caudate head, both of which are unchanged, and consistent with old lacunar infarctions.  No definite acute intracranial abnormality.  Specifically, no findings strongly suggestive of acute/subacute ischemia, mass, mass effect, hydrocephalus or abnormal intra or extra-axial fluid collections. No displaced skull fractures are identified.  Visualized paranasal sinuses and mastoids are well pneumatized.  IMPRESSION: 1.  No acute intracranial abnormalities. 2.  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  Original Report Authenticated By: Florencia Reasons, M.D.     Disposition: home with 24 hour care  Diet: cardiac  Activity: gradually increase  Follow-up Appts: Discharge Orders    Future Appointments: Provider: Department: Dept Phone: Center:   12/13/2011 9:05 AM [facility] [phone] [facility]   02/05/2012 9:30 AM Marinus Maw, MD [facility] [phone] [facility]   04/29/2012 1:00 PM [facility] [phone] [facility]   04/29/2012 1:30 PM [facility] [phone] [facility]     Future Orders Please Complete By  Expires   Diet - low sodium heart healthy      Increase activity slowly       Scheduling Instructions:   Use cane or rolling walker with 24 hour care   Walk with assistance      Heart Failure patients record your daily weight using the same scale at the same time of day      STOP any activity that causes chest pain, shortness of breath, dizziness, sweating, or exessive weakness      Discharge instructions      Comments:   Follow up with outpatient PT- [facility] (referral made) Stop smoking Be compliant with cardiac diet BMP in 1 week with PCP to follow renal function Use rolling walker or cane with assistance 24 hour care til released by PT   Driving Restrictions      Comments:   No driving til dizziness resolves   (HEART FAILURE PATIENTS) Call MD:  Anytime you have any of the following symptoms: 1) 3 pound weight gain in 24 hours or 5 pounds in 1 week 2) shortness of breath, with or without a dry hacking cough 3) swelling in the hands, feet or stomach 4) if you have to sleep on extra pillows at night in order to breathe.      Beta Blocker already ordered      ACE Inhibitor / ARB already ordered         TESTS THAT NEED FOLLOW-UP BMP in 1 week with PCP for creatinine  Time spent on discharge, talking to the patient, and coordinating care: 65 mins.   SignedBenjamine Mola, Ameliya Nicotra, DO 11/24/2011, 1:00 PM

## 2011-11-24 NOTE — ED Provider Notes (Signed)
I saw and evaluated the patient, reviewed the resident's note and I agree with the findings and plan.  Cyndra Numbers, MD 11/24/11 [phone]

## 2011-11-24 NOTE — Discharge Instructions (Signed)
Benign Positional Vertigo  Vertigo means you feel like you or your surroundings are moving when they are not. Benign positional vertigo is the most common form of vertigo. Benign means that the cause of your condition is not serious. Benign positional vertigo is more common in older adults.  CAUSES  Benign positional vertigo is the result of an upset in the labyrinth system. This is an area in the middle ear that helps control your balance. This may be caused by a viral infection, head injury, or repetitive motion. However, often no specific cause is found.  SYMPTOMS  Symptoms of benign positional vertigo occur when you move your head or eyes in different directions. Some of the symptoms may include:  Loss of balance and falls.  Vomiting.  Blurred vision.  Dizziness.  Nausea.  Involuntary eye movements (nystagmus).  DIAGNOSIS  Benign positional vertigo is usually diagnosed by physical exam. If the specific cause of your benign positional vertigo is unknown, your caregiver may perform imaging tests, such as magnetic resonance imaging (MRI) or computed tomography (CT).  TREATMENT  Your caregiver may recommend movements or procedures to correct the benign positional vertigo. Medicines such as meclizine, benzodiazepines, and medicines for nausea may be used to treat your symptoms. In rare cases, if your symptoms are caused by certain conditions that affect the inner ear, you may need surgery.  HOME CARE INSTRUCTIONS  Follow your caregiver's instructions.  Move slowly. Do not make sudden body or head movements.  Avoid driving.  Avoid operating heavy machinery.  Avoid performing any tasks that would be dangerous to you or others during a vertigo episode.  Drink enough fluids to keep your urine clear or pale yellow.  SEEK IMMEDIATE MEDICAL CARE IF:  You develop problems with walking, weakness, numbness, or using your arms, hands, or legs.  You have difficulty speaking.  You develop severe  headaches.  Your nausea or vomiting continues or gets worse.  You develop visual changes.  Your family or friends notice any behavioral changes.  Your condition gets worse.  You have a fever.  You develop a stiff neck or sensitivity to light.  MAKE SURE YOU:  Understand these instructions.  Will watch your condition.  Will get help right away if you are not doing well or get worse  Heart Failure  Heart failure (HF) means your heart has trouble pumping blood. The blood is not circulated very well in your body because your heart is weak. HF may cause blood to back up into your lungs. This is commonly called "fluid in the lungs." HF may also cause your ankles and legs to puff up (swell). It is important to take good care of yourself when you have HF.  HOME CARE  Medicine  Take your medicine as told by your doctor.  Do not stop taking your medicine unless told to by your doctor.  Be sure to get your medicine refilled before it runs out.  Do not skip any doses of medicine.  Tell your doctor if you cannot afford your medicine.  Keep a list of all the medicine you take. This should include the name, how much you take, and when you take it.  Ask your doctor if you have any questions about your medicine. Do not take over-the-counter medicine unless your doctor says it is okay.  What you eat  Do not drink alcohol unless your doctor says it is okay.  Avoid food that is high in fat. Avoid foods fried in oil  or made with fat.  Eat a healthy diet. A dietitian can help you with healthy food choices.  Limit how much salt you eat. Do not eat more than 1500 milligrams (mg) of salt (sodium) a day.  Do not add salt to your food.  Do not eat food made with a lot of salt. Here are some examples:  Canned vegetables.  Canned soups.  Canned drinks.  Hot dogs.  Fast food.  Pizza.  Chips.  Check your weight  Weigh yourself every morning. You should do this after you pee (urinate) and before you eat  breakfast.  Wear the same amount of clothes each time you weigh yourself.  Write down your weight every day. Tell your doctor if you gain 3 lb/1.4 kg or more in 1 day or 5 lb/2.3 kg in a week.  Blood pressure monitoring  Buy a home blood pressure cuff.  Check your blood pressure as told by your doctor. Write down your blood pressure numbers on a sheet of paper.  Bring your blood pressure numbers to your doctor visits.  Smoking  Smoking is bad for your heart.  Ask your doctor how to stop smoking.  Exercise  Talk to your doctor about exercise.  Ask how much exercise is right for you.  Exercise as much as you can. Stop if you feel tired, have problems breathing, or have chest pain.  Keep all your doctor appointments.  GET HELP RIGHT AWAY IF:  You have trouble breathing.  You have a cough that does not go away.  You cannot sleep because you have trouble breathing.  You gain 3 lb/1.4 kg or more in 1 day or 5 lb/2.3 kg in a week.  You have puffy ankles or legs.  You have an enlarged (bloated) belly (abdomen).  You pass out (faint).  You have really bad chest pain or pressure. This includes pain or pressure in your:  Arms.  Jaw.  Neck.  Back.  If you have any of the above problems, call your local emergency services (911 in [place] Do not drive yourself to the hospital.  MAKE SURE YOU:  Understand these instructions.  Will watch your condition.  Will get help right away if you are not doing well or get worse

## 2011-11-24 NOTE — Progress Notes (Signed)
Physical Therapy Treatment Patient Details Name: [name] MRN: [id number] DOB: September 26, 1945 Today's Date: 11/24/2011  PT Assessment/Plan  PT - Assessment/Plan Comments on Treatment Session: Patient admitted with dizziness and CP.  Found to have Bil BPPV and was treated for that yesterday with symptoms resolved.  Could not elicit symptoms with gaze stability exercises today.  Patient uses cane at home and ambulated with cane today with PT.  Patient unsteady with cane and this PT does not recommend her ambulating alone with cane at home.  Will either need 24 hour care or RW to use for safety.  Ultimately recommend that patient go to NH for therapy short term.  If paitient not agreeable as stated previously, home wtih 24 hour care and Outpt. PT balance program (such as BOOST program) recommended.   PT Plan: Discharge plan needs to be updated;Frequency remains appropriate PT Frequency: Min 3X/week Recommendations for Other Services: OT consult Follow Up Recommendations: Outpatient PT;Skilled nursing facility;Supervision/Assistance - 24 hour (Outpt PT  vs NHP (needs 24 hour care at present due to fall risk) Equipment Recommended: Rolling walker with 5" wheels if patient goes home for safety PT Goals  Acute Rehab PT Goals PT Goal: Sit to Stand - Progress: Progressing toward goal PT Goal: Stand to Sit - Progress: Progressing toward goal PT Goal: Ambulate - Progress: Progressing toward goal PT Goal: Perform Home Exercise Program - Progress: Progressing toward goal Additional Goals PT Goal: Additional Goal #1 - Progress: Met PT Goal: Additional Goal #2 - Progress: Met  PT Treatment Precautions/Restrictions  Precautions Precautions: Fall Precaution Comments: Unsteady even with cane Required Braces or Orthoses: No Restrictions Weight Bearing Restrictions: No Mobility (including Balance) Bed Mobility Rolling Left: 7: Independent Left Sidelying to Sit: 7: Independent Transfers Sit to Stand:  5: Supervision;With upper extremity assist;From bed Sit to Stand Details (indicate cue type and reason): unsteady on feet upon initial standing taking a few minutes to attain balance Stand to Sit: 5: Supervision;With upper extremity assist;To bed Ambulation/Gait Ambulation/Gait Assistance: 4: Min assist Ambulation/Gait Assistance Details (indicate cue type and reason): Used cane.  HAd 2 significant LOB in which patient used stepping strategy of a few staggering steps to self correct.  Continues to be at fall risk in challenging situations/environment.  Cannot accept more than minimal challenges to balance. Ambulation Distance (Feet): 250 Feet Assistive device: Straight cane Gait Pattern: Step-through pattern;Decreased stride length;Ataxic;Decreased dorsiflexion - right;Decreased dorsiflexion - left Stairs: No Wheelchair Mobility Wheelchair Mobility: No  Posture/Postural Control Posture/Postural Control: No significant limitations Balance Balance Assessed: Yes Static Sitting Balance Static Sitting - Balance Support: No upper extremity supported;Feet supported Static Sitting - Level of Assistance: 7: Independent Static Sitting - Comment/# of Minutes: Repeated vestibular assessment.  Modified Hallpike repeated without symptoms.  Patient states that her balance is still unsteady but she does not have any "spinning" dizziness like yesterday.  Appears that Epley resolved her vertigo issues.  Also checked gaze stability and patient performed exercises without symptoms.   Static Standing Balance Single Leg Stance - Left Leg: 4  Rhomberg - Eyes Opened: 30  (incr postural sway throughout, posterior mostly) Dynamic Standing Balance Dynamic Standing - Balance Support: Right upper extremity supported;During functional activity Dynamic Standing - Level of Assistance: 4: Min assist Dynamic Standing - Balance Activities: Lateral lean/weight shifting;Reaching across midline;Reaching for objects;Compliant  surfaces Dynamic Standing - Comments: min assist needed for balance as patient cannot steady self in challenging situations. Exercise  Other Exercises Other Exercises: Toe raises x 10 with  min assist to steady patient; weakness noted with difficulty to complete 10 exercises.  Patient losing balance posteriorly each time. Other Exercises: Heel raises x 10 with min assist to steady patient; weakness noted with difficulty to complete 10 reps. End of Session PT - End of Session Equipment Utilized During Treatment: Gait belt Activity Tolerance: Patient limited by fatigue Patient left: in bed;with call bell in reach;with family/visitor present Nurse Communication: Mobility status for transfers;Mobility status for ambulation General Behavior During Session: [facility] for tasks performed Cognition: [facility] for tasks performed  INGOLD,Gao Mitnick 11/24/2011, 9:31 AM [facility] [phone] (pager)

## 2011-11-24 NOTE — Progress Notes (Signed)
11/24/11 1345 TC to [place] with [facility] to arrange DME rolling walker. Order placed in TLC. Tera Mater, RN, BSN Nurse Case Manager [phone]

## 2011-11-25 NOTE — Progress Notes (Signed)
   CARE MANAGEMENT NOTE 11/25/2011  Patient:  [name], [name]   Account Number:  [id number]  Date Initiated:  11/23/2011  Documentation initiated by:  Donn Pierini  Subjective/Objective Assessment:   Pt admitted with dizziness     Action/Plan:   PTA pt lived at home alone, was independent with ADLs   Anticipated DC Date:  11/24/2011   Anticipated DC Plan:  HOME/SELF CARE      DC Planning Services  CM consult      Choice offered to / List presented to:     DME arranged  Levan Hurst      DME agency  [facility]        Status of service:  Completed, signed off Medicare Important Message given?   (If response is "NO", the following Medicare IM given date fields will be blank) Date Medicare IM given:   Date Additional Medicare IM given:    Discharge Disposition:  HOME/SELF CARE  Per UR Regulation:    Comments:  11/25/2011 1330 Faxed orders, d/c summary and facesheet to [facility] for outpt therapy. Contacted pt and provided address and phone number. Left message.  Isidoro Donning RN CCM Case Mgmt phone [phone]  PCP- Holley Bouche  11/24/11 1345 TC to [place] with [facility] to arrange DME rolling walker. Order placed in TLC. Tera Mater, RN, BSN Nurse Case Manager [phone]   11/23/11- 1420 -Donn Pierini RN, BSN [phone] Spoke with pt at bedside- per conversation pt states she lives at home alone, but has daughter [name] who lives 4 houses down from her. Pt states she uses a cane, and gets her medications from [facility]- she reports she has medication benefits with Medicare and Medicaid. Anticipate d/c home, CM will follow for potential d/c needs.

## 2011-11-30 ENCOUNTER — Ambulatory Visit: Payer: Medicare Other | Attending: Internal Medicine | Admitting: Physical Therapy

## 2011-11-30 DIAGNOSIS — IMO0001 Reserved for inherently not codable concepts without codable children: Secondary | ICD-10-CM | POA: Insufficient documentation

## 2011-11-30 DIAGNOSIS — R269 Unspecified abnormalities of gait and mobility: Secondary | ICD-10-CM | POA: Insufficient documentation

## 2011-12-03 ENCOUNTER — Ambulatory Visit: Payer: Medicare Other | Admitting: Physical Therapy

## 2011-12-06 ENCOUNTER — Ambulatory Visit: Payer: Medicare Other | Admitting: Physical Therapy

## 2011-12-07 ENCOUNTER — Ambulatory Visit (INDEPENDENT_AMBULATORY_CARE_PROVIDER_SITE_OTHER): Payer: Medicare Other | Admitting: Physician Assistant

## 2011-12-07 ENCOUNTER — Encounter: Payer: Self-pay | Admitting: Physician Assistant

## 2011-12-07 ENCOUNTER — Encounter: Payer: Self-pay | Admitting: Internal Medicine

## 2011-12-07 VITALS — BP 108/68 | HR 51 | Ht 63.0 in | Wt 129.0 lb

## 2011-12-07 DIAGNOSIS — I5022 Chronic systolic (congestive) heart failure: Secondary | ICD-10-CM

## 2011-12-07 DIAGNOSIS — Z95810 Presence of automatic (implantable) cardiac defibrillator: Secondary | ICD-10-CM

## 2011-12-07 DIAGNOSIS — I2510 Atherosclerotic heart disease of native coronary artery without angina pectoris: Secondary | ICD-10-CM

## 2011-12-07 DIAGNOSIS — F17200 Nicotine dependence, unspecified, uncomplicated: Secondary | ICD-10-CM

## 2011-12-07 DIAGNOSIS — N189 Chronic kidney disease, unspecified: Secondary | ICD-10-CM

## 2011-12-07 DIAGNOSIS — R0602 Shortness of breath: Secondary | ICD-10-CM

## 2011-12-07 DIAGNOSIS — E785 Hyperlipidemia, unspecified: Secondary | ICD-10-CM

## 2011-12-07 DIAGNOSIS — I10 Essential (primary) hypertension: Secondary | ICD-10-CM

## 2011-12-07 DIAGNOSIS — R42 Dizziness and giddiness: Secondary | ICD-10-CM

## 2011-12-07 LAB — ICD DEVICE OBSERVATION
BMOD-0002RV: 10
DEVICE MODEL ICD: 726698
HV IMPEDENCE: 49 Ohm
RV LEAD AMPLITUDE: 12 mv
RV LEAD IMPEDENCE ICD: 330 Ohm
RV LEAD THRESHOLD: 0.75 V
TZAT-0004FASTVT: 8
TZAT-0004SLOWVT: 8
TZAT-0012FASTVT: 200 ms
TZAT-0012SLOWVT: 200 ms
TZAT-0013FASTVT: 1
TZAT-0018FASTVT: NEGATIVE
TZAT-0018SLOWVT: NEGATIVE
TZON-0003FASTVT: 280 ms
TZON-0003SLOWVT: 335 ms
TZON-0010SLOWVT: 80 ms
TZST-0001FASTVT: 2
TZST-0001FASTVT: 3
TZST-0001SLOWVT: 3
TZST-0003FASTVT: 36 J
TZST-0003FASTVT: 40 J
TZST-0003FASTVT: 40 J
TZST-0003SLOWVT: 15 J
TZST-0003SLOWVT: 36 J

## 2011-12-07 LAB — BASIC METABOLIC PANEL
BUN: 18 mg/dL (ref 6–23)
Calcium: 9.8 mg/dL (ref 8.4–10.5)
GFR: 41.8 mL/min — ABNORMAL LOW (ref >60.00)
Glucose, Bld: 95 mg/dL (ref 70–99)
Potassium: 4.4 mEq/L (ref 3.5–5.1)
Sodium: 138 mEq/L (ref 135–145)

## 2011-12-07 LAB — BRAIN NATRIURETIC PEPTIDE: Pro B Natriuretic peptide (BNP): 225 pg/mL — ABNORMAL HIGH (ref 0.0–100.0)

## 2011-12-07 MED ORDER — FUROSEMIDE 40 MG PO TABS: 60.0000 mg | ORAL_TABLET | Freq: Two times a day (BID) | ORAL | Status: DC

## 2011-12-07 MED ORDER — NITROGLYCERIN 0.4 MG SL SUBL: 0.4000 mg | SUBLINGUAL_TABLET | SUBLINGUAL | Status: DC | PRN

## 2011-12-07 NOTE — Progress Notes (Signed)
[address] [place], [place]  [address] Phone: [phone] Fax:  [phone]  Date:  12/07/2011   Name:  [name]       DOB:  09/20/1945 MRN:  [id number]  PCP:  Dr. Liane Comber  Primary Cardiologist:  Dr. Lewayne Bunting  Primary Electrophysiologist:  Dr. Lewayne Bunting    History of Present Illness: [name] is a 66 y.o. female who presents for post hospital follow up.  She has a history CAD, status post prior stenting to the proximal RCA, status post anterior STEMI 10/2008 (RCA occluded at the prior stent, LAD treated with a bare metal stent and a drug-eluting stent), ischemic cardiomyopathy, EF 25%, systolic heart failure, PAD, status post prior femoral-popliteal bypass, hypertension, hyperlipidemia, GERD, restless leg syndrome and depression.  She is status post AICD.  She was last seen by Dr. Ladona Ridgel 01/2011.  She was admitted 3/7-3/9.  It appears that she presented with worsening dizziness and a UTI.  Head CT demonstrated no acute abnormalities but was remarkable for cerebellar and cerebral atrophy, chronic microvascular ischemic changes in the deep and periventricular white matter of the cerebral hemispheres bilaterally and old basal ganglia lacunar infarct.  She was referred to outpatient PT.  Her BNP was noted to be significantly elevated and she had symptoms of orthopnea.  Follow up echo demonstrated stable EF at 25-30%.  Her Lasix was increased.  She was asked to follow up here today.    The patient notes a persistent cough over the last several weeks.  This seems to be worse when she lays flat.  She does note orthopnea.  She denies PND or pedal edema.  She does note some increased abdominal girth.  She's been following her weight since discharge.  She is up about 3-4 pounds in the last week.  She does admit to some dietary indiscretion with salt.  She describes chronic class III dyspnea with exertion.  She has chronic substernal chest pain.  She has  experienced that since her myocardial infarction and it has not changed.  She denies syncope.  She does note difficulty with balance and states that she falls frequently.  Past Medical History  Diagnosis Date  . PVD (peripheral vascular disease)     status post prior femoral-popliteal bypass  . Back pain   . Arthritis   . CAD (coronary artery disease)     status post prior stenting to the proximal RCA, status post anterior STEMI 10/2008 (RCA occluded at the prior stent, LAD treated with a bare metal stent and a drug-eluting stent),  . Chronic systolic heart failure     echo 11/2011: EF 25-30%, AS and apical AK, trivial AI, mild MR, PASP 34.  . Anemia   . Ischemic cardiomyopathy   . AICD (automatic cardioverter/defibrillator) present   . HLD (hyperlipidemia)   . GERD (gastroesophageal reflux disease)   . RLS (restless legs syndrome)   . Depression   . Tobacco abuse     Current Outpatient Prescriptions  Medication Sig Dispense Refill  . allopurinol (ZYLOPRIM) 100 MG tablet Take 100 mg by mouth daily.        Marland Kitchen aspirin 81 MG tablet Take 81 mg by mouth daily.        . Cholecalciferol (VITAMIN D) 1000 UNITS capsule Take 1,000 Units by mouth daily.        . clonazePAM (KLONOPIN) 1 MG tablet Take 0.5-2 mg by mouth 2 (two) times daily as needed. Take  1 tablet in the morning and 2 tablets in the evening      . clopidogrel (PLAVIX) 75 MG tablet Take 1 tablet (75 mg total) by mouth daily.  30 tablet  11  . estrogens, conjugated, (PREMARIN) 0.625 MG tablet Take 0.625 mg by mouth daily. Take daily for 21 days then do not take for 7 days.       Marland Kitchen ezetimibe (ZETIA) 10 MG tablet Take 10 mg by mouth daily.        . ferrous sulfate 325 (65 FE) MG tablet Take 325 mg by mouth daily with breakfast.        . fish oil-omega-3 fatty acids 1000 MG capsule Take 2 g by mouth daily.      . furosemide (LASIX) 40 MG tablet Take 1.5 tablets (60 mg total) by mouth 2 (two) times daily.  90 tablet  6  .  HYDROcodone-acetaminophen (VICODIN) 5-500 MG per tablet Take 1 tablet by mouth as directed.        . metoprolol (TOPROL-XL) 50 MG 24 hr tablet Take 1 tablet (50 mg total) by mouth daily.  30 tablet  11  . nitroGLYCERIN (NITROSTAT) 0.4 MG SL tablet Place 1 tablet (0.4 mg total) under the tongue every 5 (five) minutes as needed. For chest pain  25 tablet  2  . pantoprazole (PROTONIX) 40 MG tablet Take 40 mg by mouth daily.        . potassium chloride SA (K-DUR,KLOR-CON) 20 MEQ tablet Take 1 tablet (20 mEq total) by mouth daily.  30 tablet  11  . ramipril (ALTACE) 2.5 MG capsule TAKE 1 CAPSULE EVERY DAY  30 capsule  8  . rosuvastatin (CRESTOR) 20 MG tablet Take 1 tablet (20 mg total) by mouth daily.  30 tablet  9  . Skin Protectants, Misc. (EUCERIN) cream Apply 1 application topically 2 (two) times daily as needed.      . zolpidem (AMBIEN) 10 MG tablet Take 10 mg by mouth at bedtime as needed.        Marland Kitchen DISCONTD: furosemide (LASIX) 40 MG tablet Take 1 tablet (40 mg total) by mouth 2 (two) times daily.  60 tablet  0  . DISCONTD: nitroGLYCERIN (NITROSTAT) 0.4 MG SL tablet Place 0.4 mg under the tongue every 5 (five) minutes as needed. For chest pain        Allergies: Allergies  Allergen Reactions  . Morphine     REACTION: makes me crazy    History  Substance Use Topics  . Smoking status: Current Everyday Smoker -- 1.0 packs/day for 55 years    Types: Cigarettes  . Smokeless tobacco: Never Used  . Alcohol Use: No     ROS:  Please see the history of present illness.    All other systems reviewed and negative.   PHYSICAL EXAM: VS:  BP 108/68  Pulse 51  Ht 5\' 3"  (1.6 m)  Wt 129 lb (58.514 kg)  BMI 22.85 kg/m2 Well nourished, well developed, in no acute distress HEENT: normal Neck: JVP 7-8 cm Cardiac:  normal S1, S2; RRR; no murmur Lungs:  clear to auscultation bilaterally, no wheezing, rhonchi or rales Abd: soft, nontender, ? Hepatomegaly with RUQ tenderness to palpation Ext: no  edema Skin: warm and dry Neuro:  CNs 2-12 intact, no focal abnormalities noted  EKG:  Sinus bradycardia, heart rate 51, left axis deviation, T wave inversions in 1, 2, V3-V6, no change from prior tracing dated 04/2009  ASSESSMENT AND PLAN:  1.  Chronic systolic heart failure  I believe that she is still volume overloaded.  Her neck veins are up.  She probably has some hepatic congestion as well based upon my exam.  Check a basic metabolic panel today.  Check a BNP.  Repeat both of these in one week.  Increase Lasix to 60 mg twice a day.  Continue Toprol and Altace.  Consider spironolactone if renal fxn will allow.  She has a followup with Dr. Ladona Ridgel in May.  I can see her back in 3-4 weeks.   2. Coronary atherosclerosis of native coronary artery  No angina.  Continue aspirin and Plavix.     3. DIZZINESS  I suspect that this is related to her chronic cerebellar atrophy as well as her medications.  She is currently undergoing evaluation with physical therapy.  Her device was interrogated today.  She has no recent history of VT.   5. CKD (chronic kidney disease)  I will keep a close eye on her renal function potassium with adjustments of Lasix.   6. AUTOMATIC IMPLANTABLE CARDIAC DEFIBRILLATOR SITU  Device interrogated today.  Followup with Dr. Ladona Ridgel as planned.   7. TOBACCO ABUSE  She is not interested in quitting.   8. HYPERLIPIDEMIA  Continue Crestor.   9. HYPERTENSION  Controlled.       Signed, Tereso Newcomer, PA-C  1:40 PM 12/07/2011

## 2011-12-07 NOTE — Patient Instructions (Signed)
Your physician recommends that you schedule a follow-up appointment in: 3-4 WEEKS WITH SCOTT WEAVER, PA-C SAME DAY DR. Ladona Ridgel IS IN THE OFFICE  Your physician recommends that you return for lab work in: TODAY BMET, BNP  Your physician recommends that you return for lab work in: 12/13/11 REPEAT BMET, BNP  Your physician has recommended you make the following change in your medication: INCREASE LASIX TO 60 MG TWICE DAILY

## 2011-12-10 ENCOUNTER — Telehealth: Payer: Self-pay | Admitting: *Deleted

## 2011-12-10 NOTE — Telephone Encounter (Signed)
lmom labs stable, repeat bmet 12/13/11. Danielle Rankin

## 2011-12-10 NOTE — Telephone Encounter (Signed)
Message copied by Tarri Fuller on Mon Dec 10, 2011 11:10 AM ------      Message from: [place], [place] T      Created: Mon Dec 10, 2011 10:42 AM       Renal fxn stable      Make sure she has repeat bmet scheduled this week      Tereso Newcomer, PA-C  10:42 AM 12/10/2011

## 2011-12-11 ENCOUNTER — Encounter: Payer: Medicare Other | Admitting: Physical Therapy

## 2011-12-12 ENCOUNTER — Ambulatory Visit: Payer: Medicare Other | Admitting: Physical Therapy

## 2011-12-13 ENCOUNTER — Telehealth: Payer: Self-pay | Admitting: *Deleted

## 2011-12-13 ENCOUNTER — Other Ambulatory Visit (INDEPENDENT_AMBULATORY_CARE_PROVIDER_SITE_OTHER): Payer: Medicare Other

## 2011-12-13 DIAGNOSIS — I5022 Chronic systolic (congestive) heart failure: Secondary | ICD-10-CM

## 2011-12-13 DIAGNOSIS — R0602 Shortness of breath: Secondary | ICD-10-CM

## 2011-12-13 DIAGNOSIS — I2510 Atherosclerotic heart disease of native coronary artery without angina pectoris: Secondary | ICD-10-CM

## 2011-12-13 LAB — BASIC METABOLIC PANEL
GFR: 42.52 mL/min — ABNORMAL LOW (ref >60.00)
Potassium: 3.8 mEq/L (ref 3.5–5.1)
Sodium: 140 mEq/L (ref 135–145)

## 2011-12-13 LAB — BRAIN NATRIURETIC PEPTIDE: Pro B Natriuretic peptide (BNP): 192 pg/mL — ABNORMAL HIGH (ref 0.0–100.0)

## 2011-12-13 NOTE — Telephone Encounter (Signed)
Message copied by Tarri Fuller on Thu Dec 13, 2011  5:28 PM ------      Message from: [place], [place] T      Created: Thu Dec 13, 2011  1:25 PM       Stable      [place], [place]  1:24 PM 12/13/2011

## 2011-12-13 NOTE — Telephone Encounter (Signed)
no answer wcb on monday with lab results. [name]

## 2011-12-14 ENCOUNTER — Telehealth: Payer: Self-pay | Admitting: *Deleted

## 2011-12-14 NOTE — Telephone Encounter (Signed)
line busy, I wcb on monday to give pt her lab results. [name]

## 2011-12-14 NOTE — Telephone Encounter (Signed)
Message copied by Tarri Fuller on Fri Dec 14, 2011 12:00 PM ------      Message from: [place], [place] T      Created: Thu Dec 13, 2011  1:25 PM       Stable      [place], [place]  1:24 PM 12/13/2011

## 2011-12-17 NOTE — Telephone Encounter (Signed)
s/w pt's daughter today and she has been notified of lab results stable. Pt's daughter states pt was told when she left the hospital she was supposed to weigh daily and if > 3lb x 1 day then needs to call cardiology office, daughter asked me was pt supposed to still weigh daily. I advised yes, that we will need to know when her pt's weight has increased due to pt has heart failure. Daughter gave me verbal understanding today. Danielle Rankin

## 2011-12-18 ENCOUNTER — Ambulatory Visit: Payer: Medicare Other | Attending: Internal Medicine | Admitting: Physical Therapy

## 2011-12-18 DIAGNOSIS — R269 Unspecified abnormalities of gait and mobility: Secondary | ICD-10-CM | POA: Insufficient documentation

## 2011-12-18 DIAGNOSIS — IMO0001 Reserved for inherently not codable concepts without codable children: Secondary | ICD-10-CM | POA: Insufficient documentation

## 2011-12-20 ENCOUNTER — Encounter: Payer: Medicare Other | Admitting: Physical Therapy

## 2011-12-31 ENCOUNTER — Ambulatory Visit: Payer: Medicare Other | Admitting: Physical Therapy

## 2012-01-02 ENCOUNTER — Encounter: Payer: Medicare Other | Admitting: Physical Therapy

## 2012-01-02 ENCOUNTER — Ambulatory Visit (INDEPENDENT_AMBULATORY_CARE_PROVIDER_SITE_OTHER): Payer: Medicare Other | Admitting: Physician Assistant

## 2012-01-02 ENCOUNTER — Encounter: Payer: Self-pay | Admitting: Physician Assistant

## 2012-01-02 VITALS — BP 108/68 | HR 56 | Ht 63.0 in | Wt 122.4 lb

## 2012-01-02 DIAGNOSIS — N189 Chronic kidney disease, unspecified: Secondary | ICD-10-CM

## 2012-01-02 DIAGNOSIS — I5022 Chronic systolic (congestive) heart failure: Secondary | ICD-10-CM

## 2012-01-02 DIAGNOSIS — I10 Essential (primary) hypertension: Secondary | ICD-10-CM

## 2012-01-02 DIAGNOSIS — I2510 Atherosclerotic heart disease of native coronary artery without angina pectoris: Secondary | ICD-10-CM

## 2012-01-02 LAB — BASIC METABOLIC PANEL
Chloride: 103 mEq/L (ref 96–112)
Potassium: 5.1 mEq/L (ref 3.5–5.1)

## 2012-01-02 NOTE — Progress Notes (Signed)
[address] [place], [place]  [address] Phone: [phone] Fax:  [phone]  Date:  01/02/2012   Name:  [name]       DOB:  07-Oct-1945 MRN:  [id number]  PCP:  Dr. Liane Comber  Primary Cardiologist:  Dr. Lewayne Bunting  Primary Electrophysiologist:  Dr. Lewayne Bunting    History of Present Illness: [name] is a 66 y.o. female who presents for follow up.  She has a history CAD, status post prior stenting to the proximal RCA, status post anterior STEMI 10/2008 (RCA occluded at the prior stent, LAD treated with a bare metal stent and a drug-eluting stent), ischemic cardiomyopathy, EF 25%, systolic heart failure, PAD, status post prior femoral-popliteal bypass, hypertension, hyperlipidemia, GERD, restless leg syndrome and depression.  She is status post AICD.    Admitted 11/2011 with worsening dizziness.  A BNP was elevated and f/u echo demonstrated stable EF at 25-30%.  Her Lasix was increased.  I saw her in follow up 3/22.  Her weight was still up and she appeared to still have some volume overload.  I increased her Lasix.  I brought her back today for follow up.  Labs (3/13):  K 4.4 => 3.8; creatinine 1.4 => 1.3  She is feeling much better.  Notes class 2 symptoms.  No orthopnea, PND or edema.  No anginal symptoms.  No syncope.  Still smoking.    Past Medical History  Diagnosis Date  . PVD (peripheral vascular disease)     status post prior femoral-popliteal bypass  . Back pain   . Arthritis   . CAD (coronary artery disease)     status post prior stenting to the proximal RCA, status post anterior STEMI 10/2008 (RCA occluded at the prior stent, LAD treated with a bare metal stent and a drug-eluting stent),  . Chronic systolic heart failure     echo 11/2011: EF 25-30%, AS and apical AK, trivial AI, mild MR, PASP 34.  . Anemia   . Ischemic cardiomyopathy   . AICD (automatic cardioverter/defibrillator) present   . HLD (hyperlipidemia)   . GERD  (gastroesophageal reflux disease)   . RLS (restless legs syndrome)   . Depression   . Tobacco abuse     Current Outpatient Prescriptions  Medication Sig Dispense Refill  . allopurinol (ZYLOPRIM) 100 MG tablet Take 100 mg by mouth daily.        Marland Kitchen aspirin 81 MG tablet Take 81 mg by mouth daily.        . Cholecalciferol (VITAMIN D) 1000 UNITS capsule Take 1,000 Units by mouth daily.        . clonazePAM (KLONOPIN) 1 MG tablet Take 0.5-2 mg by mouth 2 (two) times daily as needed. Take 1 tablet in the morning and 2 tablets in the evening      . clopidogrel (PLAVIX) 75 MG tablet Take 1 tablet (75 mg total) by mouth daily.  30 tablet  11  . estrogens, conjugated, (PREMARIN) 0.625 MG tablet Take 0.625 mg by mouth daily. Take daily for 21 days then do not take for 7 days.       Marland Kitchen ezetimibe (ZETIA) 10 MG tablet Take 10 mg by mouth daily.        . ferrous sulfate 325 (65 FE) MG tablet Take 325 mg by mouth daily with breakfast.        . fish oil-omega-3 fatty acids 1000 MG capsule Take 2 g by mouth daily.      Marland Kitchen  furosemide (LASIX) 40 MG tablet Take 1.5 tablets (60 mg total) by mouth 2 (two) times daily.  90 tablet  6  . HYDROcodone-acetaminophen (VICODIN) 5-500 MG per tablet Take 1 tablet by mouth as directed.        . metoprolol (TOPROL-XL) 50 MG 24 hr tablet Take 1 tablet (50 mg total) by mouth daily.  30 tablet  11  . nitroGLYCERIN (NITROSTAT) 0.4 MG SL tablet Place 1 tablet (0.4 mg total) under the tongue every 5 (five) minutes as needed. For chest pain  25 tablet  2  . pantoprazole (PROTONIX) 40 MG tablet Take 40 mg by mouth daily.        . potassium chloride SA (K-DUR,KLOR-CON) 20 MEQ tablet Take 1 tablet (20 mEq total) by mouth daily.  30 tablet  11  . ramipril (ALTACE) 2.5 MG capsule TAKE 1 CAPSULE EVERY DAY  30 capsule  8  . rosuvastatin (CRESTOR) 20 MG tablet Take 1 tablet (20 mg total) by mouth daily.  30 tablet  9  . Skin Protectants, Misc. (EUCERIN) cream Apply 1 application topically 2  (two) times daily as needed.      . zolpidem (AMBIEN) 10 MG tablet Take 10 mg by mouth at bedtime as needed.        Marland Kitchen DISCONTD: mirtazapine (REMERON) 15 MG tablet Take 15 mg by mouth as needed.        Marland Kitchen DISCONTD: ranitidine (ZANTAC) 300 MG tablet Take 300 mg by mouth 2 (two) times daily.          Allergies: Allergies  Allergen Reactions  . Morphine     REACTION: makes me crazy    History  Substance Use Topics  . Smoking status: Current Everyday Smoker -- 1.0 packs/day for 55 years    Types: Cigarettes  . Smokeless tobacco: Never Used  . Alcohol Use: No     PHYSICAL EXAM: VS:  BP 108/68  Pulse 56  Ht 5\' 3"  (1.6 m)  Wt 122 lb 6.4 oz (55.52 kg)  BMI 21.68 kg/m2 Well nourished, well developed, in no acute distress HEENT: normal Neck: no JVD Cardiac:  normal S1, S2; RRR; no murmur Lungs:  clear to auscultation bilaterally, no wheezing, rhonchi or rales Abd: soft, nontender, no hepatomegaly Ext: no edema Skin: warm and dry Neuro:  CNs 2-12 intact, no focal abnormalities noted  EKG:  Sinus bradycardia, heart rate 56, normal axis, inferolateral T-wave inversions, no change prior tracing  ASSESSMENT AND PLAN:  1. Chronic systolic heart failure  Volume improved.  Continue current Rx.  Check BMET today.  Follow up with Dr. Lewayne Bunting as scheduled.   2. HYPERTENSION  Controlled.  Continue current therapy.    3. CKD (chronic kidney disease)  Repeat BMET today.   4. CORONARY ARTERY DISEASE  No angina.  Continue ASA and statin.  Follow up with Dr. Ladona Ridgel in May.      SignedTereso Newcomer, PA-C  11:34 AM 01/02/2012

## 2012-01-02 NOTE — Patient Instructions (Signed)
Your physician recommends that you schedule a follow-up appointment in: 02/05/12 @ 9:30 am to see Dr. Ladona Ridgel  Your physician recommends that you return for lab work in: TODAY BMET

## 2012-01-04 ENCOUNTER — Telehealth: Payer: Self-pay | Admitting: *Deleted

## 2012-01-04 DIAGNOSIS — I2510 Atherosclerotic heart disease of native coronary artery without angina pectoris: Secondary | ICD-10-CM

## 2012-01-04 DIAGNOSIS — I5022 Chronic systolic (congestive) heart failure: Secondary | ICD-10-CM

## 2012-01-04 DIAGNOSIS — Z95810 Presence of automatic (implantable) cardiac defibrillator: Secondary | ICD-10-CM

## 2012-01-04 DIAGNOSIS — I10 Essential (primary) hypertension: Secondary | ICD-10-CM

## 2012-01-04 DIAGNOSIS — I428 Other cardiomyopathies: Secondary | ICD-10-CM

## 2012-01-04 NOTE — Telephone Encounter (Signed)
lmom for ptcb to go over lab results and med dose changes and repeat bmet 1 week.  brother cb and took down the med changes and bmet 4/26 and to call if weight is >3lb sob, edema

## 2012-01-04 NOTE — Telephone Encounter (Signed)
ptcb to go over lab results herself . pt aware of med changes and repeat lab to be done now 4/25 instead of 4/26. [name]

## 2012-01-10 ENCOUNTER — Other Ambulatory Visit (INDEPENDENT_AMBULATORY_CARE_PROVIDER_SITE_OTHER): Payer: Medicare Other

## 2012-01-10 DIAGNOSIS — I10 Essential (primary) hypertension: Secondary | ICD-10-CM

## 2012-01-10 LAB — BASIC METABOLIC PANEL
CO2: 27 mEq/L (ref 19–32)
Chloride: 105 mEq/L (ref 96–112)
GFR: 36.72 mL/min — ABNORMAL LOW (ref >60.00)
Glucose, Bld: 102 mg/dL — ABNORMAL HIGH (ref 70–99)
Potassium: 3.8 mEq/L (ref 3.5–5.1)
Sodium: 142 mEq/L (ref 135–145)

## 2012-01-11 ENCOUNTER — Other Ambulatory Visit: Payer: Medicare Other

## 2012-01-23 ENCOUNTER — Other Ambulatory Visit: Payer: Self-pay | Admitting: Internal Medicine

## 2012-02-05 ENCOUNTER — Ambulatory Visit (INDEPENDENT_AMBULATORY_CARE_PROVIDER_SITE_OTHER): Payer: Medicare Other | Admitting: Internal Medicine

## 2012-02-05 ENCOUNTER — Encounter: Payer: Self-pay | Admitting: Internal Medicine

## 2012-02-05 VITALS — BP 110/70 | HR 60 | Ht 64.0 in | Wt 126.8 lb

## 2012-02-05 DIAGNOSIS — Z95810 Presence of automatic (implantable) cardiac defibrillator: Secondary | ICD-10-CM

## 2012-02-05 DIAGNOSIS — I5022 Chronic systolic (congestive) heart failure: Secondary | ICD-10-CM

## 2012-02-05 DIAGNOSIS — I2510 Atherosclerotic heart disease of native coronary artery without angina pectoris: Secondary | ICD-10-CM

## 2012-02-05 DIAGNOSIS — R0602 Shortness of breath: Secondary | ICD-10-CM

## 2012-02-05 LAB — ICD DEVICE OBSERVATION
DEV-0020ICD: NEGATIVE
HV IMPEDENCE: 51 Ohm
RV LEAD IMPEDENCE ICD: 325 Ohm
TOT-0009: 1
TOT-0010: 8
TZAT-0001FASTVT: 1
TZAT-0004FASTVT: 8
TZAT-0018FASTVT: NEGATIVE
TZAT-0018SLOWVT: NEGATIVE
TZAT-0020SLOWVT: 1 ms
TZON-0003FASTVT: 280 ms
TZON-0005SLOWVT: 6
TZON-0010FASTVT: 80 ms
TZON-0010SLOWVT: 80 ms
TZST-0001FASTVT: 3
TZST-0001FASTVT: 5
TZST-0001SLOWVT: 2
TZST-0001SLOWVT: 4
TZST-0003FASTVT: 20 J
TZST-0003FASTVT: 40 J
VENTRICULAR PACING ICD: 0.06 pct

## 2012-02-05 NOTE — Assessment & Plan Note (Signed)
Her device is working normally. We'll plan to recheck in several months. No programming changes made today.

## 2012-02-05 NOTE — Assessment & Plan Note (Signed)
She has chronic systolic heart failure which appears to be well compensated. She will continue her current medical therapy and maintain a low-sodium diet.

## 2012-02-05 NOTE — Assessment & Plan Note (Signed)
She denies anginal symptoms. She will continue her current medical therapy. 

## 2012-02-05 NOTE — Progress Notes (Signed)
HPI [name] returns today for followup. She is a 66 year old woman with an ischemic cardiomyopathy, chronic systolic heart failure, status post ICD implantation. In the interim, she denies chest pain or shortness of breath.. No recent ICD shocks. She denies any syncope. She does note peripheral edema and is on diuretics. She admits to some sodium indiscretion. Allergies  Allergen Reactions  . Morphine     REACTION: makes me crazy     Current Outpatient Prescriptions  Medication Sig Dispense Refill  . allopurinol (ZYLOPRIM) 100 MG tablet Take 100 mg by mouth daily.        Marland Kitchen aspirin 81 MG tablet Take 81 mg by mouth daily.        . Cholecalciferol (VITAMIN D) 1000 UNITS capsule Take 1,000 Units by mouth daily.        . citalopram (CELEXA) 40 MG tablet Take 40 mg by mouth daily.      . clonazePAM (KLONOPIN) 1 MG tablet Take 0.5-2 mg by mouth 2 (two) times daily as needed. Take 1 tablet in the morning and 2 tablets in the evening      . clopidogrel (PLAVIX) 75 MG tablet Take 1 tablet (75 mg total) by mouth daily.  30 tablet  11  . estrogens, conjugated, (PREMARIN) 0.625 MG tablet Take 0.625 mg by mouth daily. Take daily for 21 days then do not take for 7 days.       Marland Kitchen ezetimibe (ZETIA) 10 MG tablet Take 10 mg by mouth daily.        . ferrous sulfate 325 (65 FE) MG tablet Take 325 mg by mouth daily with breakfast.        . fish oil-omega-3 fatty acids 1000 MG capsule Take 2 g by mouth daily.      . furosemide (LASIX) 40 MG tablet TAKE 60 mg (1 and 1/2 tabs) in the morning and take 40 mg (1 tab) in the evening      . HYDROcodone-acetaminophen (VICODIN) 5-500 MG per tablet Take 1 tablet by mouth as directed.        . metoprolol (TOPROL-XL) 50 MG 24 hr tablet Take 1 tablet (50 mg total) by mouth daily.  30 tablet  11  . nitroGLYCERIN (NITROSTAT) 0.4 MG SL tablet Place 1 tablet (0.4 mg total) under the tongue every 5 (five) minutes as needed. For chest pain  25 tablet  2  . pantoprazole (PROTONIX)  40 MG tablet Take 40 mg by mouth daily.        . potassium chloride SA (K-DUR,KLOR-CON) 20 MEQ tablet TAKE 1 TABLET ONLY ON MONDAYS, WEDS, FRI'S      . ramipril (ALTACE) 2.5 MG capsule TAKE 1 CAPSULE EVERY DAY  30 capsule  8  . rosuvastatin (CRESTOR) 20 MG tablet Take 1 tablet (20 mg total) by mouth daily.  30 tablet  9  . Skin Protectants, Misc. (EUCERIN) cream Apply 1 application topically 2 (two) times daily as needed.      . zolpidem (AMBIEN) 10 MG tablet Take 10 mg by mouth at bedtime as needed.        Marland Kitchen DISCONTD: mirtazapine (REMERON) 15 MG tablet Take 15 mg by mouth as needed.        Marland Kitchen DISCONTD: ranitidine (ZANTAC) 300 MG tablet Take 300 mg by mouth 2 (two) times daily.           Past Medical History  Diagnosis Date  . PVD (peripheral vascular disease)     status post prior  femoral-popliteal bypass  . Back pain   . Arthritis   . CAD (coronary artery disease)     status post prior stenting to the proximal RCA, status post anterior STEMI 10/2008 (RCA occluded at the prior stent, LAD treated with a bare metal stent and a drug-eluting stent),  . Chronic systolic heart failure     echo 11/2011: EF 25-30%, AS and apical AK, trivial AI, mild MR, PASP 34.  . Anemia   . Ischemic cardiomyopathy   . AICD (automatic cardioverter/defibrillator) present   . HLD (hyperlipidemia)   . GERD (gastroesophageal reflux disease)   . RLS (restless legs syndrome)   . Depression   . Tobacco abuse     ROS:   All systems reviewed and negative except as noted in the HPI.   Past Surgical History  Procedure Date  . Visual merchandiser   . Foot surgery   . Pr vein bypass graft,aorto-fem-pop     fem fem  04/07/09  . Abdominal hysterectomy   . Cardiac defibrillator placement      Family History  Problem Relation Age of Onset  . Peripheral vascular disease Sister   . Heart disease Brother      History   Social History  . Marital Status: Legally Separated    Spouse Name: N/A    Number of Children:  N/A  . Years of Education: N/A   Occupational History  . Not on file.   Social History Main Topics  . Smoking status: Current Everyday Smoker -- 1.0 packs/day for 55 years    Types: Cigarettes  . Smokeless tobacco: Never Used  . Alcohol Use: No  . Drug Use: No  . Sexually Active: Not on file   Other Topics Concern  . Not on file   Social History Narrative  . No narrative on file     BP 110/70  Pulse 60  Ht 5\' 4"  (1.626 m)  Wt 126 lb 12.8 oz (57.516 kg)  BMI 21.77 kg/m2  Physical Exam:  Well appearing 67 year old woman, NAD HEENT: Unremarkable Neck:  7 cm JVD, no thyromegally Lungs:  Clear except for rales in the bases bilaterally. HEART:  Regular rate rhythm, no rubs, no clicks soft systolic murmur is present at the base. Abd:  soft, positive bowel sounds, no organomegally, no rebound, no guarding Ext:  2 plus pulses, no edema, no cyanosis, no clubbing Skin:  No rashes no nodules Neuro:  CN II through XII intact, motor grossly intact  DEVICE  Normal device function.  See PaceArt for details.   Assess/Plan:

## 2012-02-05 NOTE — Patient Instructions (Signed)
Your physician wants you to follow-up in: 12 months with Dr Allred You will receive a reminder letter in the mail two months in advance. If you don't receive a letter, please call our office to schedule the follow-up appointment.  

## 2012-02-18 ENCOUNTER — Other Ambulatory Visit: Payer: Self-pay | Admitting: Cardiology

## 2012-02-18 DIAGNOSIS — I2510 Atherosclerotic heart disease of native coronary artery without angina pectoris: Secondary | ICD-10-CM

## 2012-02-18 MED ORDER — METOPROLOL SUCCINATE ER 50 MG PO TB24: 50.0000 mg | ORAL_TABLET | Freq: Every day | ORAL | Status: DC

## 2012-02-22 ENCOUNTER — Other Ambulatory Visit: Payer: Self-pay | Admitting: *Deleted

## 2012-02-22 ENCOUNTER — Other Ambulatory Visit: Payer: Self-pay | Admitting: Internal Medicine

## 2012-02-22 DIAGNOSIS — I5022 Chronic systolic (congestive) heart failure: Secondary | ICD-10-CM

## 2012-02-22 DIAGNOSIS — Z95810 Presence of automatic (implantable) cardiac defibrillator: Secondary | ICD-10-CM

## 2012-02-22 DIAGNOSIS — I2510 Atherosclerotic heart disease of native coronary artery without angina pectoris: Secondary | ICD-10-CM

## 2012-02-22 DIAGNOSIS — I428 Other cardiomyopathies: Secondary | ICD-10-CM

## 2012-02-22 MED ORDER — FUROSEMIDE 40 MG PO TABS: ORAL_TABLET | ORAL | Status: DC

## 2012-02-22 MED ORDER — POTASSIUM CHLORIDE CRYS ER 20 MEQ PO TBCR: EXTENDED_RELEASE_TABLET | ORAL | Status: DC

## 2012-02-22 MED ORDER — RAMIPRIL 2.5 MG PO CAPS: 2.5000 mg | ORAL_CAPSULE | Freq: Every day | ORAL | Status: DC

## 2012-02-22 MED ORDER — METOPROLOL SUCCINATE ER 50 MG PO TB24: 50.0000 mg | ORAL_TABLET | Freq: Every day | ORAL | Status: DC

## 2012-02-22 MED ORDER — CLOPIDOGREL BISULFATE 75 MG PO TABS: 75.0000 mg | ORAL_TABLET | Freq: Every day | ORAL | Status: DC

## 2012-02-22 NOTE — Telephone Encounter (Signed)
Refilled 90 day supply of Clopidogrel,Klor-con and Metoprolol.

## 2012-02-22 NOTE — Telephone Encounter (Signed)
Refilled Ramipril

## 2012-03-10 ENCOUNTER — Other Ambulatory Visit: Payer: Self-pay | Admitting: Internal Medicine

## 2012-04-22 ENCOUNTER — Other Ambulatory Visit: Payer: Self-pay | Admitting: *Deleted

## 2012-04-22 DIAGNOSIS — I739 Peripheral vascular disease, unspecified: Secondary | ICD-10-CM

## 2012-04-22 DIAGNOSIS — Z48812 Encounter for surgical aftercare following surgery on the circulatory system: Secondary | ICD-10-CM

## 2012-04-22 DIAGNOSIS — I70219 Atherosclerosis of native arteries of extremities with intermittent claudication, unspecified extremity: Secondary | ICD-10-CM

## 2012-04-28 ENCOUNTER — Encounter: Payer: Self-pay | Admitting: Neurosurgery

## 2012-04-29 ENCOUNTER — Ambulatory Visit (INDEPENDENT_AMBULATORY_CARE_PROVIDER_SITE_OTHER): Payer: Medicare Other | Admitting: Neurosurgery

## 2012-04-29 ENCOUNTER — Encounter (INDEPENDENT_AMBULATORY_CARE_PROVIDER_SITE_OTHER): Payer: Medicare Other | Admitting: *Deleted

## 2012-04-29 ENCOUNTER — Encounter: Payer: Self-pay | Admitting: Neurosurgery

## 2012-04-29 VITALS — BP 115/73 | HR 67 | Resp 18 | Ht 63.0 in | Wt 128.0 lb

## 2012-04-29 DIAGNOSIS — Z48812 Encounter for surgical aftercare following surgery on the circulatory system: Secondary | ICD-10-CM

## 2012-04-29 DIAGNOSIS — I739 Peripheral vascular disease, unspecified: Secondary | ICD-10-CM

## 2012-04-29 DIAGNOSIS — I70219 Atherosclerosis of native arteries of extremities with intermittent claudication, unspecified extremity: Secondary | ICD-10-CM

## 2012-04-29 NOTE — Progress Notes (Addendum)
[facility] [place] PAD/PVD Office Note  CC: Annual PVD surveillance Referring Physician: Hart Rochester  History of Present Illness: 66 year old female patient of Dr. Hart Rochester who is status post a left SFA to PTA graft in January 2002, right femoropopliteal graft in August 2006, left to right fem-fem graft in July 2010. The patient does have complaints of mild claudication in her left lower extremity which is unchanged from previous visit one year ago. The patient states she has mild rest pain but overall is doing well and does not want any further intervention or diagnostics at this time. The patient denies any new medical diagnoses or recent surgery.  Past Medical History  Diagnosis Date  . PVD (peripheral vascular disease)     status post prior femoral-popliteal bypass  . Back pain   . Arthritis   . CAD (coronary artery disease)     status post prior stenting to the proximal RCA, status post anterior STEMI 10/2008 (RCA occluded at the prior stent, LAD treated with a bare metal stent and a drug-eluting stent),  . Chronic systolic heart failure     echo 11/2011: EF 25-30%, AS and apical AK, trivial AI, mild MR, PASP 34.  . Anemia   . Ischemic cardiomyopathy   . AICD (automatic cardioverter/defibrillator) present   . HLD (hyperlipidemia)   . GERD (gastroesophageal reflux disease)   . RLS (restless legs syndrome)   . Depression   . Tobacco abuse   . Pain in joint, lower leg   . Depressive disorder, not elsewhere classified     ROS: [x]  Positive   [ ]  Denies    General: [ ]  Weight loss, [ ]  Fever, [ ]  chills Neurologic: [ ]  Dizziness, [ ]  Blackouts, [ ]  Seizure [ ]  Stroke, [ ]  "Mini stroke", [ ]  Slurred speech, [ ]  Temporary blindness; [x ] weakness in arms or legs, [ ]  Hoarseness Cardiac: [ ]  Chest pain/pressure, [ ]  Shortness of breath at rest [x ] Shortness of breath with exertion, [ ]  Atrial fibrillation or irregular heartbeat Vascular: [ ]  Pain in legs with walking,  [ ]  Pain in legs at rest, [ ]  Pain in legs at night,  [ ]  Non-healing ulcer, [ ]  Blood clot in vein/DVT,   Pulmonary: [ ]  Home oxygen, [ ]  Productive cough, [ ]  Coughing up blood, [ ]  Asthma,  [ ]  Wheezing Musculoskeletal:  [ ]  Arthritis, [ ]  Low back pain, [ ]  Joint pain Hematologic: [ ]  Easy Bruising, [ ]  Anemia; [ ]  Hepatitis Gastrointestinal: [ ]  Blood in stool, [ ]  Gastroesophageal Reflux/heartburn, [ ]  Trouble swallowing Urinary: [ ]  chronic Kidney disease, [ ]  on HD - [ ]  MWF or [ ]  TTHS, [ ]  Burning with urination, [ ]  Difficulty urinating Skin: [ ]  Rashes, [ ]  Wounds Psychological: [ ]  Anxiety, [ ]  Depression   Social History History  Substance Use Topics  . Smoking status: Current Everyday Smoker -- 1.0 packs/day for 55 years    Types: Cigarettes  . Smokeless tobacco: Never Used  . Alcohol Use: No    Family History Family History  Problem Relation Age of Onset  . Peripheral vascular disease Sister   . Heart disease Brother   . Diabetes Mother   . Heart attack Father     Allergies  Allergen Reactions  . Morphine     REACTION: makes me crazy  . Naproxen Other (See Comments)    Blurred vision  . Prednisone  Mean to her grandbabies    Current Outpatient Prescriptions  Medication Sig Dispense Refill  . allopurinol (ZYLOPRIM) 100 MG tablet Take 100 mg by mouth daily.        Marland Kitchen aspirin 81 MG tablet Take 81 mg by mouth daily.        . Cholecalciferol (VITAMIN D) 1000 UNITS capsule Take 1,000 Units by mouth daily.        . citalopram (CELEXA) 40 MG tablet Take 40 mg by mouth daily.      . clonazePAM (KLONOPIN) 1 MG tablet Take 0.5-2 mg by mouth 2 (two) times daily as needed. Take 1 tablet in the morning and 2 tablets in the evening      . clopidogrel (PLAVIX) 75 MG tablet Take 1 tablet (75 mg total) by mouth daily.  90 tablet  3  . clopidogrel (PLAVIX) 75 MG tablet TAKE 1 TABLET BY MOUTH EVERY DAY  30 tablet  6  . estrogens, conjugated, (PREMARIN) 0.625 MG tablet  Take 0.625 mg by mouth daily. Take daily for 21 days then do not take for 7 days.       Marland Kitchen ezetimibe (ZETIA) 10 MG tablet Take 10 mg by mouth daily.        . ferrous sulfate 325 (65 FE) MG tablet Take 325 mg by mouth daily with breakfast.        . fish oil-omega-3 fatty acids 1000 MG capsule Take 2 g by mouth daily.      . furosemide (LASIX) 40 MG tablet TAKE 60 mg (1 and 1/2 tabs) in the morning and take 40 mg (1 tab) in the evening  225 tablet  1  . HYDROcodone-acetaminophen (VICODIN) 5-500 MG per tablet Take 1 tablet by mouth as directed.        . metoprolol succinate (TOPROL-XL) 50 MG 24 hr tablet Take 1 tablet (50 mg total) by mouth daily.  90 tablet  3  . nitroGLYCERIN (NITROSTAT) 0.4 MG SL tablet Place 1 tablet (0.4 mg total) under the tongue every 5 (five) minutes as needed. For chest pain  25 tablet  2  . pantoprazole (PROTONIX) 40 MG tablet Take 40 mg by mouth daily.        . potassium chloride SA (K-DUR,KLOR-CON) 20 MEQ tablet TAKE 1 TABLET ONLY ON MONDAYS, WEDS, FRI'S  90 tablet  3  . ramipril (ALTACE) 2.5 MG capsule Take 1 capsule (2.5 mg total) by mouth daily.  90 capsule  3  . rosuvastatin (CRESTOR) 20 MG tablet Take 1 tablet (20 mg total) by mouth daily.  30 tablet  9  . Skin Protectants, Misc. (EUCERIN) cream Apply 1 application topically 2 (two) times daily as needed.      . zolpidem (AMBIEN) 10 MG tablet Take 10 mg by mouth at bedtime as needed.        Marland Kitchen DISCONTD: mirtazapine (REMERON) 15 MG tablet Take 15 mg by mouth as needed.        Marland Kitchen DISCONTD: ranitidine (ZANTAC) 300 MG tablet Take 300 mg by mouth 2 (two) times daily.          Physical Examination  Filed Vitals:   04/29/12 1440  BP: 115/73  Pulse: 67  Resp: 18    Body mass index is 22.67 kg/(m^2).  General:  WDWN in NAD Gait: Normal HEENT: WNL Eyes: Pupils equal Pulmonary: normal non-labored breathing , without Rales, rhonchi,  wheezing Cardiac: RRR, without  Murmurs, rubs or gallops; No carotid  bruits Abdomen: soft,  NT, no masses Skin: no rashes, ulcers noted Vascular Exam/Pulses: Palpable PT and DP pulses bilaterally, femoral pulses are palpable bilateral  Extremities without ischemic changes, no Gangrene , no cellulitis; no open wounds;  Musculoskeletal: no muscle wasting or atrophy  Neurologic: A&O X 3; Appropriate Affect ; SENSATION: normal; MOTOR FUNCTION:  moving all extremities equally. Speech is fluent/normal  Non-Invasive Vascular Imaging: Duplex graft evaluation shows patent bypass grafts in both lower extremities, ABI today is 0.92 and triphasic on the right, 0.93 and triphasic to biphasic on the left which is consistent with previous exam which was reviewed today.  ASSESSMENT/PLAN: Patient with mild claudication left lower extremity that is declining any further diagnostics or intervention at this time. Dr. Hart Rochester spoke with a patient last year about possible angiography and PTA which again, the patient has declined, and she states she will call if it worsens before her next surveillance appointment. The patient's questions were encouraged and answered, she will followup in one year with repeat duplex graft evaluation and ABIs.  Lauree Chandler ANP  Clinic M.D.: Hart Rochester

## 2012-04-30 NOTE — Addendum Note (Signed)
Addended by: Lorin Mercy K on: 04/30/2012 08:30 AM   Modules accepted: Orders

## 2012-05-08 ENCOUNTER — Ambulatory Visit (INDEPENDENT_AMBULATORY_CARE_PROVIDER_SITE_OTHER): Payer: Medicaid Other | Admitting: *Deleted

## 2012-05-08 ENCOUNTER — Encounter: Payer: Self-pay | Admitting: *Deleted

## 2012-05-08 ENCOUNTER — Encounter: Payer: Self-pay | Admitting: Internal Medicine

## 2012-05-08 DIAGNOSIS — Z95810 Presence of automatic (implantable) cardiac defibrillator: Secondary | ICD-10-CM

## 2012-05-08 DIAGNOSIS — I5022 Chronic systolic (congestive) heart failure: Secondary | ICD-10-CM

## 2012-05-08 LAB — REMOTE ICD DEVICE
BMOD-0002RV: 10
RV LEAD AMPLITUDE: 12 mv
RV LEAD IMPEDENCE ICD: 330 Ohm
TZAT-0001FASTVT: 1
TZAT-0001SLOWVT: 1
TZAT-0012FASTVT: 200 ms
TZAT-0012SLOWVT: 200 ms
TZAT-0013FASTVT: 1
TZAT-0019SLOWVT: 7.5 V
TZAT-0020SLOWVT: 1 ms
TZON-0005SLOWVT: 6
TZON-0010SLOWVT: 80 ms
TZST-0001FASTVT: 2
TZST-0001FASTVT: 5
TZST-0001SLOWVT: 2
TZST-0001SLOWVT: 4
TZST-0003FASTVT: 20 J
TZST-0003FASTVT: 36 J
TZST-0003FASTVT: 40 J
TZST-0003SLOWVT: 15 J
VENTRICULAR PACING ICD: 1 pct

## 2012-05-09 ENCOUNTER — Encounter: Payer: Self-pay | Admitting: *Deleted

## 2012-07-03 ENCOUNTER — Encounter: Payer: Self-pay | Admitting: Gastroenterology

## 2012-08-11 ENCOUNTER — Ambulatory Visit (INDEPENDENT_AMBULATORY_CARE_PROVIDER_SITE_OTHER): Payer: Medicare Other | Admitting: *Deleted

## 2012-08-11 ENCOUNTER — Encounter: Payer: Self-pay | Admitting: Internal Medicine

## 2012-08-11 DIAGNOSIS — Z95810 Presence of automatic (implantable) cardiac defibrillator: Secondary | ICD-10-CM

## 2012-08-11 DIAGNOSIS — I5022 Chronic systolic (congestive) heart failure: Secondary | ICD-10-CM

## 2012-08-11 DIAGNOSIS — I2589 Other forms of chronic ischemic heart disease: Secondary | ICD-10-CM

## 2012-08-13 LAB — REMOTE ICD DEVICE
BMOD-0002RV: 10
DEV-0020ICD: NEGATIVE
HV IMPEDENCE: 44 Ohm
RV LEAD AMPLITUDE: 12 mv
RV LEAD IMPEDENCE ICD: 310 Ohm
TZAT-0004FASTVT: 8
TZAT-0013SLOWVT: 2
TZAT-0018FASTVT: NEGATIVE
TZAT-0020SLOWVT: 1 ms
TZON-0003FASTVT: 280 ms
TZON-0005SLOWVT: 6
TZON-0010FASTVT: 80 ms
TZON-0010SLOWVT: 80 ms
TZST-0001FASTVT: 2
TZST-0001FASTVT: 3
TZST-0001FASTVT: 5
TZST-0001SLOWVT: 2
TZST-0001SLOWVT: 4
TZST-0003FASTVT: 20 J
TZST-0003FASTVT: 40 J
TZST-0003FASTVT: 40 J
TZST-0003SLOWVT: 36 J

## 2012-09-19 ENCOUNTER — Encounter: Payer: Self-pay | Admitting: *Deleted

## 2012-09-29 ENCOUNTER — Telehealth: Payer: Self-pay

## 2012-09-29 NOTE — Telephone Encounter (Signed)
C/o pain in bilateral lower legs from feet to thigh.  States pain is usually at rest.  Denies any change in sensation of feet/legs.  Denies any open sores.  States her lower legs feel cool at times.  Requesting appt. ASAP.  Discussed w/ Dr. Myra Gianotti.   Rec'd. v.o. for ABI's and bilat. Lower extremity arterial duplex, and to sched office visit.

## 2012-10-03 ENCOUNTER — Encounter: Payer: Self-pay | Admitting: Neurosurgery

## 2012-10-03 ENCOUNTER — Other Ambulatory Visit: Payer: Self-pay | Admitting: *Deleted

## 2012-10-03 DIAGNOSIS — I739 Peripheral vascular disease, unspecified: Secondary | ICD-10-CM

## 2012-10-03 DIAGNOSIS — Z48812 Encounter for surgical aftercare following surgery on the circulatory system: Secondary | ICD-10-CM

## 2012-10-06 ENCOUNTER — Encounter: Payer: Self-pay | Admitting: Neurosurgery

## 2012-10-06 ENCOUNTER — Ambulatory Visit (INDEPENDENT_AMBULATORY_CARE_PROVIDER_SITE_OTHER): Payer: Medicare Other | Admitting: Neurosurgery

## 2012-10-06 ENCOUNTER — Encounter (INDEPENDENT_AMBULATORY_CARE_PROVIDER_SITE_OTHER): Payer: Medicare Other | Admitting: *Deleted

## 2012-10-06 VITALS — BP 133/72 | HR 62 | Resp 16 | Ht 64.0 in | Wt 133.8 lb

## 2012-10-06 DIAGNOSIS — Z48812 Encounter for surgical aftercare following surgery on the circulatory system: Secondary | ICD-10-CM

## 2012-10-06 DIAGNOSIS — I739 Peripheral vascular disease, unspecified: Secondary | ICD-10-CM

## 2012-10-06 DIAGNOSIS — M79609 Pain in unspecified limb: Secondary | ICD-10-CM

## 2012-10-06 NOTE — Progress Notes (Signed)
[facility] [place] PAD/PVD Office Note  CC: PAD surveillance with bilateral lower extremity pain Referring Physician: Hart Rochester  History of Present Illness: 67 year old female patient of Dr. Hart Rochester who status post a left superficial femoral to posterior tibial artery bypass in 2002 with a right femoropopliteal bypass in 2006 and left right femoral to femoral graft in 2010. I saw the patient in August for her normal ABIs and graft duplex at which time the patient states she had lower extremity pain but did not want any further intervention. The patient called the office asking to be seen earlier than her one-year followup due to continued pain. The patient describes bilateral hip joint pain as well as some radicular type pain that may be related to the lumbar spine. The patient states she can walk with signs of questionable claudication as well as some rest pain. The patient is seeing Dr. Devonne Doughty date [facility] orthopedics regarding her hips and knees for which he is planning intervention.  Past Medical History  Diagnosis Date  . PVD (peripheral vascular disease)     status post prior femoral-popliteal bypass  . Back pain   . Arthritis   . CAD (coronary artery disease)     status post prior stenting to the proximal RCA, status post anterior STEMI 10/2008 (RCA occluded at the prior stent, LAD treated with a bare metal stent and a drug-eluting stent),  . Chronic systolic heart failure     echo 11/2011: EF 25-30%, AS and apical AK, trivial AI, mild MR, PASP 34.  . Anemia   . Ischemic cardiomyopathy   . AICD (automatic cardioverter/defibrillator) present   . HLD (hyperlipidemia)   . GERD (gastroesophageal reflux disease)   . RLS (restless legs syndrome)   . Depression   . Tobacco abuse   . Pain in joint, lower leg   . Depressive disorder, not elsewhere classified   . Myocardial infarction 2010    ROS: [x]  Positive   [ ]  Denies    General: [ ]  Weight loss, [ ]  Fever, [ ]   chills Neurologic: [ ]  Dizziness, [ ]  Blackouts, [ ]  Seizure [ ]  Stroke, [ ]  "Mini stroke", [ ]  Slurred speech, [ ]  Temporary blindness; [ ]  weakness in arms or legs, [ ]  Hoarseness Cardiac: [ ]  Chest pain/pressure, [ ]  Shortness of breath at rest [ ]  Shortness of breath with exertion, [ ]  Atrial fibrillation or irregular heartbeat Vascular: [x ] Pain in legs with walking, [x ] Pain in legs at rest, [ ]  Pain in legs at night,  [ ]  Non-healing ulcer, [ ]  Blood clot in vein/DVT,   Pulmonary: [ ]  Home oxygen, [ ]  Productive cough, [ ]  Coughing up blood, [ ]  Asthma,  [ ]  Wheezing Musculoskeletal:  [ ]  Arthritis, [ ]  Low back pain, [ ]  Joint pain Hematologic: [ ]  Easy Bruising, [ ]  Anemia; [ ]  Hepatitis Gastrointestinal: [ ]  Blood in stool, [ ]  Gastroesophageal Reflux/heartburn, [ ]  Trouble swallowing Urinary: [ ]  chronic Kidney disease, [ ]  on HD - [ ]  MWF or [ ]  TTHS, [ ]  Burning with urination, [ ]  Difficulty urinating Skin: [ ]  Rashes, [ ]  Wounds Psychological: [ ]  Anxiety, [ ]  Depression   Social History History  Substance Use Topics  . Smoking status: Current Every Day Smoker -- 1.0 packs/day for 55 years    Types: Cigarettes  . Smokeless tobacco: Never Used  . Alcohol Use: No    Family History Family History  Problem Relation Age of Onset  . Peripheral vascular disease Sister   . Heart disease Brother   . Diabetes Mother   . Heart attack Father     Allergies  Allergen Reactions  . Morphine     REACTION: makes me crazy  . Naproxen Other (See Comments)    Blurred vision  . Prednisone     Mean to her grandbabies    Current Outpatient Prescriptions  Medication Sig Dispense Refill  . allopurinol (ZYLOPRIM) 100 MG tablet Take 100 mg by mouth daily.        Marland Kitchen aspirin 81 MG tablet Take 81 mg by mouth daily.        . Cholecalciferol (VITAMIN D) 1000 UNITS capsule Take 1,000 Units by mouth daily.        . citalopram (CELEXA) 40 MG tablet Take 40 mg by mouth daily.      .  clonazePAM (KLONOPIN) 1 MG tablet Take 0.5-2 mg by mouth 2 (two) times daily as needed. Take 1 tablet in the morning and 2 tablets in the evening      . clopidogrel (PLAVIX) 75 MG tablet Take 1 tablet (75 mg total) by mouth daily.  90 tablet  3  . clopidogrel (PLAVIX) 75 MG tablet TAKE 1 TABLET BY MOUTH EVERY DAY  30 tablet  6  . estrogens, conjugated, (PREMARIN) 0.625 MG tablet Take 0.625 mg by mouth daily. Take daily for 21 days then do not take for 7 days.       Marland Kitchen ezetimibe (ZETIA) 10 MG tablet Take 10 mg by mouth daily.        . ferrous sulfate 325 (65 FE) MG tablet Take 325 mg by mouth daily with breakfast.        . fish oil-omega-3 fatty acids 1000 MG capsule Take 2 g by mouth daily.      . furosemide (LASIX) 40 MG tablet TAKE 60 mg (1 and 1/2 tabs) in the morning and take 40 mg (1 tab) in the evening  225 tablet  1  . HYDROcodone-acetaminophen (VICODIN) 5-500 MG per tablet Take 1 tablet by mouth as directed.        . metoprolol succinate (TOPROL-XL) 50 MG 24 hr tablet Take 1 tablet (50 mg total) by mouth daily.  90 tablet  3  . nitroGLYCERIN (NITROSTAT) 0.4 MG SL tablet Place 1 tablet (0.4 mg total) under the tongue every 5 (five) minutes as needed. For chest pain  25 tablet  2  . pantoprazole (PROTONIX) 40 MG tablet Take 40 mg by mouth daily.        . potassium chloride SA (K-DUR,KLOR-CON) 20 MEQ tablet TAKE 1 TABLET ONLY ON MONDAYS, WEDS, FRI'S  90 tablet  3  . ramipril (ALTACE) 2.5 MG capsule Take 1 capsule (2.5 mg total) by mouth daily.  90 capsule  3  . rosuvastatin (CRESTOR) 20 MG tablet Take 1 tablet (20 mg total) by mouth daily.  30 tablet  9  . Skin Protectants, Misc. (EUCERIN) cream Apply 1 application topically 2 (two) times daily as needed.      . zolpidem (AMBIEN) 10 MG tablet Take 10 mg by mouth at bedtime as needed.        . [DISCONTINUED] mirtazapine (REMERON) 15 MG tablet Take 15 mg by mouth as needed.        . [DISCONTINUED] ranitidine (ZANTAC) 300 MG tablet Take 300 mg by  mouth 2 (two) times daily.  Physical Examination  Filed Vitals:   10/06/12 1328  BP: 133/72  Pulse: 62  Resp: 16    Body mass index is 22.97 kg/(m^2).  General:  WDWN in NAD Gait: Normal HEENT: WNL Eyes: Pupils equal Pulmonary: normal non-labored breathing , without Rales, rhonchi,  wheezing Cardiac: RRR, without  Murmurs, rubs or gallops; No carotid bruits Abdomen: soft, NT, no masses Skin: no rashes, ulcers noted Vascular Exam/Pulses: 3+ radial pulses, the patient has palpable lower extremity pulses bilaterally  Extremities without ischemic changes, no Gangrene , no cellulitis; no open wounds;  Musculoskeletal: no muscle wasting or atrophy  Neurologic: A&O X 3; Appropriate Affect ; SENSATION: normal; MOTOR FUNCTION:  moving all extremities equally. Speech is fluent/normal  Non-Invasive Vascular Imaging: ABIs today are 1.0 to biphasic on the right, 1.03 with a triphasic inflow artery as well as biphasic flow throughout this is slightly improved from previous exam in August when she was 0.92 on the right 0.93 on the left there is elevated velocities of 336 cm's/s of left external iliac artery  ASSESSMENT/PLAN: This is a patient with lower extremity pain has not changed in "10 years". The patient has normal ABIs with patent bypass grafts, the patient is also under the care of an orthopedist regarding her hip joints and knees. The patient will followup in one year with repeat ABIs and bypass graft scan however she knows to call the office if her condition worsens and she does request further intervention or diagnostics.  Lauree Chandler ANP  Clinic M.D.: Hart Rochester

## 2012-10-06 NOTE — Addendum Note (Signed)
Addended by: Sharee Pimple on: 10/06/2012 02:30 PM   Modules accepted: Orders

## 2012-11-17 ENCOUNTER — Ambulatory Visit (INDEPENDENT_AMBULATORY_CARE_PROVIDER_SITE_OTHER): Payer: Medicare Other | Admitting: *Deleted

## 2012-11-17 ENCOUNTER — Other Ambulatory Visit: Payer: Self-pay | Admitting: Internal Medicine

## 2012-11-17 ENCOUNTER — Encounter: Payer: Self-pay | Admitting: Internal Medicine

## 2012-11-17 DIAGNOSIS — Z95810 Presence of automatic (implantable) cardiac defibrillator: Secondary | ICD-10-CM

## 2012-11-17 DIAGNOSIS — I5022 Chronic systolic (congestive) heart failure: Secondary | ICD-10-CM

## 2012-11-18 LAB — REMOTE ICD DEVICE
BMOD-0002RV: 10
DEVICE MODEL ICD: 726698
HV IMPEDENCE: 52 Ohm
RV LEAD AMPLITUDE: 12 mv
TZAT-0001FASTVT: 1
TZAT-0001SLOWVT: 1
TZAT-0004FASTVT: 8
TZAT-0004SLOWVT: 8
TZAT-0012FASTVT: 200 ms
TZAT-0013FASTVT: 1
TZAT-0019SLOWVT: 7.5 V
TZAT-0020SLOWVT: 1 ms
TZON-0004SLOWVT: 30
TZON-0010SLOWVT: 80 ms
TZST-0001FASTVT: 3
TZST-0001FASTVT: 5
TZST-0001SLOWVT: 3
TZST-0001SLOWVT: 4
TZST-0003FASTVT: 20 J
TZST-0003FASTVT: 36 J
TZST-0003SLOWVT: 15 J
VENTRICULAR PACING ICD: 1 pct

## 2012-11-19 ENCOUNTER — Other Ambulatory Visit: Payer: Self-pay | Admitting: Physician Assistant

## 2012-11-26 ENCOUNTER — Encounter: Payer: Self-pay | Admitting: *Deleted

## 2012-12-05 ENCOUNTER — Other Ambulatory Visit: Payer: Self-pay | Admitting: Internal Medicine

## 2012-12-09 ENCOUNTER — Other Ambulatory Visit: Payer: Self-pay | Admitting: Orthopedic Surgery

## 2012-12-09 DIAGNOSIS — M545 Low back pain: Secondary | ICD-10-CM

## 2012-12-23 ENCOUNTER — Ambulatory Visit
Admission: RE | Admit: 2012-12-23 | Discharge: 2012-12-23 | Disposition: A | Discharge status: Home / Self Care | Payer: Medicare Other | Source: Ambulatory Visit | Attending: Orthopedic Surgery | Admitting: Orthopedic Surgery

## 2012-12-23 ENCOUNTER — Inpatient Hospital Stay
Admission: RE | Admit: 2012-12-23 | Discharge: 2012-12-23 | Disposition: A | Discharge status: Home / Self Care | Payer: Self-pay | Source: Ambulatory Visit | Attending: Internal Medicine | Admitting: Internal Medicine

## 2012-12-23 ENCOUNTER — Other Ambulatory Visit: Payer: Self-pay | Admitting: Internal Medicine

## 2012-12-23 VITALS — BP 147/77 | HR 87

## 2012-12-23 DIAGNOSIS — M545 Low back pain: Secondary | ICD-10-CM

## 2012-12-23 DIAGNOSIS — R52 Pain, unspecified: Secondary | ICD-10-CM

## 2012-12-23 MED ORDER — ONDANSETRON HCL 4 MG/2ML IJ SOLN
4.0000 mg | Freq: Once | INTRAMUSCULAR | Status: AC
  Administered 2012-12-23: 4 mg via INTRAMUSCULAR

## 2012-12-23 MED ORDER — MEPERIDINE HCL 100 MG/ML IJ SOLN
50.0000 mg | Freq: Once | INTRAMUSCULAR | Status: AC
  Administered 2012-12-23: 50 mg via INTRAMUSCULAR

## 2012-12-23 MED ORDER — IOHEXOL 180 MG/ML  SOLN
15.0000 mL | Freq: Once | INTRAMUSCULAR | Status: AC | PRN
  Administered 2012-12-23: 15 mL via INTRATHECAL

## 2012-12-23 MED ORDER — DIAZEPAM 5 MG PO TABS
5.0000 mg | ORAL_TABLET | Freq: Once | ORAL | Status: AC
  Administered 2012-12-23: 5 mg via ORAL

## 2012-12-23 NOTE — Progress Notes (Signed)
Patient states she has been off Plavix for the past five days and Bupropion for the past two days.  Discharge instructions explained to patient.  Questions answered.  jkl

## 2013-01-29 ENCOUNTER — Ambulatory Visit (INDEPENDENT_AMBULATORY_CARE_PROVIDER_SITE_OTHER): Payer: Medicare Other | Admitting: Internal Medicine

## 2013-01-29 ENCOUNTER — Encounter: Payer: Self-pay | Admitting: Internal Medicine

## 2013-01-29 VITALS — BP 122/84 | HR 65 | Ht 64.0 in | Wt 139.4 lb

## 2013-01-29 DIAGNOSIS — I2589 Other forms of chronic ischemic heart disease: Secondary | ICD-10-CM

## 2013-01-29 DIAGNOSIS — Z95810 Presence of automatic (implantable) cardiac defibrillator: Secondary | ICD-10-CM

## 2013-01-29 DIAGNOSIS — I2510 Atherosclerotic heart disease of native coronary artery without angina pectoris: Secondary | ICD-10-CM

## 2013-01-29 DIAGNOSIS — I5022 Chronic systolic (congestive) heart failure: Secondary | ICD-10-CM

## 2013-01-29 LAB — ICD DEVICE OBSERVATION
BMOD-0002RV: 10
BRDY-0002RV: 40 {beats}/min
BRDY-0004RV: 120 {beats}/min
DEVICE MODEL ICD: 726698
FVT: 0
PACEART VT: 0
RV LEAD AMPLITUDE: 12 mv
RV LEAD IMPEDENCE ICD: 325 Ohm
RV LEAD THRESHOLD: 0.75 V
TOT-0008: 0
TZAT-0001SLOWVT: 1
TZAT-0004SLOWVT: 8
TZAT-0012FASTVT: 200 ms
TZAT-0012SLOWVT: 200 ms
TZAT-0013FASTVT: 1
TZAT-0013SLOWVT: 2
TZAT-0018FASTVT: NEGATIVE
TZAT-0019FASTVT: 7.5 V
TZAT-0019SLOWVT: 7.5 V
TZAT-0020FASTVT: 1 ms
TZON-0003FASTVT: 280 ms
TZON-0004FASTVT: 16
TZON-0004SLOWVT: 30
TZON-0005FASTVT: 6
TZON-0005SLOWVT: 6
TZST-0001FASTVT: 2
TZST-0001FASTVT: 4
TZST-0001FASTVT: 5
TZST-0001SLOWVT: 3
TZST-0003FASTVT: 36 J
TZST-0003FASTVT: 40 J
TZST-0003SLOWVT: 15 J
TZST-0003SLOWVT: 36 J
VF: 0

## 2013-01-29 NOTE — Patient Instructions (Signed)
Your physician wants you to follow-up in: 12 months with Dr Court Joy will receive a reminder letter in the mail two months in advance. If you don't receive a letter, please call our office to schedule the follow-up appointment.    Remote monitoring is used to monitor your Pacemaker of ICD from home. This monitoring reduces the number of office visits required to check your device to one time per year. It allows [place] to keep an eye on the functioning of your device to ensure it is working properly. You are scheduled for a device check from home on 05/04/13. You may send your transmission at any time that day. If you have a wireless device, the transmission will be sent automatically. After your physician reviews your transmission, you will receive a postcard with your next transmission date.   Your physician has recommended you make the following change in your medication:  1) Stop Plavix

## 2013-01-29 NOTE — Progress Notes (Signed)
HPI [name] returns today for followup. She is a very pleasant 67 year old woman with chronic peripheral vascular disease, and ischemic cardiomyopathy, chronic systolic heart failure, status post ICD implantation. She is 4 years out from revascularization. She complains of bleeding which has been predominantly superficial. No syncope and no recent ICD shocks. She has claudication. She is followed by Dr. Hart Rochester for this. No recent ICD shocks.  She has chronic dyspnea which is multifactorial. Allergies  Allergen Reactions  . Naproxen Other (See Comments)    Blurred vision  . Prednisone Other (See Comments)    Mean to her grandbabies     Current Outpatient Prescriptions  Medication Sig Dispense Refill  . allopurinol (ZYLOPRIM) 100 MG tablet Take 100 mg by mouth daily.        Marland Kitchen aspirin 81 MG tablet Take 81 mg by mouth daily.        . Cholecalciferol (VITAMIN D) 1000 UNITS capsule Take 1,000 Units by mouth daily.        . citalopram (CELEXA) 40 MG tablet Take 40 mg by mouth daily.      . clonazePAM (KLONOPIN) 1 MG tablet Take 0.5-2 mg by mouth 2 (two) times daily as needed. Take 1 tablet in the morning and 2 tablets in the evening      . clopidogrel (PLAVIX) 75 MG tablet Take 1 tablet (75 mg total) by mouth daily.  90 tablet  3  . clopidogrel (PLAVIX) 75 MG tablet TAKE 1 TABLET BY MOUTH EVERY DAY  30 tablet  6  . estrogens, conjugated, (PREMARIN) 0.625 MG tablet Take 0.625 mg by mouth daily. Take daily for 21 days then do not take for 7 days.      Marland Kitchen ezetimibe (ZETIA) 10 MG tablet Take 10 mg by mouth daily.        . ferrous sulfate 325 (65 FE) MG tablet Take 325 mg by mouth daily with breakfast.        . fish oil-omega-3 fatty acids 1000 MG capsule Take 2 g by mouth daily.      . furosemide (LASIX) 40 MG tablet TAKE 1&1/2 TABLETS IN THE MORNING AND 1 IN THE EVENING  225 tablet  1  . HYDROcodone-acetaminophen (VICODIN) 5-500 MG per tablet Take 1 tablet by mouth as directed.        . metoprolol  succinate (TOPROL-XL) 50 MG 24 hr tablet Take 1 tablet (50 mg total) by mouth daily.  90 tablet  3  . nitroGLYCERIN (NITROSTAT) 0.4 MG SL tablet Place 1 tablet (0.4 mg total) under the tongue every 5 (five) minutes as needed. For chest pain  25 tablet  2  . pantoprazole (PROTONIX) 40 MG tablet Take 40 mg by mouth daily.        . potassium chloride SA (K-DUR,KLOR-CON) 20 MEQ tablet TAKE 1 TABLET ONLY ON MONDAYS, WEDS, FRI'S  90 tablet  3  . ramipril (ALTACE) 2.5 MG capsule Take 1 capsule (2.5 mg total) by mouth daily.  90 capsule  3  . rosuvastatin (CRESTOR) 20 MG tablet Take 1 tablet (20 mg total) by mouth daily.  30 tablet  9  . Skin Protectants, Misc. (EUCERIN) cream Apply 1 application topically 2 (two) times daily as needed.      . zolpidem (AMBIEN) 10 MG tablet Take 10 mg by mouth at bedtime as needed.        . [DISCONTINUED] mirtazapine (REMERON) 15 MG tablet Take 15 mg by mouth as needed.        . [  DISCONTINUED] ranitidine (ZANTAC) 300 MG tablet Take 300 mg by mouth 2 (two) times daily.         No current facility-administered medications for this visit.     Past Medical History  Diagnosis Date  . PVD (peripheral vascular disease)     status post prior femoral-popliteal bypass  . Back pain   . Arthritis   . CAD (coronary artery disease)     status post prior stenting to the proximal RCA, status post anterior STEMI 10/2008 (RCA occluded at the prior stent, LAD treated with a bare metal stent and a drug-eluting stent),  . Chronic systolic heart failure     echo 11/2011: EF 25-30%, AS and apical AK, trivial AI, mild MR, PASP 34.  . Anemia   . Ischemic cardiomyopathy   . AICD (automatic cardioverter/defibrillator) present   . HLD (hyperlipidemia)   . GERD (gastroesophageal reflux disease)   . RLS (restless legs syndrome)   . Depression   . Tobacco abuse   . Pain in joint, lower leg   . Depressive disorder, not elsewhere classified   . Myocardial infarction 2010    ROS:   All  systems reviewed and negative except as noted in the HPI.   Past Surgical History  Procedure Laterality Date  . Visual merchandiser    . Foot surgery    . Pr vein bypass graft,aorto-fem-pop      fem fem  04/07/09  . Abdominal hysterectomy    . Cardiac defibrillator placement    . Eye surgery  2013    Cataract bilateral      Family History  Problem Relation Age of Onset  . Peripheral vascular disease Sister   . Heart disease Brother   . Diabetes Mother   . Heart attack Father      History   Social History  . Marital Status: Married    Spouse Name: N/A    Number of Children: N/A  . Years of Education: N/A   Occupational History  . Not on file.   Social History Main Topics  . Smoking status: Current Every Day Smoker -- 1.00 packs/day for 55 years    Types: Cigarettes  . Smokeless tobacco: Never Used  . Alcohol Use: No  . Drug Use: No  . Sexually Active: Not on file   Other Topics Concern  . Not on file   Social History Narrative  . No narrative on file     BP 122/84  Pulse 65  Ht 5\' 4"  (1.626 m)  Wt 139 lb 6.4 oz (63.231 kg)  BMI 23.92 kg/m2  Physical Exam:  Well appearing NAD HEENT: Unremarkable Neck:  7 cm JVD, no thyromegally Back:  No CVA tenderness Lungs:  Clear with decreased breath sounds throughout. No wheezes or rhonchi. HEART:  Regular rate rhythm, no murmurs, no rubs, no clicks Abd:  soft, positive bowel sounds, no organomegally, no rebound, no guarding Ext:  2 plus pulses, no edema, no cyanosis, no clubbing Skin:  No rashes no nodules Neuro:  CN II through XII intact, motor grossly intact  EKG - normal sinus rhythm left ventricular hypertrophy. Nonspecific T-wave abnormality  DEVICE  Normal device function.  See PaceArt for details.   Assess/Plan:

## 2013-01-29 NOTE — Assessment & Plan Note (Signed)
Her heart failure symptoms are class II. She will continue her current medical therapy, and I've asked the patient to reduce her sodium intake.

## 2013-01-29 NOTE — Assessment & Plan Note (Signed)
She is status post MI years ago. No revascularization in 4 years. She has had bothersome ecchymoses and superficial skin bleeding. I've asked the patient to stop Plavix.

## 2013-01-29 NOTE — Assessment & Plan Note (Signed)
Her [employer] defibrillator is working normally. She has had no intercurrent ICD shocks.

## 2013-03-21 ENCOUNTER — Other Ambulatory Visit: Payer: Self-pay | Admitting: Physician Assistant

## 2013-04-13 ENCOUNTER — Other Ambulatory Visit: Payer: Self-pay | Admitting: Internal Medicine

## 2013-04-15 ENCOUNTER — Other Ambulatory Visit: Payer: Self-pay | Admitting: Internal Medicine

## 2013-05-04 ENCOUNTER — Ambulatory Visit (INDEPENDENT_AMBULATORY_CARE_PROVIDER_SITE_OTHER): Payer: Medicare Other | Admitting: *Deleted

## 2013-05-04 ENCOUNTER — Encounter: Payer: Self-pay | Admitting: Internal Medicine

## 2013-05-04 DIAGNOSIS — I2589 Other forms of chronic ischemic heart disease: Secondary | ICD-10-CM

## 2013-05-04 DIAGNOSIS — Z95810 Presence of automatic (implantable) cardiac defibrillator: Secondary | ICD-10-CM

## 2013-05-04 DIAGNOSIS — I5022 Chronic systolic (congestive) heart failure: Secondary | ICD-10-CM

## 2013-05-05 ENCOUNTER — Ambulatory Visit: Payer: Medicare Other | Admitting: Neurosurgery

## 2013-05-05 LAB — REMOTE ICD DEVICE
BRDY-0002RV: 40 {beats}/min
BRDY-0004RV: 120 {beats}/min
DEV-0020ICD: NEGATIVE
TZAT-0004FASTVT: 8
TZAT-0004SLOWVT: 8
TZAT-0013SLOWVT: 2
TZAT-0018FASTVT: NEGATIVE
TZAT-0018SLOWVT: NEGATIVE
TZAT-0019FASTVT: 7.5 V
TZAT-0020FASTVT: 1 ms
TZON-0003FASTVT: 280 ms
TZON-0003SLOWVT: 335 ms
TZON-0004FASTVT: 16
TZON-0005FASTVT: 6
TZON-0010FASTVT: 80 ms
TZST-0001FASTVT: 2
TZST-0001FASTVT: 3
TZST-0001FASTVT: 4
TZST-0001SLOWVT: 3
TZST-0003FASTVT: 40 J
TZST-0003SLOWVT: 36 J

## 2013-05-08 ENCOUNTER — Encounter: Payer: Self-pay | Admitting: *Deleted

## 2013-05-26 ENCOUNTER — Other Ambulatory Visit: Payer: Self-pay | Admitting: Internal Medicine

## 2013-06-23 ENCOUNTER — Telehealth: Payer: Self-pay

## 2013-06-23 NOTE — Telephone Encounter (Signed)
Pt. called to request an appt.  States her left foot toes are all black/ dark blue color.  Crying with pain.  Stated 3 of the toes of left foot were black the past week, and now all 5 toes are black today.  States the pain has gotten so bad she can hardly stand it.  Advised to go to the [facility] ER, due to the dramatic change in color and degree of pain today.  Pt. Agrees with plan.

## 2013-06-25 ENCOUNTER — Encounter: Payer: Self-pay | Admitting: Vascular Surgery

## 2013-06-25 ENCOUNTER — Ambulatory Visit (INDEPENDENT_AMBULATORY_CARE_PROVIDER_SITE_OTHER): Payer: Medicare Other | Admitting: Vascular Surgery

## 2013-06-25 ENCOUNTER — Ambulatory Visit (HOSPITAL_COMMUNITY)
Admission: RE | Admit: 2013-06-25 | Discharge: 2013-06-25 | Disposition: A | Discharge status: Home / Self Care | Payer: Medicare Other | Source: Ambulatory Visit | Attending: Vascular Surgery | Admitting: Vascular Surgery

## 2013-06-25 ENCOUNTER — Ambulatory Visit (INDEPENDENT_AMBULATORY_CARE_PROVIDER_SITE_OTHER)
Admission: RE | Admit: 2013-06-25 | Discharge: 2013-06-25 | Disposition: A | Discharge status: Home / Self Care | Payer: Medicare Other | Source: Ambulatory Visit | Attending: Vascular Surgery | Admitting: Vascular Surgery

## 2013-06-25 ENCOUNTER — Other Ambulatory Visit: Payer: Commercial Managed Care - HMO

## 2013-06-25 ENCOUNTER — Telehealth: Payer: Self-pay

## 2013-06-25 VITALS — BP 151/81 | HR 74 | Ht 64.0 in | Wt 138.0 lb

## 2013-06-25 DIAGNOSIS — I998 Other disorder of circulatory system: Secondary | ICD-10-CM

## 2013-06-25 DIAGNOSIS — I70229 Atherosclerosis of native arteries of extremities with rest pain, unspecified extremity: Secondary | ICD-10-CM

## 2013-06-25 DIAGNOSIS — I999 Unspecified disorder of circulatory system: Secondary | ICD-10-CM

## 2013-06-25 DIAGNOSIS — I739 Peripheral vascular disease, unspecified: Secondary | ICD-10-CM | POA: Insufficient documentation

## 2013-06-25 NOTE — Progress Notes (Signed)
[facility] [place] HISTORY AND PHYSICAL   History of Present Illness:  Patient is a 67 y.o. year old female who presents for evaluation of pain in the left foot with bluish discoloration of left foot. The patient started having symptoms of pain in her left foot 4-5 days ago. The foot hurts continuously. She has no history of ulcerations on the feet. The patient has an extensive vascular reconstructive history. In 2002 she had a left superficial femoral artery to posterior tibial artery bypass with vein. In 2006 she had a right femoral to below-knee popliteal bypass with vein. In 2010 she had a left-to-right femoral-femoral bypass. These were all done by Dr. Lawson. The patient was recently seen in January. At that time her ABIs were normal bilaterally. Other medical problems include coronary artery disease, congestive failure with last ejection fraction 25-30%, AICD. Her cardiac status overall has been stable recently. Unfortunately the patient continues to smoke one and a half pack of cigarettes per day.  Past Medical History  Diagnosis Date  . PVD (peripheral vascular disease)     status post prior femoral-popliteal bypass  . Back pain   . Arthritis   . CAD (coronary artery disease)     status post prior stenting to the proximal RCA, status post anterior STEMI 10/2008 (RCA occluded at the prior stent, LAD treated with a bare metal stent and a drug-eluting stent),  . Chronic systolic heart failure     echo 11/2011: EF 25-30%, AS and apical AK, trivial AI, mild MR, PASP 34.  . Anemia   . Ischemic cardiomyopathy   . AICD (automatic cardioverter/defibrillator) present   . HLD (hyperlipidemia)   . GERD (gastroesophageal reflux disease)   . RLS (restless legs syndrome)   . Depression   . Tobacco abuse   . Pain in joint, lower leg   . Depressive disorder, not elsewhere classified   . Myocardial infarction 2010    Past Surgical History  Procedure Laterality Date  . Pace  maker    . Foot surgery    . Pr vein bypass graft,aorto-fem-pop      fem fem  04/07/09  . Abdominal hysterectomy    . Cardiac defibrillator placement    . Eye surgery  2013    Cataract bilateral     Social History History  Substance Use Topics  . Smoking status: Current Every Day Smoker -- 1.00 packs/day for 55 years    Types: Cigarettes  . Smokeless tobacco: Never Used  . Alcohol Use: No    Family History Family History  Problem Relation Age of Onset  . Peripheral vascular disease Sister   . Heart disease Brother   . Diabetes Mother   . Heart attack Father     Allergies  Allergies  Allergen Reactions  . Naproxen Other (See Comments)    Blurred vision  . Prednisone Other (See Comments)    Mean to her grandbabies     Current Outpatient Prescriptions  Medication Sig Dispense Refill  . allopurinol (ZYLOPRIM) 100 MG tablet Take 100 mg by mouth daily.        . aspirin 81 MG tablet Take 81 mg by mouth daily.        . Cholecalciferol (VITAMIN D) 1000 UNITS capsule Take 1,000 Units by mouth daily.        . citalopram (CELEXA) 40 MG tablet Take 40 mg by mouth daily.      . clonazePAM (KLONOPIN) 1 MG tablet Take 0.5-2   mg by mouth 2 (two) times daily as needed. Take 1 tablet in the morning and 2 tablets in the evening      . estrogens, conjugated, (PREMARIN) 0.625 MG tablet Take 0.625 mg by mouth daily. Take daily for 21 days then do not take for 7 days.      . ezetimibe (ZETIA) 10 MG tablet Take 10 mg by mouth daily.        . ferrous sulfate 325 (65 FE) MG tablet Take 325 mg by mouth daily with breakfast.        . fish oil-omega-3 fatty acids 1000 MG capsule Take 2 g by mouth daily.      . furosemide (LASIX) 40 MG tablet TAKE 1&1/2 TABLETS IN THE MORNING AND 1 IN THE EVENING  225 tablet  1  . HYDROcodone-acetaminophen (VICODIN) 5-500 MG per tablet Take 1 tablet by mouth as directed.        . metoprolol succinate (TOPROL-XL) 50 MG 24 hr tablet Take 1 tablet (50 mg total) by  mouth daily.  90 tablet  3  . metoprolol succinate (TOPROL-XL) 50 MG 24 hr tablet TAKE 1 TABLET BY MOUTH DAILY  30 tablet  9  . nitroGLYCERIN (NITROSTAT) 0.4 MG SL tablet Place 1 tablet (0.4 mg total) under the tongue every 5 (five) minutes as needed. For chest pain  25 tablet  2  . pantoprazole (PROTONIX) 40 MG tablet Take 40 mg by mouth daily.        . potassium chloride SA (K-DUR,KLOR-CON) 20 MEQ tablet TAKE 1 TABLET BY MOUTH ON MONDAYS,WEDNESDAYS, AND FRIDAYS  90 tablet  2  . ramipril (ALTACE) 2.5 MG capsule TAKE ONE CAPSULE BY MOUTH DAILY  90 capsule  2  . rosuvastatin (CRESTOR) 20 MG tablet Take 1 tablet (20 mg total) by mouth daily.  30 tablet  9  . Skin Protectants, Misc. (EUCERIN) cream Apply 1 application topically 2 (two) times daily as needed.      . zolpidem (AMBIEN) 10 MG tablet Take 10 mg by mouth at bedtime as needed.        . [DISCONTINUED] mirtazapine (REMERON) 15 MG tablet Take 15 mg by mouth as needed.        . [DISCONTINUED] ranitidine (ZANTAC) 300 MG tablet Take 300 mg by mouth 2 (two) times daily.         No current facility-administered medications for this visit.    ROS:   General:  No weight loss, Fever, chills  HEENT: No recent headaches, no nasal bleeding, no visual changes, no sore throat  Neurologic: No dizziness, blackouts, seizures. No recent symptoms of stroke or mini- stroke. No recent episodes of slurred speech, or temporary blindness.  Cardiac: No recent episodes of chest pain/pressure, no shortness of breath at rest.  + shortness of breath with exertion.  Denies history of atrial fibrillation or irregular heartbeat  Vascular: + history of rest pain in feet.  No history of claudication.  No history of non-healing ulcer, No history of DVT   Pulmonary: No home oxygen, no productive cough, no hemoptysis,  No asthma or wheezing  Musculoskeletal:  [ ] Arthritis, [ ] Low back pain,  [ ] Joint pain  Hematologic:No history of hypercoagulable state.  No  history of easy bleeding.  No history of anemia  Gastrointestinal: No hematochezia or melena,  No gastroesophageal reflux, no trouble swallowing  Urinary: [ ] chronic Kidney disease, [ ] on HD - [ ] MWF or [ ] TTHS, [ ]   Burning with urination, [ ] Frequent urination, [ ] Difficulty urinating;   Skin: No rashes  Psychological: No history of anxiety,  No history of depression   Physical Examination  Filed Vitals:   06/25/13 1433  BP: 151/81  Pulse: 74  Height: 5' 4" (1.626 m)  Weight: 138 lb (62.596 kg)  SpO2: 100%    Body mass index is 23.68 kg/(m^2).  General:  Alert and oriented, no acute distress HEENT: Normal Neck: No bruit or JVD Pulmonary: Clear to auscultation bilaterally Cardiac: Regular Rate and Rhythm without murmur Abdomen: Soft, non-tender, non-distended, no mass Skin: No rash, both feet are dusky and symmetric in appearance Extremity Pulses:  2+ radial, brachial, absent femoral, dorsalis pedis, posterior tibial pulses bilaterally Musculoskeletal: No deformity or edema  Neurologic: Upper and lower extremity motor 5/5 and symmetric  DATA:  The patient had bilateral ABIs performed today as well as a duplex of her left lower extremity. I reviewed and interpreted this study. The femoral-femoral bypass has significantly decreased flow. Inflow to the femoral-femoral bypass from the left side is severely decreased. ABI on the right was 0.56 left 0.62 compared to greater than 1 bilaterally in January of 2014. The left lower extremity bypass was patent again with decreased flow the right leg was not examined by duplex.   ASSESSMENT:  Subtotal occlusion of femoral femoral bypass with compromise both lower extremities.  View of her previous duplex scan showed increased velocity above the femoral-femoral bypass suggestive of iliac occlusive disease.   PLAN:  Aortogram with bilateral lower extremity runoff via left brachial approach tomorrow. Possible intervention with iliac  stenting depending on the findings of this study. There is a high likelihood she may require an inflow procedure and with her marginal cardiac status this most likely would need an axillary bifemoral bypass. All this was discussed with the patient and her family today. Risks benefits possible complications and procedure details of arteriogram were explained to the patient. I will inform Dr. Lawson on the patient's current status.  Charles Fields, MD [facility] [place] Office: [phone] Pager: [phone]   

## 2013-06-25 NOTE — Telephone Encounter (Signed)
Phone call from pt.  C/o continuous pain in left 1st, 2nd, and 3rd toes and of black color on bottom of toes.  Denies open sores.  State the pain is so bad she can't stand it.  Discussed w/ Dr. Darrick Penna.  Recommends left LE art. Duplex, Bilat. ABI's and to come to office now.  Notified husband.  Agrees w/ plan.

## 2013-06-26 ENCOUNTER — Other Ambulatory Visit: Payer: Commercial Managed Care - HMO | Admitting: *Deleted

## 2013-06-26 ENCOUNTER — Encounter (HOSPITAL_COMMUNITY)
Admission: RE | Disposition: A | Discharge status: Skilled Nursing Facility | Payer: Self-pay | Source: Ambulatory Visit | Attending: Vascular Surgery

## 2013-06-26 ENCOUNTER — Inpatient Hospital Stay (HOSPITAL_COMMUNITY)
Admission: RE | Admit: 2013-06-26 | Discharge: 2013-07-02 | DRG: 253 | Disposition: A | Discharge status: Skilled Nursing Facility | Payer: Medicare Other | Source: Ambulatory Visit | Attending: Vascular Surgery | Admitting: Vascular Surgery

## 2013-06-26 ENCOUNTER — Encounter (HOSPITAL_COMMUNITY): Payer: Self-pay | Admitting: *Deleted

## 2013-06-26 DIAGNOSIS — E785 Hyperlipidemia, unspecified: Secondary | ICD-10-CM | POA: Diagnosis present

## 2013-06-26 DIAGNOSIS — I10 Essential (primary) hypertension: Secondary | ICD-10-CM | POA: Diagnosis present

## 2013-06-26 DIAGNOSIS — I70229 Atherosclerosis of native arteries of extremities with rest pain, unspecified extremity: Secondary | ICD-10-CM

## 2013-06-26 DIAGNOSIS — I2510 Atherosclerotic heart disease of native coronary artery without angina pectoris: Secondary | ICD-10-CM

## 2013-06-26 DIAGNOSIS — T82898A Other specified complication of vascular prosthetic devices, implants and grafts, initial encounter: Principal | ICD-10-CM | POA: Diagnosis present

## 2013-06-26 DIAGNOSIS — I5022 Chronic systolic (congestive) heart failure: Secondary | ICD-10-CM | POA: Diagnosis present

## 2013-06-26 DIAGNOSIS — K219 Gastro-esophageal reflux disease without esophagitis: Secondary | ICD-10-CM | POA: Diagnosis present

## 2013-06-26 DIAGNOSIS — M129 Arthropathy, unspecified: Secondary | ICD-10-CM | POA: Diagnosis present

## 2013-06-26 DIAGNOSIS — F3289 Other specified depressive episodes: Secondary | ICD-10-CM | POA: Diagnosis present

## 2013-06-26 DIAGNOSIS — F17200 Nicotine dependence, unspecified, uncomplicated: Secondary | ICD-10-CM | POA: Diagnosis present

## 2013-06-26 DIAGNOSIS — Y832 Surgical operation with anastomosis, bypass or graft as the cause of abnormal reaction of the patient, or of later complication, without mention of misadventure at the time of the procedure: Secondary | ICD-10-CM | POA: Diagnosis present

## 2013-06-26 DIAGNOSIS — I70219 Atherosclerosis of native arteries of extremities with intermittent claudication, unspecified extremity: Secondary | ICD-10-CM

## 2013-06-26 DIAGNOSIS — Z95810 Presence of automatic (implantable) cardiac defibrillator: Secondary | ICD-10-CM

## 2013-06-26 DIAGNOSIS — Z79899 Other long term (current) drug therapy: Secondary | ICD-10-CM

## 2013-06-26 DIAGNOSIS — I7092 Chronic total occlusion of artery of the extremities: Secondary | ICD-10-CM | POA: Diagnosis present

## 2013-06-26 DIAGNOSIS — F329 Major depressive disorder, single episode, unspecified: Secondary | ICD-10-CM | POA: Diagnosis present

## 2013-06-26 DIAGNOSIS — I739 Peripheral vascular disease, unspecified: Secondary | ICD-10-CM | POA: Diagnosis present

## 2013-06-26 DIAGNOSIS — I745 Embolism and thrombosis of iliac artery: Secondary | ICD-10-CM | POA: Diagnosis present

## 2013-06-26 DIAGNOSIS — I252 Old myocardial infarction: Secondary | ICD-10-CM

## 2013-06-26 DIAGNOSIS — I509 Heart failure, unspecified: Secondary | ICD-10-CM | POA: Diagnosis present

## 2013-06-26 DIAGNOSIS — G2581 Restless legs syndrome: Secondary | ICD-10-CM | POA: Diagnosis present

## 2013-06-26 DIAGNOSIS — Z9861 Coronary angioplasty status: Secondary | ICD-10-CM

## 2013-06-26 DIAGNOSIS — I2589 Other forms of chronic ischemic heart disease: Secondary | ICD-10-CM | POA: Diagnosis present

## 2013-06-26 DIAGNOSIS — Z7982 Long term (current) use of aspirin: Secondary | ICD-10-CM

## 2013-06-26 HISTORY — PX: ABDOMINAL AORTAGRAM: SHX5454

## 2013-06-26 LAB — POCT I-STAT 4, (NA,K, GLUC, HGB,HCT)
Glucose, Bld: 82 mg/dL (ref 70–99)
Hemoglobin: 14.3 g/dL (ref 12.0–15.0)
Potassium: 4.6 mEq/L (ref 3.5–5.1)

## 2013-06-26 LAB — POCT I-STAT, CHEM 8
Hemoglobin: 13.9 g/dL (ref 12.0–15.0)
Sodium: 137 mEq/L (ref 135–145)
TCO2: 25 mmol/L (ref 0–100)

## 2013-06-26 LAB — POCT ACTIVATED CLOTTING TIME: Activated Clotting Time: 130 seconds

## 2013-06-26 SURGERY — ABDOMINAL AORTAGRAM
Anesthesia: LOCAL | Laterality: Right

## 2013-06-26 MED ORDER — OMEGA-3 FATTY ACIDS 1000 MG PO CAPS: 2.0000 g | ORAL_CAPSULE | Freq: Every day | ORAL | Status: DC

## 2013-06-26 MED ORDER — ATORVASTATIN CALCIUM 20 MG PO TABS
20.0000 mg | ORAL_TABLET | Freq: Every day | ORAL | Status: DC
  Administered 2013-06-26 – 2013-07-01 (×6): 20 mg via ORAL
  Filled 2013-06-26 (×8): qty 1

## 2013-06-26 MED ORDER — HEPARIN (PORCINE) IN NACL 2-0.9 UNIT/ML-% IJ SOLN
INTRAMUSCULAR | Status: AC
  Filled 2013-06-26: qty 1000

## 2013-06-26 MED ORDER — VITAMIN D3 25 MCG (1000 UNIT) PO TABS
1000.0000 [IU] | ORAL_TABLET | Freq: Every day | ORAL | Status: DC
  Administered 2013-06-27 – 2013-07-02 (×5): 1000 [IU] via ORAL
  Filled 2013-06-26 (×7): qty 1

## 2013-06-26 MED ORDER — FERROUS SULFATE 325 (65 FE) MG PO TABS
325.0000 mg | ORAL_TABLET | Freq: Every day | ORAL | Status: DC
  Administered 2013-06-27 – 2013-07-02 (×5): 325 mg via ORAL
  Filled 2013-06-26 (×9): qty 1

## 2013-06-26 MED ORDER — FENTANYL CITRATE 0.05 MG/ML IJ SOLN
INTRAMUSCULAR | Status: AC
  Filled 2013-06-26: qty 2

## 2013-06-26 MED ORDER — LIDOCAINE HCL (PF) 1 % IJ SOLN
INTRAMUSCULAR | Status: AC
  Filled 2013-06-26: qty 30

## 2013-06-26 MED ORDER — ALUM & MAG HYDROXIDE-SIMETH 200-200-20 MG/5ML PO SUSP: 15.0000 mL | ORAL | Status: DC | PRN

## 2013-06-26 MED ORDER — FUROSEMIDE 40 MG PO TABS: 40.0000 mg | ORAL_TABLET | Freq: Two times a day (BID) | ORAL | Status: DC

## 2013-06-26 MED ORDER — ZOLPIDEM TARTRATE 5 MG PO TABS: 10.0000 mg | ORAL_TABLET | Freq: Every evening | ORAL | Status: DC | PRN

## 2013-06-26 MED ORDER — HYDROCERIN EX CREA
TOPICAL_CREAM | Freq: Two times a day (BID) | CUTANEOUS | Status: DC | PRN
  Filled 2013-06-26: qty 113

## 2013-06-26 MED ORDER — VITAMIN D 1000 UNITS PO CAPS: 1000.0000 [IU] | ORAL_CAPSULE | Freq: Every day | ORAL | Status: DC

## 2013-06-26 MED ORDER — RAMIPRIL 2.5 MG PO CAPS
2.5000 mg | ORAL_CAPSULE | Freq: Every day | ORAL | Status: DC
  Administered 2013-06-27 – 2013-07-02 (×5): 2.5 mg via ORAL
  Filled 2013-06-26 (×7): qty 1

## 2013-06-26 MED ORDER — ACETAMINOPHEN 650 MG RE SUPP: 325.0000 mg | RECTAL | Status: DC | PRN

## 2013-06-26 MED ORDER — EZETIMIBE 10 MG PO TABS
10.0000 mg | ORAL_TABLET | Freq: Every day | ORAL | Status: DC
  Administered 2013-06-27 – 2013-07-02 (×5): 10 mg via ORAL
  Filled 2013-06-26 (×7): qty 1

## 2013-06-26 MED ORDER — DEXTROSE-NACL 5-0.45 % IV SOLN
INTRAVENOUS | Status: DC
  Administered 2013-06-26: 14:00:00 via INTRAVENOUS

## 2013-06-26 MED ORDER — ONDANSETRON HCL 4 MG/2ML IJ SOLN
4.0000 mg | Freq: Four times a day (QID) | INTRAMUSCULAR | Status: DC | PRN
  Administered 2013-06-29: 4 mg via INTRAVENOUS
  Filled 2013-06-26: qty 2

## 2013-06-26 MED ORDER — LABETALOL HCL 5 MG/ML IV SOLN
10.0000 mg | INTRAVENOUS | Status: DC | PRN
  Filled 2013-06-26: qty 4

## 2013-06-26 MED ORDER — CLONAZEPAM 0.5 MG PO TABS
0.5000 mg | ORAL_TABLET | Freq: Two times a day (BID) | ORAL | Status: DC | PRN
  Administered 2013-06-30: 1 mg via ORAL
  Filled 2013-06-26: qty 2

## 2013-06-26 MED ORDER — NITROGLYCERIN 0.4 MG SL SUBL: 0.4000 mg | SUBLINGUAL_TABLET | SUBLINGUAL | Status: DC | PRN

## 2013-06-26 MED ORDER — ESTROGENS CONJUGATED 0.625 MG PO TABS
0.6250 mg | ORAL_TABLET | Freq: Every day | ORAL | Status: DC
  Administered 2013-06-27 – 2013-07-02 (×5): 0.625 mg via ORAL
  Filled 2013-06-26 (×7): qty 1

## 2013-06-26 MED ORDER — ASPIRIN 81 MG PO TABS: 81.0000 mg | ORAL_TABLET | Freq: Every day | ORAL | Status: DC

## 2013-06-26 MED ORDER — ALLOPURINOL 100 MG PO TABS
100.0000 mg | ORAL_TABLET | Freq: Every day | ORAL | Status: DC
  Administered 2013-06-27 – 2013-07-02 (×5): 100 mg via ORAL
  Filled 2013-06-26 (×7): qty 1

## 2013-06-26 MED ORDER — FUROSEMIDE 40 MG PO TABS
60.0000 mg | ORAL_TABLET | Freq: Every day | ORAL | Status: DC
  Administered 2013-06-27 – 2013-07-02 (×5): 60 mg via ORAL
  Filled 2013-06-26 (×9): qty 1

## 2013-06-26 MED ORDER — ZOLPIDEM TARTRATE 5 MG PO TABS: 5.0000 mg | ORAL_TABLET | Freq: Every evening | ORAL | Status: DC | PRN

## 2013-06-26 MED ORDER — FUROSEMIDE 40 MG PO TABS
40.0000 mg | ORAL_TABLET | Freq: Every day | ORAL | Status: DC
  Administered 2013-06-26 – 2013-07-01 (×6): 40 mg via ORAL
  Filled 2013-06-26 (×8): qty 1

## 2013-06-26 MED ORDER — HYDRALAZINE HCL 20 MG/ML IJ SOLN
10.0000 mg | INTRAMUSCULAR | Status: DC | PRN
  Administered 2013-06-29: 10 mg via INTRAVENOUS

## 2013-06-26 MED ORDER — CITALOPRAM HYDROBROMIDE 40 MG PO TABS
40.0000 mg | ORAL_TABLET | Freq: Every day | ORAL | Status: DC
  Administered 2013-06-27 – 2013-07-02 (×5): 40 mg via ORAL
  Filled 2013-06-26 (×7): qty 1

## 2013-06-26 MED ORDER — OXYCODONE-ACETAMINOPHEN 5-325 MG PO TABS
1.0000 | ORAL_TABLET | ORAL | Status: DC | PRN
Start: 2013-06-26 — End: 2013-07-01
  Administered 2013-06-26 – 2013-06-29 (×7): 2 via ORAL
  Administered 2013-06-30: 1 via ORAL
  Administered 2013-06-30 – 2013-07-01 (×4): 2 via ORAL
  Administered 2013-07-01: 1 via ORAL
  Administered 2013-07-01: 2 via ORAL
  Filled 2013-06-26 (×2): qty 2
  Filled 2013-06-26: qty 1
  Filled 2013-06-26 (×10): qty 2

## 2013-06-26 MED ORDER — ACETAMINOPHEN 325 MG PO TABS
325.0000 mg | ORAL_TABLET | ORAL | Status: DC | PRN
  Administered 2013-06-30: 650 mg via ORAL
  Filled 2013-06-26: qty 2

## 2013-06-26 MED ORDER — DOCUSATE SODIUM 100 MG PO CAPS
100.0000 mg | ORAL_CAPSULE | Freq: Every day | ORAL | Status: DC
  Administered 2013-06-27 – 2013-07-02 (×5): 100 mg via ORAL
  Filled 2013-06-26 (×7): qty 1

## 2013-06-26 MED ORDER — HEPARIN (PORCINE) IN NACL 100-0.45 UNIT/ML-% IJ SOLN
1150.0000 [IU]/h | INTRAMUSCULAR | Status: DC
  Administered 2013-06-26: 950 [IU]/h via INTRAVENOUS
  Administered 2013-06-27 (×2): 1150 [IU]/h via INTRAVENOUS
  Filled 2013-06-26 (×6): qty 250

## 2013-06-26 MED ORDER — ASPIRIN 81 MG PO CHEW
81.0000 mg | CHEWABLE_TABLET | Freq: Every day | ORAL | Status: DC
  Administered 2013-06-27 – 2013-07-02 (×5): 81 mg via ORAL
  Filled 2013-06-26 (×5): qty 1

## 2013-06-26 MED ORDER — HEPARIN SODIUM (PORCINE) 1000 UNIT/ML IJ SOLN
INTRAMUSCULAR | Status: AC
  Filled 2013-06-26: qty 1

## 2013-06-26 MED ORDER — METOPROLOL SUCCINATE ER 50 MG PO TB24
50.0000 mg | ORAL_TABLET | Freq: Every day | ORAL | Status: DC
  Administered 2013-06-27 – 2013-07-02 (×5): 50 mg via ORAL
  Filled 2013-06-26 (×6): qty 1

## 2013-06-26 MED ORDER — PANTOPRAZOLE SODIUM 40 MG PO TBEC
40.0000 mg | DELAYED_RELEASE_TABLET | Freq: Every day | ORAL | Status: DC
  Administered 2013-06-27 – 2013-07-02 (×5): 40 mg via ORAL
  Filled 2013-06-26 (×5): qty 1

## 2013-06-26 MED ORDER — PANTOPRAZOLE SODIUM 40 MG PO TBEC: 40.0000 mg | DELAYED_RELEASE_TABLET | Freq: Every day | ORAL | Status: DC

## 2013-06-26 MED ORDER — METOPROLOL TARTRATE 1 MG/ML IV SOLN: 2.0000 mg | INTRAVENOUS | Status: DC | PRN

## 2013-06-26 MED ORDER — MORPHINE SULFATE 2 MG/ML IJ SOLN
INTRAMUSCULAR | Status: AC
  Filled 2013-06-26: qty 1

## 2013-06-26 MED ORDER — SODIUM CHLORIDE 0.9 % IV SOLN
INTRAVENOUS | Status: DC
  Administered 2013-06-26: 100 mL/h via INTRAVENOUS

## 2013-06-26 MED ORDER — OMEGA-3-ACID ETHYL ESTERS 1 G PO CAPS
2.0000 g | ORAL_CAPSULE | Freq: Every day | ORAL | Status: DC
  Administered 2013-06-27 – 2013-07-02 (×5): 2 g via ORAL
  Filled 2013-06-26 (×7): qty 2

## 2013-06-26 MED ORDER — POTASSIUM CHLORIDE CRYS ER 20 MEQ PO TBCR
20.0000 meq | EXTENDED_RELEASE_TABLET | ORAL | Status: DC
  Administered 2013-06-26 – 2013-07-01 (×2): 20 meq via ORAL
  Filled 2013-06-26 (×3): qty 1

## 2013-06-26 MED ORDER — MORPHINE SULFATE 10 MG/ML IJ SOLN
2.0000 mg | INTRAMUSCULAR | Status: DC | PRN
  Administered 2013-06-26 – 2013-06-27 (×3): 2 mg via INTRAVENOUS
  Administered 2013-06-29: 4 mg via INTRAVENOUS
  Filled 2013-06-26 (×2): qty 1

## 2013-06-26 MED ORDER — EUCERIN EX CREA: 1.0000 "application " | TOPICAL_CREAM | Freq: Two times a day (BID) | CUTANEOUS | Status: DC | PRN

## 2013-06-26 NOTE — Op Note (Signed)
Procedure: Aortogram with bilateral lower extremity runoff, arch aortogram  Preoperative diagnosis: Rest pain left foot, ischemia  Postoperative diagnosis: Same   Anesthesia: Local with IV sedation  Operative details: After obtaining informed consent, the patient was taken to the PV lab. The patient was placed in supine position the Angio table. Both groins and the left upper extremity were prepped and draped in usual sterile fashion. Ultrasound was used to identify the left brachial artery. Local anesthesia was infiltrated over the left brachial artery near the antecubital area. Several attempts were made to cannulate the left brachial artery and these were unsuccessful. The patient had a small hematoma developing near the antecubital area of the left arm. Attempts to cannulate the left brachial artery were abandoned. Attention was then turned to the left groin. Ultrasound was used to identify the left common femoral artery. Local anesthesia was infiltrated over the left common femoral artery. A micropuncture needle was then used to successfully cannulate the left common femoral artery. The micropuncture wire was advanced through the needle up in the left iliac system. The guidewire would not advance fully into the left iliac system. The micropuncture sheath was placed over the guidewire into the left common femoral artery. A contrast angiogram was obtained. This showed distal occlusion of the left external iliac artery with a patent left to right femoral-femoral bypass. At this point the left micropuncture sheath was hep-locked. Attention was then turned to the right upper extremity. The right upper extremity was prepped and draped in usual sterile fashion. Local anesthesia was infiltrated over the brachial artery in the right antecubital area. An micropuncture needle was then used to directly cannulate the right brachial artery. There was good backbleeding. The micropuncture wire was threaded up into the  right brachial artery under fluoroscopic guidance. The micropuncture sheath was then placed over the guidewire into the right brachial artery. Contrast was injected to confirm her in the brachial artery. An 3 versacore wire was then threaded through the micropuncture sheath and micropuncture sheath exchanged for a 5 French sheath. A solution of 200 mcg of nitroglycerin and 2000 units of heparin was injected through the sheath. Next a 5 [place] KMP catheter was used to advance over the guidewire and give extra support to direct the guidewire into the innominate artery followed by the descending thoracic aorta. A 5 French pigtail catheter was then exchanged over the guidewire after removing the KMP catheter. The pigtail catheter was advanced into the abdominal aorta at the level of the 12th rib. An abdominal aortogram was obtained. The left and right renal arteries are patent. The infrarenal abdominal aorta is patent. The left and right common iliac arteries are patent. The left and right external iliac arteries are occluded. The left and right internal iliac arteries are patent. There is delayed filling of the femoral-femoral bypass via pelvic collaterals from the left internal iliac artery. The femoral-femoral bypass flows left to right. At this point the pigtail catheter was advanced down to the level of the aortic bifurcation. Oblique views of the pelvis were obtained through the pigtail catheter to confirm the above findings. Next low extremity runoff views were performed. This was also done through the pigtail catheter.  In the right lower extremity, the right common femoral artery is patent.  There is a femoral-femoral bypass which terminates in the right common femoral artery. The right profunda femoris artery is small but patent. There is a right femoral to below-knee popliteal bypass which is patent. There appears to be  three-vessel runoff to the right foot however contrast opacification distally is poor  view to inadequate inflow.  In the left lower extremity, the left to right femoral-femoral bypass is patent. The left common femoral artery is patent. The left profunda femoris artery is patent. There is a left superficial femoral artery to posterior tibial artery bypass which is patent. There is retrograde filling of the posterior tibial artery which fills a portion of the peroneal artery. Posterior tibial artery is the dominant runoff vessel to the left foot.    At this point the pigtail catheter was pulled back up into the descending aorta. A contrast angiogram was obtained from the arch to view the subclavian and axillary artery on the right side. The innominate right subclavian right common carotid origin right axillary artery are all patent. There is no significant stenosis. The left subclavian and left common carotid origins are also patent without significant stenosis.  At this point the pigtail catheter was removed over a guidewire. The micropuncture sheath was left in the left groin and the 5 French sheath left in the right brachial artery to be pulled in the holding area. The patient had no hematoma in the left antecubital area on leaving the PV lab. The patient tolerated the procedure well and there were no complications. The patient was taken to the holding area in stable condition.  Operative findings: Bilateral external iliac artery occlusions with patent left to right femoral-femoral bypass that by internal iliac collaterals, patent left superficial femoral to posterior tibial artery bypass, patent right femoral to below-knee popliteal bypass. Two-vessel runoff to the left foot primarily PT dominant. Three-vessel runoff to the right foot but poorly opacified. Patent right subclavian and axillary artery.  Operative management: The patient will be admitted for heparin today. The plan will be to hydrate her from her contrast load today. She will then be placed on a heparin drip over the  weekend. A right axillary femoral bypass is planned for October 13.  Fabienne Bruns, MD [facility] [place] Office: [phone] Pager: [phone]

## 2013-06-26 NOTE — Progress Notes (Signed)
Received patient from recovery, vital signs stable upon arrival.  Patient instructed to lie flat with left leg straight.  No complaints of pain at this time.  Family at bedside, patient oriented to room and environment.  No signs of bleeding or hematoma at sites.

## 2013-06-26 NOTE — Progress Notes (Signed)
ANTICOAGULATION CONSULT NOTE - Initial Consult  Pharmacy Consult for Heparin Indication: PVD  Allergies  Allergen Reactions  . Naproxen Other (See Comments)    Blurred vision  . Prednisone Other (See Comments)    Mean to her grandbabies    Patient Measurements: Height: 5' 3.5" (161.3 cm) Weight: 137 lb (62.143 kg) IBW/kg (Calculated) : 53.55  Vital Signs: Temp: 98 F (36.7 C) (10/10 1409) Temp src: Oral (10/10 1409) BP: 128/82 mmHg (10/10 1409) Pulse Rate: 72 (10/10 1409)  Labs:  Recent Labs  06/26/13 0921 06/26/13 0942  HGB 14.3 13.9  HCT 42.0 41.0  CREATININE  --  1.50*    Estimated Creatinine Clearance: 31.2 ml/min (by C-G formula based on Cr of 1.5).   Medical History: Past Medical History  Diagnosis Date  . PVD (peripheral vascular disease)     status post prior femoral-popliteal bypass  . Back pain   . Arthritis   . CAD (coronary artery disease)     status post prior stenting to the proximal RCA, status post anterior STEMI 10/2008 (RCA occluded at the prior stent, LAD treated with a bare metal stent and a drug-eluting stent),  . Chronic systolic heart failure     echo 11/2011: EF 25-30%, AS and apical AK, trivial AI, mild MR, PASP 34.  . Anemia   . Ischemic cardiomyopathy   . AICD (automatic cardioverter/defibrillator) present   . HLD (hyperlipidemia)   . GERD (gastroesophageal reflux disease)   . RLS (restless legs syndrome)   . Depression   . Tobacco abuse   . Pain in joint, lower leg   . Depressive disorder, not elsewhere classified   . Myocardial infarction 2010    Assessment: 67 year old female s/p aortogram today who presented for evaluation of pain in the left foot with bluish discoloration of the left foot.  History of multiple bypass surgeries in the past.  Pharmacy asked to begin heparin for a planned right axillary femoral bypass planned for October 13.  With renal dysfunction.  Goal of Therapy:  Heparin level 0.3-0.7 units/ml Monitor  platelets by anticoagulation protocol: Yes   Plan:  1) No heparin bolus 2) Heparin drip at 950 units / hr 3) Heparin level 8 hours after heparin begins 4) Daily heparin level, CBC  Thank you. Okey Regal, PharmD [phone]  06/26/2013,2:59 PM

## 2013-06-26 NOTE — Interval H&P Note (Signed)
History and Physical Interval Note:  06/26/2013 10:21 AM  [name]  has presented today for surgery, with the diagnosis of PVD  The various methods of treatment have been discussed with the patient and family. After consideration of risks, benefits and other options for treatment, the patient has consented to  Procedure(s): ABDOMINAL AORTAGRAM (Bilateral) as a surgical intervention .  The patient's history has been reviewed, patient examined, no change in status, stable for surgery.  I have reviewed the patient's chart and labs.  Questions were answered to the patient's satisfaction.     Barbette Mcglaun E

## 2013-06-26 NOTE — Interval H&P Note (Signed)
History and Physical Interval Note:  06/26/2013 10:52 AM  [name]  has presented today for surgery, with the diagnosis of PVD  The various methods of treatment have been discussed with the patient and family. After consideration of risks, benefits and other options for treatment, the patient has consented to  Procedure(s): ABDOMINAL AORTAGRAM (Bilateral) as a surgical intervention .  The patient's history has been reviewed, patient examined, no change in status, stable for surgery.  I have reviewed the patient's chart and labs.  Questions were answered to the patient's satisfaction.     [name]

## 2013-06-26 NOTE — H&P (View-Only) (Signed)
[facility] [place] HISTORY AND PHYSICAL   History of Present Illness:  Patient is a 67 y.o. year old female who presents for evaluation of pain in the left foot with bluish discoloration of left foot. The patient started having symptoms of pain in her left foot 4-5 days ago. The foot hurts continuously. She has no history of ulcerations on the feet. The patient has an extensive vascular reconstructive history. In 2002 she had a left superficial femoral artery to posterior tibial artery bypass with vein. In 2006 she had a right femoral to below-knee popliteal bypass with vein. In 2010 she had a left-to-right femoral-femoral bypass. These were all done by Dr. Hart Rochester. The patient was recently seen in January. At that time her ABIs were normal bilaterally. Other medical problems include coronary artery disease, congestive failure with last ejection fraction 25-30%, AICD. Her cardiac status overall has been stable recently. Unfortunately the patient continues to smoke one and a half pack of cigarettes per day.  Past Medical History  Diagnosis Date  . PVD (peripheral vascular disease)     status post prior femoral-popliteal bypass  . Back pain   . Arthritis   . CAD (coronary artery disease)     status post prior stenting to the proximal RCA, status post anterior STEMI 10/2008 (RCA occluded at the prior stent, LAD treated with a bare metal stent and a drug-eluting stent),  . Chronic systolic heart failure     echo 11/2011: EF 25-30%, AS and apical AK, trivial AI, mild MR, PASP 34.  . Anemia   . Ischemic cardiomyopathy   . AICD (automatic cardioverter/defibrillator) present   . HLD (hyperlipidemia)   . GERD (gastroesophageal reflux disease)   . RLS (restless legs syndrome)   . Depression   . Tobacco abuse   . Pain in joint, lower leg   . Depressive disorder, not elsewhere classified   . Myocardial infarction 2010    Past Surgical History  Procedure Laterality Date  . Scientist, research (medical)    . Foot surgery    . Pr vein bypass graft,aorto-fem-pop      fem fem  04/07/09  . Abdominal hysterectomy    . Cardiac defibrillator placement    . Eye surgery  2013    Cataract bilateral     Social History History  Substance Use Topics  . Smoking status: Current Every Day Smoker -- 1.00 packs/day for 55 years    Types: Cigarettes  . Smokeless tobacco: Never Used  . Alcohol Use: No    Family History Family History  Problem Relation Age of Onset  . Peripheral vascular disease Sister   . Heart disease Brother   . Diabetes Mother   . Heart attack Father     Allergies  Allergies  Allergen Reactions  . Naproxen Other (See Comments)    Blurred vision  . Prednisone Other (See Comments)    Mean to her grandbabies     Current Outpatient Prescriptions  Medication Sig Dispense Refill  . allopurinol (ZYLOPRIM) 100 MG tablet Take 100 mg by mouth daily.        Marland Kitchen aspirin 81 MG tablet Take 81 mg by mouth daily.        . Cholecalciferol (VITAMIN D) 1000 UNITS capsule Take 1,000 Units by mouth daily.        . citalopram (CELEXA) 40 MG tablet Take 40 mg by mouth daily.      . clonazePAM (KLONOPIN) 1 MG tablet Take 0.5-2  mg by mouth 2 (two) times daily as needed. Take 1 tablet in the morning and 2 tablets in the evening      . estrogens, conjugated, (PREMARIN) 0.625 MG tablet Take 0.625 mg by mouth daily. Take daily for 21 days then do not take for 7 days.      Marland Kitchen ezetimibe (ZETIA) 10 MG tablet Take 10 mg by mouth daily.        . ferrous sulfate 325 (65 FE) MG tablet Take 325 mg by mouth daily with breakfast.        . fish oil-omega-3 fatty acids 1000 MG capsule Take 2 g by mouth daily.      . furosemide (LASIX) 40 MG tablet TAKE 1&1/2 TABLETS IN THE MORNING AND 1 IN THE EVENING  225 tablet  1  . HYDROcodone-acetaminophen (VICODIN) 5-500 MG per tablet Take 1 tablet by mouth as directed.        . metoprolol succinate (TOPROL-XL) 50 MG 24 hr tablet Take 1 tablet (50 mg total) by  mouth daily.  90 tablet  3  . metoprolol succinate (TOPROL-XL) 50 MG 24 hr tablet TAKE 1 TABLET BY MOUTH DAILY  30 tablet  9  . nitroGLYCERIN (NITROSTAT) 0.4 MG SL tablet Place 1 tablet (0.4 mg total) under the tongue every 5 (five) minutes as needed. For chest pain  25 tablet  2  . pantoprazole (PROTONIX) 40 MG tablet Take 40 mg by mouth daily.        . potassium chloride SA (K-DUR,KLOR-CON) 20 MEQ tablet TAKE 1 TABLET BY MOUTH ON MONDAYS,WEDNESDAYS, AND FRIDAYS  90 tablet  2  . ramipril (ALTACE) 2.5 MG capsule TAKE ONE CAPSULE BY MOUTH DAILY  90 capsule  2  . rosuvastatin (CRESTOR) 20 MG tablet Take 1 tablet (20 mg total) by mouth daily.  30 tablet  9  . Skin Protectants, Misc. (EUCERIN) cream Apply 1 application topically 2 (two) times daily as needed.      . zolpidem (AMBIEN) 10 MG tablet Take 10 mg by mouth at bedtime as needed.        . [DISCONTINUED] mirtazapine (REMERON) 15 MG tablet Take 15 mg by mouth as needed.        . [DISCONTINUED] ranitidine (ZANTAC) 300 MG tablet Take 300 mg by mouth 2 (two) times daily.         No current facility-administered medications for this visit.    ROS:   General:  No weight loss, Fever, chills  HEENT: No recent headaches, no nasal bleeding, no visual changes, no sore throat  Neurologic: No dizziness, blackouts, seizures. No recent symptoms of stroke or mini- stroke. No recent episodes of slurred speech, or temporary blindness.  Cardiac: No recent episodes of chest pain/pressure, no shortness of breath at rest.  + shortness of breath with exertion.  Denies history of atrial fibrillation or irregular heartbeat  Vascular: + history of rest pain in feet.  No history of claudication.  No history of non-healing ulcer, No history of DVT   Pulmonary: No home oxygen, no productive cough, no hemoptysis,  No asthma or wheezing  Musculoskeletal:  [ ]  Arthritis, [ ]  Low back pain,  [ ]  Joint pain  Hematologic:No history of hypercoagulable state.  No  history of easy bleeding.  No history of anemia  Gastrointestinal: No hematochezia or melena,  No gastroesophageal reflux, no trouble swallowing  Urinary: [ ]  chronic Kidney disease, [ ]  on HD - [ ]  MWF or [ ]  TTHS, [ ]   Burning with urination, [ ]  Frequent urination, [ ]  Difficulty urinating;   Skin: No rashes  Psychological: No history of anxiety,  No history of depression   Physical Examination  Filed Vitals:   06/25/13 1433  BP: 151/81  Pulse: 74  Height: 5\' 4"  (1.626 m)  Weight: 138 lb (62.596 kg)  SpO2: 100%    Body mass index is 23.68 kg/(m^2).  General:  Alert and oriented, no acute distress HEENT: Normal Neck: No bruit or JVD Pulmonary: Clear to auscultation bilaterally Cardiac: Regular Rate and Rhythm without murmur Abdomen: Soft, non-tender, non-distended, no mass Skin: No rash, both feet are dusky and symmetric in appearance Extremity Pulses:  2+ radial, brachial, absent femoral, dorsalis pedis, posterior tibial pulses bilaterally Musculoskeletal: No deformity or edema  Neurologic: Upper and lower extremity motor 5/5 and symmetric  DATA:  The patient had bilateral ABIs performed today as well as a duplex of her left lower extremity. I reviewed and interpreted this study. The femoral-femoral bypass has significantly decreased flow. Inflow to the femoral-femoral bypass from the left side is severely decreased. ABI on the right was 0.56 left 0.62 compared to greater than 1 bilaterally in January of 2014. The left lower extremity bypass was patent again with decreased flow the right leg was not examined by duplex.   ASSESSMENT:  Subtotal occlusion of femoral femoral bypass with compromise both lower extremities.  View of her previous duplex scan showed increased velocity above the femoral-femoral bypass suggestive of iliac occlusive disease.   PLAN:  Aortogram with bilateral lower extremity runoff via left brachial approach tomorrow. Possible intervention with iliac  stenting depending on the findings of this study. There is a high likelihood she may require an inflow procedure and with her marginal cardiac status this most likely would need an axillary bifemoral bypass. All this was discussed with the patient and her family today. Risks benefits possible complications and procedure details of arteriogram were explained to the patient. I will inform Dr. Hart Rochester on the patient's current status.  Fabienne Bruns, MD [facility] [place] Office: [phone] Pager: [phone]

## 2013-06-27 DIAGNOSIS — I771 Stricture of artery: Secondary | ICD-10-CM

## 2013-06-27 LAB — CBC
HCT: 35.3 % — ABNORMAL LOW (ref 36.0–46.0)
Hemoglobin: 11.5 g/dL — ABNORMAL LOW (ref 12.0–15.0)
MCH: 31.3 pg (ref 26.0–34.0)
MCHC: 32.6 g/dL (ref 30.0–36.0)
Platelets: 157 10*3/uL (ref 150–400)
RDW: 15.3 % (ref 11.5–15.5)
WBC: 10.2 10*3/uL (ref 4.0–10.5)

## 2013-06-27 LAB — HEPARIN LEVEL (UNFRACTIONATED)
Heparin Unfractionated: 0.1 IU/mL — ABNORMAL LOW (ref 0.30–0.70)
Heparin Unfractionated: 0.49 IU/mL (ref 0.30–0.70)

## 2013-06-27 NOTE — Progress Notes (Signed)
ANTICOAGULATION CONSULT NOTE - Follow Up Consult  Pharmacy Consult for heparin Indication: PVD  Labs:  Recent Labs  06/26/13 0921 06/26/13 0942 06/27/13 0055 06/27/13 1010  HGB 14.3 13.9 11.5*  --   HCT 42.0 41.0 35.3*  --   PLT  --   --  157  --   HEPARINUNFRC  --   --  0.10* 0.49  CREATININE  --  1.50*  --   --     Assessment: 67yo female therapeutic on heparin, awaiting fem bypass.  Goal of Therapy:  Heparin level 0.3-0.7 units/ml   Plan:  1) Continue heparin at 1150 units / hr 2) Follow up AM  Thank you. Okey Regal, PharmD [phone]  06/27/2013,11:05 AM

## 2013-06-27 NOTE — Progress Notes (Signed)
ANTICOAGULATION CONSULT NOTE - Follow Up Consult  Pharmacy Consult for heparin Indication: PVD  Labs:  Recent Labs  06/26/13 0921 06/26/13 0942 06/27/13 0055  HGB 14.3 13.9 11.5*  HCT 42.0 41.0 35.3*  PLT  --   --  157  HEPARINUNFRC  --   --  0.10*  CREATININE  --  1.50*  --     Assessment: 67yo female subtherapeutic on heparin with initial dosing post-op, awaiting fem bypass.  Goal of Therapy:  Heparin level 0.3-0.7 units/ml   Plan:  Will increase heparin gtt by 3 units/kg/hr to 1150 units/hr and check level in 8hr.  Vernard Gambles, PharmD, BCPS  06/27/2013,2:10 AM

## 2013-06-27 NOTE — Progress Notes (Addendum)
[facility] [place]  Subjective  - doing well over all.  No new complaints.   Objective 130/75 79 98 F (36.7 C) (Oral) 18 93%  Intake/Output Summary (Last 24 hours) at 06/27/13 0908 Last data filed at 06/26/13 1732  Gross per 24 hour  Intake    285 ml  Output      0 ml  Net    285 ml   Left groin soft no active bleeding dressing clean and dry.  No edema or hematoma. Feet warm to touch   Assessment/Planning: Procedure(s): ABDOMINAL AORTAGRAM ANGIOGRAM EXTREMITY BILATERAL BILATERAL UPPER EXTREMITY ANGIOGRAM  1 Day Post-OpSurgeon(s): Sherren Kerns, MD  Plan: Right axillary femoral bypass is planned for October 13. Will pre-op tomorrow.    Clinton Gallant [facility] 06/27/2013 9:08 AM --  Laboratory Lab Results:  Recent Labs  06/26/13 0942 06/27/13 0055  WBC  --  10.2  HGB 13.9 11.5*  HCT 41.0 35.3*  PLT  --  157   BMET  Recent Labs  06/26/13 0921 06/26/13 0942  NA 137 137  K 4.6 4.6  CL  --  106  GLUCOSE 82 78  BUN  --  35*  CREATININE  --  1.50*    COAG Lab Results  Component Value Date   INR 1.0 ratio 06/01/2009   INR 1.0 04/05/2009   INR 1.0 11/02/2008   No results found for this basename: PTT   I agree with the above.  The patient is status post angiography yesterday via multiple access sites.  Her left third toe remains bluish in discoloration.  Her access sites are soft without evidence of hematoma, however there is residual ecchymosis.  She seems to be very comfortable on a heparin drip.  Plan is for a right asked-M. bypass graft on Monday.  The risks of this procedure were discussed with the patient including the risk of infection.  [name]

## 2013-06-28 LAB — CBC
HCT: 34.2 % — ABNORMAL LOW (ref 36.0–46.0)
Hemoglobin: 11.2 g/dL — ABNORMAL LOW (ref 12.0–15.0)
MCH: 31.9 pg (ref 26.0–34.0)
MCHC: 33.3 g/dL (ref 30.0–36.0)
MCV: 95.8 fL (ref 78.0–100.0)
Platelets: 151 10*3/uL (ref 150–400)
Platelets: 154 10*3/uL (ref 150–400)
RBC: 3.57 MIL/uL — ABNORMAL LOW (ref 3.87–5.11)
RBC: 3.51 MIL/uL — ABNORMAL LOW (ref 3.87–5.11)
RDW: 15.2 % (ref 11.5–15.5)
WBC: 13 10*3/uL — ABNORMAL HIGH (ref 4.0–10.5)

## 2013-06-28 LAB — COMPREHENSIVE METABOLIC PANEL
ALT: 32 U/L (ref 0–35)
AST: 22 U/L (ref 0–37)
BUN: 19 mg/dL (ref 6–23)
CO2: 23 mEq/L (ref 19–32)
Calcium: 8.7 mg/dL (ref 8.4–10.5)
Creatinine, Ser: 1.12 mg/dL — ABNORMAL HIGH (ref 0.50–1.10)
GFR calc Af Amer: 58 mL/min — ABNORMAL LOW (ref >90)
GFR calc non Af Amer: 50 mL/min — ABNORMAL LOW (ref >90)
Potassium: 3.5 mEq/L (ref 3.5–5.1)
Sodium: 132 mEq/L — ABNORMAL LOW (ref 135–145)
Total Bilirubin: 0.2 mg/dL — ABNORMAL LOW (ref 0.3–1.2)
Total Protein: 6.2 g/dL (ref 6.0–8.3)

## 2013-06-28 LAB — SURGICAL PCR SCREEN
MRSA, PCR: NEGATIVE
Staphylococcus aureus: NEGATIVE

## 2013-06-28 LAB — HEPARIN LEVEL (UNFRACTIONATED): Heparin Unfractionated: 0.59 IU/mL (ref 0.30–0.70)

## 2013-06-28 MED ORDER — DEXTROSE-NACL 5-0.45 % IV SOLN
INTRAVENOUS | Status: DC
  Administered 2013-06-28: 10:00:00 via INTRAVENOUS

## 2013-06-28 MED ORDER — CEFAZOLIN SODIUM 1-5 GM-% IV SOLN
1.0000 g | INTRAVENOUS | Status: AC
  Administered 2013-06-29: 1 g via INTRAVENOUS
  Filled 2013-06-28: qty 50

## 2013-06-28 NOTE — Progress Notes (Addendum)
[facility] [place]  Subjective  - No new complaints other than left arm bruising from IV infiltrate.   Objective 147/77 69 97.5 F (36.4 C) (Oral) 18 99%  Intake/Output Summary (Last 24 hours) at 06/28/13 0816 Last data filed at 06/27/13 2300  Gross per 24 hour  Intake 755.19 ml  Output      0 ml  Net 755.19 ml   Left groin soft no active bleeding dressing clean and dry. No edema or hematoma.  Feet warm to touch Left antecubital area with edema and ecchymosis secondary to IV infiltrate.  N/V/M intact.   Assessment/Planning: Procedure(s): ABDOMINAL AORTAGRAM ANGIOGRAM EXTREMITY BILATERAL BILATERAL UPPER EXTREMITY ANGIOGRAM  2 Days Post-OpSurgeon(s): Sherren Kerns, MD Plan Right axillary femoral bypass is planned for October 13. NPO      [name] 06/28/2013 8:16 AM --  Laboratory Lab Results:  Recent Labs  06/27/13 0055 06/28/13 0425  WBC 10.2 13.0*  HGB 11.5* 11.4*  HCT 35.3* 34.2*  PLT 157 151   BMET  Recent Labs  06/26/13 0921 06/26/13 0942  NA 137 137  K 4.6 4.6  CL  --  106  GLUCOSE 82 78  BUN  --  35*  CREATININE  --  1.50*    COAG Lab Results  Component Value Date   INR 1.0 ratio 06/01/2009   INR 1.0 04/05/2009   INR 1.0 11/02/2008   No results found for this basename: PTT   I agree with the above.  The patient continues to complain of tingling and burning in both feet, right greater than left.  I discussed the details of the operation tomorrow which will be a redo right femoral exposure and a right axillary femoral bypass.  All her questions were answered.  She'll be n.p.o. after midnight.  I will check blood work in the morning.  Durene Cal

## 2013-06-28 NOTE — Progress Notes (Signed)
ANTICOAGULATION CONSULT NOTE - Follow Up Consult  Pharmacy Consult for heparin Indication: PVD  Labs:  Recent Labs  06/26/13 0942 06/27/13 0055 06/27/13 1010 06/28/13 0425  HGB 13.9 11.5*  --  11.4*  HCT 41.0 35.3*  --  34.2*  PLT  --  157  --  151  HEPARINUNFRC  --  0.10* 0.49 0.59  CREATININE 1.50*  --   --   --     Assessment: 67yo female therapeutic on heparin, awaiting fem bypass.  Goal of Therapy:  Heparin level 0.3-0.7 units/ml   Plan:  1) Continue heparin at 1150 units / hr 2) Follow up AM  Thank you. Okey Regal, PharmD [phone]  06/28/2013,11:26 AM

## 2013-06-29 ENCOUNTER — Inpatient Hospital Stay (HOSPITAL_COMMUNITY): Payer: Medicare Other | Admitting: Anesthesiology

## 2013-06-29 ENCOUNTER — Encounter (HOSPITAL_COMMUNITY): Payer: Medicare Other | Admitting: Anesthesiology

## 2013-06-29 ENCOUNTER — Encounter (HOSPITAL_COMMUNITY)
Admission: RE | Disposition: A | Discharge status: Skilled Nursing Facility | Payer: Self-pay | Source: Ambulatory Visit | Attending: Vascular Surgery

## 2013-06-29 ENCOUNTER — Telehealth: Payer: Self-pay | Admitting: Vascular Surgery

## 2013-06-29 ENCOUNTER — Encounter (HOSPITAL_COMMUNITY): Payer: Self-pay | Admitting: Anesthesiology

## 2013-06-29 ENCOUNTER — Inpatient Hospital Stay: Admit: 2013-06-29 | Payer: Medicare Other | Admitting: Vascular Surgery

## 2013-06-29 ENCOUNTER — Inpatient Hospital Stay (HOSPITAL_COMMUNITY): Payer: Medicare Other

## 2013-06-29 HISTORY — PX: AXILLARY-FEMORAL BYPASS GRAFT: SHX894

## 2013-06-29 LAB — BASIC METABOLIC PANEL
BUN: 17 mg/dL (ref 6–23)
Calcium: 9.1 mg/dL (ref 8.4–10.5)
Chloride: 99 mEq/L (ref 96–112)
Creatinine, Ser: 1.09 mg/dL (ref 0.50–1.10)
GFR calc Af Amer: 60 mL/min — ABNORMAL LOW (ref >90)
Sodium: 135 mEq/L (ref 135–145)

## 2013-06-29 LAB — CBC
Hemoglobin: 11.3 g/dL — ABNORMAL LOW (ref 12.0–15.0)
MCH: 31.4 pg (ref 26.0–34.0)
MCHC: 32.8 g/dL (ref 30.0–36.0)
MCV: 95.6 fL (ref 78.0–100.0)
Platelets: 162 10*3/uL (ref 150–400)
RDW: 15.1 % (ref 11.5–15.5)
WBC: 15.7 10*3/uL — ABNORMAL HIGH (ref 4.0–10.5)

## 2013-06-29 LAB — PROTIME-INR: Prothrombin Time: 12.4 seconds (ref 11.6–15.2)

## 2013-06-29 LAB — HEPARIN LEVEL (UNFRACTIONATED): Heparin Unfractionated: 0.45 IU/mL (ref 0.30–0.70)

## 2013-06-29 SURGERY — CREATION, BYPASS, ARTERIAL, AXILLARY TO BILATERAL FEMORAL, USING GRAFT
Anesthesia: General | Site: Chest | Laterality: Right | Wound class: Clean

## 2013-06-29 MED ORDER — PROPOFOL 10 MG/ML IV BOLUS
INTRAVENOUS | Status: DC | PRN
  Administered 2013-06-29: 80 mg via INTRAVENOUS

## 2013-06-29 MED ORDER — FENTANYL CITRATE 0.05 MG/ML IJ SOLN: 50.0000 ug | INTRAMUSCULAR | Status: DC | PRN

## 2013-06-29 MED ORDER — PROTAMINE SULFATE 10 MG/ML IV SOLN
INTRAVENOUS | Status: DC | PRN
  Administered 2013-06-29 (×5): 10 mg via INTRAVENOUS

## 2013-06-29 MED ORDER — HEPARIN SODIUM (PORCINE) 1000 UNIT/ML IJ SOLN
INTRAMUSCULAR | Status: DC | PRN
  Administered 2013-06-29: 6000 [IU] via INTRAVENOUS

## 2013-06-29 MED ORDER — FENTANYL CITRATE 0.05 MG/ML IJ SOLN
25.0000 ug | INTRAMUSCULAR | Status: DC | PRN
  Administered 2013-06-29 (×2): 50 ug via INTRAVENOUS

## 2013-06-29 MED ORDER — FENTANYL CITRATE 0.05 MG/ML IJ SOLN
INTRAMUSCULAR | Status: AC
  Filled 2013-06-29: qty 2

## 2013-06-29 MED ORDER — DROPERIDOL 2.5 MG/ML IJ SOLN: 0.6250 mg | INTRAMUSCULAR | Status: DC | PRN

## 2013-06-29 MED ORDER — OXYCODONE-ACETAMINOPHEN 5-325 MG PO TABS
ORAL_TABLET | ORAL | Status: AC
  Filled 2013-06-29: qty 2

## 2013-06-29 MED ORDER — ARTIFICIAL TEARS OP OINT
TOPICAL_OINTMENT | OPHTHALMIC | Status: DC | PRN
  Administered 2013-06-29: 1 via OPHTHALMIC

## 2013-06-29 MED ORDER — NEOSTIGMINE METHYLSULFATE 1 MG/ML IJ SOLN
INTRAMUSCULAR | Status: DC | PRN
  Administered 2013-06-29: 4 mg via INTRAVENOUS

## 2013-06-29 MED ORDER — FENTANYL CITRATE 0.05 MG/ML IJ SOLN
INTRAMUSCULAR | Status: DC | PRN
  Administered 2013-06-29: 50 ug via INTRAVENOUS
  Administered 2013-06-29: 150 ug via INTRAVENOUS
  Administered 2013-06-29: 50 ug via INTRAVENOUS

## 2013-06-29 MED ORDER — MIDAZOLAM HCL 2 MG/2ML IJ SOLN
INTRAMUSCULAR | Status: AC
  Filled 2013-06-29: qty 2

## 2013-06-29 MED ORDER — MIDAZOLAM HCL 2 MG/2ML IJ SOLN
0.5000 mg | INTRAMUSCULAR | Status: DC | PRN
  Administered 2013-06-29: 2 mg via INTRAVENOUS
  Administered 2013-06-29: 1 mg via INTRAVENOUS

## 2013-06-29 MED ORDER — LACTATED RINGERS IV SOLN
INTRAVENOUS | Status: DC | PRN
  Administered 2013-06-29: 08:00:00 via INTRAVENOUS

## 2013-06-29 MED ORDER — HYDRALAZINE HCL 20 MG/ML IJ SOLN
INTRAMUSCULAR | Status: AC
  Filled 2013-06-29: qty 1

## 2013-06-29 MED ORDER — SODIUM CHLORIDE 0.9 % IR SOLN
Status: DC | PRN
  Administered 2013-06-29: 10:00:00

## 2013-06-29 MED ORDER — MIDAZOLAM HCL 5 MG/5ML IJ SOLN
INTRAMUSCULAR | Status: DC | PRN
  Administered 2013-06-29 (×2): 1 mg via INTRAVENOUS

## 2013-06-29 MED ORDER — GLYCOPYRROLATE 0.2 MG/ML IJ SOLN
INTRAMUSCULAR | Status: DC | PRN
  Administered 2013-06-29: 0.6 mg via INTRAVENOUS

## 2013-06-29 MED ORDER — DEXTROSE 5 % IV SOLN
INTRAVENOUS | Status: DC | PRN
  Administered 2013-06-29: 08:00:00 via INTRAVENOUS

## 2013-06-29 MED ORDER — MORPHINE SULFATE 4 MG/ML IJ SOLN
INTRAMUSCULAR | Status: AC
  Administered 2013-06-29: 4 mg
  Filled 2013-06-29: qty 1

## 2013-06-29 MED ORDER — ROCURONIUM BROMIDE 100 MG/10ML IV SOLN
INTRAVENOUS | Status: DC | PRN
  Administered 2013-06-29: 45 mg via INTRAVENOUS

## 2013-06-29 MED ORDER — 0.9 % SODIUM CHLORIDE (POUR BTL) OPTIME
TOPICAL | Status: DC | PRN
  Administered 2013-06-29: 1000 mL

## 2013-06-29 MED ORDER — CEFAZOLIN SODIUM 1-5 GM-% IV SOLN
1.0000 g | Freq: Three times a day (TID) | INTRAVENOUS | Status: AC
  Administered 2013-06-29 – 2013-06-30 (×3): 1 g via INTRAVENOUS
  Filled 2013-06-29 (×3): qty 50

## 2013-06-29 MED ORDER — ONDANSETRON HCL 4 MG/2ML IJ SOLN
INTRAMUSCULAR | Status: DC | PRN
  Administered 2013-06-29: 4 mg via INTRAMUSCULAR

## 2013-06-29 SURGICAL SUPPLY — 48 items
APL SKNCLS STERI-STRIP NONHPOA (GAUZE/BANDAGES/DRESSINGS) ×1
BENZOIN TINCTURE PRP APPL 2/3 (GAUZE/BANDAGES/DRESSINGS) ×2 IMPLANT
CANISTER SUCTION 2500CC (MISCELLANEOUS) ×2 IMPLANT
CANNULA VESSEL 3MM 2 BLNT TIP (CANNULA) ×2 IMPLANT
CLIP LIGATING EXTRA MED SLVR (CLIP) ×4 IMPLANT
CLIP LIGATING EXTRA SM BLUE (MISCELLANEOUS) ×4 IMPLANT
CLSR STERI-STRIP ANTIMIC 1/2X4 (GAUZE/BANDAGES/DRESSINGS) ×1 IMPLANT
COVER SURGICAL LIGHT HANDLE (MISCELLANEOUS) ×2 IMPLANT
DRAIN SNY 10X20 3/4 PERF (WOUND CARE) IMPLANT
DRAPE INCISE IOBAN 66X45 STRL (DRAPES) ×2 IMPLANT
DRAPE WARM FLUID 44X44 (DRAPE) ×2 IMPLANT
DRAPE X-RAY CASS 24X20 (DRAPES) IMPLANT
DRSG COVADERM 4X10 (GAUZE/BANDAGES/DRESSINGS) IMPLANT
DRSG COVADERM 4X8 (GAUZE/BANDAGES/DRESSINGS) IMPLANT
ELECT REM PT RETURN 9FT ADLT (ELECTROSURGICAL) ×2
ELECTRODE REM PT RTRN 9FT ADLT (ELECTROSURGICAL) ×1 IMPLANT
EVACUATOR SILICONE 100CC (DRAIN) IMPLANT
GLOVE SS BIOGEL STRL SZ 7.5 (GLOVE) ×1 IMPLANT
GLOVE SUPERSENSE BIOGEL SZ 7.5 (GLOVE) ×1
GOWN STRL NON-REIN LRG LVL3 (GOWN DISPOSABLE) ×6 IMPLANT
GRAFT CV 60X8STRG TUBE KNTD (Vascular Products) IMPLANT
GRAFT HEMASHIELD 8MM (Vascular Products) ×2 IMPLANT
INSERT FOGARTY SM (MISCELLANEOUS) IMPLANT
KIT BASIN OR (CUSTOM PROCEDURE TRAY) ×2 IMPLANT
KIT ROOM TURNOVER OR (KITS) ×2 IMPLANT
NS IRRIG 1000ML POUR BTL (IV SOLUTION) ×4 IMPLANT
PACK PERIPHERAL VASCULAR (CUSTOM PROCEDURE TRAY) ×2 IMPLANT
PAD ARMBOARD 7.5X6 YLW CONV (MISCELLANEOUS) ×4 IMPLANT
SET COLLECT BLD 21X3/4 12 (NEEDLE) IMPLANT
SPONGE GAUZE 4X4 12PLY (GAUZE/BANDAGES/DRESSINGS) ×3 IMPLANT
STAPLER VISISTAT 35W (STAPLE) IMPLANT
STOPCOCK 4 WAY LG BORE MALE ST (IV SETS) IMPLANT
STRIP CLOSURE SKIN 1/2X4 (GAUZE/BANDAGES/DRESSINGS) ×2 IMPLANT
SUT PROLENE 5 0 CC 1 (SUTURE) ×3 IMPLANT
SUT PROLENE 6 0 CC (SUTURE) ×2 IMPLANT
SUT SILK 2 0 FS (SUTURE) IMPLANT
SUT SILK 2 0 SH (SUTURE) IMPLANT
SUT VIC AB 2-0 CTX 36 (SUTURE) ×4 IMPLANT
SUT VIC AB 3-0 SH 27 (SUTURE) ×4
SUT VIC AB 3-0 SH 27X BRD (SUTURE) ×2 IMPLANT
SUT VIC AB 4-0 PS2 27 (SUTURE) ×1 IMPLANT
TAPE CLOTH SURG 4X10 WHT LF (GAUZE/BANDAGES/DRESSINGS) ×2 IMPLANT
TOWEL OR 17X24 6PK STRL BLUE (TOWEL DISPOSABLE) ×4 IMPLANT
TOWEL OR 17X26 10 PK STRL BLUE (TOWEL DISPOSABLE) ×4 IMPLANT
TRAY FOLEY CATH 16FRSI W/METER (SET/KITS/TRAYS/PACK) ×2 IMPLANT
TUBE CONNECTING 12X1/4 (SUCTIONS) ×1 IMPLANT
TUBING EXTENTION W/L.L. (IV SETS) IMPLANT
WATER STERILE IRR 1000ML POUR (IV SOLUTION) ×2 IMPLANT

## 2013-06-29 NOTE — H&P (View-Only) (Signed)
[facility] [place]  Subjective  - No new complaints other than left arm bruising from IV infiltrate.   Objective 147/77 69 97.5 F (36.4 C) (Oral) 18 99%  Intake/Output Summary (Last 24 hours) at 06/28/13 0816 Last data filed at 06/27/13 2300  Gross per 24 hour  Intake 755.19 ml  Output      0 ml  Net 755.19 ml   Left groin soft no active bleeding dressing clean and dry. No edema or hematoma.  Feet warm to touch Left antecubital area with edema and ecchymosis secondary to IV infiltrate.  N/V/M intact.   Assessment/Planning: Procedure(s): ABDOMINAL AORTAGRAM ANGIOGRAM EXTREMITY BILATERAL BILATERAL UPPER EXTREMITY ANGIOGRAM  2 Days Post-OpSurgeon(s): Charles E Fields, MD Plan Right axillary femoral bypass is planned for October 13. NPO      [name], Anne MAUREEN 06/28/2013 8:16 AM --  Laboratory Lab Results:  Recent Labs  06/27/13 0055 06/28/13 0425  WBC 10.2 13.0*  HGB 11.5* 11.4*  HCT 35.3* 34.2*  PLT 157 151   BMET  Recent Labs  06/26/13 0921 06/26/13 0942  NA 137 137  K 4.6 4.6  CL  --  106  GLUCOSE 82 78  BUN  --  35*  CREATININE  --  1.50*    COAG Lab Results  Component Value Date   INR 1.0 ratio 06/01/2009   INR 1.0 04/05/2009   INR 1.0 11/02/2008   No results found for this basename: PTT   I agree with the above.  The patient continues to complain of tingling and burning in both feet, right greater than left.  I discussed the details of the operation tomorrow which will be a redo right femoral exposure and a right axillary femoral bypass.  All her questions were answered.  She'll be n.p.o. after midnight.  I will check blood work in the morning.  Wells Brabham     

## 2013-06-29 NOTE — Transfer of Care (Signed)
Immediate Anesthesia Transfer of Care Note  Patient: [name]  Procedure(s) Performed: Procedure(s): BYPASS GRAFT AXILLA-RIGHT FEMORAL (Right)  Patient Location: PACU  Anesthesia Type:General  Level of Consciousness: awake, alert , oriented and patient cooperative  Airway & Oxygen Therapy: Patient Spontanous Breathing and Patient connected to nasal cannula oxygen  Post-op Assessment: Report given to PACU RN, Post -op Vital signs reviewed and stable and Patient moving all extremities  Post vital signs: Reviewed and stable  Complications: No apparent anesthesia complications

## 2013-06-29 NOTE — Anesthesia Preprocedure Evaluation (Addendum)
Anesthesia Evaluation  Patient identified by MRN, date of birth, ID band Patient awake    Reviewed: Allergy & Precautions, H&P , NPO status , Patient's Chart, lab work & pertinent test results, reviewed documented beta blocker date and time   History of Anesthesia Complications Negative for: history of anesthetic complications  Airway Mallampati: II TM Distance: >3 FB Neck ROM: Full    Dental  (+) Edentulous Upper, Edentulous Lower and Dental Advisory Given   Pulmonary shortness of breath, Current Smoker,  1-1.5 ppd cigs breath sounds clear to auscultation  Pulmonary exam normal       Cardiovascular hypertension, Pt. on medications and Pt. on home beta blockers - angina+ CAD, + Past MI, + Cardiac Stents and + Peripheral Vascular Disease + Cardiac Defibrillator (for low CO, has never shocked her) Rhythm:Regular Rate:Normal  '13 ECHO: EF 25-30%   Neuro/Psych TIA   GI/Hepatic Neg liver ROS, GERD-  Medicated and Controlled,  Endo/Other  negative endocrine ROS  Renal/GU negative Renal ROS     Musculoskeletal   Abdominal   Peds  Hematology negative hematology ROS (+)   Anesthesia Other Findings Full dentures out to labelled cup to OR desk  Reproductive/Obstetrics                          Anesthesia Physical Anesthesia Plan  ASA: III  Anesthesia Plan: General   Post-op Pain Management:    Induction: Intravenous  Airway Management Planned: Oral ETT  Additional Equipment: Arterial line, CVP and Ultrasound Guidance Line Placement  Intra-op Plan:   Post-operative Plan: Extubation in OR  Informed Consent: I have reviewed the patients History and Physical, chart, labs and discussed the procedure including the risks, benefits and alternatives for the proposed anesthesia with the patient or authorized representative who has indicated his/her understanding and acceptance.     Plan Discussed  with: CRNA and Surgeon  Anesthesia Plan Comments: (Plan routine monitors, A line, CVP, GETA)        Anesthesia Quick Evaluation

## 2013-06-29 NOTE — Op Note (Signed)
OPERATIVE REPORT  DATE OF SURGERY: 06/29/2013  PATIENT: [name], 67 y.o. female MRN: [id number]  DOB: 03-06-1946  PRE-OPERATIVE DIAGNOSIS: Lower extremity arterial insufficiency  POST-OPERATIVE DIAGNOSIS:  Same  PROCEDURE: Right axillary to femoral bypass with 8 mm Hemashield graft  SURGEON:  Gretta Began, M.D.  PHYSICIAN ASSISTANT: Roczniak  ANESTHESIA:  Gen.  EBL: 50 ml  Total I/O In: 1150 [I.V.:1150] Out: 400 [Urine:350; Blood:50]  BLOOD ADMINISTERED: None  DRAINS: None  SPECIMEN: None  COUNTS CORRECT:  YES  PLAN OF CARE: PACU   PATIENT DISPOSITION:  PACU - hemodynamically stable  PROCEDURE DETAILS: Patient's had prior extensive of bilateral extremity revascularization in the past. She had a left to right fem-fem bypass and has bilateral infrainguinal bypasses. She presented with worsening ischemia in her left foot. Arteriogram revealed occlusion of her inflow to the left to right fem-fem bypass with complete occlusion of her left external iliac arteries. Arteriogram revealed patency of her fem-fem graft via collaterals and also patency of bilateral femoropopliteal bypasses. It is recommended that she undergo a right axillary to femoral bypass to improve inflow into her grafts.  The patient was taken to the operating placed supine position where the area of the right chest both groins and both legs were prepped and draped in usual sterile fashion. An incision was made through the prior scar in the right groin. The fem-fem bypass was isolated before the anastomosis into the common femoral artery on the right. A separate incision was made in the right in the infraclavicular region. The muscle bodies of the pectoralis major were divided and the extra is minor muscle was mobilized. The subclavian artery was isolated and was of minimal atherosclerotic change. A tunnel was created from the right groin to the right axillary artery. An 8 mm Hemashield graft. The patient was  given 6000 units intravenous heparin after adequate circulation time stating artery was occluded proximal and distally and was opened with an 11 blade symptoms A+. The graft was sewn end-to-side to the artery with a running 5-0 Prolene suture. The anastomosis was tested and found to be adequate. Graft itself was flushed with heparinized and reoccluded and the clamps removed from the subclavian artery. Next the fem-fem bypass was occluded proximally and distally near the right femoral anastomosis. The graft was opened and there was some chronic mural thrombus at this level which was removed. There was good backbleeding from both the right and left side. The graft was cut to appropriate length and was sewn end-to-side to the old fem-fem bypass with a running 5-0 Prolene suture. Clamps removed and a Doppler dependent flow was noted popliteal space on the right and left groin. The patient was given 50 mg of protamine to reverse heparin. Wound irrigated with saline. A CT cautery. Wounds were closed with 3-0 Vicryl in the subcutaneous tissue and the skin was closed with [place] subcuticular Vicryl stitch. This was taken to the recovery in stable condition   Gretta Began, M.D. 06/29/2013 10:40 AM

## 2013-06-29 NOTE — OR Nursing (Signed)
icd on by [facility] rep/sandy

## 2013-06-29 NOTE — OR Nursing (Signed)
persistant dampened and positional waveform for aline/ when in correct position pressures are 130's systlic with mean in 70's

## 2013-06-29 NOTE — Anesthesia Postprocedure Evaluation (Signed)
  Anesthesia Post-op Note  Patient: [name]  Procedure(s) Performed: Procedure(s): BYPASS GRAFT AXILLA-RIGHT FEMORAL (Right)  Patient Location: PACU  Anesthesia Type:General  Level of Consciousness: awake, alert , oriented and patient cooperative  Airway and Oxygen Therapy: Patient Spontanous Breathing and Patient connected to nasal cannula oxygen  Post-op Pain: none  Post-op Assessment: Post-op Vital signs reviewed, Patient's Cardiovascular Status Stable, Respiratory Function Stable, Patent Airway, No signs of Nausea or vomiting and Pain level controlled  Post-op Vital Signs: Reviewed and stable  Complications: No apparent anesthesia complications

## 2013-06-29 NOTE — OR Nursing (Signed)
AICD present on left chest at pre-op assessment. [employer] turned off defibrillator function, still pacing, before anesthesia induction. Defibrillator pads placed externally in case of emergency.

## 2013-06-29 NOTE — Progress Notes (Signed)
Pt arrived from PACU, VSS, dopp pulses, will continue to monitor.

## 2013-06-29 NOTE — OR Nursing (Signed)
AICD confirmed with Cath Lab to be [facility]

## 2013-06-29 NOTE — Interval H&P Note (Signed)
History and Physical Interval Note:  06/29/2013 7:07 AM  [name]  has presented today for surgery, with the diagnosis of AIOD  The various methods of treatment have been discussed with the patient and family. After consideration of risks, benefits and other options for treatment, the patient has consented to  Procedure(s): BYPASS GRAFT AXILLA-RIGHT FEMORAL (Right) as a surgical intervention .  The patient's history has been reviewed, patient examined, no change in status, stable for surgery.  I have reviewed the patient's chart and labs.  Questions were answered to the patient's satisfaction.     EARLY, [name]

## 2013-06-29 NOTE — Telephone Encounter (Addendum)
Message copied by Fredrich Birks on Mon Jun 29, 2013  1:59 PM ------      Message from: Phillips Odor      Created: Mon Jun 29, 2013 11:12 AM                   ----- Message -----         From: Marlowe Shores, PA-C         Sent: 06/29/2013  10:23 AM           To: Melene Plan, RN, [facility]            3-4 weeks s/p right AX - Fem  - Early ------  06/29/13: left message for patient regarding appt on 07/21/13 at 8:45am, dpm

## 2013-06-30 DIAGNOSIS — Z48812 Encounter for surgical aftercare following surgery on the circulatory system: Secondary | ICD-10-CM

## 2013-06-30 LAB — BASIC METABOLIC PANEL WITH GFR
BUN: 12 mg/dL (ref 6–23)
CO2: 25 meq/L (ref 19–32)
Calcium: 8.8 mg/dL (ref 8.4–10.5)
Chloride: 102 meq/L (ref 96–112)
Creatinine, Ser: 1.11 mg/dL — ABNORMAL HIGH (ref 0.50–1.10)
GFR calc Af Amer: 59 mL/min — ABNORMAL LOW
GFR calc non Af Amer: 51 mL/min — ABNORMAL LOW
Glucose, Bld: 103 mg/dL — ABNORMAL HIGH (ref 70–99)
Potassium: 3.5 meq/L (ref 3.5–5.1)
Sodium: 138 meq/L (ref 135–145)

## 2013-06-30 LAB — CBC
Hemoglobin: 10.7 g/dL — ABNORMAL LOW (ref 12.0–15.0)
MCH: 31.6 pg (ref 26.0–34.0)
MCHC: 32.7 g/dL (ref 30.0–36.0)
RBC: 3.39 MIL/uL — ABNORMAL LOW (ref 3.87–5.11)
RDW: 15.4 % (ref 11.5–15.5)

## 2013-06-30 NOTE — Evaluation (Signed)
Physical Therapy Evaluation Patient Details Name: [name] MRN: [id number] DOB: Oct 15, 1945 Today's Date: 06/30/2013 Time: 4098-1191 PT Time Calculation (min): 31 min  PT Assessment / Plan / Recommendation History of Present Illness  RIGHT  AXILLO-FEMORAL BYPASS GRAFT  Clinical Impression  Pt tolerated ambulation well this date. Recommend ST-SNF for due to patient report of falling once a week and pt's spouse with limited ability to assist pt. However pt very resistive and strongly desires to return home. If patient returns home pt to require use of RW for safe amb and HHPT to progress mobility to safe mod I. Acute PT to follow to progress mobility as able.    PT Assessment  Patient needs continued PT services    Follow Up Recommendations  SNF;Supervision/Assistance - 24 hour;( Home health PT- if pt refuses SNF)    Does the patient have the potential to tolerate intense rehabilitation      Barriers to Discharge   pt's spouse with limited ability to assist pt    Equipment Recommendations  Rolling walker with 5" wheels    Recommendations for Other Services     Frequency Min 3X/week    Precautions / Restrictions Precautions Precautions: Fall Precaution Comments: spouse reports she falls 1x/week Restrictions Weight Bearing Restrictions: No   Pertinent Vitals/Pain 8/10 bilat feet pain      Mobility  Bed Mobility Bed Mobility: Supine to Sit;Sitting - Scoot to Edge of Bed Supine to Sit: 3: Mod assist;With rails;HOB elevated Sitting - Scoot to Edge of Bed: 4: Min assist Details for Bed Mobility Assistance: assist for LE management and trunk elevation due to pain Transfers Transfers: Sit to Stand;Stand to Sit Sit to Stand: 4: Min assist;With upper extremity assist;From bed Stand to Sit: 4: Min assist;With upper extremity assist;To chair/3-in-1 Details for Transfer Assistance: v/c's for safe hand placement Ambulation/Gait Ambulation/Gait Assistance: 4: Min  assist Ambulation Distance (Feet): 120 Feet Assistive device: Rolling walker Ambulation/Gait Assistance Details: v/c's/minA for walker management, pt unsteady but no obvious LOB Gait Pattern: Step-through pattern;Decreased stride length;Shuffle Gait velocity: slow Stairs: No    Exercises General Exercises - Lower Extremity Ankle Circles/Pumps: AROM;10 reps;Both Long Arc Quad: AROM;10 reps;Both;Seated Hip Flexion/Marching: PROM;Both;10 reps;Supine   PT Diagnosis: Difficulty walking;Generalized weakness  PT Problem List: Decreased strength;Decreased activity tolerance;Decreased mobility;Decreased safety awareness PT Treatment Interventions: DME instruction;Gait training;Stair training;Therapeutic activities;Functional mobility training;Therapeutic exercise     PT Goals(Current goals can be found in the care plan section) Acute Rehab PT Goals Patient Stated Goal: home PT Goal Formulation: With patient/family Time For Goal Achievement: 07/07/13 Potential to Achieve Goals: Good  Visit Information  Last PT Received On: 06/30/13 Assistance Needed: +1 History of Present Illness: RIGHT  AXILLO-FEMORAL BYPASS GRAFT       Prior Functioning  Home Living Family/patient expects to be discharged to:: Private residence Living Arrangements: Spouse/significant other Available Help at Discharge: Family;Available 24 hours/day Type of Home: House Home Access: Stairs to enter [employer] of Steps: 4 Entrance Stairs-Rails: Can reach both Home Layout: One level Home Equipment: Cane - single point Prior Function Level of Independence: Needs assistance Gait / Transfers Assistance Needed: pt uses cane, but reports she falls 1x/week (spouse validated) ADL's / Homemaking Assistance Needed: pt reports dtr to assist her with dressing/bathing, spouse reports she does it herself Communication Communication: No difficulties Dominant Hand: Right    Cognition  Cognition Arousal/Alertness:  Awake/alert Behavior During Therapy: WFL for tasks assessed/performed Overall Cognitive Status: Impaired/Different from baseline Area of Impairment: Problem solving;Memory  Memory: Decreased short-term memory Problem Solving: Slow processing    Extremity/Trunk Assessment Upper Extremity Assessment Upper Extremity Assessment: Generalized weakness Lower Extremity Assessment Lower Extremity Assessment: Generalized weakness Cervical / Trunk Assessment Cervical / Trunk Assessment: Normal   Balance Balance Balance Assessed: Yes Static Sitting Balance Static Sitting - Balance Support: Feet supported;Right upper extremity supported Static Sitting - Level of Assistance: 5: Stand by assistance Static Sitting - Comment/# of Minutes: 5 min  End of Session PT - End of Session Equipment Utilized During Treatment: Gait belt Activity Tolerance: Patient tolerated treatment well Patient left: in chair;with call bell/phone within reach;with nursing/sitter in room Nurse Communication: Mobility status (pt bleeding from A-line, RN present to address)  GP     Marcene Brawn 06/30/2013, 9:38 AM  Lewis Shock, PT, DPT Pager #: [phone] Office #: [phone]

## 2013-06-30 NOTE — Progress Notes (Signed)
Pt tX to 2W-33, VSS, d/c A line, foley.

## 2013-06-30 NOTE — Progress Notes (Signed)
Subjective: Interval History: none.. comfortable with mild soreness  Objective: Vital signs in last 24 hours: Temp:  [97.6 F (36.4 C)-98.9 F (37.2 C)] 98.1 F (36.7 C) (10/14 0400) Pulse Rate:  [81-114] 94 (10/14 0400) Resp:  [12-22] 13 (10/14 0400) BP: (104-164)/(66-82) 118/82 mmHg (10/14 0400) SpO2:  [92 %-98 %] 92 % (10/14 0400) Arterial Line BP: (126-158)/(55-74) 126/55 mmHg (10/13 1810)  Intake/Output from previous day: 10/13 0701 - 10/14 0700 In: 1150 [I.V.:1150] Out: 550 [Urine:500; Blood:50] Intake/Output this shift: Total I/O In: -  Out: 150 [Urine:150]  Dressings dry and intact in the right infraclavicular and groin. 2+ graft pulse. The patient is a 2+ dorsalis pedis pulse on the right and faintly palpable posterior tibial pulse on the left with excellent Doppler flow  Lab Results:  Recent Labs  06/29/13 0405 06/30/13 0545  WBC 15.7* 18.2*  HGB 11.3* 10.7*  HCT 34.4* 32.7*  PLT 162 160   BMET  Recent Labs  06/29/13 0405 06/30/13 0545  NA 135 138  K 3.4* 3.5  CL 99 102  CO2 26 25  GLUCOSE 100* 103*  BUN 17 12  CREATININE 1.09 1.11*  CALCIUM 9.1 8.8    Studies/Results: Dg Chest Port 1 View  06/29/2013   CLINICAL DATA:  Left IJ catheter.  EXAM: PORTABLE CHEST - 1 VIEW  COMPARISON:  To 11/2012  FINDINGS: Left internal jugular central line is in place with the tip near the confluence of the innominate veins. No pneumothorax. Left AICD is unchanged. Heart is normal size. No confluent airspace opacities or effusions.  IMPRESSION: Left central line tip at the confluence of the innominate veins. No pneumothorax.   Electronically Signed   By: Charlett Nose M.D.   On: 06/29/2013 11:40   Anti-infectives: Anti-infectives   Start     Dose/Rate Route Frequency Ordered Stop   06/29/13 2000  ceFAZolin (ANCEF) IVPB 1 g/50 mL premix     1 g 100 mL/hr over 30 Minutes Intravenous Every 8 hours 06/29/13 1914 06/30/13 1959   06/29/13 0500  [MAR Hold]  ceFAZolin  (ANCEF) IVPB 1 g/50 mL premix     (On MAR Hold since 06/29/13 0713)  Comments:  Send with pt to OR   1 g 100 mL/hr over 30 Minutes Intravenous On call 06/28/13 0837 06/29/13 0825      Assessment/Plan: s/p Procedure(s): BYPASS GRAFT AXILLA-RIGHT FEMORAL (Right) Continue foley due to DC Foley catheter today. Transfer to unit 2000. Mobilize.   LOS: 4 days   EARLY, [name] 06/30/2013, 6:30 AM

## 2013-06-30 NOTE — Evaluation (Signed)
Occupational Therapy Evaluation Patient Details Name: [name] MRN: [id number] DOB: 1946-01-02 Today's Date: 06/30/2013 Time: 4098-1191 OT Time Calculation (min): 28 min  OT Assessment / Plan / Recommendation History of present illness RIGHT  AXILLO-FEMORAL BYPASS GRAFT   Clinical Impression   Pt seen for above diagnosis and has the deficits listed below.  Pt would benefit from cont OT to increase I with basic adls so she can have a safe and fall free d/c home with her husband.    OT Assessment  Patient needs continued OT Services    Follow Up Recommendations  SNF;Supervision/Assistance - 24 hour    Barriers to Discharge Decreased caregiver support unsure just how much the husband is actulally there.  Equipment Recommendations  None recommended by OT    Recommendations for Other Services    Frequency  Min 2X/week    Precautions / Restrictions Precautions Precautions: Fall Precaution Comments: spouse reports she falls 1x/week Restrictions Weight Bearing Restrictions: No   Pertinent Vitals/Pain Vitals stable.  Pt c/o 4/10 pain in groin on R.    ADL  Eating/Feeding: Performed;Set up Where Assessed - Eating/Feeding: Chair Grooming: Performed;Set up Where Assessed - Grooming: Unsupported sitting Upper Body Bathing: Simulated;Minimal assistance Where Assessed - Upper Body Bathing: Unsupported sitting Lower Body Bathing: Simulated;Moderate assistance Where Assessed - Lower Body Bathing: Supported sit to stand Upper Body Dressing: Simulated;Minimal assistance Where Assessed - Upper Body Dressing: Unsupported sitting Lower Body Dressing: Performed;Moderate assistance Where Assessed - Lower Body Dressing: Supported sit to stand Toilet Transfer: Performed;Minimal Dentist Method: Surveyor, minerals: Materials engineer and Hygiene: Simulated;Minimal assistance Where Assessed - Medical sales representative and Hygiene: Standing Transfers/Ambulation Related to ADLs: Pt requires min guard to min assist for most transfers.  Pt with decreased endurance during all activities. ADL Comments: Pt able to cross legs over knees to dress LE with some discomfort.  Will continue to monitor to see if pt needs AE but feel she will not.    OT Diagnosis: Generalized weakness;Cognitive deficits;Acute pain  OT Problem List: Decreased strength;Decreased activity tolerance;Impaired balance (sitting and/or standing);Decreased cognition;Decreased knowledge of use of DME or AE;Pain OT Treatment Interventions: Self-care/ADL training;Therapeutic activities;Therapeutic exercise;DME and/or AE instruction   OT Goals(Current goals can be found in the care plan section) Acute Rehab OT Goals Patient Stated Goal: home OT Goal Formulation: With patient Time For Goal Achievement: 07/14/13 Potential to Achieve Goals: Good ADL Goals Pt Will Perform Grooming: with supervision;standing Pt Will Perform Lower Body Bathing: sit to/from stand;with supervision Pt Will Perform Upper Body Dressing: with supervision;sitting Pt Will Perform Lower Body Dressing: with supervision;sit to/from stand Pt Will Perform Tub/Shower Transfer: Tub transfer;with supervision;tub bench Additional ADL Goal #1: Pt will complete all toileting with S on raised commode.  Visit Information  Last OT Received On: 06/30/13 Assistance Needed: +1 History of Present Illness: RIGHT  AXILLO-FEMORAL BYPASS GRAFT       Prior Functioning     Home Living Family/patient expects to be discharged to:: Private residence Living Arrangements: Spouse/significant other Available Help at Discharge: Family;Available 24 hours/day Type of Home: House Home Access: Stairs to enter [employer] of Steps: 4 Entrance Stairs-Rails: Can reach both Home Layout: One level Home Equipment: Walker - standard;Cane - single point;Tub bench Prior  Function Level of Independence: Needs assistance Gait / Transfers Assistance Needed: pt uses cane, but reports she falls 1x/week (spouse validated) ADL's / Homemaking Assistance Needed: pt reports dtr to assist her with  dressing/bathing, spouse reports she does it herself Communication Communication: No difficulties Dominant Hand: Right         Vision/Perception Vision - History Baseline Vision: No visual deficits Patient Visual Report: No change from baseline Vision - Assessment Vision Assessment: Vision not tested   Cognition  Cognition Arousal/Alertness: Awake/alert Behavior During Therapy: WFL for tasks assessed/performed Overall Cognitive Status: Impaired/Different from baseline Area of Impairment: Problem solving;Memory Memory: Decreased short-term memory Problem Solving: Slow processing General Comments: Pt slow to answer but accurate.  Pt argues with husband regularly while in room about her status and how much he is gone at home.  He says he leaves her alone to run errands and all day on Saturdays while he fishes.  Did rec that someone stay with her 24/7 for awhile after pt returns home.    Extremity/Trunk Assessment Upper Extremity Assessment Upper Extremity Assessment: Generalized weakness Lower Extremity Assessment Lower Extremity Assessment: Defer to PT evaluation Cervical / Trunk Assessment Cervical / Trunk Assessment: Normal     Mobility Bed Mobility Bed Mobility: Supine to Sit;Sitting - Scoot to Edge of Bed Supine to Sit: 3: Mod assist;With rails;HOB elevated Sitting - Scoot to Edge of Bed: 4: Min assist Details for Bed Mobility Assistance: assist for LE management and trunk elevation due to pain Transfers Transfers: Sit to Stand;Stand to Sit Sit to Stand: 4: Min assist;With upper extremity assist;From bed Stand to Sit: 4: Min assist;With upper extremity assist;To chair/3-in-1 Details for Transfer Assistance: v/c's for safe hand placement     Exercise  General Exercises - Lower Extremity Ankle Circles/Pumps: AROM;10 reps;Both Long Arc Quad: AROM;10 reps;Both;Seated Hip Flexion/Marching: PROM;Both;10 reps;Supine   Balance Balance Balance Assessed: Yes Static Sitting Balance Static Sitting - Balance Support: Feet supported;Right upper extremity supported Static Sitting - Level of Assistance: 5: Stand by assistance Static Sitting - Comment/# of Minutes: 5   End of Session OT - End of Session Activity Tolerance: Patient limited by fatigue Patient left: in chair;with call bell/phone within reach;with family/visitor present Nurse Communication: Mobility status  GO     Hope Budds 06/30/2013, 10:10 AM [phone]

## 2013-06-30 NOTE — Progress Notes (Signed)
[facility] [place]  Progress Note Bypass Surgery  Date of Surgery: 06/26/2013 - 06/29/2013  Procedure(s):  RIGHT  AXILLO-FEMORAL BYPASS GRAFT Surgeon: Surgeon(s): Larina Earthly, MD  1 Day Post-Op  History of Present Illness  [name] is a 67 y.o. female who is  1 Day Post-Op. The patient is doing well. C/O soreness in shoulders and groin  . Patients pain is well controlled.    VASC. LAB Studies:        ABI: Pending  Significant Diagnostic Studies: CBC Lab Results  Component Value Date   WBC 18.2* 06/30/2013   HGB 10.7* 06/30/2013   HCT 32.7* 06/30/2013   MCV 96.5 06/30/2013   PLT 160 06/30/2013    BMET    Component Value Date/Time   NA 138 06/30/2013 0545   K 3.5 06/30/2013 0545   CL 102 06/30/2013 0545   CO2 25 06/30/2013 0545   GLUCOSE 103* 06/30/2013 0545   BUN 12 06/30/2013 0545   CREATININE 1.11* 06/30/2013 0545   CALCIUM 8.8 06/30/2013 0545   GFRNONAA 51* 06/30/2013 0545   GFRAA 59* 06/30/2013 0545    COAG Lab Results  Component Value Date   INR 0.94 06/29/2013   INR 1.0 ratio 06/01/2009   INR 1.0 04/05/2009   No results found for this basename: PTT    Physical Examination  BP Readings from Last 3 Encounters:  06/30/13 140/77  06/30/13 140/77  06/30/13 140/77   Temp Readings from Last 3 Encounters:  06/30/13 97.8 F (36.6 C) Oral  06/30/13 97.8 F (36.6 C) Oral  06/30/13 97.8 F (36.6 C) Oral   SpO2 Readings from Last 3 Encounters:  06/30/13 92%  06/30/13 92%  06/30/13 92%   Pulse Readings from Last 3 Encounters:  06/30/13 94  06/30/13 94  06/30/13 94    Pt is A&O x 3 right upper extremity: Incision/s is/are ecchymotic and clean,dry.intact, Right groin wound healing with minimal drainage Limb is warm; with good color  Right Dorsalis Pedis pulse is faintly monophasic by Doppler Right Posterior tibial pulse is monophasic by Doppler  Left Dorsalis Pedis pulse is faintly monophasic by Doppler Left  Posterior tibial pulse is monophasic by Doppler   Assessment/Plan: Pt. Doing well Post-op pain is controlled Wounds are healing well PT/OT for ambulation Continue wound care as ordered DC Foley Transfer to 2000  [name]  06/30/2013 8:20 AM

## 2013-06-30 NOTE — Progress Notes (Signed)
VASCULAR LAB PRELIMINARY  ARTERIAL  ABI completed:    RIGHT    LEFT    PRESSURE WAVEFORM  PRESSURE WAVEFORM  BRACHIAL 123 Triphasic BRACHIAL 111 Triphasic  DP 110 Monophasic DP 75 Monophasic  AT   AT    PT 112 Monophasic PT 96 Monophasic  PER   PER    GREAT TOE  NA GREAT TOE  NA    RIGHT LEFT  ABI 0.91 0.78   The right ABI is suggestive of mild, borderline normal, arterial insufficiency. The left ABI is suggestive of mild, borderline moderate, arterial insufficiency.  06/30/2013 5:52 PM Gertie Fey, RVT, RDCS, RDMS

## 2013-07-01 ENCOUNTER — Encounter (HOSPITAL_COMMUNITY): Payer: Self-pay | Admitting: Vascular Surgery

## 2013-07-01 MED ORDER — BISACODYL 10 MG RE SUPP
10.0000 mg | Freq: Every day | RECTAL | Status: DC | PRN
  Administered 2013-07-01: 10 mg via RECTAL
  Filled 2013-07-01: qty 1

## 2013-07-01 MED ORDER — OXYCODONE HCL 5 MG PO TABS
5.0000 mg | ORAL_TABLET | ORAL | Status: DC | PRN
  Administered 2013-07-01 – 2013-07-02 (×3): 10 mg via ORAL
  Filled 2013-07-01 (×3): qty 2

## 2013-07-01 MED ORDER — OXYCODONE HCL 5 MG PO TABS: 5.0000 mg | ORAL_TABLET | ORAL | Status: DC | PRN

## 2013-07-01 MED ORDER — MAGNESIUM HYDROXIDE 400 MG/5ML PO SUSP
30.0000 mL | Freq: Every day | ORAL | Status: DC | PRN
  Filled 2013-07-01: qty 30

## 2013-07-01 NOTE — Progress Notes (Signed)
[facility] [place]  Progress Note Bypass Surgery  Date of Surgery: 06/29/2013  Right axillary to femoral bypass with 8 mm Hemashield graft  Surgeon: Surgeon(s): Larina Earthly, MD  2 Days Post-Op  History of Present Illness  [name] is a 67 y.o. female who is  2 Days Post-Op. The patient's pre-op symptoms of pain in left toes are Unchanged . Patients pain is well controlled.    VASC. LAB Studies:        ABI: Right 0.91;  Left 0.78;   Physical Examination  BP Readings from Last 3 Encounters:  07/01/13 129/77  07/01/13 129/77  07/01/13 129/77   Temp Readings from Last 3 Encounters:  07/01/13 97.9 F (36.6 C) Oral  07/01/13 97.9 F (36.6 C) Oral  07/01/13 97.9 F (36.6 C) Oral   SpO2 Readings from Last 3 Encounters:  07/01/13 95%  07/01/13 95%  07/01/13 95%   Pulse Readings from Last 3 Encounters:  07/01/13 81  07/01/13 81  07/01/13 81    Pt is A&O x 3 right upper extremity: Incision/s is/are clean,dry.intact, and  healing without hematoma, erythema or drainage Right groin wound with min serosanguinous drainage  Limb is warm; with good color  Right Dorsalis Pedis pulse is palpable  Left Posterior tibial pulse is  palpable   Assessment/Plan: Pt. Doing well Post-op pain is controlled Wounds are healing well PT/OT for ambulation Continue wound care as ordered Plan DC tomorrow  Alex Mcmanigal J  07/01/2013 1:03 PM

## 2013-07-01 NOTE — Progress Notes (Signed)
Pt has not had BM since 10/10; PA made aware; new orders for PRN meds; will administer when available from pharmacy.

## 2013-07-01 NOTE — Progress Notes (Signed)
Clinical Social Work Department CLINICAL SOCIAL WORK PLACEMENT NOTE 07/01/2013  Patient:  VERONIQUE, WARGA  Account Number:  [id number] Admit date:  06/26/2013  Clinical Social Worker:  Dorathy Daft Jaymien Landin, CLINICAL SOCIAL WORKER  Date/time:  07/01/2013 02:54 PM  Clinical Social Work is seeking post-discharge placement for this patient at the following level of care:   SKILLED NURSING   (*CSW will update this form in Epic as items are completed)   07/01/2013  Patient/family provided with [facility] Department of Clinical Social Work's list of facilities offering this level of care within the geographic area requested by the patient (or if unable, by the patient's family).  07/01/2013  Patient/family informed of their freedom to choose among providers that offer the needed level of care, that participate in Medicare, Medicaid or managed care program needed by the patient, have an available bed and are willing to accept the patient.  07/01/2013  Patient/family informed of [facility]' ownership interest in [facility], as well as of the fact that they are under no obligation to receive care at this facility.  PASARR submitted to EDS on  PASARR number received from EDS on   FL2 transmitted to all facilities in geographic area requested by pt/family on  07/01/2013 FL2 transmitted to all facilities within larger geographic area on   Patient informed that his/her managed care company has contracts with or will negotiate with  certain facilities, including the following:     Patient/family informed of bed offers received:   Patient chooses bed at  Physician recommends and patient chooses bed at    Patient to be transferred to  on   Patient to be transferred to facility by   The following physician request were entered in Epic:   Additional Comments:

## 2013-07-01 NOTE — Progress Notes (Signed)
CSW spoke to daughter over the phone about dc plans. Daughter stated that she was thankful for the call and was concerned about her mom's plan post-hospitalization. Daughter stated she wants patient to go to SNF, but is going to talk with patient tonight to make sure she understands her options. Daughter states she will call CSW later to let her know the plan. CSW will update tomorrow morning.  Maree Krabbe, MSW, Theresia Majors [phone]

## 2013-07-01 NOTE — Clinical Social Work Note (Signed)
Clinical Social Work Department BRIEF PSYCHOSOCIAL ASSESSMENT 07/01/2013  Patient:  Anne, [name]     Account Number:  [id number]     Admit date:  06/26/2013  Clinical Social Worker:  Horald Pollen, CLINICAL SOCIAL WORKER  Date/Time:  07/01/2013 01:45 PM  Referred by:  Physician  Date Referred:  07/01/2013 Referred for  SNF Placement   Other Referral:   Interview type:  Patient Other interview type:    PSYCHOSOCIAL DATA Living Status:  ALONE Admitted from facility:   Level of care:   Primary support name:  Malyna Budney   [phone] Primary support relationship to patient:  CHILD, ADULT Degree of support available:   Adequate    CURRENT CONCERNS Current Concerns  Post-Acute Placement   Other Concerns:    SOCIAL WORK ASSESSMENT / PLAN CSW met with patient at bedside to offer support and discuss patient needs at discharge.  CSW explained the SNF process to patient and inquired about patient wishes post-hospitalization.  Patient reported that she is agreeable to SNF placement for rehabilitation.  Patient states that she lives alone, however, her daughter and grandkids live on the same road and are able to offer her adequate support.  CSW will complete FL2 for MD's signature and will update patient when bed offers are received.   Assessment/plan status:  Psychosocial Support/Ongoing Assessment of Needs Other assessment/ plan:   Information/referral to community resources:   SNF information    PATIENT'S/FAMILY'S RESPONSE TO PLAN OF CARE: Patient states she is agreeable to be faxed out to SNF placements in [facility].  Patient expressed her appreciation for CSW support.     Horald Pollen, MSW student

## 2013-07-01 NOTE — Progress Notes (Signed)
Pt sitting up in chair; pt requesting to get back to bed; pt encouraged to sit up in chair and eat lunch in chair; pt also encouraged to ambulate today; will cont. To monitor.

## 2013-07-01 NOTE — Progress Notes (Signed)
Physical Therapy Treatment Patient Details Name: [name] MRN: [id number] DOB: 12-Mar-1946 Today's Date: 07/01/2013 Time: 4098-1191 PT Time Calculation (min): 33 min  PT Assessment / Plan / Recommendation  History of Present Illness 67 y.o. female admitted to [facility] on 06/26/13 s/p right aillofemoral bypass graft.     PT Comments   Pt is progressing well with her mobility.  She is requiring less assistance, but continues to be at risk for falls if someone is not physically helping her at all times when she is up on her feet.  She needs cues for safe use of RW and needs assist to standing and walk.  SNF would be her safest d/c destination, but she states that her husband wants her to go home.  Daughter is still wanting SNF placement for rehab as well.    Follow Up Recommendations  SNF;Supervision/Assistance - 24 hour;Home health PT     Does the patient have the potential to tolerate intense rehabilitation    NA  Barriers to Discharge   None      Equipment Recommendations  Rolling walker with 5" wheels    Recommendations for Other Services   None  Frequency Min 3X/week   Progress towards PT Goals Progress towards PT goals: Progressing toward goals  Plan Current plan remains appropriate    Precautions / Restrictions Precautions Precautions: Fall Precaution Comments: spouse reports she falls 1x/week   Pertinent Vitals/Pain O2 sats and HR stable throughout tx.     Mobility  Bed Mobility Supine to Sit: 4: Min assist;With rails;HOB elevated Sitting - Scoot to Edge of Bed: 4: Min assist;With rail Details for Bed Mobility Assistance: min assist to support trunk during transitions, heavy reliance on upper extremities to get to EOB.  Transfers Transfers: Sit to Stand;Stand to Sit Sit to Stand: 4: Min assist;With upper extremity assist;With armrests;From bed;From chair/3-in-1;From toilet Stand to Sit: 4: Min assist;With upper extremity assist;With armrests;To bed;To chair/3-in-1;To  toilet Details for Transfer Assistance: min assist to supprot trunk for balance during tranisitions, verbal cues for safe hand placement. Ambulation/Gait Ambulation/Gait Assistance: 4: Min assist Ambulation Distance (Feet): 250 Feet Assistive device: Rolling walker Ambulation/Gait Assistance Details: verbal cues to stay inside of RW for gait.  Min assist at trunk for balance, especially while turning, navigating tight obstacles in room or while multitasking (walking and talking).   Gait Pattern: Step-through pattern;Decreased stride length;Shuffle Gait velocity: decreased    Exercises General Exercises - Upper Extremity Shoulder Flexion: AROM;Both;10 reps;Seated Elbow Flexion: AROM;Both;10 reps;Seated General Exercises - Lower Extremity Long Arc Quad: AROM;Both;10 reps;Seated Hip ABduction/ADduction: AROM;Both;10 reps;Seated Hip Flexion/Marching: AROM;Both;10 reps;Seated Toe Raises: AROM;Both;10 reps;Seated Heel Raises: AROM;Both;10 reps;Seated     PT Goals (current goals can now be found in the care plan section) Acute Rehab PT Goals Patient Stated Goal: home  Visit Information  Last PT Received On: 07/01/13 Assistance Needed: +1 History of Present Illness: 67 y.o. female admitted to [facility] on 06/26/13 s/p right aillofemoral bypass graft.      Subjective Data  Subjective: Pt reports her husband wants her to come home and not go to SNF for rehab.  Daughter is pushing for SNF for rehab.  Pt is siding with her husband.  Patient Stated Goal: home   Cognition  Cognition Arousal/Alertness: Awake/alert Behavior During Therapy: WFL for tasks assessed/performed Overall Cognitive Status: Impaired/Different from baseline Area of Impairment: Problem solving;Memory Memory: Decreased short-term memory Problem Solving: Slow processing General Comments: Pt self corrects, but needs extra time to do so.  Balance  Static Sitting Balance Static Sitting - Balance Support: Feet  supported Static Sitting - Level of Assistance: 5: Stand by assistance  End of Session PT - End of Session Activity Tolerance: Patient limited by fatigue;Patient limited by pain Patient left: in chair;with call bell/phone within reach    [facility] B. Chasey Dull, PT, DPT [phone]   07/01/2013, 4:34 PM

## 2013-07-01 NOTE — Progress Notes (Signed)
Pt given Dulcolax supp at this time; will cont. To monitor.

## 2013-07-01 NOTE — Progress Notes (Signed)
Maree Krabbe, MSW, Theresia Majors [phone]

## 2013-07-01 NOTE — Progress Notes (Addendum)
Attempted to give pt MOM; pt refused; will administer Dulcolax supp at this time; will cont. To monitor.

## 2013-07-01 NOTE — Clinical Social Work Note (Signed)
Azalyn Sliwa, MSW, LCSWA [phone]  

## 2013-07-02 NOTE — Progress Notes (Signed)
07/02/2013 1320 Central line d/c per orders and per protocol. Line intact, pt. Tolerated well. Pressure applied x 5 minutes per protocol and occlusive dressing applied. Pt. Advised of bedrest for 30 minutes post removal. Call bell within reach. Will continue to monitor.  [name], Blanchard Kelch

## 2013-07-02 NOTE — Progress Notes (Signed)
Clinical Social Work Department CLINICAL SOCIAL WORK PLACEMENT NOTE 07/02/2013  Patient:  [name], [name]  Account Number:  [id number] Admit date:  06/26/2013  Clinical Social Worker:  Dorathy Daft STOVER, CLINICAL SOCIAL WORKER  Date/time:  07/01/2013 02:54 PM  Clinical Social Work is seeking post-discharge placement for this patient at the following level of care:   SKILLED NURSING   (*CSW will update this form in Epic as items are completed)   07/01/2013  Patient/family provided with [facility] Department of Clinical Social Work's list of facilities offering this level of care within the geographic area requested by the patient (or if unable, by the patient's family).  07/01/2013  Patient/family informed of their freedom to choose among providers that offer the needed level of care, that participate in Medicare, Medicaid or managed care program needed by the patient, have an available bed and are willing to accept the patient.  07/01/2013  Patient/family informed of [facility]' ownership interest in [facility], as well as of the fact that they are under no obligation to receive care at this facility.  PASARR submitted to EDS on 07/01/2013 PASARR number received from EDS on 07/02/2013  FL2 transmitted to all facilities in geographic area requested by pt/family on  07/01/2013 FL2 transmitted to all facilities within larger geographic area on   Patient informed that his/her managed care company has contracts with or will negotiate with  certain facilities, including the following:     Patient/family informed of bed offers received:  07/02/2013 Patient chooses bed at [facility] Physician recommends and patient chooses bed at    Patient to be transferred to [facility] on  07/02/2013 Patient to be transferred to facility by FAMILY  The following physician request were entered in Epic:   Additional Comments:  Maree Krabbe,  MSW, Theresia Majors [phone]

## 2013-07-02 NOTE — Progress Notes (Signed)
Patient ID: [name], female   DOB: 06-Mar-1946, 67 y.o.   MRN: [id number] Patient is comfortable. Less soreness.  Right axillary and groin incisions healing. Some bruising in the right axillary incision which is stable. Palpable right accident graft pulse and 2+ right dorsalis pedis pulse.  Ready for discharge to home for skilled nursing facility from vascular standpoint. Apparently conversation with the patient's daughter last evening suggested possible need for skilled nursing facility placement. The patient has become increasingly unstable from her baseline at home over the last several months according to the husband on prior discussions. Okay for discharge to home for skilled nursing facility depending on family and patient's wishes today.

## 2013-07-02 NOTE — Progress Notes (Signed)
Occupational Therapy Treatment Patient Details Name: [name] MRN: [id number] DOB: 17-Mar-1946 Today's Date: 07/02/2013 Time: 4098-1191 OT Time Calculation (min): 18 min  OT Assessment / Plan / Recommendation  History of present illness 67 y.o. female admitted to [facility] on 06/26/13 s/p right aillofemoral bypass graft.     OT comments  Pt demonstrates improving activity tolerance, increased balance.  Currently, is min A with BADLs with plan for discharge to SNF  Follow Up Recommendations  Supervision/Assistance - 24 hour    Barriers to Discharge       Equipment Recommendations  None recommended by OT    Recommendations for Other Services    Frequency Min 2X/week   Progress towards OT Goals Progress towards OT goals: Progressing toward goals  Plan Discharge plan remains appropriate    Precautions / Restrictions Precautions Precautions: Fall Precaution Comments: spouse reports she falls 1x/week Restrictions Weight Bearing Restrictions: No   Pertinent Vitals/Pain     ADL  Upper Body Dressing: Set up;Supervision/safety Where Assessed - Upper Body Dressing: Unsupported sitting Lower Body Dressing: Minimal assistance Where Assessed - Lower Body Dressing: Unsupported sit to stand Toilet Transfer: Minimal assistance Toilet Transfer Method: Sit to stand;Stand pivot Toilet Transfer Equipment: Regular height toilet;Grab bars Toileting - Clothing Manipulation and Hygiene: Minimal assistance Where Assessed - Glass blower/designer Manipulation and Hygiene: Standing Transfers/Ambulation Related to ADLs: min A with RW ADL Comments: Pt requires increased time to complete above tasks    OT Diagnosis:    OT Problem List:   OT Treatment Interventions:     OT Goals(current goals can now be found in the care plan section) Acute Rehab OT Goals Time For Goal Achievement: 07/14/13 Potential to Achieve Goals: Good ADL Goals Pt Will Perform Grooming: with supervision;standing Pt Will  Perform Lower Body Bathing: sit to/from stand;with supervision Pt Will Perform Upper Body Dressing: with supervision;sitting Pt Will Perform Lower Body Dressing: with supervision;sit to/from stand Pt Will Perform Tub/Shower Transfer: Tub transfer;with supervision;tub bench Additional ADL Goal #1: Pt will complete all toileting with S on raised commode.  Visit Information  Last OT Received On: 07/02/13 Assistance Needed: +1 History of Present Illness: 67 y.o. female admitted to [facility] on 06/26/13 s/p right aillofemoral bypass graft.      Subjective Data      Prior Functioning       Cognition  Cognition Arousal/Alertness: Awake/alert Behavior During Therapy: WFL for tasks assessed/performed Overall Cognitive Status: Impaired/Different from baseline Area of Impairment: Problem solving;Memory Memory: Decreased short-term memory Problem Solving: Slow processing    Mobility  Bed Mobility Bed Mobility: Supine to Sit;Sitting - Scoot to Edge of Bed Supine to Sit: 5: Supervision Sitting - Scoot to [place] of Bed: 5: Supervision Transfers Transfers: Sit to Stand;Stand to Sit Sit to Stand: 4: Min assist;With upper extremity assist;From bed;From toilet Stand to Sit: 4: Min assist;With upper extremity assist;To toilet;To bed Details for Transfer Assistance: assist to move into standing from toilet    Exercises      Balance     End of Session OT - End of Session Equipment Utilized During Treatment: Rolling walker Activity Tolerance: Patient tolerated treatment well Patient left: in bed;with call bell/phone within reach;with family/visitor present Nurse Communication: Mobility status  GO     Sampson Self, Ursula Alert M 07/02/2013, 2:27 PM

## 2013-07-02 NOTE — Progress Notes (Signed)
Clinical Social Worker facilitated patient discharge by contacting the patient, family and facility, [facility]. Patient agreeable to this plan and arranging transport via EMS . CSW will sign off, as social work intervention is no longer needed.  Veronnica Hennings, MSW, LCSWA [phone]  

## 2013-07-02 NOTE — Discharge Summary (Signed)
[facility] Discharge Summary   Patient ID:  [name] MRN: [id number] DOB/AGE: April 26, 1946 67 y.o.  Admit date: 06/26/2013 Discharge date: 07/02/2013 Date of Surgery: 06/26/2013 - 06/29/2013 Surgeon: Moishe Spice): Larina Earthly, MD  Admission Diagnosis: PVD  Discharge Diagnoses:  PVD  Secondary Diagnoses: Past Medical History  Diagnosis Date  . PVD (peripheral vascular disease)     status post prior femoral-popliteal bypass  . Back pain   . Arthritis   . CAD (coronary artery disease)     status post prior stenting to the proximal RCA, status post anterior STEMI 10/2008 (RCA occluded at the prior stent, LAD treated with a bare metal stent and a drug-eluting stent),  . Chronic systolic heart failure     echo 11/2011: EF 25-30%, AS and apical AK, trivial AI, mild MR, PASP 34.  . Anemia   . Ischemic cardiomyopathy   . AICD (automatic cardioverter/defibrillator) present   . HLD (hyperlipidemia)   . GERD (gastroesophageal reflux disease)   . RLS (restless legs syndrome)   . Depression   . Tobacco abuse   . Pain in joint, lower leg   . Depressive disorder, not elsewhere classified   . Myocardial infarction 2010    Procedure(s): Right axillary to femoral bypass with 8 mm Hemashield graft  Discharged Condition: good  HPI:  [name] is a 67 y.o. female who presents for evaluation of pain in the left foot with bluish discoloration of left foot. The patient started having symptoms of pain in her left foot 4-5 days ago. The foot hurts continuously. She has no history of ulcerations on the feet. The patient has an extensive vascular reconstructive history. In 2002 she had a left superficial femoral artery to posterior tibial artery bypass with vein. In 2006 she had a right femoral to below-knee popliteal bypass with vein. In 2010 she had a left-to-right femoral-femoral bypass. These were all done by Dr. Hart [place]. The patient was recently seen in January. At  that time her ABIs were normal bilaterally. Other medical problems include coronary artery disease, congestive failure with last ejection fraction 25-30%, AICD. Her cardiac status overall has been stable recently. Unfortunately the patient continues to smoke one and a half pack of cigarettes per day.   Hospital Course: Pt was admitted to hosp for aortogram on 06/26/13: Operative findings: Bilateral external iliac artery occlusions with patent left to right femoral-femoral bypass that by internal iliac collaterals, patent left superficial femoral to posterior tibial artery bypass, patent right femoral to below-knee popliteal bypass. Two-vessel runoff to the left foot primarily PT dominant. Three-vessel runoff to the right foot but poorly opacified. Patent right subclavian and axillary artery. Pt was then taken to the OR on for 06/29/13 Right axillary to femoral bypass with 8 mm Hemashield graft Extubated: POD # 0 Pt doing well. The patient's pre-op symptoms of pain in left toes are Unchanged . Patients pain is well controlled  VASC. LAB Studies:  ABI: Right 0.91; Left 0.78;  Physical exam:Pt is A&O x 3  Pt remains afebrile Right upper extremity: Incision/s is/are clean,dry.intact, and healing with ecchymosis around incision without drainage  Right groin wound with min serosanguinous drainage  Limb is warm; with good color  Post-op wounds healing well Pt. Ambulating, voiding and taking PO diet without difficulty. Pt pain controlled with PO pain meds. Labs as below Complications:none  Consults:  Treatment Team:  Larina Earthly, MD  Significant Diagnostic Studies: CBC Lab Results  Component Value Date  WBC 18.2* 06/30/2013   HGB 10.7* 06/30/2013   HCT 32.7* 06/30/2013   MCV 96.5 06/30/2013   PLT 160 06/30/2013    BMET    Component Value Date/Time   NA 138 06/30/2013 0545   K 3.5 06/30/2013 0545   CL 102 06/30/2013 0545   CO2 25 06/30/2013 0545   GLUCOSE 103* 06/30/2013 0545    BUN 12 06/30/2013 0545   CREATININE 1.11* 06/30/2013 0545   CALCIUM 8.8 06/30/2013 0545   GFRNONAA 51* 06/30/2013 0545   GFRAA 59* 06/30/2013 0545   COAG Lab Results  Component Value Date   INR 0.94 06/29/2013   INR 1.0 ratio 06/01/2009   INR 1.0 04/05/2009     Disposition:  Discharge to :Skilled nursing facility Discharge Orders   Future Appointments Provider Department Dept Phone   07/21/2013 8:45 AM Larina Earthly, MD [facility] [phone]   08/10/2013 9:05 AM [facility] [employer] Office [phone]   10/06/2013 9:30 AM [facility] [place] [facility] [address] [phone]   10/06/2013 10:00 AM [facility] [place] [facility] [address] [phone]   10/06/2013 11:20 AM Carma Lair Nickel, NP [facility] [phone]   Future Orders Complete By Expires   Call MD for:  redness, tenderness, or signs of infection (pain, swelling, bleeding, redness, odor or green/yellow discharge around incision site)  As directed    Call MD for:  severe or increased pain, loss or decreased feeling  in affected limb(s)  As directed    Call MD for:  temperature >100.5  As directed    Discharge wound care:  As directed    Comments:     Dry dressing to right groin as needed.   Driving Restrictions  As directed    Comments:     No driving for 4 weeks   Increase activity slowly  As directed    Comments:     Walk with assistance use walker or cane as needed   May shower   As directed    may wash over wound with mild soap and water  As directed    Resume previous diet  As directed    Walker   As directed        Medication List    STOP taking these medications       HYDROcodone-acetaminophen 5-500 MG per tablet  Commonly known as:  VICODIN      TAKE these medications       allopurinol 100 MG tablet  Commonly known as:  ZYLOPRIM  Take 100 mg by mouth daily.     aspirin 81 MG  tablet  Take 81 mg by mouth daily.     citalopram 40 MG tablet  Commonly known as:  CELEXA  Take 40 mg by mouth daily.     clonazePAM 1 MG tablet  Commonly known as:  KLONOPIN  Take 0.5-2 mg by mouth 2 (two) times daily as needed. Take 1 tablet in the morning and 2 tablets in the evening     estrogens (conjugated) 0.625 MG tablet  Commonly known as:  PREMARIN  Take 0.625 mg by mouth daily. Take daily for 21 days then do not take for 7 days.     eucerin cream  Apply 1 application topically 2 (two) times daily as needed.     ezetimibe 10 MG tablet  Commonly known as:  ZETIA  Take 10 mg by mouth daily.  ferrous sulfate 325 (65 FE) MG tablet  Take 325 mg by mouth daily with breakfast.     fish oil-omega-3 fatty acids 1000 MG capsule  Take 2 g by mouth daily.     furosemide 40 MG tablet  Commonly known as:  LASIX  Take 40-60 mg by mouth 2 (two) times daily. Take 60mg  in the morning and 40mg  in the evening     metoprolol succinate 50 MG 24 hr tablet  Commonly known as:  TOPROL-XL  Take 1 tablet (50 mg total) by mouth daily.     nitroGLYCERIN 0.4 MG SL tablet  Commonly known as:  NITROSTAT  Place 1 tablet (0.4 mg total) under the tongue every 5 (five) minutes as needed. For chest pain     oxyCODONE 5 MG immediate release tablet  Commonly known as:  Oxy IR/ROXICODONE  Take 1-2 tablets (5-10 mg total) by mouth every 4 (four) hours as needed for pain.     pantoprazole 40 MG tablet  Commonly known as:  PROTONIX  Take 40 mg by mouth daily.     potassium chloride SA 20 MEQ tablet  Commonly known as:  K-DUR,KLOR-CON  Take 20 mEq by mouth every Monday, Wednesday, and Friday.     ramipril 2.5 MG capsule  Commonly known as:  ALTACE  Take 2.5 mg by mouth daily.     rosuvastatin 20 MG tablet  Commonly known as:  CRESTOR  Take 1 tablet (20 mg total) by mouth daily.     Vitamin D 1000 UNITS capsule  Take 1,000 Units by mouth daily.     zolpidem 10 MG tablet  Commonly  known as:  AMBIEN  Take 10 mg by mouth at bedtime as needed.       Verbal and written Discharge instructions given to the patient. Wound care per Discharge AVS     Follow-up Information   Follow up with EARLY, [name], MD In 3 weeks. (office will arrange-sent)    Specialty:  Vascular Surgery   Contact information:   [address] [place] [address] [phone]       Signed: Marlowe Shores 07/02/2013, 10:26 AM

## 2013-07-02 NOTE — Progress Notes (Signed)
Physical Therapy Treatment Patient Details Name: [name] MRN: [id number] DOB: [birth year] Today's Date: 07/02/2013 Time: 0865-7846 PT Time Calculation (min): 30 min  PT Assessment / Plan / Recommendation  History of Present Illness 67 y.o. female admitted to [facility] on 06/26/13 s/p right aillofemoral bypass graft.     PT Comments   Pt making slow progress towards goals.  Pt becomes SOB with ambulation and needs occasional standing rest breaks.  Pt easily distracted during ambulation increasing fall risk due to increase head turns due to distraction.  Dtr continues to want SNF and husband wants pt to return home.  Pt has history of multiple falls and may benefit from further therapy prior to d/c home for overall balance and endurance and decrease fall risk.    Follow Up Recommendations  SNF;Supervision/Assistance - 24 hour;Home health PT     Equipment Recommendations    RW   Frequency Min 3X/week   Progress towards PT Goals  progressing   Plan Current plan remains appropriate    Precautions / Restrictions Precautions Precautions: Fall Precaution Comments: spouse reports she falls 1x/week Restrictions Weight Bearing Restrictions: No   Pertinent Vitals/Pain C/o left 3rd toe pain    Mobility  Bed Mobility Bed Mobility: Supine to Sit;Sit to Supine Supine to Sit: 4: Min guard;With rails;HOB elevated Sit to Supine: 4: Min guard;With rail Details for Bed Mobility Assistance: Heavy use of rail and extra time needed to get in/out of bed Transfers Transfers: Sit to Stand;Stand to Sit Sit to Stand: 4: Min guard;From bed Stand to Sit: 4: Min guard;To bed Details for Transfer Assistance: Minguard for safety with max cues for hand placement and close minguard for overall balance Ambulation/Gait Ambulation/Gait Assistance: 4: Min guard Ambulation Distance (Feet): 250 Feet Assistive device: Rolling walker Ambulation/Gait Assistance Details: Encouraged pt to increase gait speed.  Pt  ambulates with slow gait with noticeable increase SOB towards end of ambulation.  Pt easily distracted causing increase fall risk with several head turns due to distraction.  Gait Pattern: Step-through pattern;Decreased stride length;Shuffle Gait velocity: decreased Stairs: No    Exercises General Exercises - Lower Extremity Long Arc Quad: AROM;Both;10 reps;Seated Hip ABduction/ADduction: AROM;Both;10 reps;Supine Straight Leg Raises: AAROM;Strengthening;Both;10 reps Hip Flexion/Marching: AROM;Both;10 reps;Seated   PT Diagnosis:    PT Problem List:   PT Treatment Interventions:     PT Goals (current goals can now be found in the care plan section) Acute Rehab PT Goals Patient Stated Goal: home  Visit Information  Last PT Received On: 07/02/13 Assistance Needed: +1 History of Present Illness: 67 y.o. female admitted to [facility] on 06/26/13 s/p right aillofemoral bypass graft.      Subjective Data  Subjective: "I hope they hurry up and tell me where I'm going or I'm going home." (Pt reports her husband wants her to come home and not go to ) Patient Stated Goal: home   Cognition  Cognition Arousal/Alertness: Awake/alert Behavior During Therapy: WFL for tasks assessed/performed Overall Cognitive Status: Impaired/Different from baseline Problem Solving: Slow processing General Comments: Pt easily distracted and needs cues for redirection to task.  Pt needs extra time to process information.     Balance     End of Session PT - End of Session Equipment Utilized During Treatment: Gait belt Activity Tolerance: Patient tolerated treatment well Patient left: in bed;with call bell/phone within reach Nurse Communication: Mobility status   GP     Marvion Bastidas 07/02/2013, 9:33 AM  Jake Shark, PT DPT [phone]

## 2013-07-02 NOTE — Progress Notes (Signed)
07/02/2013 3:21 PM Nursing note Discharge avs form, medications already taken today and those due this evening given and explained to patient and husband. Report called to [facility] as well. D/c [facility] earlier in shift. Site unremarkable. D/c tele. Follow up appointments, incision site care and when to call MD reviewed. D/c to SNF via ambulance per orders.  Marshelle Bilger, Blanchard Kelch

## 2013-07-20 ENCOUNTER — Encounter: Payer: Self-pay | Admitting: Vascular Surgery

## 2013-07-21 ENCOUNTER — Ambulatory Visit (INDEPENDENT_AMBULATORY_CARE_PROVIDER_SITE_OTHER): Payer: Medicare Other | Admitting: Vascular Surgery

## 2013-07-21 ENCOUNTER — Encounter: Payer: Self-pay | Admitting: Vascular Surgery

## 2013-07-21 VITALS — BP 134/64 | HR 71 | Ht 63.5 in | Wt 135.0 lb

## 2013-07-21 DIAGNOSIS — I739 Peripheral vascular disease, unspecified: Secondary | ICD-10-CM

## 2013-07-21 DIAGNOSIS — Z48812 Encounter for surgical aftercare following surgery on the circulatory system: Secondary | ICD-10-CM

## 2013-07-21 NOTE — Progress Notes (Signed)
The patient has today for followup of her right axillary to femoral bypass for critical ischemia. His had multiple prior revascularizations with Dr. Hart Rochester. These included a left to right fem-fem and a left femoral to popliteal bypass. She presented with critical ischemia bilaterally with tissue loss on the toes of her third toe on the left. These were superficial. 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 She underwent urgent right axillary to femoral bypass and did well in the hospital. She is here today for initial followup. Surgery was on 06/29/2013. Her groin access incisions are completely healed. She has excellent asked them and fem-fem bypass pulse. Well-perfused and she has separating callus on the toes that have been ischemic. She will continue usual activity will see Dr. Hart Rochester in 3 months for continued followup

## 2013-07-21 NOTE — Addendum Note (Signed)
Addended by: Brannon Levene K on: 07/21/2013 09:11 AM   Modules accepted: Orders  

## 2013-08-10 ENCOUNTER — Encounter: Payer: Self-pay | Admitting: Internal Medicine

## 2013-08-10 ENCOUNTER — Other Ambulatory Visit: Payer: Self-pay | Admitting: Internal Medicine

## 2013-08-10 ENCOUNTER — Ambulatory Visit (INDEPENDENT_AMBULATORY_CARE_PROVIDER_SITE_OTHER): Payer: Medicare Other | Admitting: *Deleted

## 2013-08-10 DIAGNOSIS — I5022 Chronic systolic (congestive) heart failure: Secondary | ICD-10-CM

## 2013-08-10 DIAGNOSIS — I428 Other cardiomyopathies: Secondary | ICD-10-CM

## 2013-08-16 LAB — MDC_IDC_ENUM_SESS_TYPE_REMOTE
Battery Remaining Longevity: 77 mo
Battery Remaining Percentage: 66 %
Brady Statistic RV Percent Paced: 1 %
Date Time Interrogation Session: 20141124081717
HighPow Impedance: 48 Ohm
Implantable Pulse Generator Serial Number: 726698
Lead Channel Pacing Threshold Amplitude: 1 V
Lead Channel Pacing Threshold Pulse Width: 0.4 ms
Lead Channel Setting Pacing Amplitude: 2.5 V
Zone Setting Detection Interval: 250 ms
Zone Setting Detection Interval: 335 ms

## 2013-08-21 ENCOUNTER — Encounter: Payer: Self-pay | Admitting: *Deleted

## 2013-09-01 ENCOUNTER — Ambulatory Visit: Payer: Medicare Other | Attending: Family Medicine | Admitting: Physical Therapy

## 2013-09-01 DIAGNOSIS — IMO0001 Reserved for inherently not codable concepts without codable children: Secondary | ICD-10-CM | POA: Insufficient documentation

## 2013-09-01 DIAGNOSIS — R269 Unspecified abnormalities of gait and mobility: Secondary | ICD-10-CM | POA: Insufficient documentation

## 2013-09-02 ENCOUNTER — Telehealth: Payer: Self-pay

## 2013-09-02 NOTE — Telephone Encounter (Signed)
Phone call from pt. C/o having swelling in feet and legs, with left > right.  States the legs are swelling from foot to knee, bilaterally.  Reports 2 recent falls within past 3 weeks; she lost her balance, and fell backwards.  C/o pain across lower back, since falling.  Denies any numbness or tingling in lower extremities.  Denies any change in color or temperature of lower extremities.  Denies any open sores.  States the swelling of her legs improves overnight, and slowly increases during the day.  Advised to elevate the legs above level of the heart, at intervals during the day.  Encouraged to call her PCP for appt. to further evaluate back pain, since recent falls.  Pt. verb. understanding.  States she will call her PCP.  Also, states she is receiving Physical Therapy for balance and strengthening.   Advised to call office if has other concerns.

## 2013-09-03 ENCOUNTER — Ambulatory Visit: Payer: Medicare Other | Admitting: Physical Therapy

## 2013-09-08 ENCOUNTER — Ambulatory Visit: Payer: Medicare Other | Admitting: Physical Therapy

## 2013-09-15 ENCOUNTER — Emergency Department (HOSPITAL_COMMUNITY)
Admission: EM | Admit: 2013-09-15 | Discharge: 2013-09-15 | Disposition: A | Discharge status: Home / Self Care | Payer: Medicare Other | Attending: Emergency Medicine | Admitting: Emergency Medicine

## 2013-09-15 ENCOUNTER — Ambulatory Visit: Payer: Medicare Other | Admitting: Physical Therapy

## 2013-09-15 ENCOUNTER — Emergency Department (HOSPITAL_COMMUNITY): Payer: Medicare Other

## 2013-09-15 ENCOUNTER — Encounter (HOSPITAL_COMMUNITY): Payer: Self-pay | Admitting: Emergency Medicine

## 2013-09-15 DIAGNOSIS — I5022 Chronic systolic (congestive) heart failure: Secondary | ICD-10-CM | POA: Insufficient documentation

## 2013-09-15 DIAGNOSIS — I2589 Other forms of chronic ischemic heart disease: Secondary | ICD-10-CM | POA: Insufficient documentation

## 2013-09-15 DIAGNOSIS — M129 Arthropathy, unspecified: Secondary | ICD-10-CM | POA: Insufficient documentation

## 2013-09-15 DIAGNOSIS — R110 Nausea: Secondary | ICD-10-CM | POA: Insufficient documentation

## 2013-09-15 DIAGNOSIS — Z79899 Other long term (current) drug therapy: Secondary | ICD-10-CM | POA: Insufficient documentation

## 2013-09-15 DIAGNOSIS — R609 Edema, unspecified: Secondary | ICD-10-CM | POA: Insufficient documentation

## 2013-09-15 DIAGNOSIS — I2510 Atherosclerotic heart disease of native coronary artery without angina pectoris: Secondary | ICD-10-CM | POA: Insufficient documentation

## 2013-09-15 DIAGNOSIS — Z7982 Long term (current) use of aspirin: Secondary | ICD-10-CM | POA: Insufficient documentation

## 2013-09-15 DIAGNOSIS — D649 Anemia, unspecified: Secondary | ICD-10-CM | POA: Insufficient documentation

## 2013-09-15 DIAGNOSIS — Z9861 Coronary angioplasty status: Secondary | ICD-10-CM | POA: Insufficient documentation

## 2013-09-15 DIAGNOSIS — F3289 Other specified depressive episodes: Secondary | ICD-10-CM | POA: Insufficient documentation

## 2013-09-15 DIAGNOSIS — X58XXXA Exposure to other specified factors, initial encounter: Secondary | ICD-10-CM | POA: Insufficient documentation

## 2013-09-15 DIAGNOSIS — I739 Peripheral vascular disease, unspecified: Secondary | ICD-10-CM | POA: Insufficient documentation

## 2013-09-15 DIAGNOSIS — Y939 Activity, unspecified: Secondary | ICD-10-CM | POA: Insufficient documentation

## 2013-09-15 DIAGNOSIS — R233 Spontaneous ecchymoses: Secondary | ICD-10-CM | POA: Insufficient documentation

## 2013-09-15 DIAGNOSIS — Z951 Presence of aortocoronary bypass graft: Secondary | ICD-10-CM | POA: Insufficient documentation

## 2013-09-15 DIAGNOSIS — G2581 Restless legs syndrome: Secondary | ICD-10-CM | POA: Insufficient documentation

## 2013-09-15 DIAGNOSIS — Z950 Presence of cardiac pacemaker: Secondary | ICD-10-CM | POA: Insufficient documentation

## 2013-09-15 DIAGNOSIS — E785 Hyperlipidemia, unspecified: Secondary | ICD-10-CM | POA: Insufficient documentation

## 2013-09-15 DIAGNOSIS — R0789 Other chest pain: Secondary | ICD-10-CM | POA: Insufficient documentation

## 2013-09-15 DIAGNOSIS — I252 Old myocardial infarction: Secondary | ICD-10-CM | POA: Insufficient documentation

## 2013-09-15 DIAGNOSIS — K219 Gastro-esophageal reflux disease without esophagitis: Secondary | ICD-10-CM | POA: Insufficient documentation

## 2013-09-15 DIAGNOSIS — R079 Chest pain, unspecified: Secondary | ICD-10-CM

## 2013-09-15 DIAGNOSIS — F17200 Nicotine dependence, unspecified, uncomplicated: Secondary | ICD-10-CM | POA: Insufficient documentation

## 2013-09-15 DIAGNOSIS — F329 Major depressive disorder, single episode, unspecified: Secondary | ICD-10-CM | POA: Insufficient documentation

## 2013-09-15 DIAGNOSIS — Z95810 Presence of automatic (implantable) cardiac defibrillator: Secondary | ICD-10-CM | POA: Insufficient documentation

## 2013-09-15 DIAGNOSIS — Y929 Unspecified place or not applicable: Secondary | ICD-10-CM | POA: Insufficient documentation

## 2013-09-15 DIAGNOSIS — IMO0002 Reserved for concepts with insufficient information to code with codable children: Secondary | ICD-10-CM | POA: Insufficient documentation

## 2013-09-15 LAB — CBC
HCT: 37.4 % (ref 36.0–46.0)
Hemoglobin: 11.9 g/dL — ABNORMAL LOW (ref 12.0–15.0)
MCH: 31.6 pg (ref 26.0–34.0)
MCHC: 31.8 g/dL (ref 30.0–36.0)
MCV: 99.2 fL (ref 78.0–100.0)
Platelets: 160 K/uL (ref 150–400)
RBC: 3.77 MIL/uL — ABNORMAL LOW (ref 3.87–5.11)
RDW: 15.1 % (ref 11.5–15.5)
WBC: 11.9 K/uL — ABNORMAL HIGH (ref 4.0–10.5)

## 2013-09-15 LAB — BASIC METABOLIC PANEL
BUN: 23 mg/dL (ref 6–23)
CO2: 24 mEq/L (ref 19–32)
Calcium: 9.4 mg/dL (ref 8.4–10.5)
Chloride: 100 mEq/L (ref 96–112)
Creatinine, Ser: 1.35 mg/dL — ABNORMAL HIGH (ref 0.50–1.10)
GFR calc Af Amer: 46 mL/min — ABNORMAL LOW (ref >90)
GFR calc non Af Amer: 40 mL/min — ABNORMAL LOW (ref >90)
Glucose, Bld: 113 mg/dL — ABNORMAL HIGH (ref 70–99)
Potassium: 4.4 mEq/L (ref 3.7–5.3)
Sodium: 139 mEq/L (ref 137–147)

## 2013-09-15 LAB — PROTIME-INR
INR: 0.89 (ref 0.00–1.49)
Prothrombin Time: 11.9 s (ref 11.6–15.2)

## 2013-09-15 LAB — POCT I-STAT TROPONIN I: Troponin i, poc: 0 ng/mL (ref 0.00–0.08)

## 2013-09-15 LAB — PRO B NATRIURETIC PEPTIDE: Pro B Natriuretic peptide (BNP): 4023 pg/mL — ABNORMAL HIGH (ref 0–125)

## 2013-09-15 MED ORDER — ONDANSETRON HCL 4 MG/2ML IJ SOLN
4.0000 mg | Freq: Once | INTRAMUSCULAR | Status: AC
  Administered 2013-09-15: 4 mg via INTRAVENOUS
  Filled 2013-09-15: qty 2

## 2013-09-15 MED ORDER — ASPIRIN 81 MG PO CHEW
324.0000 mg | CHEWABLE_TABLET | Freq: Once | ORAL | Status: AC
  Administered 2013-09-15: 324 mg via ORAL
  Filled 2013-09-15: qty 4

## 2013-09-15 MED ORDER — NITROGLYCERIN 0.4 MG SL SUBL
0.4000 mg | SUBLINGUAL_TABLET | SUBLINGUAL | Status: DC | PRN
Start: 2013-09-15 — End: 2013-09-15
  Administered 2013-09-15: 0.4 mg via SUBLINGUAL
  Filled 2013-09-15: qty 25

## 2013-09-15 NOTE — Consult Note (Signed)
Patient ID: [name] MRN: [id number], DOB/AGE: 1946/04/02   Admit date: 09/15/2013   Primary Physician: Johny Blamer, MD Primary Cardiologist: Dr. Ladona Ridgel  Pt. Profile: [name] is a 67 year old woman with CAD s/p multiple stents, chronic PVD s/p multiple stents, ICM- s/p AICD, chronic systolic heart failure (EF25-30% by 2013 ECHO)- status post ICD implantation, hypertension, hyperlipidemia, GERD and ongoing tobacco abuse who is being evaluated today for chest pain.  She has a history CAD, s/p stenting to the proximal RCA, s/p ant STEMI 10/2008 (RCA occluded at the prior stent, LAD treated with a BMS and a DES.) Patient recently had R axillary to femoral bypass for critical ischemia on 06/29/2013. She has had multiple prior revascularizations including a left to right fem-fem and a left femoral to popliteal bypass.   Yesterday afternoon she experienced sharp, intermittent,10/10 chest pain that lasted until ~6pm when she took a pain pill and went to bed. It was associated with SOB and lightheadedness, although she admits that she is chronically SOB. She denies radiation, diaphoresis, n/v, palpitations. She was able to sleep and woke up this morning with some chest heaviness. Additionally, she has noticed lower extremity and pedal edema since her last bypass surgery. L>R Chronc orthopnea and PND. Still smoking 1ppd. She went to physical therapy this morning but was referred to the emergency room when she described her symptoms to her therapist.    Troponin x1 neg, EKG with no acute changes.  Problem List  Past Medical History  Diagnosis Date  . PVD (peripheral vascular disease)     status post prior femoral-popliteal bypass  . Back pain   . Arthritis   . CAD (coronary artery disease)     status post prior stenting to the proximal RCA, status post anterior STEMI 10/2008 (RCA occluded at the prior stent, LAD treated with a bare metal stent and a drug-eluting stent),  . Chronic  systolic heart failure     echo 11/2011: EF 25-30%, AS and apical AK, trivial AI, mild MR, PASP 34.  . Anemia   . Ischemic cardiomyopathy   . AICD (automatic cardioverter/defibrillator) present   . HLD (hyperlipidemia)   . GERD (gastroesophageal reflux disease)   . RLS (restless legs syndrome)   . Depression   . Tobacco abuse   . Pain in joint, lower leg   . Depressive disorder, not elsewhere classified   . Myocardial infarction 2010    Past Surgical History  Procedure Laterality Date  . Visual merchandiser    . Foot surgery    . Pr vein bypass graft,aorto-fem-pop      fem fem  04/07/09  . Abdominal hysterectomy    . Cardiac defibrillator placement    . Eye surgery  2013    Cataract bilateral   . Axillary-femoral bypass graft Right 06/29/2013    Procedure: BYPASS GRAFT AXILLA-RIGHT FEMORAL;  Surgeon: Larina Earthly, MD;  Location: [facility] OR;  Service: Vascular;  Laterality: Right;     Allergies  Allergies  Allergen Reactions  . Naproxen Other (See Comments)    Blurred vision  . Prednisone Other (See Comments)    Mean to her grandbabies    Home Medications  Prior to Admission medications   Medication Sig Start Date End Date Taking? Authorizing Provider  aspirin 81 MG tablet Take 81 mg by mouth daily.     Yes Historical Provider, MD  Cholecalciferol (VITAMIN D) 1000 UNITS capsule Take 1,000 Units by mouth daily.  Yes Historical Provider, MD  citalopram (CELEXA) 40 MG tablet Take 40 mg by mouth daily.   Yes Historical Provider, MD  clonazePAM (KLONOPIN) 1 MG tablet Take 0.5-2 mg by mouth 2 (two) times daily as needed. Take 1 tablet in the morning and 2 tablets in the evening   Yes Historical Provider, MD  estrogens, conjugated, (PREMARIN) 0.625 MG tablet Take 0.625 mg by mouth daily. Take daily for 21 days then do not take for 7 days.   Yes Historical Provider, MD  ezetimibe (ZETIA) 10 MG tablet Take 10 mg by mouth daily.     Yes Historical Provider, MD  ferrous sulfate 325 (65 FE) MG  tablet Take 325 mg by mouth daily with breakfast.     Yes Historical Provider, MD  fish oil-omega-3 fatty acids 1000 MG capsule Take 2 g by mouth daily.   Yes Historical Provider, MD  furosemide (LASIX) 40 MG tablet Take 40-60 mg by mouth 2 (two) times daily. Take 60mg  in the morning and 40mg  in the evening   Yes Historical Provider, MD  metoprolol succinate (TOPROL-XL) 50 MG 24 hr tablet Take 1 tablet (50 mg total) by mouth daily. 02/22/12  Yes Marinus Maw, MD  nitroGLYCERIN (NITROSTAT) 0.4 MG SL tablet Place 1 tablet (0.4 mg total) under the tongue every 5 (five) minutes as needed. For chest pain 12/07/11  Yes Beatrice Lecher, PA-C  oxyCODONE (OXY IR/ROXICODONE) 5 MG immediate release tablet Take 1-2 tablets (5-10 mg total) by mouth every 4 (four) hours as needed for pain. 07/01/13  Yes Suzanne L Nickel, NP  pantoprazole (PROTONIX) 40 MG tablet Take 40 mg by mouth daily.     Yes Historical Provider, MD  potassium chloride SA (K-DUR,KLOR-CON) 20 MEQ tablet Take 20 mEq by mouth every Monday, Wednesday, and Friday.   Yes Historical Provider, MD  ramipril (ALTACE) 2.5 MG capsule Take 2.5 mg by mouth daily.   Yes Historical Provider, MD  rosuvastatin (CRESTOR) 20 MG tablet Take 1 tablet (20 mg total) by mouth daily. 08/14/11  Yes Marinus Maw, MD  Skin Protectants, Misc. (EUCERIN) cream Apply 1 application topically 2 (two) times daily as needed for dry skin.    Yes Historical Provider, MD  zolpidem (AMBIEN) 10 MG tablet Take 10 mg by mouth at bedtime as needed.     Yes Historical Provider, MD    Family History  Family History  Problem Relation Age of Onset  . Peripheral vascular disease Sister   . Heart disease Brother   . Diabetes Mother   . Heart attack Father     Social History  History   Social History  . Marital Status: Married    Spouse Name: N/A    Number of Children: N/A  . Years of Education: N/A   Occupational History  . Not on file.   Social History Main Topics  .  Smoking status: Current Every Day Smoker -- 1.00 packs/day for 55 years    Types: Cigarettes  . Smokeless tobacco: Never Used  . Alcohol Use: No  . Drug Use: No  . Sexual Activity: Not on file   Other Topics Concern  . Not on file   Social History Narrative  . No narrative on file     Review of Systems General:  No chills, fever, night sweats or weight changes.  Cardiovascular:  No chest pain, dyspnea on exertion, edema, orthopnea, palpitations, paroxysmal nocturnal dyspnea. Dermatological: No rash, lesions/masses Respiratory: No cough, dyspnea Urologic: No  hematuria, dysuria Abdominal:   No nausea, vomiting, diarrhea, bright red blood per rectum, melena, or hematemesis Neurologic:  No visual changes, wkns, changes in mental status. All other systems reviewed and are otherwise negative except as noted above.  Physical Exam  Blood pressure 125/70, pulse 61, resp. rate 16, SpO2 100.00%.  Constitutional: She appears well-developed and well-nourished. No distress.  Head: Normocephalic and atraumatic.  Eyes: Conjunctivae are normal. Pupils are equal, round, and reactive to light. Right eye exhibits no discharge.  Neck: Neck supple. Negative for flat bilateral carotid bruits left greater than right;  some delay of the carotid upstroke Cardiovascular: Normal rate, regular rhythm and normal heart sounds. Exam reveals no gallop and no friction rub.  Over 6 systolic murmur with radiation into the carotids bilaterally versus bilateral carotid bruit  Pulmonary/Chest: Breath sounds normal. No respiratory distress. +tenderness Mild tachypnea. No accessory muscle usage. Speaking in complete sentences. Mild tenderness to palpation over the mid to lower sternum. Abdominal: Soft. She exhibits no distension. There is no tenderness.  Musculoskeletal: She exhibits no edema and no tenderness.  Mild lower extremity edema, left worse than right. Extremities are warm and appear adequately perfused. She  has scattered ecchymosis and some superficial abrasions to bilateral shins.  Neurological: She is alert.  Skin: Skin is warm and dry.  Psychiatric: She has a normal mood and affect. Her behavior is normal. Thought content normal.  .  Labs   Lab Results  Component Value Date   WBC 11.9* 09/15/2013   HGB 11.9* 09/15/2013   HCT 37.4 09/15/2013   MCV 99.2 09/15/2013   PLT 160 09/15/2013     Recent Labs Lab 09/15/13 1030  NA 139  K 4.4  CL 100  CO2 24  BUN 23  CREATININE 1.35*  CALCIUM 9.4  GLUCOSE 113*     Radiology/Studies  Dg Chest 2 View  09/15/2013   CLINICAL DATA:  Chest pain.  Difficulty breathing.  Short of breath.  EXAM: CHEST  2 VIEW  COMPARISON:  10/20/2012  FINDINGS: Pacemaker/AICD remains in place. Heart is at the upper limits of normal in size. Calcification and unfolding of the thoracic aorta again noted. Coronary artery stent in place. The pulmonary vascularity is normal. The lungs are clear. No effusions. No significant bony finding.  IMPRESSION: No active disease evident. Atherosclerosis. Pacemaker/ AICD. Coronary stent.      Study Date: 11/23/2011 ------------------------------------------------------------ LV EF: 25% - 30% ---------------------------------------------------------- Indications: CHF - 428.0. Dizziness 780.4. ------------------------------------------------------------ History: PMH: Dyspnea. Coronary artery disease. Risk factors: Current tobacco use. Hypertension. Dyslipidemia. ------------------------------------------------------------ Study Conclusions - Left ventricle: The cavity size was moderately dilated. Wall thickness was normal. Systolic function was severely reduced. The estimated ejection fraction was in the range of 25% to 30%. Akinesis of the entireanteroseptal myocardium. Akinesis of the entireapical myocardium. There was an increased relative contribution of atrial contraction to ventricular filling. - Aortic valve:  Trivial regurgitation. - Mitral valve: Mild regurgitation. - Pulmonary arteries: PA peak pressure: 34mm Hg (S).   ECG  Sinus brady HR 57. LAE.   ASSESSMENT AND PLAN  Mrs. Mauney is a 67 year old woman with CAD s/p multiple stents, chronic PVD s/p multiple stents, ICM- s/p AICD, chronic systolic heart failure (EF25-30% by 2013 ECHO)- status post ICD implantation, hypertension, hyperlipidemia, GERD and ongoing tobacco abuse who is being evaluated today for chest pain.   Chest pain- Patient is with atypical chest pain. It is sharp, intermittent,10/10 chest pain that comes and goes over the past 5 years.  Troponin negative, EKG no acute changes and she has no pain currently.  She went to PT today and mentioned worsening LE swelling, L>R and she was sent to the emergency department. She was seen and evaluated by Dr. Graciela Husbands who has deemed her stable for discharge from a cardiology standpoint.  Lower Extremity Edema- -- Bilateral LE edema L>R-- associated with pain  -- Will sign out to Emergency medicine team to manage further care.     Urban Gibson, PA-C 09/15/2013, 2:27 PM  Pager [phone] The patient presents with sharp fleeting chest pains which have been recurrent over the last 5 years. He lasts seconds at a time and almost certainly noncardiac. She has a medication that she uses for at home when it has been problematic. She was referred today because of asymmetric edema. It is reasonable to undertake venous Dopplers to exclude pulmonary embolism. Will defer this further evaluation emergency room. Do not think that she needs to be admitted for a cardiac point of view.

## 2013-09-15 NOTE — ED Notes (Signed)
MD at bedside. 

## 2013-09-15 NOTE — ED Notes (Signed)
Pt c/o midsternal to left sided CP intermittently x 2 days; pt sts some SOB but not more than normal; pt sts swelling in legs

## 2013-09-15 NOTE — ED Notes (Signed)
Pt had an MI 5 years ago. Chest pressure has been occuring since last night. Pt's chest is tender.

## 2013-09-15 NOTE — ED Provider Notes (Signed)
CSN: [id number]     Arrival date & time 09/15/13  1021 History   First MD Initiated Contact with Patient 09/15/13 1111     Chief Complaint  Patient presents with  . Chest Pain   (Consider location/radiation/quality/duration/timing/severity/associated sxs/prior Treatment) HPI  67yF with CP. Complicated PMHx including PVD s/p fem-pop bypass and more recently right axillary to femoral bypass in October, chronic systolic HF, ischemic cardiomyopathy status post ICD implantation.    Last night "pin pricks" in the center of her chest. Intermittent and episodes brief lasting up to a minute at most. No appreciable exacerbating or relieving factors. This morning she was having different chest pain. She describes a heaviness in the center chest. She no longer feels the sharp sensations she was having last night. She's been feeling nauseated today, but no vomiting. Feels more fatigued than usual over the past several days. She went to get physical therapy but was referred to the emergency room when she described her symptoms to her therapist. She has chronic dyspnea and denies any acute change. Her LE swelling is not significantly worse than it typically is. Reports compliance with her medications. No fever or chills. Occasional nonproductive cough.  No syncope, presyncopal symptoms and no recent ICD shocks.      Past Medical History  Diagnosis Date  . PVD (peripheral vascular disease)     status post prior femoral-popliteal bypass  . Back pain   . Arthritis   . CAD (coronary artery disease)     status post prior stenting to the proximal RCA, status post anterior STEMI 10/2008 (RCA occluded at the prior stent, LAD treated with a bare metal stent and a drug-eluting stent),  . Chronic systolic heart failure     echo 11/2011: EF 25-30%, AS and apical AK, trivial AI, mild MR, PASP 34.  . Anemia   . Ischemic cardiomyopathy   . AICD (automatic cardioverter/defibrillator) present   . HLD (hyperlipidemia)    . GERD (gastroesophageal reflux disease)   . RLS (restless legs syndrome)   . Depression   . Tobacco abuse   . Pain in joint, lower leg   . Depressive disorder, not elsewhere classified   . Myocardial infarction 2010   Past Surgical History  Procedure Laterality Date  . Visual merchandiser    . Foot surgery    . Pr vein bypass graft,aorto-fem-pop      fem fem  04/07/09  . Abdominal hysterectomy    . Cardiac defibrillator placement    . Eye surgery  2013    Cataract bilateral   . Axillary-femoral bypass graft Right 06/29/2013    Procedure: BYPASS GRAFT AXILLA-RIGHT FEMORAL;  Surgeon: Larina Earthly, MD;  Location: [facility] OR;  Service: Vascular;  Laterality: Right;   Family History  Problem Relation Age of Onset  . Peripheral vascular disease Sister   . Heart disease Brother   . Diabetes Mother   . Heart attack Father    History  Substance Use Topics  . Smoking status: Current Every Day Smoker -- 1.00 packs/day for 55 years    Types: Cigarettes  . Smokeless tobacco: Never Used  . Alcohol Use: No   OB History   Grav Para Term Preterm Abortions TAB SAB Ect Mult Living                 Review of Systems  All systems reviewed and negative, other than as noted in HPI.   Allergies  Naproxen and Prednisone  Home Medications  Current Outpatient Rx  Name  Route  Sig  Dispense  Refill  . estrogens, conjugated, (PREMARIN) 0.625 MG tablet   Oral   Take 0.625 mg by mouth daily. Take daily for 21 days then do not take for 7 days.         Marland Kitchen allopurinol (ZYLOPRIM) 100 MG tablet   Oral   Take 100 mg by mouth daily.           Marland Kitchen aspirin 81 MG tablet   Oral   Take 81 mg by mouth daily.           . Cholecalciferol (VITAMIN D) 1000 UNITS capsule   Oral   Take 1,000 Units by mouth daily.           . citalopram (CELEXA) 40 MG tablet   Oral   Take 40 mg by mouth daily.         . clonazePAM (KLONOPIN) 1 MG tablet   Oral   Take 0.5-2 mg by mouth 2 (two) times daily as  needed. Take 1 tablet in the morning and 2 tablets in the evening         . ezetimibe (ZETIA) 10 MG tablet   Oral   Take 10 mg by mouth daily.           . ferrous sulfate 325 (65 FE) MG tablet   Oral   Take 325 mg by mouth daily with breakfast.           . fish oil-omega-3 fatty acids 1000 MG capsule   Oral   Take 2 g by mouth daily.         . furosemide (LASIX) 40 MG tablet   Oral   Take 40-60 mg by mouth 2 (two) times daily. Take 60mg  in the morning and 40mg  in the evening         . metoprolol succinate (TOPROL-XL) 50 MG 24 hr tablet   Oral   Take 1 tablet (50 mg total) by mouth daily.   90 tablet   3   . nitroGLYCERIN (NITROSTAT) 0.4 MG SL tablet   Sublingual   Place 1 tablet (0.4 mg total) under the tongue every 5 (five) minutes as needed. For chest pain   25 tablet   2   . oxyCODONE (OXY IR/ROXICODONE) 5 MG immediate release tablet   Oral   Take 1-2 tablets (5-10 mg total) by mouth every 4 (four) hours as needed for pain.   30 tablet   0   . pantoprazole (PROTONIX) 40 MG tablet   Oral   Take 40 mg by mouth daily.           . potassium chloride SA (K-DUR,KLOR-CON) 20 MEQ tablet   Oral   Take 20 mEq by mouth every Monday, Wednesday, and Friday.         . ramipril (ALTACE) 2.5 MG capsule   Oral   Take 2.5 mg by mouth daily.         . rosuvastatin (CRESTOR) 20 MG tablet   Oral   Take 1 tablet (20 mg total) by mouth daily.   30 tablet   9   . Skin Protectants, Misc. (EUCERIN) cream   Topical   Apply 1 application topically 2 (two) times daily as needed.         . zolpidem (AMBIEN) 10 MG tablet   Oral   Take 10 mg by mouth at bedtime as needed.  BP 133/56  Pulse 56  Temp(Src) 99 F (37.2 C) (Oral)  Resp 14  SpO2 100% Physical Exam  Nursing note and vitals reviewed. Constitutional: She appears well-developed and well-nourished. No distress.  HENT:  Head: Normocephalic and atraumatic.  Eyes: Conjunctivae are normal.  Pupils are equal, round, and reactive to light. Right eye exhibits no discharge. Left eye exhibits no discharge.  Neck: Neck supple.  Cardiovascular: Normal rate, regular rhythm and normal heart sounds.  Exam reveals no gallop and no friction rub.   No murmur heard. Pulmonary/Chest: Breath sounds normal. No respiratory distress. She exhibits tenderness.  Mild tachypnea. No accessory muscle usage. Speaking in complete sentences. Mild tenderness to palpation over the mid to lower sternum.   Abdominal: Soft. She exhibits no distension. There is no tenderness.  Musculoskeletal: She exhibits no edema and no tenderness.  Mild lower extremity edema, left worse than right. Extremities are warm and appear adequately perfused. She has scattered ecchymosis and some superficial abrasions to bilateral shins.  Neurological: She is alert.  Skin: Skin is warm and dry.  Psychiatric: She has a normal mood and affect. Her behavior is normal. Thought content normal.    ED Course  Procedures (including critical care time) Labs Review Labs Reviewed  CBC - Abnormal; Notable for the following:    WBC 11.9 (*)    RBC 3.77 (*)    Hemoglobin 11.9 (*)    All other components within normal limits  PRO B NATRIURETIC PEPTIDE - Abnormal; Notable for the following:    Pro B Natriuretic peptide (BNP) 4023.0 (*)    All other components within normal limits  BASIC METABOLIC PANEL - Abnormal; Notable for the following:    Glucose, Bld 113 (*)    Creatinine, Ser 1.35 (*)    GFR calc non Af Amer 40 (*)    GFR calc Af Amer 46 (*)    All other components within normal limits  PROTIME-INR  POCT I-STAT TROPONIN I   Imaging Review Dg Chest 2 View  09/15/2013   CLINICAL DATA:  Chest pain.  Difficulty breathing.  Short of breath.  EXAM: CHEST  2 VIEW  COMPARISON:  10/20/2012  FINDINGS: Pacemaker/AICD remains in place. Heart is at the upper limits of normal in size. Calcification and unfolding of the thoracic aorta again  noted. Coronary artery stent in place. The pulmonary vascularity is normal. The lungs are clear. No effusions. No significant bony finding.  IMPRESSION: No active disease evident. Atherosclerosis. Pacemaker/ AICD. Coronary stent.   Electronically Signed   By: Paulina Fusi M.D.   On: 09/15/2013 10:57    EKG Interpretation    Date/Time:  Tuesday September 15 2013 10:24:50 EST Ventricular Rate:  57 PR Interval:  148 QRS Duration: 100 QT Interval:  452 QTC Calculation: 439 R Axis:   -19 Text Interpretation:  Sinus bradycardia Left atrial enlargement T wave abnormality, consider lateral ischemia Abnormal ECG Confirmed by Asami Lambright  MD, Jahnyla Parrillo ([id number]) on 09/15/2013 11:16:46 AM            MDM   1. Chest pain   2. Nausea     67 year old female with chest pain. The pain she describes last night is atypical for ACS with very brief and sharp pain. She has been having more typical symptoms today.Chest heaviness associated with nausea and relief with nitroglycerin is more concerning to me. It is somewhat reproducible with palpation though. EKG fairly similar to previous one from May. Trop normal. CXR clear. Denies acute  change in breathing.    Pt's imaging was reviewed. Prior medical records and nursing notes reviewed.    Raeford Razor, MD 09/15/13 1245

## 2013-09-18 ENCOUNTER — Ambulatory Visit: Payer: Medicare Other | Admitting: Physical Therapy

## 2013-09-22 ENCOUNTER — Ambulatory Visit: Payer: Medicare Other | Admitting: Physical Therapy

## 2013-09-24 ENCOUNTER — Ambulatory Visit: Payer: Medicare Other | Admitting: Physical Therapy

## 2013-09-28 ENCOUNTER — Encounter: Payer: Self-pay | Admitting: Physician Assistant

## 2013-09-28 ENCOUNTER — Ambulatory Visit (INDEPENDENT_AMBULATORY_CARE_PROVIDER_SITE_OTHER): Payer: Medicare PPO | Admitting: Physician Assistant

## 2013-09-28 VITALS — BP 130/82 | HR 64 | Ht 63.5 in | Wt 136.0 lb

## 2013-09-28 DIAGNOSIS — R0602 Shortness of breath: Secondary | ICD-10-CM

## 2013-09-28 DIAGNOSIS — I5022 Chronic systolic (congestive) heart failure: Secondary | ICD-10-CM

## 2013-09-28 DIAGNOSIS — E785 Hyperlipidemia, unspecified: Secondary | ICD-10-CM

## 2013-09-28 DIAGNOSIS — R079 Chest pain, unspecified: Secondary | ICD-10-CM

## 2013-09-28 DIAGNOSIS — I2510 Atherosclerotic heart disease of native coronary artery without angina pectoris: Secondary | ICD-10-CM

## 2013-09-28 DIAGNOSIS — I2589 Other forms of chronic ischemic heart disease: Secondary | ICD-10-CM

## 2013-09-28 DIAGNOSIS — F17200 Nicotine dependence, unspecified, uncomplicated: Secondary | ICD-10-CM

## 2013-09-28 DIAGNOSIS — Z95810 Presence of automatic (implantable) cardiac defibrillator: Secondary | ICD-10-CM

## 2013-09-28 DIAGNOSIS — I10 Essential (primary) hypertension: Secondary | ICD-10-CM

## 2013-09-28 LAB — BASIC METABOLIC PANEL
BUN: 20 mg/dL (ref 6–23)
CHLORIDE: 107 meq/L (ref 96–112)
CO2: 23 mEq/L (ref 19–32)
CREATININE: 1.2 mg/dL (ref 0.4–1.2)
Calcium: 9.7 mg/dL (ref 8.4–10.5)
GFR: 46.72 mL/min — ABNORMAL LOW (ref >60.00)
Glucose, Bld: 90 mg/dL (ref 70–99)
Potassium: 4.4 mEq/L (ref 3.5–5.1)
Sodium: 143 mEq/L (ref 135–145)

## 2013-09-28 LAB — BRAIN NATRIURETIC PEPTIDE: Pro B Natriuretic peptide (BNP): 274 pg/mL — ABNORMAL HIGH (ref 0.0–100.0)

## 2013-09-28 NOTE — Progress Notes (Signed)
[address] [place], [place]  [address] Phone: [phone] Fax:  [phone]  Date:  09/28/2013   ID:  [name], DOB [birth year], MRN [id number]  PCP:  Shirline Frees, MD  Cardiologist:  Dr. Cristopher Peru     History of Present Illness: [name] is a 68 y.o. female with a hx of CAD, status post prior stenting to the proximal RCA, status post anterior STEMI 10/2008 (RCA occluded at the prior stent, LAD treated with a BMS and a DES), ischemic cardiomyopathy, EF 13%, systolic heart failure, PAD, status post prior femoral-popliteal bypass, HTN, HL, CKD 3, GERD, restless leg syndrome and depression.  She is status post AICD.  Echo (11/2011): EF 25-30%, anteroseptal and apical akinesis, trivial AI, mild MR, PASP 34.  Last seen by Dr. Lovena Le 01/2013. She was admitted for worsening LE arterial insufficiency and ultimately underwent a R axillary to femoral bypass.  She was recently seen by Dr. Caryl Comes 09/15/2013 in the emergency room with complaints of chest pain.  Cardiac markers remained normal. Symptoms are felt to be atypical and no further workup was recommended. Notes do indicate she had asymmetric edema. I do not see that any LE venous Dopplers were performed.  She is here for follow up.  She is a poor historian.  She reports recent hx of falls and has been going to PT to help with balance.  She also tells me that she takes oxycodone.  It is not clear to me how much of this she is taking.  She tells me that she has substernal chest heaviness.  She says this is constant.  It is no worse and there is no change with activity.  There are no assoc symptoms.  It is not similar to prior angina.  She notes DOE.  She describes NYHA Class 3 symptoms.  She notes orthopnea recently.  She denies PND. No significant edema.  No syncope.  No increased abdominal girth.  She has a NP cough.  No fever.   Recent Labs: 06/28/2013: ALT 32  09/15/2013: Creatinine 1.35*; Hemoglobin 11.9*; Potassium  4.4; Pro B Natriuretic peptide (BNP) 4023.0*   CXR (09/15/2013): IMPRESSION: No active disease evident. Atherosclerosis. Pacemaker/ AICD. Coronary stent.   Wt Readings from Last 3 Encounters:  09/28/13 136 lb (61.689 kg)  07/21/13 135 lb (61.236 kg)  06/26/13 137 lb (62.143 kg)     Past Medical History  Diagnosis Date  . PVD (peripheral vascular disease)     status post prior femoral-popliteal bypass  . Back pain   . Arthritis   . CAD (coronary artery disease)     status post prior stenting to the proximal RCA, status post anterior STEMI 10/2008 (RCA occluded at the prior stent, LAD treated with a bare metal stent and a drug-eluting stent),  . Chronic systolic heart failure     echo 11/2011: EF 25-30%, AS and apical AK, trivial AI, mild MR, PASP 34.  . Anemia   . Ischemic cardiomyopathy   . AICD (automatic cardioverter/defibrillator) present   . HLD (hyperlipidemia)   . GERD (gastroesophageal reflux disease)   . RLS (restless legs syndrome)   . Depression   . Tobacco abuse   . Pain in joint, lower leg   . Depressive disorder, not elsewhere classified   . Myocardial infarction 2010    Current Outpatient Prescriptions  Medication Sig Dispense Refill  . aspirin 81 MG tablet Take 81 mg by mouth daily.        Marland Kitchen  Cholecalciferol (VITAMIN D) 1000 UNITS capsule Take 1,000 Units by mouth daily.        . citalopram (CELEXA) 40 MG tablet Take 40 mg by mouth daily.      . clonazePAM (KLONOPIN) 1 MG tablet Take 0.5-2 mg by mouth 2 (two) times daily as needed. Take 1 tablet in the morning and 2 tablets in the evening      . estrogens, conjugated, (PREMARIN) 0.625 MG tablet Take 0.625 mg by mouth daily. Take daily for 21 days then do not take for 7 days.      Marland Kitchen ezetimibe (ZETIA) 10 MG tablet Take 10 mg by mouth daily.        . ferrous sulfate 325 (65 FE) MG tablet Take 325 mg by mouth daily with breakfast.        . fish oil-omega-3 fatty acids 1000 MG capsule Take 2 g by mouth daily.       . furosemide (LASIX) 40 MG tablet Take 40-60 mg by mouth 2 (two) times daily. Take 60mg  in the morning and 40mg  in the evening      . metoprolol succinate (TOPROL-XL) 50 MG 24 hr tablet Take 1 tablet (50 mg total) by mouth daily.  90 tablet  3  . nitroGLYCERIN (NITROSTAT) 0.4 MG SL tablet Place 1 tablet (0.4 mg total) under the tongue every 5 (five) minutes as needed. For chest pain  25 tablet  2  . oxyCODONE (OXY IR/ROXICODONE) 5 MG immediate release tablet Take 1-2 tablets (5-10 mg total) by mouth every 4 (four) hours as needed for pain.  30 tablet  0  . pantoprazole (PROTONIX) 40 MG tablet Take 40 mg by mouth daily.        . potassium chloride SA (K-DUR,KLOR-CON) 20 MEQ tablet Take 20 mEq by mouth every Monday, Wednesday, and Friday.      . ramipril (ALTACE) 2.5 MG capsule Take 2.5 mg by mouth daily.      . rosuvastatin (CRESTOR) 20 MG tablet Take 1 tablet (20 mg total) by mouth daily.  30 tablet  9  . Skin Protectants, Misc. (EUCERIN) cream Apply 1 application topically 2 (two) times daily as needed for dry skin.       Marland Kitchen zolpidem (AMBIEN) 10 MG tablet Take 10 mg by mouth at bedtime as needed.        . [DISCONTINUED] mirtazapine (REMERON) 15 MG tablet Take 15 mg by mouth as needed.        . [DISCONTINUED] ranitidine (ZANTAC) 300 MG tablet Take 300 mg by mouth 2 (two) times daily.         No current facility-administered medications for this visit.    Allergies:   Naproxen and Prednisone   Social History:  The patient  reports that she has been smoking Cigarettes.  She has a 55 pack-year smoking history. She has never used smokeless tobacco. She reports that she does not drink alcohol or use illicit drugs.   Family History:  The patient's family history includes Diabetes in her mother; Heart attack in her father; Heart disease in her brother; Peripheral vascular disease in her sister.   ROS:  Please see the history of present illness.      All other systems reviewed and negative.    PHYSICAL EXAM: VS:  BP 130/82  Pulse 64  Ht 5' 3.5" (1.613 m)  Wt 136 lb (61.689 kg)  BMI 23.71 kg/m2 Well nourished, well developed, in no acute distress HEENT: normal Neck: minimally elevated  JVD Cardiac:  normal S1, S2; RRR; no murmur Lungs:  clear to auscultation bilaterally, no wheezing, rhonchi or rales Abd: soft, nontender, no hepatomegaly Ext: trace L ankle edema Skin: warm and dry Neuro:  CNs 2-12 intact, no focal abnormalities noted  EKG:  NSR, HR 73, LAD, lateral TWI, no change from prior tracings     Device Interrogation:  CoreVue demonstrates no current evidence of volume overload.    ASSESSMENT AND PLAN:  1. Chest Pain:  Symptoms are atypical.  Her chart indicates a hx of chronic CP since her MI.  She cannot remember this.  Her symptoms are not like her prior angina.  Her symptoms are somewhat suggestive of volume overload.  Her BNP was quite high in the ED.  I do not see that her Lasix was adjusted.  Her Troponin was normal during a recent visit to the ED.  I have checked her CoreVue on her device today.  Her volume status appears stable.  I will check her BNP again today.  Although her CoreVue is ok, I am still concerned she has some volume overload contributing.  I will adjust her Lasix to 80 BID x [redacted] week along with K+ 20 QD for 1 week.  She will resume previous doses of Lasix and K+ after 1 week.  Repeat BMET and BNP in 1 week.  She also notes some epigastric pain.  I will increase her Protonix to 40 BID x 2 weeks then resume 40 mg QD.  I reviewed her case with Dr. Virl Axe (DOD).  A Myoview would likely only be marginally helpful in light of her chronic RCA occlusion.  We considered proceeding with cardiac cath given her recent symptoms of chest heaviness and worsening DOE.  However, we will see if she has improvement in her symptoms with the above Rx.  I will bring her back in close follow up.  If she has no improvement in her symptoms, consider cardiac  cath. 2. CAD:  As above.  Consider cardiac cath if chest pain and dyspnea do not improve.  Continue ASA and statin. 3. Chronic Systolic CHF:  Adjust Lasix as noted.  Check BMET and BNP today and repeat in 1 week. 4. Ischemic Cardiomyopathy:  Continue beta blocker, ACEI.  Consider adding Spironolactone in the future.  5. GERD:  Adjust PPI as noted. 6. PAD:  If she ultimately needs a cardiac cath, I'm not sure if she would be able to have a radial case given her hx of R axillary to femoral BPG.  Continue f/u with [facility]. 7. Hypertension:  Controlled. 8. Hyperlipidemia:  Continue statin. 9. Tobacco Abuse:  We discussed the importance of cessation. 10. S/p AICD:  F/u with EP as planned. 11. Chronic Pain:  I cautioned her to use narcotic pain medications sparingly and only as needed as their use could lead to increasing falls. 12. Disposition:  F/u with Dr. Cristopher Peru or me in 2 weeks.   Signed, Richardson Dopp, PA-C  09/28/2013 10:33 AM

## 2013-09-28 NOTE — Patient Instructions (Signed)
LAB WORK TODAY; BMET, BNP  REPEAT LAB WORK 10/05/13; BMET, BNP   INCREASE PROTONIX 40 MG TWICE DAILY FOR 2 WEEKS THEN RESUME 1 TABLET DAILY AFTER 2 WEEKS  INCREASE LASIX 80 MG TWICE DAILY FOR 1 WEEK THE RESUME LASIX 60 MG IN THE AM AND 40 MG IN THE PM YOU WILL NEED TO INCREASE YOUR POTASSIUM TO DAILY FOR 1 WEEK THEN AFTER THE 1 WEEK GO BACK TO [facility]   YOU HAVE A FOLLOW UP WITH DR. Lovena Le 10/15/13 @ 8:45 AM

## 2013-09-29 ENCOUNTER — Ambulatory Visit: Payer: Medicare Other | Admitting: Physical Therapy

## 2013-09-29 ENCOUNTER — Telehealth: Payer: Self-pay | Admitting: *Deleted

## 2013-09-29 NOTE — Telephone Encounter (Signed)
pt notified about lab results and to only take the increase dose of lasix and K+ for 5 days instead of 7 days, repeat bmet, bnp 10/05/13. Went over instructions x 4 w/verbal understanding from pt.

## 2013-09-30 ENCOUNTER — Other Ambulatory Visit: Payer: Self-pay

## 2013-09-30 MED ORDER — FUROSEMIDE 40 MG PO TABS: ORAL_TABLET | ORAL | Status: DC

## 2013-10-01 ENCOUNTER — Ambulatory Visit: Payer: Medicare Other | Attending: Family Medicine | Admitting: Physical Therapy

## 2013-10-01 DIAGNOSIS — R269 Unspecified abnormalities of gait and mobility: Secondary | ICD-10-CM | POA: Insufficient documentation

## 2013-10-01 DIAGNOSIS — IMO0001 Reserved for inherently not codable concepts without codable children: Secondary | ICD-10-CM | POA: Insufficient documentation

## 2013-10-05 ENCOUNTER — Other Ambulatory Visit (INDEPENDENT_AMBULATORY_CARE_PROVIDER_SITE_OTHER): Payer: Medicare PPO

## 2013-10-05 DIAGNOSIS — I10 Essential (primary) hypertension: Secondary | ICD-10-CM

## 2013-10-05 DIAGNOSIS — I5022 Chronic systolic (congestive) heart failure: Secondary | ICD-10-CM

## 2013-10-05 LAB — BASIC METABOLIC PANEL
BUN: 27 mg/dL — ABNORMAL HIGH (ref 6–23)
CALCIUM: 9.9 mg/dL (ref 8.4–10.5)
CO2: 27 mEq/L (ref 19–32)
CREATININE: 1.4 mg/dL — AB (ref 0.4–1.2)
Chloride: 105 mEq/L (ref 96–112)
GFR: 40.19 mL/min — ABNORMAL LOW (ref >60.00)
Glucose, Bld: 92 mg/dL (ref 70–99)
Potassium: 4 mEq/L (ref 3.5–5.1)
Sodium: 142 mEq/L (ref 135–145)

## 2013-10-05 LAB — BRAIN NATRIURETIC PEPTIDE: Pro B Natriuretic peptide (BNP): 330 pg/mL — ABNORMAL HIGH (ref 0.0–100.0)

## 2013-10-06 ENCOUNTER — Ambulatory Visit: Payer: Medicare Other | Admitting: Family

## 2013-10-06 ENCOUNTER — Telehealth: Payer: Self-pay | Admitting: *Deleted

## 2013-10-06 ENCOUNTER — Encounter (HOSPITAL_COMMUNITY): Payer: Medicare Other

## 2013-10-06 DIAGNOSIS — I10 Essential (primary) hypertension: Secondary | ICD-10-CM

## 2013-10-06 NOTE — Telephone Encounter (Signed)
pt notified about lab results and will get lab work when she sees Dr. Lovena Le 10/15/13. Pt verbalized Plan of Care

## 2013-10-15 ENCOUNTER — Other Ambulatory Visit (INDEPENDENT_AMBULATORY_CARE_PROVIDER_SITE_OTHER): Payer: Medicare PPO

## 2013-10-15 ENCOUNTER — Encounter: Payer: Self-pay | Admitting: Internal Medicine

## 2013-10-15 ENCOUNTER — Ambulatory Visit (INDEPENDENT_AMBULATORY_CARE_PROVIDER_SITE_OTHER): Payer: Medicare PPO | Admitting: Internal Medicine

## 2013-10-15 VITALS — BP 132/84 | HR 58 | Ht 63.5 in | Wt 133.0 lb

## 2013-10-15 DIAGNOSIS — I5022 Chronic systolic (congestive) heart failure: Secondary | ICD-10-CM | POA: Diagnosis not present

## 2013-10-15 DIAGNOSIS — R0602 Shortness of breath: Secondary | ICD-10-CM

## 2013-10-15 DIAGNOSIS — I509 Heart failure, unspecified: Secondary | ICD-10-CM

## 2013-10-15 DIAGNOSIS — I428 Other cardiomyopathies: Secondary | ICD-10-CM

## 2013-10-15 DIAGNOSIS — I5021 Acute systolic (congestive) heart failure: Secondary | ICD-10-CM | POA: Diagnosis not present

## 2013-10-15 DIAGNOSIS — I10 Essential (primary) hypertension: Secondary | ICD-10-CM

## 2013-10-15 LAB — MDC_IDC_ENUM_SESS_TYPE_INCLINIC
Date Time Interrogation Session: 20150129091156
HIGH POWER IMPEDANCE MEASURED VALUE: 49.2108
Implantable Pulse Generator Serial Number: 726698
Lead Channel Impedance Value: 325 Ohm
Lead Channel Pacing Threshold Pulse Width: 0.4 ms
Lead Channel Sensing Intrinsic Amplitude: 12 mV
MDC IDC MSMT BATTERY REMAINING LONGEVITY: 76.8 mo
MDC IDC MSMT LEADCHNL RV PACING THRESHOLD AMPLITUDE: 1 V
MDC IDC SET LEADCHNL RV PACING AMPLITUDE: 2.5 V
MDC IDC SET LEADCHNL RV PACING PULSEWIDTH: 0.4 ms
MDC IDC SET LEADCHNL RV SENSING SENSITIVITY: 0.5 mV
MDC IDC SET ZONE DETECTION INTERVAL: 250 ms
MDC IDC STAT BRADY RV PERCENT PACED: 0 %
Zone Setting Detection Interval: 280 ms
Zone Setting Detection Interval: 335 ms

## 2013-10-15 LAB — BASIC METABOLIC PANEL
BUN: 34 mg/dL — ABNORMAL HIGH (ref 6–23)
CHLORIDE: 102 meq/L (ref 96–112)
CO2: 26 mEq/L (ref 19–32)
CREATININE: 1.4 mg/dL — AB (ref 0.4–1.2)
Calcium: 9.6 mg/dL (ref 8.4–10.5)
GFR: 40.52 mL/min — AB (ref >60.00)
Glucose, Bld: 95 mg/dL (ref 70–99)
Potassium: 3.8 mEq/L (ref 3.5–5.1)
Sodium: 138 mEq/L (ref 135–145)

## 2013-10-15 NOTE — Patient Instructions (Signed)
Your physician wants you to follow-up in: 12 months with Dr Knox Saliva will receive a reminder letter in the mail two months in advance. If you don't receive a letter, please call our office to schedule the follow-up appointment.     Remote monitoring is used to monitor your Pacemaker or ICD from home. This monitoring reduces the number of office visits required to check your device to one time per year. It allows [place] to keep an eye on the functioning of your device to ensure it is working properly. You are scheduled for a device check from home on 01/19/14. You may send your transmission at any time that day. If you have a wireless device, the transmission will be sent automatically. After your physician reviews your transmission, you will receive a postcard with your next transmission date.

## 2013-10-15 NOTE — Assessment & Plan Note (Signed)
Her [employer] ICD is working normally. No intercurrent ICD therapies. We'll recheck in several months.

## 2013-10-15 NOTE — Assessment & Plan Note (Signed)
Systolic heart failure is class IIb. She is fairly sedentary. She will continue her current medical therapy.

## 2013-10-15 NOTE — Progress Notes (Signed)
HPI [name] returns today for followup. She is a very pleasant 68 year old woman with chronic peripheral vascular disease, and ischemic cardiomyopathy, chronic systolic heart failure, status post ICD implantation. She is 5 years out from revascularization. She complains of bleeding which has been predominantly superficial. No syncope and no recent ICD shocks. She has claudication and recently underwent an Ax-fem bypass. She is followed by Dr. Kellie Simmering for this. No recent ICD shocks.  She has chronic dyspnea which is multifactorial. She still smokes a half pack of cigarettes daily. Allergies  Allergen Reactions  . Naproxen Other (See Comments)    Blurred vision  . Prednisone Other (See Comments)    Mean to her grandbabies     Current Outpatient Prescriptions  Medication Sig Dispense Refill  . allopurinol (ZYLOPRIM) 100 MG tablet Take 1 tablet by mouth daily.      Marland Kitchen aspirin 81 MG tablet Take 81 mg by mouth daily.        Marland Kitchen buPROPion (WELLBUTRIN XL) 300 MG 24 hr tablet Take 300 mg by mouth daily.      . Cholecalciferol (VITAMIN D) 1000 UNITS capsule Take 1,000 Units by mouth daily.        . clonazePAM (KLONOPIN) 1 MG tablet Take 1 tablet in the morning and 2 tablets in the evening      . estrogens, conjugated, (PREMARIN) 0.625 MG tablet Take 0.625 mg by mouth daily. Take daily for 21 days then do not take for 7 days.      Marland Kitchen ezetimibe (ZETIA) 10 MG tablet Take 10 mg by mouth daily.        . ferrous sulfate 325 (65 FE) MG tablet Take 325 mg by mouth daily with breakfast.        . furosemide (LASIX) 40 MG tablet Take 40 mg by mouth daily.      Marland Kitchen HYDROcodone-acetaminophen (NORCO) 10-325 MG per tablet Take 1 tablet by mouth daily.      . meclizine (ANTIVERT) 12.5 MG tablet Take 12.5 mg by mouth 3 (three) times daily as needed for dizziness.      . metaxalone (SKELAXIN) 800 MG tablet Take 1 tablet by mouth daily.      . metoprolol succinate (TOPROL-XL) 50 MG 24 hr tablet Take 1 tablet (50 mg total)  by mouth daily.  90 tablet  3  . nitroGLYCERIN (NITROSTAT) 0.4 MG SL tablet Place 1 tablet (0.4 mg total) under the tongue every 5 (five) minutes as needed. For chest pain  25 tablet  2  . ondansetron (ZOFRAN-ODT) 4 MG disintegrating tablet Take 4 mg by mouth every 8 (eight) hours as needed for nausea or vomiting.      . pantoprazole (PROTONIX) 40 MG tablet Take 40 mg by mouth daily.        . potassium chloride SA (K-DUR,KLOR-CON) 20 MEQ tablet Take 20 mEq by mouth every Monday, Wednesday, and Friday.      . ramipril (ALTACE) 2.5 MG capsule Take 2.5 mg by mouth daily.      . rosuvastatin (CRESTOR) 20 MG tablet Take 1 tablet (20 mg total) by mouth daily.  30 tablet  9  . Skin Protectants, Misc. (EUCERIN) cream Apply 1 application topically 2 (two) times daily as needed for dry skin.       Marland Kitchen zolpidem (AMBIEN) 10 MG tablet Take 10 mg by mouth at bedtime as needed.        Marland Kitchen oxyCODONE (OXY IR/ROXICODONE) 5 MG immediate release tablet Take 1-2 tablets (  5-10 mg total) by mouth every 4 (four) hours as needed for pain.  30 tablet  0  . [DISCONTINUED] mirtazapine (REMERON) 15 MG tablet Take 15 mg by mouth as needed.        . [DISCONTINUED] ranitidine (ZANTAC) 300 MG tablet Take 300 mg by mouth 2 (two) times daily.         No current facility-administered medications for this visit.     Past Medical History  Diagnosis Date  . PVD (peripheral vascular disease)     status post prior femoral-popliteal bypass  . Back pain   . Arthritis   . CAD (coronary artery disease)     status post prior stenting to the proximal RCA, status post anterior STEMI 10/2008 (RCA occluded at the prior stent, LAD treated with a bare metal stent and a drug-eluting stent),  . Chronic systolic heart failure     echo 11/2011: EF 25-30%, AS and apical AK, trivial AI, mild MR, PASP 34.  . Anemia   . Ischemic cardiomyopathy   . AICD (automatic cardioverter/defibrillator) present   . HLD (hyperlipidemia)   . GERD (gastroesophageal  reflux disease)   . RLS (restless legs syndrome)   . Depression   . Tobacco abuse   . Pain in joint, lower leg   . Depressive disorder, not elsewhere classified   . Myocardial infarction 2010    ROS:   All systems reviewed and negative except as noted in the HPI.   Past Surgical History  Procedure Laterality Date  . Psychologist, forensic    . Foot surgery    . Pr vein bypass graft,aorto-fem-pop      fem fem  04/07/09  . Abdominal hysterectomy    . Cardiac defibrillator placement    . Eye surgery  2013    Cataract bilateral   . Axillary-femoral bypass graft Right 06/29/2013    Procedure: BYPASS GRAFT AXILLA-RIGHT FEMORAL;  Surgeon: Rosetta Posner, MD;  Location: [facility] OR;  Service: Vascular;  Laterality: Right;     Family History  Problem Relation Age of Onset  . Peripheral vascular disease Sister   . Heart disease Brother   . Diabetes Mother   . Heart attack Father      History   Social History  . Marital Status: Married    Spouse Name: N/A    Number of Children: N/A  . Years of Education: N/A   Occupational History  . Not on file.   Social History Main Topics  . Smoking status: Current Every Day Smoker -- 1.00 packs/day for 55 years    Types: Cigarettes  . Smokeless tobacco: Never Used  . Alcohol Use: No  . Drug Use: No  . Sexual Activity: Not on file   Other Topics Concern  . Not on file   Social History Narrative  . No narrative on file     BP 132/84  Pulse 58  Ht 5' 3.5" (1.613 m)  Wt 133 lb (60.328 kg)  BMI 23.19 kg/m2  Physical Exam:  Chronically ill appearing 68 yo woman, who looks older than her stated age but in NAD HEENT: Unremarkable Neck:  7 cm JVD, no thyromegally Back:  No CVA tenderness Lungs:  Clear with decreased breath sounds throughout. No wheezes or rhonchi. HEART:  Regular rate rhythm, no murmurs, no rubs, no clicks Abd:  soft, positive bowel sounds, no organomegally, no rebound, no guarding Ext:  2 plus pulses, no edema, no  cyanosis, no clubbing Skin:  No  rashes no nodules, multiple ecchymotic areas Neuro:  CN II through XII intact, motor grossly intact   DEVICE  Normal device function.  See PaceArt for details.   Assess/Plan:

## 2013-10-16 ENCOUNTER — Telehealth: Payer: Self-pay | Admitting: *Deleted

## 2013-10-16 DIAGNOSIS — I10 Essential (primary) hypertension: Secondary | ICD-10-CM

## 2013-10-16 DIAGNOSIS — I5021 Acute systolic (congestive) heart failure: Secondary | ICD-10-CM

## 2013-10-16 DIAGNOSIS — I509 Heart failure, unspecified: Secondary | ICD-10-CM

## 2013-10-16 DIAGNOSIS — I5022 Chronic systolic (congestive) heart failure: Secondary | ICD-10-CM

## 2013-10-16 MED ORDER — FUROSEMIDE 20 MG PO TABS: 30.0000 mg | ORAL_TABLET | Freq: Every day | ORAL | Status: DC

## 2013-10-16 NOTE — Telephone Encounter (Signed)
pt's daughter notified about lab results and to hold lasix 1/31, re-start 10/18/13 @ lasix 30 mg in the AM, bmet 2/6. Daughter verbalized understanding to Plan of care

## 2013-10-16 NOTE — Telephone Encounter (Signed)
I asked pt about how much lasix she was taking. She states lasix 20 mg tablet; taking 1 1/2 tablet = 30 mg in the morning and 1 tablet = 20 mg in the pm. I advised I will d/w pa and let her know of his recommendations.

## 2013-10-23 ENCOUNTER — Other Ambulatory Visit (INDEPENDENT_AMBULATORY_CARE_PROVIDER_SITE_OTHER): Payer: Medicare PPO

## 2013-10-23 DIAGNOSIS — I5021 Acute systolic (congestive) heart failure: Secondary | ICD-10-CM

## 2013-10-23 DIAGNOSIS — I5022 Chronic systolic (congestive) heart failure: Secondary | ICD-10-CM

## 2013-10-23 DIAGNOSIS — I509 Heart failure, unspecified: Secondary | ICD-10-CM

## 2013-10-23 DIAGNOSIS — I10 Essential (primary) hypertension: Secondary | ICD-10-CM

## 2013-10-26 ENCOUNTER — Telehealth: Payer: Self-pay | Admitting: *Deleted

## 2013-10-26 ENCOUNTER — Encounter: Payer: Self-pay | Admitting: Vascular Surgery

## 2013-10-26 LAB — BASIC METABOLIC PANEL
BUN: 25 mg/dL — ABNORMAL HIGH (ref 6–23)
CO2: 23 mEq/L (ref 19–32)
Calcium: 9.7 mg/dL (ref 8.4–10.5)
Chloride: 111 mEq/L (ref 96–112)
Creatinine, Ser: 1.2 mg/dL (ref 0.4–1.2)
GFR: 49.51 mL/min — ABNORMAL LOW (ref >60.00)
GLUCOSE: 98 mg/dL (ref 70–99)
Potassium: 4.6 mEq/L (ref 3.5–5.1)
Sodium: 145 mEq/L (ref 135–145)

## 2013-10-26 NOTE — Telephone Encounter (Signed)
pt notified about lab results with verbal understanding  

## 2013-10-27 ENCOUNTER — Encounter: Payer: Self-pay | Admitting: Vascular Surgery

## 2013-10-27 ENCOUNTER — Ambulatory Visit (HOSPITAL_COMMUNITY)
Admission: RE | Admit: 2013-10-27 | Discharge: 2013-10-27 | Disposition: A | Discharge status: Home / Self Care | Payer: Medicare PPO | Source: Ambulatory Visit | Attending: Vascular Surgery | Admitting: Vascular Surgery

## 2013-10-27 ENCOUNTER — Ambulatory Visit (INDEPENDENT_AMBULATORY_CARE_PROVIDER_SITE_OTHER): Payer: Commercial Managed Care - HMO | Admitting: Vascular Surgery

## 2013-10-27 ENCOUNTER — Ambulatory Visit (INDEPENDENT_AMBULATORY_CARE_PROVIDER_SITE_OTHER)
Admission: RE | Admit: 2013-10-27 | Discharge: 2013-10-27 | Disposition: A | Discharge status: Home / Self Care | Payer: Medicare PPO | Source: Ambulatory Visit | Attending: Vascular Surgery | Admitting: Vascular Surgery

## 2013-10-27 VITALS — BP 139/84 | HR 53 | Resp 16 | Ht 63.5 in | Wt 140.0 lb

## 2013-10-27 DIAGNOSIS — M79609 Pain in unspecified limb: Secondary | ICD-10-CM | POA: Insufficient documentation

## 2013-10-27 DIAGNOSIS — Z48812 Encounter for surgical aftercare following surgery on the circulatory system: Secondary | ICD-10-CM

## 2013-10-27 DIAGNOSIS — I739 Peripheral vascular disease, unspecified: Secondary | ICD-10-CM

## 2013-10-27 DIAGNOSIS — Z09 Encounter for follow-up examination after completed treatment for conditions other than malignant neoplasm: Secondary | ICD-10-CM | POA: Insufficient documentation

## 2013-10-27 NOTE — Addendum Note (Signed)
Addended by: Dorthula Rue L on: 10/27/2013 04:40 PM   Modules accepted: Orders

## 2013-10-27 NOTE — Progress Notes (Signed)
Subjective:     Patient ID: [name], female   DOB: 1946-02-16, 68 y.o.   MRN: [id number]  HPI this 68 year old female returns for continued followup regarding her severe lower extremity occlusive disease. She had an urgent right axillofemoral bypass by Dr. early in October of 2014. She had a previous femoral-femoral bypass performed by me and is had bilateral femoral-popliteal bypass grafts. All grafts are currently functioning well and she denies any rest pain in either lower extremity. She does take daily aspirin. She does not walk long distances.  Past Medical History  Diagnosis Date  . PVD (peripheral vascular disease)     status post prior femoral-popliteal bypass  . Back pain   . Arthritis   . CAD (coronary artery disease)     status post prior stenting to the proximal RCA, status post anterior STEMI 10/2008 (RCA occluded at the prior stent, LAD treated with a bare metal stent and a drug-eluting stent),  . Chronic systolic heart failure     echo 11/2011: EF 25-30%, AS and apical AK, trivial AI, mild MR, PASP 34.  . Anemia   . Ischemic cardiomyopathy   . AICD (automatic cardioverter/defibrillator) present   . HLD (hyperlipidemia)   . GERD (gastroesophageal reflux disease)   . RLS (restless legs syndrome)   . Depression   . Tobacco abuse   . Pain in joint, lower leg   . Depressive disorder, not elsewhere classified   . Myocardial infarction 2010    History  Substance Use Topics  . Smoking status: Current Every Day Smoker -- 1.00 packs/day for 55 years    Types: Cigarettes  . Smokeless tobacco: Never Used  . Alcohol Use: No    Family History  Problem Relation Age of Onset  . Peripheral vascular disease Sister   . Heart disease Brother   . Diabetes Mother   . Heart attack Father     Allergies  Allergen Reactions  . Naproxen Other (See Comments)    Blurred vision  . Prednisone Other (See Comments)    Mean to her grandbabies    Current outpatient  prescriptions:allopurinol (ZYLOPRIM) 100 MG tablet, Take 1 tablet by mouth daily., Disp: , Rfl: ;  aspirin 81 MG tablet, Take 81 mg by mouth daily.  , Disp: , Rfl: ;  buPROPion (WELLBUTRIN XL) 300 MG 24 hr tablet, Take 300 mg by mouth daily., Disp: , Rfl: ;  Cholecalciferol (VITAMIN D) 1000 UNITS capsule, Take 1,000 Units by mouth daily.  , Disp: , Rfl:  clonazePAM (KLONOPIN) 1 MG tablet, Take 1 tablet in the morning and 2 tablets in the evening, Disp: , Rfl: ;  estrogens, conjugated, (PREMARIN) 0.625 MG tablet, Take 0.625 mg by mouth daily. Take daily for 21 days then do not take for 7 days., Disp: , Rfl: ;  ezetimibe (ZETIA) 10 MG tablet, Take 10 mg by mouth daily.  , Disp: , Rfl: ;  ferrous sulfate 325 (65 FE) MG tablet, Take 325 mg by mouth daily with breakfast.  , Disp: , Rfl:  furosemide (LASIX) 20 MG tablet, Take 1.5 tablets (30 mg total) by mouth daily., Disp: , Rfl: ;  meclizine (ANTIVERT) 12.5 MG tablet, Take 12.5 mg by mouth 3 (three) times daily as needed for dizziness., Disp: , Rfl: ;  metaxalone (SKELAXIN) 800 MG tablet, Take 1 tablet by mouth daily., Disp: , Rfl: ;  metoprolol succinate (TOPROL-XL) 50 MG 24 hr tablet, Take 1 tablet (50 mg total) by mouth daily., Disp:  90 tablet, Rfl: 3 nitroGLYCERIN (NITROSTAT) 0.4 MG SL tablet, Place 1 tablet (0.4 mg total) under the tongue every 5 (five) minutes as needed. For chest pain, Disp: 25 tablet, Rfl: 2;  ondansetron (ZOFRAN-ODT) 4 MG disintegrating tablet, Take 4 mg by mouth every 8 (eight) hours as needed for nausea or vomiting., Disp: , Rfl:  oxyCODONE (OXY IR/ROXICODONE) 5 MG immediate release tablet, Take 1-2 tablets (5-10 mg total) by mouth every 4 (four) hours as needed for pain., Disp: 30 tablet, Rfl: 0;  pantoprazole (PROTONIX) 40 MG tablet, Take 40 mg by mouth daily.  , Disp: , Rfl: ;  potassium chloride SA (K-DUR,KLOR-CON) 20 MEQ tablet, Take 20 mEq by mouth every Monday, Wednesday, and Friday., Disp: , Rfl:  ramipril (ALTACE) 2.5 MG  capsule, Take 2.5 mg by mouth daily., Disp: , Rfl: ;  rosuvastatin (CRESTOR) 20 MG tablet, Take 1 tablet (20 mg total) by mouth daily., Disp: 30 tablet, Rfl: 9;  Skin Protectants, Misc. (EUCERIN) cream, Apply 1 application topically 2 (two) times daily as needed for dry skin. , Disp: , Rfl: ;  zolpidem (AMBIEN) 10 MG tablet, Take 10 mg by mouth at bedtime as needed.  , Disp: , Rfl:  HYDROcodone-acetaminophen (NORCO) 10-325 MG per tablet, Take 1 tablet by mouth daily., Disp: , Rfl: ;  [DISCONTINUED] mirtazapine (REMERON) 15 MG tablet, Take 15 mg by mouth as needed.  , Disp: , Rfl: ;  [DISCONTINUED] ranitidine (ZANTAC) 300 MG tablet, Take 300 mg by mouth 2 (two) times daily.  , Disp: , Rfl:   BP 139/84  Pulse 53  Resp 16  Ht 5' 3.5" (1.613 m)  Wt 140 lb (63.504 kg)  BMI 24.41 kg/m2  Body mass index is 24.41 kg/(m^2).           Review of Systems denies chest pain,   does have occasional dyspnea on exertion and orthopnea. Complains of weakness in her left leg and dizziness. Other systems negative and a complete review of systems other than bilateral edema on occasion.   Objective:   Physical Exam BP 139/84  Pulse 53  Resp 16  Ht 5' 3.5" (1.613 m)  Wt 140 lb (63.504 kg)  BMI 24.41 kg/m2  Gen.-alert and oriented x3 in no apparent distress HEENT normal for age Lungs no rhonchi or wheezing Cardiovascular regular rhythm no murmurs carotid pulses 3+ palpable no bruits audible Abdomen soft nontender no palpable masses Musculoskeletal free of  major deformities Skin clear -no rashes Neurologic normal Lower extremities 3+ femoral pulses bilaterally. Right axillofemoral graft is 3+ pulse and femoral-femoral bypass has 3+ pulse. Both popliteals have 2+ pulses palpable. She has diffuse ecchymoses bilaterally particularly in the left pretibial region.  Today our lower surety ABIs which are reviewed and interpreted. ABIs are greater than 1.0 bilaterally with triphasic flow.         Assessment:     Widely patent right axillofemoral and right-to-left femoral-femoral bypass and bilateral femoral popliteal grafts with normal ABIs    Plan:     Return in 6 months to see nurse practitioner with ABIs in duplex scan the bypass grafts

## 2013-10-28 ENCOUNTER — Telehealth: Payer: Self-pay | Admitting: Physician Assistant

## 2013-10-28 NOTE — Telephone Encounter (Signed)
s/w pt and she asked if I could fax over her lab results to PCP Dr. Rogers Blocker with [facility]. I said no problem. Pt said thank you

## 2013-10-28 NOTE — Telephone Encounter (Signed)
New message          Pt said she needs a record showing her lab work

## 2013-11-05 ENCOUNTER — Encounter: Payer: Self-pay | Admitting: Internal Medicine

## 2013-12-25 ENCOUNTER — Telehealth: Payer: Self-pay

## 2013-12-25 NOTE — Telephone Encounter (Signed)
Phone call from pt.  Reported she continues to have problems with swelling in the left foot and leg;  Stated the swelling is improved somewhat when she gets up in the morning and then worsens throughout the day.  Stated that her ankle gets as big as a baseball.  Encouraged to elevate her legs above level of heart at intervals during the day.  Agreed, and stated she has been trying to elevate her legs.  Reported scattered bruising on left leg, and new bruise on 3rd toe of left foot.  Reported she "fell going up the stairs a few days ago."  Reports she continues to have pain in her left hip and down into her left leg; "it's been this way since my surgery."  Denied any open sores on lower extremities.  Reported pain with walking, and at rest.  Stated that the symptoms are not new, but just not improving.  Advised will move her f/u appt. in August to an earlier date.

## 2014-01-18 ENCOUNTER — Encounter: Payer: Self-pay | Admitting: Family

## 2014-01-19 ENCOUNTER — Ambulatory Visit (INDEPENDENT_AMBULATORY_CARE_PROVIDER_SITE_OTHER): Payer: Commercial Managed Care - HMO | Admitting: *Deleted

## 2014-01-19 ENCOUNTER — Ambulatory Visit (HOSPITAL_COMMUNITY)
Admission: RE | Admit: 2014-01-19 | Discharge: 2014-01-19 | Disposition: A | Discharge status: Home / Self Care | Payer: Medicare PPO | Source: Ambulatory Visit | Attending: Vascular Surgery | Admitting: Vascular Surgery

## 2014-01-19 ENCOUNTER — Encounter: Payer: Self-pay | Admitting: Internal Medicine

## 2014-01-19 ENCOUNTER — Ambulatory Visit (INDEPENDENT_AMBULATORY_CARE_PROVIDER_SITE_OTHER): Payer: Medicare PPO | Admitting: Family

## 2014-01-19 ENCOUNTER — Encounter: Payer: Self-pay | Admitting: Family

## 2014-01-19 VITALS — BP 128/89 | HR 63 | Resp 14 | Ht 63.5 in | Wt 134.5 lb

## 2014-01-19 DIAGNOSIS — Z48812 Encounter for surgical aftercare following surgery on the circulatory system: Secondary | ICD-10-CM

## 2014-01-19 DIAGNOSIS — I70219 Atherosclerosis of native arteries of extremities with intermittent claudication, unspecified extremity: Secondary | ICD-10-CM

## 2014-01-19 DIAGNOSIS — I739 Peripheral vascular disease, unspecified: Secondary | ICD-10-CM | POA: Diagnosis present

## 2014-01-19 DIAGNOSIS — R238 Other skin changes: Secondary | ICD-10-CM

## 2014-01-19 DIAGNOSIS — R209 Unspecified disturbances of skin sensation: Secondary | ICD-10-CM | POA: Insufficient documentation

## 2014-01-19 DIAGNOSIS — M79609 Pain in unspecified limb: Secondary | ICD-10-CM

## 2014-01-19 DIAGNOSIS — I509 Heart failure, unspecified: Secondary | ICD-10-CM

## 2014-01-19 DIAGNOSIS — I428 Other cardiomyopathies: Secondary | ICD-10-CM

## 2014-01-19 DIAGNOSIS — L819 Disorder of pigmentation, unspecified: Secondary | ICD-10-CM | POA: Insufficient documentation

## 2014-01-19 DIAGNOSIS — I6529 Occlusion and stenosis of unspecified carotid artery: Secondary | ICD-10-CM

## 2014-01-19 NOTE — Patient Instructions (Addendum)
Peripheral Vascular Disease Peripheral Vascular Disease (PVD), also called Peripheral Arterial Disease (PAD), is a circulation problem caused by cholesterol (atherosclerotic plaque) deposits in the arteries. PVD commonly occurs in the lower extremities (legs) but it can occur in other areas of the body, such as your arms. The cholesterol buildup in the arteries reduces blood flow which can cause pain and other serious problems. The presence of PVD can place a person at risk for Coronary Artery Disease (CAD).  CAUSES  Causes of PVD can be many. It is usually associated with more than one risk factor such as:   High Cholesterol.  Smoking.  Diabetes.  Lack of exercise or inactivity.  High blood pressure (hypertension).  Obesity.  Family history. SYMPTOMS   When the lower extremities are affected, patients with PVD may experience:  Leg pain with exertion or physical activity. This is called INTERMITTENT CLAUDICATION. This may present as cramping or numbness with physical activity. The location of the pain is associated with the level of blockage. For example, blockage at the abdominal level (distal abdominal aorta) may result in buttock or hip pain. Lower leg arterial blockage may result in calf pain.  As PVD becomes more severe, pain can develop with less physical activity.  In people with severe PVD, leg pain may occur at rest.  Other PVD signs and symptoms:  Leg numbness or weakness.  Coldness in the affected leg or foot, especially when compared to the other leg.  A change in leg color.  Patients with significant PVD are more prone to ulcers or sores on toes, feet or legs. These may take longer to heal or may reoccur. The ulcers or sores can become infected.  If signs and symptoms of PVD are ignored, gangrene may occur. This can result in the loss of toes or loss of an entire limb.  Not all leg pain is related to PVD. Other medical conditions can cause leg pain such  as:  Blood clots (embolism) or Deep Vein Thrombosis.  Inflammation of the blood vessels (vasculitis).  Spinal stenosis. DIAGNOSIS  Diagnosis of PVD can involve several different types of tests. These can include:  Pulse Volume Recording Method (PVR). This test is simple, painless and does not involve the use of X-rays. PVR involves measuring and comparing the blood pressure in the arms and legs. An ABI (Ankle-Brachial Index) is calculated. The normal ratio of blood pressures is 1. As this number becomes smaller, it indicates more severe disease.  < 0.95  indicates significant narrowing in one or more leg vessels.  <0.8 there will usually be pain in the foot, leg or buttock with exercise.  <0.4 will usually have pain in the legs at rest.  <0.25  usually indicates limb threatening PVD.  Doppler detection of pulses in the legs. This test is painless and checks to see if you have a pulses in your legs/feet.  A dye or contrast material (a substance that highlights the blood vessels so they show up on x-ray) may be given to help your caregiver better see the arteries for the following tests. The dye is eliminated from your body by the kidney's. Your caregiver may order blood work to check your kidney function and other laboratory values before the following tests are performed:  Magnetic Resonance Angiography (MRA). An MRA is a picture study of the blood vessels and arteries. The MRA machine uses a large magnet to produce images of the blood vessels.  Computed Tomography Angiography (CTA). A CTA is a   specialized x-ray that looks at how the blood flows in your blood vessels. An IV may be inserted into your arm so contrast dye can be injected.  Angiogram. Is a procedure that uses x-rays to look at your blood vessels. This procedure is minimally invasive, meaning a small incision (cut) is made in your groin. A small tube (catheter) is then inserted into the artery of your groin. The catheter is  guided to the blood vessel or artery your caregiver wants to examine. Contrast dye is injected into the catheter. X-rays are then taken of the blood vessel or artery. After the images are obtained, the catheter is taken out. TREATMENT  Treatment of PVD involves many interventions which may include:  Lifestyle changes:  Quitting smoking.  Exercise.  Following a low fat, low cholesterol diet.  Control of diabetes.  Foot care is very important to the PVD patient. Good foot care can help prevent infection.  Medication:  Cholesterol-lowering medicine.  Blood pressure medicine.  Anti-platelet drugs.  Certain medicines may reduce symptoms of Intermittent Claudication.  Interventional/Surgical options:  Angioplasty. An Angioplasty is a procedure that inflates a balloon in the blocked artery. This opens the blocked artery to improve blood flow.  Stent Implant. A wire mesh tube (stent) is placed in the artery. The stent expands and stays in place, allowing the artery to remain open.  Peripheral Bypass Surgery. This is a surgical procedure that reroutes the blood around a blocked artery to help improve blood flow. This type of procedure may be performed if Angioplasty or stent implants are not an option. SEEK IMMEDIATE MEDICAL CARE IF:   You develop pain or numbness in your arms or legs.  Your arm or leg turns cold, becomes blue in color.  You develop redness, warmth, swelling and pain in your arms or legs. MAKE SURE YOU:   Understand these instructions.  Will watch your condition.  Will get help right away if you are not doing well or get worse. Document Released: 10/11/2004 Document Revised: 11/26/2011 Document Reviewed: 09/07/2008 [employer] Patient Information 2014 [employer].   Smoking Cessation Quitting smoking is important to your health and has many advantages. However, it is not always easy to quit since nicotine is a very addictive drug. Often times, people try 3  times or more before being able to quit. This document explains the best ways for you to prepare to quit smoking. Quitting takes hard work and a lot of effort, but you can do it. ADVANTAGES OF QUITTING SMOKING  You will live longer, feel better, and live better.  Your body will feel the impact of quitting smoking almost immediately.  Within 20 minutes, blood pressure decreases. Your pulse returns to its normal level.  After 8 hours, carbon monoxide levels in the blood return to normal. Your oxygen level increases.  After 24 hours, the chance of having a heart attack starts to decrease. Your breath, hair, and body stop smelling like smoke.  After 48 hours, damaged nerve endings begin to recover. Your sense of taste and smell improve.  After 72 hours, the body is virtually free of nicotine. Your bronchial tubes relax and breathing becomes easier.  After 2 to 12 weeks, lungs can hold more air. Exercise becomes easier and circulation improves.  The risk of having a heart attack, stroke, cancer, or lung disease is greatly reduced.  After 1 year, the risk of coronary heart disease is cut in half.  After 5 years, the risk of stroke falls to   the same as a nonsmoker.  After 10 years, the risk of lung cancer is cut in half and the risk of other cancers decreases significantly.  After 15 years, the risk of coronary heart disease drops, usually to the level of a nonsmoker.  If you are pregnant, quitting smoking will improve your chances of having a healthy baby.  The people you live with, especially any children, will be healthier.  You will have extra money to spend on things other than cigarettes. QUESTIONS TO THINK ABOUT BEFORE ATTEMPTING TO QUIT You may want to talk about your answers with your caregiver.  Why do you want to quit?  If you tried to quit in the past, what helped and what did not?  What will be the most difficult situations for you after you quit? How will you plan to  handle them?  Who can help you through the tough times? Your family? Friends? A caregiver?  What pleasures do you get from smoking? What ways can you still get pleasure if you quit? Here are some questions to ask your caregiver:  How can you help me to be successful at quitting?  What medicine do you think would be best for me and how should I take it?  What should I do if I need more help?  What is smoking withdrawal like? How can I get information on withdrawal? GET READY  Set a quit date.  Change your environment by getting rid of all cigarettes, ashtrays, matches, and lighters in your home, car, or work. Do not let people smoke in your home.  Review your past attempts to quit. Think about what worked and what did not. GET SUPPORT AND ENCOURAGEMENT You have a better chance of being successful if you have help. You can get support in many ways.  Tell your family, friends, and co-workers that you are going to quit and need their support. Ask them not to smoke around you.  Get individual, group, or telephone counseling and support. Programs are available at local hospitals and health centers. Call your local health department for information about programs in your area.  Spiritual beliefs and practices may help some smokers quit.  Download a "quit meter" on your computer to keep track of quit statistics, such as how long you have gone without smoking, cigarettes not smoked, and money saved.  Get a self-help book about quitting smoking and staying off of tobacco. LEARN NEW SKILLS AND BEHAVIORS  Distract yourself from urges to smoke. Talk to someone, go for a walk, or occupy your time with a task.  Change your normal routine. Take a different route to work. Drink tea instead of coffee. Eat breakfast in a different place.  Reduce your stress. Take a hot bath, exercise, or read a book.  Plan something enjoyable to do every day. Reward yourself for not smoking.  Explore  interactive web-based programs that specialize in helping you quit. GET MEDICINE AND USE IT CORRECTLY Medicines can help you stop smoking and decrease the urge to smoke. Combining medicine with the above behavioral methods and support can greatly increase your chances of successfully quitting smoking.  Nicotine replacement therapy helps deliver nicotine to your body without the negative effects and risks of smoking. Nicotine replacement therapy includes nicotine gum, lozenges, inhalers, nasal sprays, and skin patches. Some may be available over-the-counter and others require a prescription.  Antidepressant medicine helps people abstain from smoking, but how this works is unknown. This medicine is available by prescription.    Nicotinic receptor partial agonist medicine simulates the effect of nicotine in your brain. This medicine is available by prescription. Ask your caregiver for advice about which medicines to use and how to use them based on your health history. Your caregiver will tell you what side effects to look out for if you choose to be on a medicine or therapy. Carefully read the information on the package. Do not use any other product containing nicotine while using a nicotine replacement product.  RELAPSE OR DIFFICULT SITUATIONS Most relapses occur within the first 3 months after quitting. Do not be discouraged if you start smoking again. Remember, most people try several times before finally quitting. You may have symptoms of withdrawal because your body is used to nicotine. You may crave cigarettes, be irritable, feel very hungry, cough often, get headaches, or have difficulty concentrating. The withdrawal symptoms are only temporary. They are strongest when you first quit, but they will go away within 10 14 days. To reduce the chances of relapse, try to:  Avoid drinking alcohol. Drinking lowers your chances of successfully quitting.  Reduce the amount of caffeine you consume. Once you  quit smoking, the amount of caffeine in your body increases and can give you symptoms, such as a rapid heartbeat, sweating, and anxiety.  Avoid smokers because they can make you want to smoke.  Do not let weight gain distract you. Many smokers will gain weight when they quit, usually less than 10 pounds. Eat a healthy diet and stay active. You can always lose the weight gained after you quit.  Find ways to improve your mood other than smoking. FOR MORE INFORMATION  www.smokefree.gov  Document Released: 08/28/2001 Document Revised: 03/04/2012 Document Reviewed: 12/13/2011 [employer] Patient Information 2014 [employer].   Stroke Prevention Some medical conditions and behaviors are associated with an increased chance of having a stroke. You may prevent a stroke by making healthy choices and managing medical conditions. HOW CAN I REDUCE MY RISK OF HAVING A STROKE?   Stay physically active. Get at least 30 minutes of activity on most or all days.  Do not smoke. It may also be helpful to avoid exposure to secondhand smoke.  Limit alcohol use. Moderate alcohol use is considered to be:  No more than 2 drinks per day for men.  No more than 1 drink per day for nonpregnant women.  Eat healthy foods. This involves  Eating 5 or more servings of fruits and vegetables a day.  Following a diet that addresses high blood pressure (hypertension), high cholesterol, diabetes, or obesity.  Manage your cholesterol levels.  A diet low in saturated fat, trans fat, and cholesterol and high in fiber may control cholesterol levels.  Take any prescribed medicines to control cholesterol as directed by your health care provider.  Manage your diabetes.  A controlled-carbohydrate, controlled-sugar diet is recommended to manage diabetes.  Take any prescribed medicines to control diabetes as directed by your health care provider.  Control your hypertension.  A low-salt (sodium), low-saturated fat,  low-trans fat, and low-cholesterol diet is recommended to manage hypertension.  Take any prescribed medicines to control hypertension as directed by your health care provider.  Maintain a healthy weight.  A reduced-calorie, low-sodium, low-saturated fat, low-trans fat, low-cholesterol diet is recommended to manage weight.  Stop drug abuse.  Avoid taking birth control pills.  Talk to your health care provider about the risks of taking birth control pills if you are over 35 years old, smoke, get migraines, or have   ever had a blood clot.  Get evaluated for sleep disorders (sleep apnea).  Talk to your health care provider about getting a sleep evaluation if you snore a lot or have excessive sleepiness.  Take medicines as directed by your health care provider.  For some people, aspirin or blood thinners (anticoagulants) are helpful in reducing the risk of forming abnormal blood clots that can lead to stroke. If you have the irregular heart rhythm of atrial fibrillation, you should be on a blood thinner unless there is a good reason you cannot take them.  Understand all your medicine instructions.  Make sure that other other conditions (such as anemia or atherosclerosis) are addressed. SEEK IMMEDIATE MEDICAL CARE IF:   You have sudden weakness or numbness of the face, arm, or leg, especially on one side of the body.  Your face or eyelid droops to one side.  You have sudden confusion.  You have trouble speaking (aphasia) or understanding.  You have sudden trouble seeing in one or both eyes.  You have sudden trouble walking.  You have dizziness.  You have a loss of balance or coordination.  You have a sudden, severe headache with no known cause.  You have new chest pain or an irregular heartbeat. Any of these symptoms may represent a serious problem that is an emergency. Do not wait to see if the symptoms will go away. Get medical help at once. Call your local emergency services   (911 in [place] Do not drive yourself to the hospital. Document Released: 10/11/2004 Document Revised: 06/24/2013 Document Reviewed: 03/06/2013 [employer] Patient Information 2014 [employer].  

## 2014-01-19 NOTE — Progress Notes (Signed)
[facility] HISTORY AND PHYSICAL -PAD  History of Present Illness [name] is a 68 y.o. female patient of Dr. Hart [name] who returns for continued followup regarding her severe lower extremity occlusive disease. She had an urgent right axillofemoral bypass by Dr. early in October of 2014. She had a previous femoral-femoral bypass performed by Dr. Hart [name] and has had bilateral femoral-popliteal bypass grafts. She has severe left hip to ankle pain aggravated by lying in bed, not with walking. She has known lumbar spine issues and is receiving ESI's, she states with no benefit. Pt also states that she has rare sciatic type radiating pain in her right leg. She is also getting unknown type of injections in both knees.  Pt denies non healing wounds. Pt states she had a "light stroke" about 2007, she does not remember how this manifested, denies hemiparesis, denies aphasia. Had an MI in 2010, had stents placed, also has a pacemaker and defibrillator. Has multiple purpuric areas on arms, states she bruises easily.  Pt Diabetic: No Pt smoker: smoker  (1 ppd, started at age 38 yrs)  Pt meds include: Statin :Yes ASA: Yes Other anticoagulants/antiplatelets: no  Past Medical History  Diagnosis Date  . PVD (peripheral vascular disease)     status post prior femoral-popliteal bypass  . Back pain   . Arthritis   . CAD (coronary artery disease)     status post prior stenting to the proximal RCA, status post anterior STEMI 10/2008 (RCA occluded at the prior stent, LAD treated with a bare metal stent and a drug-eluting stent),  . Chronic systolic heart failure     echo 11/2011: EF 25-30%, AS and apical AK, trivial AI, mild MR, PASP 34.  . Anemia   . Ischemic cardiomyopathy   . AICD (automatic cardioverter/defibrillator) present   . HLD (hyperlipidemia)   . GERD (gastroesophageal reflux disease)   . RLS (restless legs syndrome)   . Depression   . Tobacco abuse   . Pain  in joint, lower leg   . Depressive disorder, not elsewhere classified   . Myocardial infarction 2010  . CHF (congestive heart failure)   . COPD (chronic obstructive pulmonary disease)   . Stroke   . DVT (deep venous thrombosis)   . Chronic kidney disease   . Cancer     Skin  of Left  Face    Social History History  Substance Use Topics  . Smoking status: Current Every Day Smoker -- 1.00 packs/day for 55 years    Types: Cigarettes  . Smokeless tobacco: Never Used  . Alcohol Use: No    Family History Family History  Problem Relation Age of Onset  . Peripheral vascular disease Sister   . Heart disease Brother   . Diabetes Mother   . Varicose Veins Mother   . Heart attack Father     Past Surgical History  Procedure Laterality Date  . Visual merchandiser    . Foot surgery    . Pr vein bypass graft,aorto-fem-pop      fem fem  04/07/09  . Abdominal hysterectomy    . Cardiac defibrillator placement    . Eye surgery  2013    Cataract bilateral   . Axillary-femoral bypass graft Right 06/29/2013    Procedure: BYPASS GRAFT AXILLA-RIGHT FEMORAL;  Surgeon: Larina Earthly, MD;  Location: [facility] OR;  Service: Vascular;  Laterality: Right;    Allergies  Allergen Reactions  . Naproxen Other (See Comments)  Blurred vision  . Prednisone Other (See Comments)    Mean to her grandbabies    Current Outpatient Prescriptions  Medication Sig Dispense Refill  . allopurinol (ZYLOPRIM) 100 MG tablet Take 1 tablet by mouth daily.      Marland Kitchen aspirin 81 MG tablet Take 81 mg by mouth daily.        . Cholecalciferol (VITAMIN D) 1000 UNITS capsule Take 1,000 Units by mouth daily.        . clonazePAM (KLONOPIN) 1 MG tablet Take 1 tablet in the morning and 2 tablets in the evening      . estrogens, conjugated, (PREMARIN) 0.625 MG tablet Take 0.625 mg by mouth daily. Take daily for 21 days then do not take for 7 days.      Marland Kitchen ezetimibe (ZETIA) 10 MG tablet Take 10 mg by mouth daily.        . ferrous sulfate 325  (65 FE) MG tablet Take 325 mg by mouth daily with breakfast.        . fluticasone (FLONASE) 50 MCG/ACT nasal spray       . furosemide (LASIX) 20 MG tablet Take 1.5 tablets (30 mg total) by mouth daily.      Marland Kitchen HYDROcodone-acetaminophen (NORCO) 10-325 MG per tablet Take 1 tablet by mouth daily.      Marland Kitchen LYRICA 75 MG capsule Take 75 mg by mouth at bedtime.       . meclizine (ANTIVERT) 12.5 MG tablet Take 12.5 mg by mouth 3 (three) times daily as needed for dizziness.      . metaxalone (SKELAXIN) 800 MG tablet Take 1 tablet by mouth daily.      . metoprolol succinate (TOPROL-XL) 50 MG 24 hr tablet Take 1 tablet (50 mg total) by mouth daily.  90 tablet  3  . nitroGLYCERIN (NITROSTAT) 0.4 MG SL tablet Place 1 tablet (0.4 mg total) under the tongue every 5 (five) minutes as needed. For chest pain  25 tablet  2  . ondansetron (ZOFRAN-ODT) 4 MG disintegrating tablet Take 4 mg by mouth every 8 (eight) hours as needed for nausea or vomiting.      Marland Kitchen oxyCODONE (OXY IR/ROXICODONE) 5 MG immediate release tablet Take 1-2 tablets (5-10 mg total) by mouth every 4 (four) hours as needed for pain.  30 tablet  0  . pantoprazole (PROTONIX) 40 MG tablet Take 40 mg by mouth daily.        . potassium chloride SA (K-DUR,KLOR-CON) 20 MEQ tablet Take 20 mEq by mouth every Monday, Wednesday, and Friday.      . ramipril (ALTACE) 2.5 MG capsule Take 2.5 mg by mouth daily.      . rosuvastatin (CRESTOR) 20 MG tablet Take 1 tablet (20 mg total) by mouth daily.  30 tablet  9  . Skin Protectants, Misc. (EUCERIN) cream Apply 1 application topically 2 (two) times daily as needed for dry skin.       Marland Kitchen zolpidem (AMBIEN) 10 MG tablet Take 10 mg by mouth at bedtime as needed.        Marland Kitchen buPROPion (WELLBUTRIN XL) 300 MG 24 hr tablet Take 300 mg by mouth daily.      . [DISCONTINUED] mirtazapine (REMERON) 15 MG tablet Take 15 mg by mouth as needed.        . [DISCONTINUED] ranitidine (ZANTAC) 300 MG tablet Take 300 mg by mouth 2 (two) times  daily.         No current facility-administered medications for this  visit.    ROS: See HPI for pertinent positives and negatives.   Physical Examination  Filed Vitals:   01/19/14 1305  BP: 128/89  Pulse: 63  Resp: 14  Height: 5' 3.5" (1.613 m)  Weight: 134 lb 8 oz (61.009 kg)  SpO2: 99%  Body mass index is 23.45 kg/(m^2).  General: A&O x 3, WDW. Gait: normal Eyes: PERRLA. Pulmonary: CTAB, without wheezes , rales or rhonchi. Cardiac: regular Rythm , with murmur.         Carotid Bruits Left Right   Negative Negative  Aorta is not palpable. Radial pulses: are 1+ palpable and =.                           VASCULAR EXAM: Extremities without ischemic changes  without Gangrene; without open wounds.                                                                                                          LE Pulses LEFT RIGHT       FEMORAL  2+ palpable  2+ palpable        POPLITEAL  not palpable   not palpable       POSTERIOR TIBIAL  1+ palpable   1+ palpable        DORSALIS PEDIS      ANTERIOR TIBIAL 2+ palpable  1+ palpable    Abdomen: soft, NT, no masses. Skin: many purpuric areas on forearms and backs of hands, no ulcers noted. Musculoskeletal: no muscle wasting or atrophy.  Neurologic: A&O X 3; Appropriate Affect ; SENSATION: normal; MOTOR FUNCTION:  moving all extremities equally, motor strength 5/5 in arms, 3/5 in legs. Speech is fluent/normal. CN 2-12 intact.    Non-Invasive Vascular Imaging: DATE: 01/19/2014 LOWER EXTREMITY ARTERIAL DUPLEX EVALUATION    INDICATION: Follow up bypass grafts.    PREVIOUS INTERVENTION(S): Left superficial femoral to posterior tibial artery bypass graft on 10/07/00 Right femoral to below knee popliteal artery bypass graft on 04/23/05 Femoral to femoral bypass graft on 04/07/09 Right axillary to femoral bypass graft on 06/29/13    DUPLEX EXAM: Please noted: patient continues to have left leg pain for the past several visits, there  are no new reports of pain. Patient continues smoking.    RIGHT  LEFT   Peak Systolic Velocity (cm/s) Ratio (if abnormal) Waveform  Peak Systolic Velocity (cm/s) Ratio (if abnormal) Waveform  41  B  Inflow Artery 65  T   39  B Proximal Anastomosis 91  B  32  T  Proximal Graft 29  B  33  T Mid Graft 30  B  57  T  Distal Graft 22  B  36  B Distal Anastomosis 27  B  44  B Outflow Artery 51  B  1.06 Today's ABI / TBI 1.09  1.01 Previous ABI / TBI (10/27/2013  ) 1.05    Waveform:    M - Monophasic       B - Biphasic  T - Triphasic  If Ankle Brachial Index (ABI) or Toe Brachial Index (TBI) performed, please see complete report     ADDITIONAL FINDINGS: .Non-hemodynamically significant, homogeneous plaque formations noted in the mid to distal femoral to femoral bypass graft and near the left superficial femoral to posterior tibial artery bypass graft's proximal anastomosis. .No internal vessel narrowing noted within the right femoral to popliteal artery or distal right axillary to femoral bypass grafts. .See attached diagram for additional information. .Impression is noted on the second page of this report.       IMPRESSION: Patent femoral to femoral artery, femoral to popliteal artery and superficial femoral to posterior tibial artery bypass grafts with no hemodynamically significant stenosis noted.     Compared to the previous exam:  No significant change in comparison to the last exam on 10/27/2013.     ASSESSMENT: [name] is a 68 y.o. female who is s/p  urgent right axillofemoral bypass by Dr. early in October of 2014. She had a previous femoral-femoral bypass performed by Dr. Kellie Simmering and has had bilateral femoral-popliteal bypass grafts. She has severe left hip to ankle pain aggravated by lying in bed, not with walking. She has known lumbar spine issues and is receiving ESI's, she states with no benefit. Patent femoral to femoral artery, femoral to popliteal artery and  superficial femoral to posterior tibial artery bypass grafts with no hemodynamically significant stenosis noted.  Pt reports that she thinks she had a TIA in 2007, does not think she has had carotid US, none on file, will check on return in 6 months.   PLAN:  The patient was counseled re smoking cessation. I discussed in depth with the patient the nature of atherosclerosis, and emphasized the importance of maximal medical management including strict control of blood pressure, blood glucose, and lipid levels, obtaining regular exercise, and cessation of smoking.  The patient is aware that without maximal medical management the underlying atherosclerotic disease process will progress, limiting the benefit of any interventions.  Based on the patient's vascular studies and examination, pt will return to clinic in 6 months months for carotid US and ABI's.   The patient was given information about PAD including signs, symptoms, treatment, what symptoms should prompt the patient to seek immediate medical care, and risk reduction measures to take.  Clemon Chambers, RN, MSN, FNP-C [facility] Phone: [phone]  Clinic MD: Early  01/19/2014 1:17 PM

## 2014-01-23 LAB — MDC_IDC_ENUM_SESS_TYPE_REMOTE
Battery Remaining Longevity: 73 mo
Brady Statistic RV Percent Paced: 1 %
Date Time Interrogation Session: 20150505071526
HighPow Impedance: 48 Ohm
Lead Channel Pacing Threshold Amplitude: 1 V
Lead Channel Pacing Threshold Pulse Width: 0.4 ms
Lead Channel Sensing Intrinsic Amplitude: 12 mV
Lead Channel Setting Pacing Amplitude: 2.5 V
Lead Channel Setting Pacing Pulse Width: 0.4 ms
Lead Channel Setting Sensing Sensitivity: 0.5 mV
MDC IDC MSMT BATTERY REMAINING PERCENTAGE: 63 %
MDC IDC MSMT BATTERY VOLTAGE: 2.96 V
MDC IDC MSMT LEADCHNL RV IMPEDANCE VALUE: 330 Ohm
MDC IDC PG SERIAL: 726698
MDC IDC SET ZONE DETECTION INTERVAL: 280 ms
Zone Setting Detection Interval: 250 ms
Zone Setting Detection Interval: 335 ms

## 2014-01-28 ENCOUNTER — Encounter: Payer: Self-pay | Admitting: Cardiology

## 2014-01-29 NOTE — Progress Notes (Signed)
Remote ICD transmission.   

## 2014-02-01 ENCOUNTER — Other Ambulatory Visit: Payer: Self-pay | Admitting: Physician Assistant

## 2014-02-03 ENCOUNTER — Other Ambulatory Visit: Payer: Self-pay | Admitting: *Deleted

## 2014-02-03 DIAGNOSIS — I10 Essential (primary) hypertension: Secondary | ICD-10-CM

## 2014-02-03 DIAGNOSIS — I5022 Chronic systolic (congestive) heart failure: Secondary | ICD-10-CM

## 2014-02-03 DIAGNOSIS — I5021 Acute systolic (congestive) heart failure: Secondary | ICD-10-CM

## 2014-02-03 DIAGNOSIS — I509 Heart failure, unspecified: Secondary | ICD-10-CM

## 2014-02-03 MED ORDER — FUROSEMIDE 20 MG PO TABS: 30.0000 mg | ORAL_TABLET | Freq: Every day | ORAL | Status: DC

## 2014-02-22 ENCOUNTER — Telehealth: Payer: Self-pay | Admitting: Internal Medicine

## 2014-02-22 NOTE — Telephone Encounter (Signed)
Patient informed of DOI for her [employer] ICD was 06/06/2009.

## 2014-02-22 NOTE — Telephone Encounter (Signed)
New message     Pt want to know when did she get her pacemaker put in?

## 2014-03-21 ENCOUNTER — Emergency Department (HOSPITAL_COMMUNITY): Payer: Medicare PPO

## 2014-03-21 ENCOUNTER — Encounter (HOSPITAL_COMMUNITY): Payer: Self-pay | Admitting: Emergency Medicine

## 2014-03-21 ENCOUNTER — Emergency Department (HOSPITAL_COMMUNITY)
Admission: EM | Admit: 2014-03-21 | Discharge: 2014-03-21 | Disposition: A | Discharge status: Home / Self Care | Payer: Medicare PPO | Attending: Emergency Medicine | Admitting: Emergency Medicine

## 2014-03-21 DIAGNOSIS — Z951 Presence of aortocoronary bypass graft: Secondary | ICD-10-CM | POA: Diagnosis not present

## 2014-03-21 DIAGNOSIS — I2510 Atherosclerotic heart disease of native coronary artery without angina pectoris: Secondary | ICD-10-CM | POA: Diagnosis not present

## 2014-03-21 DIAGNOSIS — K219 Gastro-esophageal reflux disease without esophagitis: Secondary | ICD-10-CM | POA: Diagnosis not present

## 2014-03-21 DIAGNOSIS — S99919A Unspecified injury of unspecified ankle, initial encounter: Secondary | ICD-10-CM | POA: Diagnosis present

## 2014-03-21 DIAGNOSIS — Z79899 Other long term (current) drug therapy: Secondary | ICD-10-CM | POA: Insufficient documentation

## 2014-03-21 DIAGNOSIS — Z7982 Long term (current) use of aspirin: Secondary | ICD-10-CM | POA: Diagnosis not present

## 2014-03-21 DIAGNOSIS — I5022 Chronic systolic (congestive) heart failure: Secondary | ICD-10-CM | POA: Diagnosis not present

## 2014-03-21 DIAGNOSIS — S8012XA Contusion of left lower leg, initial encounter: Secondary | ICD-10-CM

## 2014-03-21 DIAGNOSIS — M129 Arthropathy, unspecified: Secondary | ICD-10-CM | POA: Insufficient documentation

## 2014-03-21 DIAGNOSIS — Y9389 Activity, other specified: Secondary | ICD-10-CM | POA: Diagnosis not present

## 2014-03-21 DIAGNOSIS — I2589 Other forms of chronic ischemic heart disease: Secondary | ICD-10-CM | POA: Insufficient documentation

## 2014-03-21 DIAGNOSIS — Z8673 Personal history of transient ischemic attack (TIA), and cerebral infarction without residual deficits: Secondary | ICD-10-CM | POA: Diagnosis not present

## 2014-03-21 DIAGNOSIS — W19XXXA Unspecified fall, initial encounter: Secondary | ICD-10-CM

## 2014-03-21 DIAGNOSIS — Y92009 Unspecified place in unspecified non-institutional (private) residence as the place of occurrence of the external cause: Secondary | ICD-10-CM | POA: Insufficient documentation

## 2014-03-21 DIAGNOSIS — Z7902 Long term (current) use of antithrombotics/antiplatelets: Secondary | ICD-10-CM | POA: Diagnosis not present

## 2014-03-21 DIAGNOSIS — N179 Acute kidney failure, unspecified: Secondary | ICD-10-CM | POA: Insufficient documentation

## 2014-03-21 DIAGNOSIS — J449 Chronic obstructive pulmonary disease, unspecified: Secondary | ICD-10-CM | POA: Diagnosis not present

## 2014-03-21 DIAGNOSIS — IMO0002 Reserved for concepts with insufficient information to code with codable children: Secondary | ICD-10-CM | POA: Diagnosis not present

## 2014-03-21 DIAGNOSIS — Z86718 Personal history of other venous thrombosis and embolism: Secondary | ICD-10-CM | POA: Diagnosis not present

## 2014-03-21 DIAGNOSIS — N189 Chronic kidney disease, unspecified: Secondary | ICD-10-CM | POA: Insufficient documentation

## 2014-03-21 DIAGNOSIS — G608 Other hereditary and idiopathic neuropathies: Secondary | ICD-10-CM | POA: Diagnosis not present

## 2014-03-21 DIAGNOSIS — S8010XA Contusion of unspecified lower leg, initial encounter: Secondary | ICD-10-CM | POA: Diagnosis not present

## 2014-03-21 DIAGNOSIS — Z85828 Personal history of other malignant neoplasm of skin: Secondary | ICD-10-CM | POA: Insufficient documentation

## 2014-03-21 DIAGNOSIS — Z95810 Presence of automatic (implantable) cardiac defibrillator: Secondary | ICD-10-CM | POA: Diagnosis not present

## 2014-03-21 DIAGNOSIS — F411 Generalized anxiety disorder: Secondary | ICD-10-CM | POA: Insufficient documentation

## 2014-03-21 DIAGNOSIS — I252 Old myocardial infarction: Secondary | ICD-10-CM | POA: Insufficient documentation

## 2014-03-21 DIAGNOSIS — D649 Anemia, unspecified: Secondary | ICD-10-CM | POA: Insufficient documentation

## 2014-03-21 DIAGNOSIS — F17200 Nicotine dependence, unspecified, uncomplicated: Secondary | ICD-10-CM | POA: Diagnosis not present

## 2014-03-21 DIAGNOSIS — R296 Repeated falls: Secondary | ICD-10-CM | POA: Diagnosis not present

## 2014-03-21 DIAGNOSIS — S81812A Laceration without foreign body, left lower leg, initial encounter: Secondary | ICD-10-CM

## 2014-03-21 DIAGNOSIS — Z23 Encounter for immunization: Secondary | ICD-10-CM | POA: Diagnosis not present

## 2014-03-21 DIAGNOSIS — E785 Hyperlipidemia, unspecified: Secondary | ICD-10-CM | POA: Diagnosis not present

## 2014-03-21 DIAGNOSIS — J4489 Other specified chronic obstructive pulmonary disease: Secondary | ICD-10-CM | POA: Insufficient documentation

## 2014-03-21 DIAGNOSIS — S8990XA Unspecified injury of unspecified lower leg, initial encounter: Secondary | ICD-10-CM | POA: Diagnosis present

## 2014-03-21 LAB — CBC WITH DIFFERENTIAL/PLATELET
BASOS ABS: 0.1 10*3/uL (ref 0.0–0.1)
Basophils Relative: 0 % (ref 0–1)
Eosinophils Absolute: 0.1 10*3/uL (ref 0.0–0.7)
Eosinophils Relative: 1 % (ref 0–5)
HCT: 36.6 % (ref 36.0–46.0)
Hemoglobin: 11.9 g/dL — ABNORMAL LOW (ref 12.0–15.0)
Lymphocytes Relative: 17 % (ref 12–46)
Lymphs Abs: 2.8 10*3/uL (ref 0.7–4.0)
MCH: 31.8 pg (ref 26.0–34.0)
MCHC: 32.5 g/dL (ref 30.0–36.0)
MCV: 97.9 fL (ref 78.0–100.0)
Monocytes Absolute: 1.2 10*3/uL — ABNORMAL HIGH (ref 0.1–1.0)
Monocytes Relative: 7 % (ref 3–12)
Neutro Abs: 12.7 10*3/uL — ABNORMAL HIGH (ref 1.7–7.7)
Neutrophils Relative %: 75 % (ref 43–77)
PLATELETS: 111 10*3/uL — AB (ref 150–400)
RBC: 3.74 MIL/uL — ABNORMAL LOW (ref 3.87–5.11)
RDW: 15.7 % — ABNORMAL HIGH (ref 11.5–15.5)
WBC: 16.8 10*3/uL — AB (ref 4.0–10.5)

## 2014-03-21 LAB — URINALYSIS, ROUTINE W REFLEX MICROSCOPIC
Bilirubin Urine: NEGATIVE
GLUCOSE, UA: NEGATIVE mg/dL
Hgb urine dipstick: NEGATIVE
KETONES UR: NEGATIVE mg/dL
Leukocytes, UA: NEGATIVE
NITRITE: NEGATIVE
PROTEIN: NEGATIVE mg/dL
Specific Gravity, Urine: 1.017 (ref 1.005–1.030)
Urobilinogen, UA: 0.2 mg/dL (ref 0.0–1.0)
pH: 5.5 (ref 5.0–8.0)

## 2014-03-21 LAB — BASIC METABOLIC PANEL
ANION GAP: 17 — AB (ref 5–15)
BUN: 34 mg/dL — ABNORMAL HIGH (ref 6–23)
CALCIUM: 9.5 mg/dL (ref 8.4–10.5)
CO2: 21 mEq/L (ref 19–32)
Chloride: 101 mEq/L (ref 96–112)
Creatinine, Ser: 1.56 mg/dL — ABNORMAL HIGH (ref 0.50–1.10)
GFR calc Af Amer: 39 mL/min — ABNORMAL LOW (ref >90)
GFR calc non Af Amer: 33 mL/min — ABNORMAL LOW (ref >90)
Glucose, Bld: 115 mg/dL — ABNORMAL HIGH (ref 70–99)
Potassium: 4 mEq/L (ref 3.7–5.3)
SODIUM: 139 meq/L (ref 137–147)

## 2014-03-21 LAB — PROTIME-INR
INR: 1.04 (ref 0.00–1.49)
Prothrombin Time: 13.6 seconds (ref 11.6–15.2)

## 2014-03-21 LAB — TYPE AND SCREEN
ABO/RH(D): B NEG
Antibody Screen: NEGATIVE

## 2014-03-21 MED ORDER — FENTANYL CITRATE 0.05 MG/ML IJ SOLN
INTRAMUSCULAR | Status: AC
  Filled 2014-03-21: qty 2

## 2014-03-21 MED ORDER — CEFAZOLIN SODIUM-DEXTROSE 2-3 GM-% IV SOLR
2.0000 g | Freq: Once | INTRAVENOUS | Status: AC
  Administered 2014-03-21: 2 g via INTRAVENOUS
  Filled 2014-03-21: qty 50

## 2014-03-21 MED ORDER — HYDROCODONE-ACETAMINOPHEN 5-325 MG PO TABS
2.0000 | ORAL_TABLET | Freq: Once | ORAL | Status: AC
  Administered 2014-03-21: 2 via ORAL
  Filled 2014-03-21: qty 2

## 2014-03-21 MED ORDER — FENTANYL CITRATE 0.05 MG/ML IJ SOLN
100.0000 ug | Freq: Once | INTRAMUSCULAR | Status: AC
  Administered 2014-03-21: 100 ug via INTRAVENOUS

## 2014-03-21 MED ORDER — SODIUM CHLORIDE 0.9 % IV BOLUS (SEPSIS)
500.0000 mL | Freq: Once | INTRAVENOUS | Status: AC
  Administered 2014-03-21: 500 mL via INTRAVENOUS

## 2014-03-21 MED ORDER — FENTANYL CITRATE 0.05 MG/ML IJ SOLN
100.0000 ug | Freq: Once | INTRAMUSCULAR | Status: AC
  Administered 2014-03-21: 100 ug via INTRAVENOUS
  Filled 2014-03-21: qty 2

## 2014-03-21 MED ORDER — HYDROCODONE-ACETAMINOPHEN 5-325 MG PO TABS: 1.0000 | ORAL_TABLET | ORAL | Status: DC | PRN

## 2014-03-21 MED ORDER — TETANUS-DIPHTH-ACELL PERTUSSIS 5-2.5-18.5 LF-MCG/0.5 IM SUSP
0.5000 mL | Freq: Once | INTRAMUSCULAR | Status: AC
  Administered 2014-03-21: 0.5 mL via INTRAMUSCULAR
  Filled 2014-03-21: qty 0.5

## 2014-03-21 MED ORDER — CEPHALEXIN 500 MG PO CAPS: 500.0000 mg | ORAL_CAPSULE | Freq: Three times a day (TID) | ORAL | Status: DC

## 2014-03-21 NOTE — ED Notes (Signed)
Pt bleeding through bandage on shin.  Will medicate per Dr Regenia Skeeter and then rewrap.

## 2014-03-21 NOTE — ED Notes (Addendum)
Patient presents to ED via  POV with injury to left lower leg. Patient states she thinks she missed a step while carrying some bags and fell.

## 2014-03-21 NOTE — ED Provider Notes (Signed)
CSN: [id number]     Arrival date & time 03/21/14  1203 History   First MD Initiated Contact with Patient 03/21/14 1229     Chief Complaint  Patient presents with  . Leg Injury     (Consider location/radiation/quality/duration/timing/severity/associated sxs/prior Treatment) HPI Comments: 68 year old female with history of anemia, lipids, smoking, CAD, CHF presents with suspected left open tibial fracture. Patient was walking behind her house going down one step causing her to fall and landed awkwardly. Patient denies blacking out however did hit the right side or head. Patient denies chest pain, shortness breath or other symptoms prior to fall. Patient recalls most the events however happened so suddenly she can't recall the exact reason my she felt likely trapped. No history of cancer or previous fracture. Pain with any range of motion. Patient is on Plavix for coronary artery disease. Husband was nearby in the house.  The history is provided by the patient and the spouse.    Past Medical History  Diagnosis Date  . PVD (peripheral vascular disease)     status post prior femoral-popliteal bypass  . Back pain   . Arthritis   . CAD (coronary artery disease)     status post prior stenting to the proximal RCA, status post anterior STEMI 10/2008 (RCA occluded at the prior stent, LAD treated with a bare metal stent and a drug-eluting stent),  . Chronic systolic heart failure     echo 11/2011: EF 25-30%, AS and apical AK, trivial AI, mild MR, PASP 34.  . Anemia   . Ischemic cardiomyopathy   . AICD (automatic cardioverter/defibrillator) present   . HLD (hyperlipidemia)   . GERD (gastroesophageal reflux disease)   . RLS (restless legs syndrome)   . Depression   . Tobacco abuse   . Pain in joint, lower leg   . Depressive disorder, not elsewhere classified   . Myocardial infarction 2010  . CHF (congestive heart failure)   . COPD (chronic obstructive pulmonary disease)   . Stroke   . DVT (deep  venous thrombosis)   . Chronic kidney disease   . Cancer     Skin  of Left  Face   Past Surgical History  Procedure Laterality Date  . Psychologist, forensic    . Foot surgery    . Pr vein bypass graft,aorto-fem-pop      fem fem  04/07/09  . Abdominal hysterectomy    . Cardiac defibrillator placement    . Eye surgery  2013    Cataract bilateral   . Axillary-femoral bypass graft Right 06/29/2013    Procedure: BYPASS GRAFT AXILLA-RIGHT FEMORAL;  Surgeon: Rosetta Posner, MD;  Location: [facility] OR;  Service: Vascular;  Laterality: Right;   Family History  Problem Relation Age of Onset  . Peripheral vascular disease Sister   . Heart disease Brother   . Diabetes Mother   . Varicose Veins Mother   . Heart attack Father    History  Substance Use Topics  . Smoking status: Current Every Day Smoker -- 1.00 packs/day for 55 years    Types: Cigarettes  . Smokeless tobacco: Never Used  . Alcohol Use: No   OB History   Grav Para Term Preterm Abortions TAB SAB Ect Mult Living                 Review of Systems  Constitutional: Negative for fever and chills.  HENT: Negative for congestion.   Eyes: Negative for visual disturbance.  Respiratory: Negative for shortness  of breath.   Cardiovascular: Negative for chest pain.  Gastrointestinal: Negative for vomiting and abdominal pain.  Genitourinary: Negative for dysuria and flank pain.  Musculoskeletal: Negative for back pain, neck pain and neck stiffness.  Skin: Positive for wound. Negative for rash.  Neurological: Negative for light-headedness and headaches.  Psychiatric/Behavioral: The patient is nervous/anxious.       Allergies  Naproxen and Prednisone  Home Medications   Prior to Admission medications   Medication Sig Start Date End Date Taking? Authorizing Provider  allopurinol (ZYLOPRIM) 100 MG tablet Take 1 tablet by mouth daily. 09/28/13   Historical Provider, MD  aspirin 81 MG tablet Take 81 mg by mouth daily.      Historical Provider,  MD  buPROPion (WELLBUTRIN XL) 300 MG 24 hr tablet Take 300 mg by mouth daily.    Historical Provider, MD  Cholecalciferol (VITAMIN D) 1000 UNITS capsule Take 1,000 Units by mouth daily.      Historical Provider, MD  clonazePAM (KLONOPIN) 1 MG tablet Take 1 tablet in the morning and 2 tablets in the evening    Historical Provider, MD  estrogens, conjugated, (PREMARIN) 0.625 MG tablet Take 0.625 mg by mouth daily. Take daily for 21 days then do not take for 7 days.    Historical Provider, MD  ezetimibe (ZETIA) 10 MG tablet Take 10 mg by mouth daily.      Historical Provider, MD  ferrous sulfate 325 (65 FE) MG tablet Take 325 mg by mouth daily with breakfast.      Historical Provider, MD  fluticasone (FLONASE) 50 MCG/ACT nasal spray  01/06/14   Historical Provider, MD  furosemide (LASIX) 20 MG tablet Take 1.5 tablets (30 mg total) by mouth daily. 02/03/14   Evans Lance, MD  HYDROcodone-acetaminophen (NORCO) 10-325 MG per tablet Take 1 tablet by mouth daily. 09/21/13   Historical Provider, MD  LYRICA 75 MG capsule Take 75 mg by mouth at bedtime.  12/17/13   Historical Provider, MD  meclizine (ANTIVERT) 12.5 MG tablet Take 12.5 mg by mouth 3 (three) times daily as needed for dizziness.    Historical Provider, MD  metaxalone (SKELAXIN) 800 MG tablet Take 1 tablet by mouth daily. 09/21/13   Historical Provider, MD  metoprolol succinate (TOPROL-XL) 50 MG 24 hr tablet Take 1 tablet (50 mg total) by mouth daily. 02/22/12   Evans Lance, MD  nitroGLYCERIN (NITROSTAT) 0.4 MG SL tablet Place 1 tablet (0.4 mg total) under the tongue every 5 (five) minutes as needed. For chest pain 12/07/11   Liliane Shi, PA-C  ondansetron (ZOFRAN-ODT) 4 MG disintegrating tablet Take 4 mg by mouth every 8 (eight) hours as needed for nausea or vomiting.    Historical Provider, MD  oxyCODONE (OXY IR/ROXICODONE) 5 MG immediate release tablet Take 1-2 tablets (5-10 mg total) by mouth every 4 (four) hours as needed for pain. 07/01/13    Sharmon Leyden Nickel, NP  pantoprazole (PROTONIX) 40 MG tablet Take 40 mg by mouth daily.      Historical Provider, MD  potassium chloride SA (K-DUR,KLOR-CON) 20 MEQ tablet Take 20 mEq by mouth every Monday, Wednesday, and Friday.    Historical Provider, MD  ramipril (ALTACE) 2.5 MG capsule Take 2.5 mg by mouth daily.    Historical Provider, MD  rosuvastatin (CRESTOR) 20 MG tablet Take 1 tablet (20 mg total) by mouth daily. 08/14/11   Evans Lance, MD  Skin Protectants, Misc. (EUCERIN) cream Apply 1 application topically 2 (two) times daily  as needed for dry skin.     Historical Provider, MD  zolpidem (AMBIEN) 10 MG tablet Take 10 mg by mouth at bedtime as needed.      Historical Provider, MD   BP 110/70  Pulse 73  Temp(Src) 98.2 F (36.8 C) (Oral)  Resp 14  Wt 134 lb (60.782 kg)  SpO2 99% Physical Exam  Nursing note and vitals reviewed. Constitutional: She is oriented to person, place, and time. She appears well-developed and well-nourished.  HENT:  Head: Normocephalic and atraumatic.  Eyes: Conjunctivae are normal. Right eye exhibits no discharge. Left eye exhibits no discharge.  Neck: Normal range of motion. Neck supple. No tracheal deviation present.  Cardiovascular: Normal rate and regular rhythm.   Pulmonary/Chest: Effort normal and breath sounds normal.  Abdominal: Soft. She exhibits no distension. There is no tenderness. There is no guarding.  Musculoskeletal: She exhibits edema and tenderness.  Patient has mild tenderness right anterior knee with full range of motion of hip knee and ankle and the right without discomfort. Multiple patches of ecchymosis on lower exam is bilateral.  Neurological: She is alert and oriented to person, place, and time. No cranial nerve deficit.  Patient denies midline cervical tenderness, full range of motion head and neck.  Skin: Skin is warm. No rash noted. Laceration: difficult exam of the left hip and knee due to pain.  Patient has superficial  abrasions and large superficial skin tear laceration left lateral proximal tibia and left lower anterior tibia, tender to palpation, bleeding mild and controlled, 2+ distal pulse and s left lower extremity.  Psychiatric: She has a normal mood and affect.    ED Course  Procedures (including critical care time) Labs Review Labs Reviewed  BASIC METABOLIC PANEL - Abnormal; Notable for the following:    Glucose, Bld 115 (*)    BUN 34 (*)    Creatinine, Ser 1.56 (*)    GFR calc non Af Amer 33 (*)    GFR calc Af Amer 39 (*)    Anion gap 17 (*)    All other components within normal limits  CBC WITH DIFFERENTIAL - Abnormal; Notable for the following:    WBC 16.8 (*)    RBC 3.74 (*)    Hemoglobin 11.9 (*)    RDW 15.7 (*)    Platelets 111 (*)    Neutro Abs 12.7 (*)    Monocytes Absolute 1.2 (*)    All other components within normal limits  PROTIME-INR  URINALYSIS, ROUTINE W REFLEX MICROSCOPIC  TROPONIN I  TYPE AND SCREEN    Imaging Review Ct Head Wo Contrast  03/21/2014   CLINICAL DATA:  Status post fall  EXAM: CT HEAD WITHOUT CONTRAST  TECHNIQUE: Contiguous axial images were obtained from the base of the skull through the vertex without intravenous contrast.  COMPARISON:  Noncontrast CT scan of the brain of November 22, 2011  FINDINGS: There is mild diffuse cerebral and cerebellar atrophy with mild compensatory ventriculomegaly. These findings are stable. Old small lacunar infarctions in the basal ganglia are again demonstrated. There is decreased density in the deep white matter of both cerebral hemispheres consistent with chronic small vessel ischemic type change. An area of stable encephalomalacia in the left posterior parietal lobe is again demonstrated.  The paranasal sinuses and mastoid air cells are clear. There is no acute skull fracture.  IMPRESSION: There is no acute intracranial hemorrhage nor other acute intracranial abnormality. Significant chronic change is present consistent with  small vessel ischemia.  Electronically Signed   By: David  Martinique   On: 03/21/2014 13:49   Ct Cervical Spine Wo Contrast  03/21/2014   CLINICAL DATA:  Status post fall  EXAM: CT HEAD WITHOUT CONTRAST  TECHNIQUE: Contiguous axial images were obtained from the base of the skull through the vertex without intravenous contrast.  COMPARISON:  Noncontrast CT scan of the brain of November 22, 2011  FINDINGS: There is mild diffuse cerebral and cerebellar atrophy with mild compensatory ventriculomegaly. These findings are stable. Old small lacunar infarctions in the basal ganglia are again demonstrated. There is decreased density in the deep white matter of both cerebral hemispheres consistent with chronic small vessel ischemic type change. An area of stable encephalomalacia in the left posterior parietal lobe is again demonstrated.  The paranasal sinuses and mastoid air cells are clear. There is no acute skull fracture.  IMPRESSION: There is no acute intracranial hemorrhage nor other acute intracranial abnormality. Significant chronic change is present consistent with small vessel ischemia.   Electronically Signed   By: David  Martinique   On: 03/21/2014 13:49   Dg Pelvis Portable  03/21/2014   CLINICAL DATA:  Pelvic pain following trauma  EXAM: PORTABLE PELVIS 1-2 VIEWS  COMPARISON:  None.  FINDINGS: Left iliac stenting is identified. Postsurgical changes are noted in the inguinal regions bilaterally. The pelvic ring is intact. No acute bony abnormality is seen.  IMPRESSION: No acute abnormality noted.   Electronically Signed   By: Inez Catalina M.D.   On: 03/21/2014 13:42   Dg Femur Left Port  03/21/2014   CLINICAL DATA:  68 year old who fell and sustained an injury to the left lower extremity. Lacerations at the level of the knee and the distal lower leg. Left anterior knee pain.  EXAM: PORTABLE LEFT FEMUR - 2 VIEW  COMPARISON:  None.  FINDINGS: Cross-table lateral image not optimal, as the hip joint is not visualized in  the lateral projection. No acute fracture involving the femur. Well preserved bone mineral density. Visualized hip joint intact. Mild medial and patellofemoral compartment joint space narrowing at the knee.  IMPRESSION: No acute osseous abnormality.   Electronically Signed   By: Evangeline Dakin M.D.   On: 03/21/2014 13:38   Dg Tibia/fibula Left Port  03/21/2014   CLINICAL DATA:  68 year old who fell sustaining injury to the left lower extremity. Lacerations at the knee and the distal lower leg. Left anterior knee pain.  EXAM: PORTABLE LEFT TIBIA AND FIBULA - 2 VIEW  COMPARISON:  None.  FINDINGS: Soft tissue laceration overlying the distal tibial diaphysis. No acute fracture involving the tibia or fibula. Bone mineral density well-preserved. Bone island in the proximal tibial metaphysis. Mild medial and patellofemoral compartment joint space narrowing at the knee. Visualized ankle joint intact.  IMPRESSION: No acute osseous abnormality.   Electronically Signed   By: Evangeline Dakin M.D.   On: 03/21/2014 13:45     EKG Interpretation   Date/Time:  Sunday March 21 2014 12:12:58 EDT Ventricular Rate:  71 PR Interval:  161 QRS Duration: 109 QT Interval:  416 QTC Calculation: 452 R Axis:   45 Text Interpretation:  Sinus rhythm Left atrial enlargement Left  ventricular hypertrophy Anterior infarct, old Abnormal T, consider  ischemia, diffuse leads Confirmed by Denina Rieger  MD, Jontay Maston ([id number]) on 03/21/2014  12:37:55 PM      MDM   Final diagnoses:  Acute renal failure, unspecified acute renal failure type  Multiple leg contusions, left, initial encounter  Skin tear of  left lower leg without complication, initial encounter  Fall, initial encounter   Likely mechanical fall resulting in likely open tibial fracture versus bone contusion. Pain medicines and small IV fluid bolus given. X-rays pending.  Patient pain improved after narcotics. Wound care with wounds irrigated and cleaned.  Patient received  antibiotics and tetanus in ER. On recheck patient clinically improved and feels pain is controlled. X-rays reviewed no acute fracture. CT head and neck unremarkable. Discussed observation in the hospital  With pt due to significant pain and presentation however patient wishes to go home, her significant other is with her in the room. She has capacity to make decisions.  Non-adherent sticky pads and bandages placed.  Pt tolerating po on recheck, comfortable.  Oral fluids and plan for ambulation on room. Plan to page [employer] to assess defibrillator prior to discharge in the case that this was the cause of her fall.  Patient persists in that she would like to be discharged after evaluation thinners.  Filed Vitals:   03/21/14 1532  BP: 118/71  Pulse: 65  Temp:   Resp: 15        Mariea Clonts, MD 03/21/14 1606

## 2014-03-21 NOTE — ED Notes (Signed)
MD at bedside. 

## 2014-03-21 NOTE — ED Notes (Signed)
No bleeding noted at this time from left lower leg. Patient states, " I am ready to go home now." +PMS

## 2014-03-21 NOTE — ED Provider Notes (Signed)
Patient's leg wound started rebleeding after she was discharged by Dr. Reather Converse. Initial pressure helped but I would pressure was relieved it started oozing again. This consistent with a severe avulsion but no obvious laceration to repair. Quick clot and a compressive bandage was applied. 2 May Norvasc intact distal to the injury and bandaged. She's stable for discharge as her pain and bleeding is controlled.  Ephraim Hamburger, MD 03/22/14 [phone]

## 2014-03-21 NOTE — Discharge Instructions (Signed)
If you were given medicines take as directed.  If you are on coumadin or contraceptives realize their levels and effectiveness is altered by many different medicines.  If you have any reaction (rash, tongues swelling, other) to the medicines stop taking and see a physician.  Keep wounds clean. Take antibiotics. Please follow up as directed and return to the ER or see a physician for new or worsening symptoms.  Thank you. Filed Vitals:   03/21/14 1415 03/21/14 1430 03/21/14 1515 03/21/14 1532  BP: 121/59 123/74 125/54 118/71  Pulse: 53 54 61 65  Temp:      TempSrc:      Resp: 15 18 17 15   Height:      Weight:      SpO2: 93% 98% 99% 100%

## 2014-03-21 NOTE — ED Notes (Signed)
Verbal order from Dr. Vanita Panda to reassess patient's leg for bleeding in approx 30 minutes.

## 2014-03-21 NOTE — Progress Notes (Signed)
Chaplain responded to level two trauma. Pt has good support from family. No further needs at this time.

## 2014-03-21 NOTE — ED Notes (Signed)
Re-wrapped with additional quick clot by Dr Vanita Panda.

## 2014-03-21 NOTE — ED Notes (Signed)
Interrogated [employer] pacemaker/defib

## 2014-03-21 NOTE — ED Notes (Addendum)
Pt ambulated well with single staff assist to the bathroom.  Family took pts robe, cut pants, and wallet/cigarette case.

## 2014-03-21 NOTE — ED Notes (Signed)
Pt to CT now

## 2014-04-16 ENCOUNTER — Other Ambulatory Visit: Payer: Self-pay | Admitting: Internal Medicine

## 2014-04-22 ENCOUNTER — Ambulatory Visit (INDEPENDENT_AMBULATORY_CARE_PROVIDER_SITE_OTHER): Payer: Commercial Managed Care - HMO | Admitting: *Deleted

## 2014-04-22 ENCOUNTER — Encounter: Payer: Self-pay | Admitting: Internal Medicine

## 2014-04-22 DIAGNOSIS — I509 Heart failure, unspecified: Secondary | ICD-10-CM

## 2014-04-22 DIAGNOSIS — I428 Other cardiomyopathies: Secondary | ICD-10-CM

## 2014-04-22 LAB — MDC_IDC_ENUM_SESS_TYPE_REMOTE
Battery Voltage: 2.96 V
Date Time Interrogation Session: 20150806080013
HighPow Impedance: 54 Ohm
Lead Channel Pacing Threshold Amplitude: 1 V
Lead Channel Sensing Intrinsic Amplitude: 12 mV
Lead Channel Setting Pacing Pulse Width: 0.4 ms
MDC IDC MSMT BATTERY REMAINING LONGEVITY: 71 mo
MDC IDC MSMT BATTERY REMAINING PERCENTAGE: 61 %
MDC IDC MSMT LEADCHNL RV IMPEDANCE VALUE: 330 Ohm
MDC IDC MSMT LEADCHNL RV PACING THRESHOLD PULSEWIDTH: 0.4 ms
MDC IDC PG SERIAL: 726698
MDC IDC SET LEADCHNL RV PACING AMPLITUDE: 2.5 V
MDC IDC SET LEADCHNL RV SENSING SENSITIVITY: 0.5 mV
MDC IDC SET ZONE DETECTION INTERVAL: 280 ms
MDC IDC SET ZONE DETECTION INTERVAL: 335 ms
MDC IDC STAT BRADY RV PERCENT PACED: 1 %
Zone Setting Detection Interval: 250 ms

## 2014-04-22 NOTE — Progress Notes (Signed)
Remote ICD transmission.   

## 2014-04-27 ENCOUNTER — Other Ambulatory Visit (HOSPITAL_COMMUNITY): Payer: Medicare PPO

## 2014-04-27 ENCOUNTER — Ambulatory Visit: Payer: Medicare PPO | Admitting: Family

## 2014-04-27 ENCOUNTER — Encounter (HOSPITAL_COMMUNITY): Payer: Medicare PPO

## 2014-04-30 ENCOUNTER — Encounter: Payer: Self-pay | Admitting: Cardiology

## 2014-05-09 ENCOUNTER — Other Ambulatory Visit: Payer: Self-pay | Admitting: Internal Medicine

## 2014-05-11 ENCOUNTER — Other Ambulatory Visit: Payer: Self-pay | Admitting: Family Medicine

## 2014-05-11 ENCOUNTER — Ambulatory Visit
Admission: RE | Admit: 2014-05-11 | Discharge: 2014-05-11 | Disposition: A | Discharge status: Home / Self Care | Payer: Commercial Managed Care - HMO | Source: Ambulatory Visit | Attending: Family Medicine | Admitting: Family Medicine

## 2014-05-11 DIAGNOSIS — M79672 Pain in left foot: Secondary | ICD-10-CM

## 2014-05-26 ENCOUNTER — Other Ambulatory Visit: Payer: Self-pay | Admitting: Internal Medicine

## 2014-05-28 ENCOUNTER — Ambulatory Visit (INDEPENDENT_AMBULATORY_CARE_PROVIDER_SITE_OTHER): Payer: Commercial Managed Care - HMO | Admitting: Internal Medicine

## 2014-05-28 ENCOUNTER — Encounter: Payer: Self-pay | Admitting: Internal Medicine

## 2014-05-28 VITALS — BP 140/86 | HR 58 | Ht 63.0 in | Wt 129.6 lb

## 2014-05-28 DIAGNOSIS — I5022 Chronic systolic (congestive) heart failure: Secondary | ICD-10-CM

## 2014-05-28 DIAGNOSIS — I509 Heart failure, unspecified: Secondary | ICD-10-CM | POA: Diagnosis not present

## 2014-05-28 DIAGNOSIS — I2589 Other forms of chronic ischemic heart disease: Secondary | ICD-10-CM

## 2014-05-28 DIAGNOSIS — Z95810 Presence of automatic (implantable) cardiac defibrillator: Secondary | ICD-10-CM | POA: Diagnosis not present

## 2014-05-28 LAB — MDC_IDC_ENUM_SESS_TYPE_INCLINIC
Brady Statistic RV Percent Paced: 0 %
Date Time Interrogation Session: 20150911151157
HighPow Impedance: 45.0448
Lead Channel Pacing Threshold Amplitude: 1 V
Lead Channel Pacing Threshold Amplitude: 1 V
Lead Channel Pacing Threshold Pulse Width: 0.4 ms
Lead Channel Setting Pacing Amplitude: 2.5 V
MDC IDC MSMT BATTERY REMAINING LONGEVITY: 72 mo
MDC IDC MSMT LEADCHNL RV IMPEDANCE VALUE: 312.5 Ohm
MDC IDC MSMT LEADCHNL RV PACING THRESHOLD PULSEWIDTH: 0.4 ms
MDC IDC MSMT LEADCHNL RV SENSING INTR AMPL: 12 mV
MDC IDC PG SERIAL: 726698
MDC IDC SET LEADCHNL RV PACING PULSEWIDTH: 0.4 ms
MDC IDC SET LEADCHNL RV SENSING SENSITIVITY: 0.5 mV
MDC IDC SET ZONE DETECTION INTERVAL: 335 ms
Zone Setting Detection Interval: 250 ms
Zone Setting Detection Interval: 280 ms

## 2014-05-28 NOTE — Progress Notes (Signed)
HPI [name] returns today for followup. She is a very pleasant 68 year old woman with chronic peripheral vascular disease and ongoing tobacco abuse, and ischemic cardiomyopathy, chronic systolic heart failure, status post ICD implantation. She is 5 years out from revascularization. She complains of bleeding which has been predominantly superficial in her lower extremities where he skin is thin. No syncope and no recent ICD shocks. She has claudication and recently underwent an Ax-fem bypass. She is followed by Dr. Kellie [name] for this. No recent ICD shocks.  She has chronic dyspnea which is multifactorial.  Allergies  Allergen Reactions  . Morphine And Related Other (See Comments)    hallucinations  . Gabapentin Other (See Comments)    300 mg upsets her stomach and 100 mg affect her sleep  . Naproxen Other (See Comments)    Blurred vision  . Prednisone Other (See Comments)    cranky     Current Outpatient Prescriptions  Medication Sig Dispense Refill  . allopurinol (ZYLOPRIM) 100 MG tablet Take 100 mg by mouth daily.       Marland Kitchen aspirin EC 81 MG tablet Take 81 mg by mouth daily.      . Cholecalciferol (VITAMIN D) 1000 UNITS capsule Take 1,000 Units by mouth daily.        . clonazePAM (KLONOPIN) 1 MG tablet Take 2 mg by mouth at bedtime.       Marland Kitchen estrogens, conjugated, (PREMARIN) 1.25 MG tablet Take 1.25 mg by mouth daily.      . ferrous sulfate 325 (65 FE) MG tablet Take 325 mg by mouth daily with breakfast.        . fluticasone (FLONASE) 50 MCG/ACT nasal spray Place 2 sprays into both nostrils daily.      . furosemide (LASIX) 20 MG tablet Take 1.5 tablets (30 mg total) by mouth daily.  45 tablet  5  . HYDROcodone-acetaminophen (NORCO) 10-325 MG per tablet Take 1-2 tablets by mouth 4 (four) times daily as needed (pain).       Marland Kitchen KLOR-CON M20 20 MEQ tablet TAKE 1 TABLET BY MOUTH ON MONDAYS,WEDNESDAYS, AND FRIDAYS  90 tablet  1  . metoprolol succinate (TOPROL-XL) 50 MG 24 hr tablet Take 1 tablet  (50 mg total) by mouth daily.  90 tablet  3  . nitroGLYCERIN (NITROSTAT) 0.4 MG SL tablet Place 0.4 mg under the tongue every 5 (five) minutes as needed for chest pain.      . pantoprazole (PROTONIX) 40 MG tablet Take 40 mg by mouth daily.        Marland Kitchen PRESCRIPTION MEDICATION Apply 1 application topically daily as needed (itching). Compounded cream:  Ketoconazole 2% cream/ fluticasone 0.05% cream 1:1      . ramipril (ALTACE) 2.5 MG capsule TAKE ONE CAPSULE BY MOUTH DAILY  90 capsule  1  . zolpidem (AMBIEN) 10 MG tablet Take 10 mg by mouth at bedtime.       . [DISCONTINUED] mirtazapine (REMERON) 15 MG tablet Take 15 mg by mouth as needed.        . [DISCONTINUED] ranitidine (ZANTAC) 300 MG tablet Take 300 mg by mouth 2 (two) times daily.         No current facility-administered medications for this visit.     Past Medical History  Diagnosis Date  . PVD (peripheral vascular disease)     status post prior femoral-popliteal bypass  . Back pain   . Arthritis   . CAD (coronary artery disease)     status post prior  stenting to the proximal RCA, status post anterior STEMI 10/2008 (RCA occluded at the prior stent, LAD treated with a bare metal stent and a drug-eluting stent),  . Chronic systolic heart failure     echo 11/2011: EF 25-30%, AS and apical AK, trivial AI, mild MR, PASP 34.  . Anemia   . Ischemic cardiomyopathy   . AICD (automatic cardioverter/defibrillator) present   . HLD (hyperlipidemia)   . GERD (gastroesophageal reflux disease)   . RLS (restless legs syndrome)   . Depression   . Tobacco abuse   . Pain in joint, lower leg   . Depressive disorder, not elsewhere classified   . Acute myocardial infarction of other anterior wall, subsequent episode of care 2010  . CHF (congestive heart failure)   . COPD (chronic obstructive pulmonary disease)   . Stroke   . DVT (deep venous thrombosis)   . Chronic kidney disease   . Cancer     Skin  of Left  Face  . Mixed hyperlipidemia   . Gout    . Atherosclerosis of native arteries of the extremities with intermittent claudication   . Osteoarthritis   . Eczema     ROS:   All systems reviewed and negative except as noted in the HPI.   Past Surgical History  Procedure Laterality Date  . Psychologist, forensic    . Foot surgery    . Pr vein bypass graft,aorto-fem-pop      fem fem  04/07/09  . Abdominal hysterectomy    . Cardiac defibrillator placement    . Eye surgery  2013    Cataract bilateral   . Axillary-femoral bypass graft Right 06/29/2013    Procedure: BYPASS GRAFT AXILLA-RIGHT FEMORAL;  Surgeon: Anne Posner, MD;  Location: [facility];  Service: Vascular;  Laterality: Right;  . Femoral-tibial bypass graft  2002  . Femoral-femoral bypass graft  03/2009    S/P left to right fem-fem bypass graft     Family History  Problem Relation Age of Onset  . Peripheral vascular disease Sister   . Heart disease Brother   . CAD Brother     With stent  . Diabetes Mother   . Varicose Veins Mother   . Peripheral vascular disease Mother   . Hypertension Mother   . Heart attack Father   . CAD Father      History   Social History  . Marital Status: Married    Spouse Name: N/A    Number of Children: N/A  . Years of Education: N/A   Occupational History  . Not on file.   Social History Main Topics  . Smoking status: Current Every Day Smoker -- 1.00 packs/day for 55 years    Types: Cigarettes  . Smokeless tobacco: Never Used  . Alcohol Use: No  . Drug Use: No  . Sexual Activity: Not on file   Other Topics Concern  . Not on file   Social History Narrative  . No narrative on file     BP 140/86  Pulse 58  Ht 5\' 3"  (1.6 m)  Wt 129 lb 9.6 oz (58.786 kg)  BMI 22.96 kg/m2  Physical Exam:  Chronically ill appearing 68 yo woman, who looks older than her stated age but in NAD HEENT: Unremarkable Neck:  7 cm JVD, no thyromegally Back:  No CVA tenderness Lungs:  Clear with decreased breath sounds throughout. No wheezes or  rhonchi. HEART:  Regular rate rhythm, no murmurs, no rubs, no clicks Abd:  soft, positive bowel sounds, no organomegally, no rebound, no guarding Ext:  2 plus pulses, no edema, no cyanosis, no clubbing Skin:  No rashes no nodules, multiple ecchymotic areas Neuro:  CN II through XII intact, motor grossly intact   DEVICE  Normal device function.  See PaceArt for details.   Assess/Plan:

## 2014-05-28 NOTE — Assessment & Plan Note (Signed)
Her [employer] ICD is working normally. Will follow.

## 2014-05-28 NOTE — Assessment & Plan Note (Signed)
Her symptoms are class 2. She is actually limited in her activity by claudication. No change in meds. She is instructed to maintain a low sodium diet.

## 2014-05-28 NOTE — Patient Instructions (Addendum)
Remote monitoring is used to monitor your  ICD from home. This monitoring reduces the number of office visits required to check your device to one time per year. It allows [place] to keep an eye on the functioning of your device to ensure it is working properly. You are scheduled for a device check from home on 08-30-2014. You may send your transmission at any time that day. If you have a wireless device, the transmission will be sent automatically. After your physician reviews your transmission, you will receive a postcard with your next transmission date.  Your physician recommends that you schedule a follow-up appointment in: 12 months with Dr.Taylor  Your physician has recommended you make the following change in your medication:  1) INCREASE LASIX TO 2 TABLETS DAILY FOR 3 DAYS 2) AFTER THREE DAYS, RESUME 1.5 TABS OF LASIX DAILY

## 2014-06-03 ENCOUNTER — Encounter: Payer: Self-pay | Admitting: Internal Medicine

## 2014-06-11 ENCOUNTER — Other Ambulatory Visit: Payer: Self-pay | Admitting: *Deleted

## 2014-06-11 DIAGNOSIS — G609 Hereditary and idiopathic neuropathy, unspecified: Secondary | ICD-10-CM

## 2014-07-03 ENCOUNTER — Emergency Department (HOSPITAL_COMMUNITY)
Admission: EM | Admit: 2014-07-03 | Discharge: 2014-07-03 | Disposition: A | Discharge status: Home / Self Care | Payer: Medicare PPO | Attending: Emergency Medicine | Admitting: Emergency Medicine

## 2014-07-03 ENCOUNTER — Emergency Department (HOSPITAL_COMMUNITY): Payer: Medicare PPO

## 2014-07-03 ENCOUNTER — Encounter (HOSPITAL_COMMUNITY): Payer: Self-pay | Admitting: Emergency Medicine

## 2014-07-03 DIAGNOSIS — Z79899 Other long term (current) drug therapy: Secondary | ICD-10-CM | POA: Insufficient documentation

## 2014-07-03 DIAGNOSIS — I5022 Chronic systolic (congestive) heart failure: Secondary | ICD-10-CM | POA: Diagnosis not present

## 2014-07-03 DIAGNOSIS — Z720 Tobacco use: Secondary | ICD-10-CM | POA: Diagnosis not present

## 2014-07-03 DIAGNOSIS — F329 Major depressive disorder, single episode, unspecified: Secondary | ICD-10-CM | POA: Diagnosis not present

## 2014-07-03 DIAGNOSIS — Z7982 Long term (current) use of aspirin: Secondary | ICD-10-CM | POA: Insufficient documentation

## 2014-07-03 DIAGNOSIS — I252 Old myocardial infarction: Secondary | ICD-10-CM | POA: Diagnosis not present

## 2014-07-03 DIAGNOSIS — S81002A Unspecified open wound, left knee, initial encounter: Secondary | ICD-10-CM | POA: Diagnosis not present

## 2014-07-03 DIAGNOSIS — Z872 Personal history of diseases of the skin and subcutaneous tissue: Secondary | ICD-10-CM | POA: Diagnosis not present

## 2014-07-03 DIAGNOSIS — S81819A Laceration without foreign body, unspecified lower leg, initial encounter: Secondary | ICD-10-CM

## 2014-07-03 DIAGNOSIS — Z8639 Personal history of other endocrine, nutritional and metabolic disease: Secondary | ICD-10-CM | POA: Insufficient documentation

## 2014-07-03 DIAGNOSIS — M109 Gout, unspecified: Secondary | ICD-10-CM | POA: Diagnosis not present

## 2014-07-03 DIAGNOSIS — M25572 Pain in left ankle and joints of left foot: Secondary | ICD-10-CM

## 2014-07-03 DIAGNOSIS — W07XXXA Fall from chair, initial encounter: Secondary | ICD-10-CM | POA: Insufficient documentation

## 2014-07-03 DIAGNOSIS — Z8673 Personal history of transient ischemic attack (TIA), and cerebral infarction without residual deficits: Secondary | ICD-10-CM | POA: Diagnosis not present

## 2014-07-03 DIAGNOSIS — Z95810 Presence of automatic (implantable) cardiac defibrillator: Secondary | ICD-10-CM | POA: Diagnosis not present

## 2014-07-03 DIAGNOSIS — I2510 Atherosclerotic heart disease of native coronary artery without angina pectoris: Secondary | ICD-10-CM | POA: Insufficient documentation

## 2014-07-03 DIAGNOSIS — S9002XA Contusion of left ankle, initial encounter: Secondary | ICD-10-CM | POA: Diagnosis not present

## 2014-07-03 DIAGNOSIS — D649 Anemia, unspecified: Secondary | ICD-10-CM | POA: Diagnosis not present

## 2014-07-03 DIAGNOSIS — S51812A Laceration without foreign body of left forearm, initial encounter: Secondary | ICD-10-CM | POA: Insufficient documentation

## 2014-07-03 DIAGNOSIS — Z86718 Personal history of other venous thrombosis and embolism: Secondary | ICD-10-CM | POA: Diagnosis not present

## 2014-07-03 DIAGNOSIS — S60812A Abrasion of left wrist, initial encounter: Secondary | ICD-10-CM | POA: Insufficient documentation

## 2014-07-03 DIAGNOSIS — S8002XA Contusion of left knee, initial encounter: Secondary | ICD-10-CM | POA: Insufficient documentation

## 2014-07-03 DIAGNOSIS — M1990 Unspecified osteoarthritis, unspecified site: Secondary | ICD-10-CM | POA: Diagnosis not present

## 2014-07-03 DIAGNOSIS — Z8669 Personal history of other diseases of the nervous system and sense organs: Secondary | ICD-10-CM | POA: Diagnosis not present

## 2014-07-03 DIAGNOSIS — Y9389 Activity, other specified: Secondary | ICD-10-CM | POA: Insufficient documentation

## 2014-07-03 DIAGNOSIS — N189 Chronic kidney disease, unspecified: Secondary | ICD-10-CM | POA: Diagnosis not present

## 2014-07-03 DIAGNOSIS — K219 Gastro-esophageal reflux disease without esophagitis: Secondary | ICD-10-CM | POA: Diagnosis not present

## 2014-07-03 DIAGNOSIS — W19XXXA Unspecified fall, initial encounter: Secondary | ICD-10-CM

## 2014-07-03 DIAGNOSIS — G8929 Other chronic pain: Secondary | ICD-10-CM | POA: Insufficient documentation

## 2014-07-03 DIAGNOSIS — J449 Chronic obstructive pulmonary disease, unspecified: Secondary | ICD-10-CM | POA: Diagnosis not present

## 2014-07-03 DIAGNOSIS — Y92019 Unspecified place in single-family (private) house as the place of occurrence of the external cause: Secondary | ICD-10-CM | POA: Diagnosis not present

## 2014-07-03 DIAGNOSIS — S81011A Laceration without foreign body, right knee, initial encounter: Secondary | ICD-10-CM | POA: Diagnosis not present

## 2014-07-03 DIAGNOSIS — S6992XA Unspecified injury of left wrist, hand and finger(s), initial encounter: Secondary | ICD-10-CM | POA: Diagnosis present

## 2014-07-03 DIAGNOSIS — Z7951 Long term (current) use of inhaled steroids: Secondary | ICD-10-CM | POA: Insufficient documentation

## 2014-07-03 DIAGNOSIS — M25562 Pain in left knee: Secondary | ICD-10-CM

## 2014-07-03 DIAGNOSIS — M25561 Pain in right knee: Secondary | ICD-10-CM

## 2014-07-03 MED ORDER — HYDROCODONE-ACETAMINOPHEN 5-325 MG PO TABS
1.0000 | ORAL_TABLET | Freq: Once | ORAL | Status: AC
  Administered 2014-07-03: 1 via ORAL
  Filled 2014-07-03: qty 1

## 2014-07-03 MED ORDER — HYDROCODONE-ACETAMINOPHEN 5-325 MG PO TABS
1.0000 | ORAL_TABLET | ORAL | Status: DC | PRN
Start: 2014-07-03 — End: 2015-05-09

## 2014-07-03 NOTE — ED Provider Notes (Signed)
Medical screening examination/treatment/procedure(s) were conducted as a shared visit with non-physician practitioner(s) and myself.  I personally evaluated the patient during the encounter.   EKG Interpretation None     The patient describes a mechanical fall from her chair this is resulted in significant skin tears to both of her knees as well as her left wrist.  Physical examination shows the patient to be alert with clear mental status and no respiratory distress. She does have large skin tears as described in the PA note.  At this time I agree with the management proposed and have reviewed this with the patient and her daughter.  Charlesetta Shanks, MD 07/03/14 [phone]

## 2014-07-03 NOTE — ED Notes (Signed)
Initial Contact - pt A+Ox3, reports falling from a chair last night when she attempted to stand.  Pt denies dizziness, weakness or other complaints prior to fall.  Pt reports multiple falls over the last few months.  Pt sts "I didn't think it was that bad".  When dtr came to visit today, she called EMS.  Pt denies hitting head or LOC with fall.  Pt denies pain or other complaints at this time.  Pt with approx 1.5" skin tear to L lateral forearm, approx 3" skin tears to bilat knees and approx 1" old skin tear to R shin.  Pt also with bruising and pain on palpation to L ankle, pt reports from fall in July and had radiographs done approx 1 mo ago, which pt reports were negative.  Dressings removed from skin tears except for R knee tear, unable to removed dressing at this time and will wait for EDP.  Speaking full/clear sentences, rr even/un-lab.  MAEI.  +csm/+pulses.  NAD.

## 2014-07-03 NOTE — Discharge Instructions (Signed)
Keep wound clean and area covered with a bandage, keep bandage dry, and do not submerge in water for 24 hours. Ice and elevate for additional pain relief and swelling. Take norco for pain control, but don't drive or operate machinery. Follow up with the wound clinic listed above in 2 days for wound recheck and ongoing management of your wounds. Monitor area for signs of infection to include, but not limited to: increasing pain, redness, drainage/pus, or swelling. For your knee and ankle pain, ice and elevate the areas and take pain medications as directed. Return to emergency department for emergent changing or worsening symptoms.   Skin Tear Care A skin tear is when the top layer of skin peels off. To repair the skin, your doctor may use:   Tape.  Skin adhesive strips. HOME CARE  Change bandages (dressings) once a day or as told by your doctor.  Gently clean the area with salt (saline) solution or with a mild soap and water.  Do not rub the injured skin dry. Let the area air dry.  Put petroleum jelly or antibiotic cream on the tear. Do not allow a scab to form.  If the bandage sticks, moisten it with warm soapy water and remove it.  Protect the injured skin until it has healed.  Only take medicine as told by your doctor.  Take showers or baths using warm soapy water. Apply a new bandage after the shower or bath.  Keep all doctor visits as told. GET HELP RIGHT AWAY IF:   You have redness, puffiness (swelling), or more pain in the tear.  You have ayellowish-white fluid (pus) coming from the tear.  You have chills.  You have a red streak that goes away from the tear.  You have a bad smell coming from the tear or bandage.  You have a fever or lasting symptoms for more than 2-3 days.  You have a fever and your symptoms suddenly get worse. MAKE SURE YOU:   Understand these instructions.  Will watch this condition.  Will get help right away if you are not doing well or get  worse. Document Released: 06/12/2008 Document Revised: 05/28/2012 Document Reviewed: 03/17/2012 [facility] Patient Information 2015 [place], [place]. This information is not intended to replace advice given to you by your health care provider. Make sure you discuss any questions you have with your health care provider.  Wound Care Wound care helps prevent pain and infection.  You may need a tetanus shot if:  You cannot remember when you had your last tetanus shot.  You have never had a tetanus shot.  The injury broke your skin. If you need a tetanus shot and you choose not to have one, you may get tetanus. Sickness from tetanus can be serious. HOME CARE   Only take medicine as told by your doctor.  Clean the wound daily with mild soap and water.  Change any bandages (dressings) as told by your doctor.  Put medicated cream and a bandage on the wound as told by your doctor.  Change the bandage if it gets wet, dirty, or starts to smell.  Take showers. Do not take baths, swim, or do anything that puts your wound under water.  Rest and raise (elevate) the wound until the pain and puffiness (swelling) are better.  Keep all doctor visits as told. GET HELP RIGHT AWAY IF:   Yellowish-white fluid (pus) comes from the wound.  Medicine does not lessen your pain.  There is a red  streak going away from the wound.  You have a fever. MAKE SURE YOU:   Understand these instructions.  Will watch your condition.  Will get help right away if you are not doing well or get worse. Document Released: 06/12/2008 Document Revised: 11/26/2011 Document Reviewed: 01/07/2011 [facility] Patient Information 2015 [place], [place]. This information is not intended to replace advice given to you by your health care provider. Make sure you discuss any questions you have with your health care provider.  Musculoskeletal Pain Musculoskeletal pain is muscle and boney aches and pains. These pains can occur in any  part of the body. Your caregiver may treat you without knowing the cause of the pain. They may treat you if blood or urine tests, X-rays, and other tests were normal.  CAUSES There is often not a definite cause or reason for these pains. These pains may be caused by a type of germ (virus). The discomfort may also come from overuse. Overuse includes working out too hard when your body is not fit. Boney aches also come from weather changes. Bone is sensitive to atmospheric pressure changes. HOME CARE INSTRUCTIONS   Ask when your test results will be ready. Make sure you get your test results.  Only take over-the-counter or prescription medicines for pain, discomfort, or fever as directed by your caregiver. If you were given medications for your condition, do not drive, operate machinery or power tools, or sign legal documents for 24 hours. Do not drink alcohol. Do not take sleeping pills or other medications that may interfere with treatment.  Continue all activities unless the activities cause more pain. When the pain lessens, slowly resume normal activities. Gradually increase the intensity and duration of the activities or exercise.  During periods of severe pain, bed rest may be helpful. Lay or sit in any position that is comfortable.  Putting ice on the injured area.  Put ice in a bag.  Place a towel between your skin and the bag.  Leave the ice on for 15 to 20 minutes, 3 to 4 times a day.  Follow up with your caregiver for continued problems and no reason can be found for the pain. If the pain becomes worse or does not go away, it may be necessary to repeat tests or do additional testing. Your caregiver may need to look further for a possible cause. SEEK IMMEDIATE MEDICAL CARE IF:  You have pain that is getting worse and is not relieved by medications.  You develop chest pain that is associated with shortness or breath, sweating, feeling sick to your stomach (nauseous), or throw up  (vomit).  Your pain becomes localized to the abdomen.  You develop any new symptoms that seem different or that concern you. MAKE SURE YOU:   Understand these instructions.  Will watch your condition.  Will get help right away if you are not doing well or get worse. Document Released: 09/03/2005 Document Revised: 11/26/2011 Document Reviewed: 05/08/2013 [facility] Patient Information 2015 [place], [place]. This information is not intended to replace advice given to you by your health care provider. Make sure you discuss any questions you have with your health care provider.  Cryotherapy Cryotherapy is when you put ice on your injury. Ice helps lessen pain and puffiness (swelling) after an injury. Ice works the best when you start using it in the first 24 to 48 hours after an injury. HOME CARE  Put a dry or damp towel between the ice pack and your skin.  You  may press gently on the ice pack.  Leave the ice on for no more than 10 to 20 minutes at a time.  Check your skin after 5 minutes to make sure your skin is okay.  Rest at least 20 minutes between ice pack uses.  Stop using ice when your skin loses feeling (numbness).  Do not use ice on someone who cannot tell you when it hurts. This includes small children and people with memory problems (dementia). GET HELP RIGHT AWAY IF:  You have white spots on your skin.  Your skin turns blue or pale.  Your skin feels waxy or hard.  Your puffiness gets worse. MAKE SURE YOU:   Understand these instructions.  Will watch your condition.  Will get help right away if you are not doing well or get worse. Document Released: 02/20/2008 Document Revised: 11/26/2011 Document Reviewed: 04/26/2011 [facility] Patient Information 2015 [place], [place]. This information is not intended to replace advice given to you by your health care provider. Make sure you discuss any questions you have with your health care provider.  Fall Prevention and Home  Safety Falls cause injuries and can affect all age groups. It is possible to use preventive measures to significantly decrease the likelihood of falls. There are many simple measures which can make your home safer and prevent falls. OUTDOORS  Repair cracks and edges of walkways and driveways.  Remove high doorway thresholds.  Trim shrubbery on the main path into your home.  Have good outside lighting.  Clear walkways of tools, rocks, debris, and clutter.  Check that handrails are not broken and are securely fastened. Both sides of steps should have handrails.  Have leaves, snow, and ice cleared regularly.  Use sand or salt on walkways during winter months.  In the garage, clean up grease or oil spills. BATHROOM  Install night lights.  Install grab bars by the toilet and in the tub and shower.  Use non-skid mats or decals in the tub or shower.  Place a plastic non-slip stool in the shower to sit on, if needed.  Keep floors dry and clean up all water on the floor immediately.  Remove soap buildup in the tub or shower on a regular basis.  Secure bath mats with non-slip, double-sided rug tape.  Remove throw rugs and tripping hazards from the floors. BEDROOMS  Install night lights.  Make sure a bedside light is easy to reach.  Do not use oversized bedding.  Keep a telephone by your bedside.  Have a firm chair with side arms to use for getting dressed.  Remove throw rugs and tripping hazards from the floor. KITCHEN  Keep handles on pots and pans turned toward the center of the stove. Use back burners when possible.  Clean up spills quickly and allow time for drying.  Avoid walking on wet floors.  Avoid hot utensils and knives.  Position shelves so they are not too high or low.  Place commonly used objects within easy reach.  If necessary, use a sturdy step stool with a grab bar when reaching.  Keep electrical cables out of the way.  Do not use floor  polish or wax that makes floors slippery. If you must use wax, use non-skid floor wax.  Remove throw rugs and tripping hazards from the floor. STAIRWAYS  Never leave objects on stairs.  Place handrails on both sides of stairways and use them. Fix any loose handrails. Make sure handrails on both sides of the stairways are  as long as the stairs.  Check carpeting to make sure it is firmly attached along stairs. Make repairs to worn or loose carpet promptly.  Avoid placing throw rugs at the top or bottom of stairways, or properly secure the rug with carpet tape to prevent slippage. Get rid of throw rugs, if possible.  Have an electrician put in a light switch at the top and bottom of the stairs. OTHER FALL PREVENTION TIPS  Wear low-heel or rubber-soled shoes that are supportive and fit well. Wear closed toe shoes.  When using a stepladder, make sure it is fully opened and both spreaders are firmly locked. Do not climb a closed stepladder.  Add color or contrast paint or tape to grab bars and handrails in your home. Place contrasting color strips on first and last steps.  Learn and use mobility aids as needed. Install an electrical emergency response system.  Turn on lights to avoid dark areas. Replace light bulbs that burn out immediately. Get light switches that glow.  Arrange furniture to create clear pathways. Keep furniture in the same place.  Firmly attach carpet with non-skid or double-sided tape.  Eliminate uneven floor surfaces.  Select a carpet pattern that does not visually hide the edge of steps.  Be aware of all pets. OTHER HOME SAFETY TIPS  Set the water temperature for 120 F (48.8 C).  Keep emergency numbers on or near the telephone.  Keep smoke detectors on every level of the home and near sleeping areas. Document Released: 08/24/2002 Document Revised: 03/04/2012 Document Reviewed: 11/23/2011 [facility] Patient Information 2015 [place], [place]. This information  is not intended to replace advice given to you by your health care provider. Make sure you discuss any questions you have with your health care provider.

## 2014-07-03 NOTE — ED Provider Notes (Signed)
CSN: [id number]     Arrival date & time 07/03/14  1452 History   None    Chief Complaint  Patient presents with  . Fall  . Skin Problem    skin tears to bilat knees     (Consider location/radiation/quality/duration/timing/severity/associated sxs/prior Treatment) HPI Comments: [name] is a 68 y.o. female with a PMHx of PVD, arthritis, CAD, CHF, anemia, HLD, GERD, RLS, chronic lower leg pain, COPD, CVA, DVT, CKD, skin cancer, gout, and OA, who presents to the ED via EMS accompanied by her husband and her daughter, presents for fall from a chair that occurred last night around 8pm onto a carpeted surface. Pt's family states she falls often, and pt states she "got tripped up" which caused the fall. Denies feeling pre-syncopal or lightheaded prior to the fall. She sustained skin tears to both knees, L wrist, and injured the L ankle during the fall. She takes ASA 81mg  daily and when she has skin tears it takes quite some time to stop the bleeding. She placed a bandage over the knees, and the gauze saturated through overnight, and the bandage on the R knee cannot be removed now which prompted them to come into the ED. Pain in her knees and ankle is 0/10 now after taking 2 tabs of 5-325mg  hydrocodone, throbbing, constant but improved, nonradiating, worse with movement, but improved with pain meds taken PTA. Wounds are still oozing blood. Pt denies head injury or LOC. Denies any other pain in other joints or areas of her body. States the L ankle is now swollen and bruised. Tetanus UTD 03/2014. Denies fevers, chills, CP, SOB, neck pain, back pain, other joint pain, numbness, tingling, paresthesias, or weakness. Has been ambulatory since the fall.   Patient is a 68 y.o. female presenting with knee pain and ankle pain. The history is provided by the patient. No language interpreter was used.  Knee Pain Location:  Knee Time since incident:  19 hours Injury: yes   Mechanism of injury: fall   Fall:   Fall occurred: from a chair.   Height of fall:  Chair height   Impact surface:  [employer] of impact:  Knees   Entrapped after fall: no   Knee location:  L knee and R knee Pain details:    Quality:  Throbbing   Radiates to:  Does not radiate   Severity:  No pain (no pain now after pain meds)   Onset quality:  Sudden   Duration:  19 hours   Timing:  Intermittent   Progression:  Partially resolved Chronicity:  New Dislocation: no   Foreign body present:  No foreign bodies Prior injury to area:  No Relieved by: 2 hydrocodone tabs 5-325mg . Exacerbated by: movement. Ineffective treatments:  None tried Associated symptoms: decreased ROM   Associated symptoms: no back pain, no fever, no muscle weakness, no neck pain, no numbness, no stiffness, no swelling and no tingling   Ankle Pain Location:  Ankle Time since incident:  19 hours Injury: yes   Mechanism of injury: fall   Ankle location:  L ankle Pain details:    Quality:  Throbbing   Radiates to:  Does not radiate   Severity:  No pain (after pain meds)   Onset quality:  Sudden   Timing:  Constant   Progression:  Partially resolved Chronicity:  New Dislocation: no   Foreign body present:  No foreign bodies Prior injury to area:  No Relieved by: 2 tabs of  5-325mg  hydrocodone. Exacerbated by: movement. Ineffective treatments:  None tried Associated symptoms: decreased ROM   Associated symptoms: no back pain, no fever, no muscle weakness, no neck pain, no numbness, no stiffness, no swelling and no tingling     Past Medical History  Diagnosis Date  . PVD (peripheral vascular disease)     status post prior femoral-popliteal bypass  . Back pain   . Arthritis   . CAD (coronary artery disease)     status post prior stenting to the proximal RCA, status post anterior STEMI 10/2008 (RCA occluded at the prior stent, LAD treated with a bare metal stent and a drug-eluting stent),  . Chronic systolic heart failure     echo  11/2011: EF 25-30%, AS and apical AK, trivial AI, mild MR, PASP 34.  . Anemia   . Ischemic cardiomyopathy   . AICD (automatic cardioverter/defibrillator) present   . HLD (hyperlipidemia)   . GERD (gastroesophageal reflux disease)   . RLS (restless legs syndrome)   . Depression   . Tobacco abuse   . Pain in joint, lower leg   . Depressive disorder, not elsewhere classified   . Acute myocardial infarction of other anterior wall, subsequent episode of care 2010  . CHF (congestive heart failure)   . COPD (chronic obstructive pulmonary disease)   . Stroke   . DVT (deep venous thrombosis)   . Chronic kidney disease   . Cancer     Skin  of Left  Face  . Mixed hyperlipidemia   . Gout   . Atherosclerosis of native arteries of the extremities with intermittent claudication   . Osteoarthritis   . Eczema    Past Surgical History  Procedure Laterality Date  . Psychologist, forensic    . Foot surgery    . Pr vein bypass graft,aorto-fem-pop      fem fem  04/07/09  . Abdominal hysterectomy    . Cardiac defibrillator placement    . Eye surgery  2013    Cataract bilateral   . Axillary-femoral bypass graft Right 06/29/2013    Procedure: BYPASS GRAFT AXILLA-RIGHT FEMORAL;  Surgeon: Rosetta Posner, MD;  Location: [facility];  Service: Vascular;  Laterality: Right;  . Femoral-tibial bypass graft  2002  . Femoral-femoral bypass graft  03/2009    S/P left to right fem-fem bypass graft   Family History  Problem Relation Age of Onset  . Peripheral vascular disease Sister   . Heart disease Brother   . CAD Brother     With stent  . Diabetes Mother   . Varicose Veins Mother   . Peripheral vascular disease Mother   . Hypertension Mother   . Heart attack Father   . CAD Father    History  Substance Use Topics  . Smoking status: Current Every Day Smoker -- 1.00 packs/day for 55 years    Types: Cigarettes  . Smokeless tobacco: Never Used  . Alcohol Use: No   OB History   Grav Para Term Preterm Abortions TAB  SAB Ect Mult Living                 Review of Systems  Constitutional: Negative for fever and chills.  HENT: Negative for facial swelling.   Respiratory: Negative for shortness of breath.   Cardiovascular: Negative for chest pain and leg swelling.  Gastrointestinal: Negative for nausea and vomiting.  Musculoskeletal: Positive for arthralgias (L ankle, b/l knees) and joint swelling (L ankle). Negative for back pain, neck pain,  neck stiffness and stiffness.  Skin: Positive for color change (L ankle bruising) and wound (L wrist, b/l knees).  Neurological: Negative for dizziness, syncope, weakness, light-headedness and headaches.  Hematological: Bruises/bleeds easily.  Psychiatric/Behavioral: Negative for confusion.    10 Systems reviewed and are negative for acute change except as noted in the HPI.   Allergies  Morphine and related; Gabapentin; Naproxen; and Prednisone  Home Medications   Prior to Admission medications   Medication Sig Start Date End Date Taking? Authorizing Provider  allopurinol (ZYLOPRIM) 100 MG tablet Take 100 mg by mouth daily.  09/28/13   Historical Provider, MD  aspirin EC 81 MG tablet Take 81 mg by mouth daily.    Historical Provider, MD  Cholecalciferol (VITAMIN D) 1000 UNITS capsule Take 1,000 Units by mouth daily.      Historical Provider, MD  clonazePAM (KLONOPIN) 1 MG tablet Take 2 mg by mouth at bedtime.     Historical Provider, MD  estrogens, conjugated, (PREMARIN) 1.25 MG tablet Take 1.25 mg by mouth daily.    Historical Provider, MD  ferrous sulfate 325 (65 FE) MG tablet Take 325 mg by mouth daily with breakfast.      Historical Provider, MD  fluticasone (FLONASE) 50 MCG/ACT nasal spray Place 2 sprays into both nostrils daily. 04/12/14   Historical Provider, MD  furosemide (LASIX) 20 MG tablet Take 1.5 tablets (30 mg total) by mouth daily. 02/03/14   Evans Lance, MD  HYDROcodone-acetaminophen (NORCO) 10-325 MG per tablet Take 1-2 tablets by mouth 4  (four) times daily as needed (pain).  09/21/13   Historical Provider, MD  KLOR-CON M20 20 MEQ tablet TAKE 1 TABLET BY MOUTH ON MONDAYS,WEDNESDAYS, AND FRIDAYS 05/26/14   Evans Lance, MD  metoprolol succinate (TOPROL-XL) 50 MG 24 hr tablet Take 1 tablet (50 mg total) by mouth daily. 02/22/12   Evans Lance, MD  nitroGLYCERIN (NITROSTAT) 0.4 MG SL tablet Place 0.4 mg under the tongue every 5 (five) minutes as needed for chest pain.    Historical Provider, MD  pantoprazole (PROTONIX) 40 MG tablet Take 40 mg by mouth daily.      Historical Provider, MD  PRESCRIPTION MEDICATION Apply 1 application topically daily as needed (itching). Compounded cream:  Ketoconazole 2% cream/ fluticasone 0.05% cream 1:1    Historical Provider, MD  ramipril (ALTACE) 2.5 MG capsule TAKE ONE CAPSULE BY MOUTH DAILY 04/16/14   Evans Lance, MD  zolpidem (AMBIEN) 10 MG tablet Take 10 mg by mouth at bedtime.     Historical Provider, MD   BP 136/74  Pulse 80  Temp(Src) 98.1 F (36.7 C) (Oral)  SpO2 95% Physical Exam  Nursing note and vitals reviewed. Constitutional: She is oriented to person, place, and time. Vital signs are normal. She appears well-developed and well-nourished. No distress.  NAD, VSS  HENT:  Head: Normocephalic and atraumatic. Head is without abrasion and without contusion.  Mouth/Throat: Mucous membranes are normal.  [place]/AT, no scalp tenderness or abrasions/contusions  Eyes: Conjunctivae and EOM are normal. Right eye exhibits no discharge. Left eye exhibits no discharge.  Neck: Normal range of motion. Neck supple. No spinous process tenderness and no muscular tenderness present. No rigidity. Normal range of motion present.  Cardiovascular: Normal rate and intact distal pulses.   Pulses:      Dorsalis pedis pulses are 2+ on the right side, and 1+ on the left side.  Distal pulses intact, Left DP difficult to palpate but doppler able to locate pulse.  Pt states this is baseline for her  Pulmonary/Chest:  Effort normal. No respiratory distress.  Abdominal: Normal appearance. She exhibits no distension.  Musculoskeletal:       Left wrist: She exhibits laceration (small abrasion). She exhibits normal range of motion, no tenderness and no bony tenderness.       Right knee: She exhibits decreased range of motion (due to pain) and laceration (small abrasion). She exhibits no swelling, no effusion, no ecchymosis, normal alignment, no LCL laxity, normal patellar mobility and no MCL laxity. Tenderness (diffusely) found.       Left knee: She exhibits decreased range of motion (due to pain), swelling, ecchymosis and laceration (small abrasion). She exhibits no deformity, normal alignment, no LCL laxity, normal patellar mobility and no MCL laxity. Tenderness (diffusely) found.       Left ankle: She exhibits decreased range of motion (due to pain), swelling and ecchymosis. She exhibits normal pulse. Tenderness. Lateral malleolus tenderness found. Achilles tendon normal.       Legs:      Feet:  Bilateral knees with small skin tears. Diffusely tender throughout anterior knee, decreased ROM secondary to pain and unwillingness. Left knee with mild swelling on lateral aspect of joint space.  L ankle with limited ROM due to pain, swelling and bruising noted, tender to palpation over lateral malleolus. Strength limited secondary to pain, sensation grossly intact in all extremities, distal pulses intact in all extremities. Soft compartments. L wrist with small abrasion over medial aspect, just proximal to the wrist. FROM intact with grip and finger abduction strength 5/5. Sensation grossly intact. Pulses intact.   Neurological: She is alert and oriented to person, place, and time. No sensory deficit.  Strength as above Sensation grossly intact A&O x4  Skin: Skin is warm and dry. Abrasion noted.  3 skin tears as noted above  Psychiatric: She has a normal mood and affect. Cognition and memory are normal.  Memory intact    Right Knee   Left Knee   Left wrist     ED Course  Procedures (including critical care time) Labs Review Labs Reviewed - No data to display  Imaging Review Dg Ankle Complete Left  07/03/2014   CLINICAL DATA:  Status post fall last night. Left ankle pain. Initially count.  EXAM: LEFT ANKLE COMPLETE - 3+ VIEW  COMPARISON:  Plain films of the left foot 05/11/2014.  FINDINGS: No acute bony or joint abnormality is identified. Small calcaneal spur postoperative change of hallux valgus repair noted.  IMPRESSION: No acute abnormality.   Electronically Signed   By: Inge Rise M.D.   On: 07/03/2014 16:15   Dg Knee Complete 4 Views Left  07/03/2014   CLINICAL DATA:  Reports falling from a chair last night when she attempted to stand. Pt denies dizziness, weakness or other complaints prior to fall. Pt reports multiple falls over the last few months. Pt states "I didn't think it was that bad". Pt denies hitting head or LOC with fall.  EXAM: LEFT KNEE - COMPLETE 4+ VIEW  COMPARISON:  None.  FINDINGS: There is generalized osteopenia. There is no evidence of fracture, dislocation, or joint effusion. There is no evidence of arthropathy or other focal bone abnormality. There are surgical clips along the posterior left leg. Soft tissues are unremarkable.  IMPRESSION: No acute osseous injury of the left knee.   Electronically Signed   By: Kathreen Devoid   On: 07/03/2014 16:15   Dg Knee Complete 4 Views Right  07/03/2014   CLINICAL DATA:  Fall from chair last night when attempting to stand.  EXAM: RIGHT KNEE - COMPLETE 4+ VIEW  COMPARISON:  None.  FINDINGS: There is diffuse decreased bone mineralization. There is no acute fracture or dislocation. There is no joint effusion. There are multiple surgical clips over the medial soft tissues.  IMPRESSION: No acute findings.   Electronically Signed   By: Marin Olp M.D.   On: 07/03/2014 16:15     EKG Interpretation None      MDM   Final  diagnoses:  Knee pain, bilateral  Skin tear of lower leg without complication, unspecified laterality, initial encounter  Skin tear of forearm without complication, left, initial encounter  Ankle pain, left  Fall, initial encounter    68y/o female with fall at home yesterday, ambulatory afterwards, but having skin tears that won't stop bleeding. On ASA 81mg . No need for INR given no coumadin. This is similar to prior episodes of fall and skin tears. UTD on tetanus. Given bruising and pain in knees and ankle, obtained xrays which are neg. Able to apply wound seal and bleeding ceased. Dressed area with telfa and loose gauze, discussed RICE therapy for joint pains. Pt ambulatory and wouldn't be assisted by walker or crutches, will send home with family and discussed proper care to avoid future falls. Discussed f/up at wound clinic in 2 days for re-check and ongoing management. Doubt need for abx today, will defer until pt seen at wound clinic. Rx for pain meds given. Pain meds given here with relief. I explained the diagnosis and have given explicit precautions to return to the ER including for any other new or worsening symptoms. The patient understands and accepts the medical plan as it's been dictated and I have answered their questions. Discharge instructions concerning home care and prescriptions have been given. The patient is STABLE and is discharged to home in good condition.  BP 109/66  Pulse 73  Temp(Src) 98.1 F (36.7 C) (Oral)  Resp 20  SpO2 97%  Meds ordered this encounter  Medications  . HYDROcodone-acetaminophen (NORCO/VICODIN) 5-325 MG per tablet 1 tablet    Sig:   . HYDROcodone-acetaminophen (NORCO) 5-325 MG per tablet    Sig: Take 1 tablet by mouth every 4 (four) hours as needed for severe pain.    Dispense:  6 tablet    Refill:  0    Order Specific Question:  Supervising Provider    Answer:  Johnna Acosta [[id number]]       [place], PA-C 07/03/14  1825

## 2014-07-03 NOTE — ED Notes (Signed)
Patient transported to X-ray 

## 2014-07-03 NOTE — ED Notes (Signed)
PER EMS - pt from home with c/o fall from chair last night around 2100 onto bilat knees.  Pt c/o bilat knee pain, took own pain medication prior to EMS arrival.  Pt has large skin tears to BLE, bleeding controlled.  Pt is on blood thinners.  EMS reports pt has an adhesive trauma bandage to R knee and they did not remove it as they were concerned about bleeding.  Pt denies other complaints and did not want transport to hospital, family convinced pt to come to ER.

## 2014-07-03 NOTE — ED Notes (Signed)
Bed: WA01 Expected date: 07/03/14 Expected time: 2:49 PM Means of arrival: Ambulance Comments: fall

## 2014-07-20 ENCOUNTER — Encounter: Payer: Self-pay | Admitting: Family

## 2014-07-21 ENCOUNTER — Ambulatory Visit (HOSPITAL_COMMUNITY)
Admission: RE | Admit: 2014-07-21 | Discharge: 2014-07-21 | Disposition: A | Discharge status: Home / Self Care | Payer: Medicare PPO | Source: Ambulatory Visit | Attending: Family | Admitting: Family

## 2014-07-21 ENCOUNTER — Encounter: Payer: Self-pay | Admitting: Family

## 2014-07-21 ENCOUNTER — Ambulatory Visit (INDEPENDENT_AMBULATORY_CARE_PROVIDER_SITE_OTHER): Payer: Commercial Managed Care - HMO | Admitting: Family

## 2014-07-21 ENCOUNTER — Other Ambulatory Visit: Payer: Self-pay | Admitting: Family

## 2014-07-21 ENCOUNTER — Ambulatory Visit (INDEPENDENT_AMBULATORY_CARE_PROVIDER_SITE_OTHER)
Admission: RE | Admit: 2014-07-21 | Discharge: 2014-07-21 | Disposition: A | Discharge status: Home / Self Care | Payer: Medicare PPO | Source: Ambulatory Visit | Attending: Family | Admitting: Family

## 2014-07-21 VITALS — BP 118/78 | HR 62 | Resp 14 | Ht 63.0 in | Wt 120.0 lb

## 2014-07-21 DIAGNOSIS — Z48812 Encounter for surgical aftercare following surgery on the circulatory system: Secondary | ICD-10-CM

## 2014-07-21 DIAGNOSIS — R202 Paresthesia of skin: Secondary | ICD-10-CM | POA: Insufficient documentation

## 2014-07-21 DIAGNOSIS — I739 Peripheral vascular disease, unspecified: Secondary | ICD-10-CM

## 2014-07-21 DIAGNOSIS — I6523 Occlusion and stenosis of bilateral carotid arteries: Secondary | ICD-10-CM

## 2014-07-21 DIAGNOSIS — Z8673 Personal history of transient ischemic attack (TIA), and cerebral infarction without residual deficits: Secondary | ICD-10-CM | POA: Insufficient documentation

## 2014-07-21 NOTE — Patient Instructions (Signed)
Peripheral Vascular Disease Peripheral Vascular Disease (PVD), also called Peripheral Arterial Disease (PAD), is a circulation problem caused by cholesterol (atherosclerotic plaque) deposits in the arteries. PVD commonly occurs in the lower extremities (legs) but it can occur in other areas of the body, such as your arms. The cholesterol buildup in the arteries reduces blood flow which can cause pain and other serious problems. The presence of PVD can place a person at risk for Coronary Artery Disease (CAD).  CAUSES  Causes of PVD can be many. It is usually associated with more than one risk factor such as:   High Cholesterol.  Smoking.  Diabetes.  Lack of exercise or inactivity.  High blood pressure (hypertension).  Obesity.  Family history. SYMPTOMS   When the lower extremities are affected, patients with PVD may experience:  Leg pain with exertion or physical activity. This is called INTERMITTENT CLAUDICATION. This may present as cramping or numbness with physical activity. The location of the pain is associated with the level of blockage. For example, blockage at the abdominal level (distal abdominal aorta) may result in buttock or hip pain. Lower leg arterial blockage may result in calf pain.  As PVD becomes more severe, pain can develop with less physical activity.  In people with severe PVD, leg pain may occur at rest.  Other PVD signs and symptoms:  Leg numbness or weakness.  Coldness in the affected leg or foot, especially when compared to the other leg.  A change in leg color.  Patients with significant PVD are more prone to ulcers or sores on toes, feet or legs. These may take longer to heal or may reoccur. The ulcers or sores can become infected.  If signs and symptoms of PVD are ignored, gangrene may occur. This can result in the loss of toes or loss of an entire limb.  Not all leg pain is related to PVD. Other medical conditions can cause leg pain such  as:  Blood clots (embolism) or Deep Vein Thrombosis.  Inflammation of the blood vessels (vasculitis).  Spinal stenosis. DIAGNOSIS  Diagnosis of PVD can involve several different types of tests. These can include:  Pulse Volume Recording Method (PVR). This test is simple, painless and does not involve the use of X-rays. PVR involves measuring and comparing the blood pressure in the arms and legs. An ABI (Ankle-Brachial Index) is calculated. The normal ratio of blood pressures is 1. As this number becomes smaller, it indicates more severe disease.  < 0.95 - indicates significant narrowing in one or more leg vessels.  <0.8 - there will usually be pain in the foot, leg or buttock with exercise.  <0.4 - will usually have pain in the legs at rest.  <0.25 - usually indicates limb threatening PVD.  Doppler detection of pulses in the legs. This test is painless and checks to see if you have a pulses in your legs/feet.  A dye or contrast material (a substance that highlights the blood vessels so they show up on x-ray) may be given to help your caregiver better see the arteries for the following tests. The dye is eliminated from your body by the kidney's. Your caregiver may order blood work to check your kidney function and other laboratory values before the following tests are performed:  Magnetic Resonance Angiography (MRA). An MRA is a picture study of the blood vessels and arteries. The MRA machine uses a large magnet to produce images of the blood vessels.  Computed Tomography Angiography (CTA). A CTA   is a specialized x-ray that looks at how the blood flows in your blood vessels. An IV may be inserted into your arm so contrast dye can be injected.  Angiogram. Is a procedure that uses x-rays to look at your blood vessels. This procedure is minimally invasive, meaning a small incision (cut) is made in your groin. A small tube (catheter) is then inserted into the artery of your groin. The catheter  is guided to the blood vessel or artery your caregiver wants to examine. Contrast dye is injected into the catheter. X-rays are then taken of the blood vessel or artery. After the images are obtained, the catheter is taken out. TREATMENT  Treatment of PVD involves many interventions which may include:  Lifestyle changes:  Quitting smoking.  Exercise.  Following a low fat, low cholesterol diet.  Control of diabetes.  Foot care is very important to the PVD patient. Good foot care can help prevent infection.  Medication:  Cholesterol-lowering medicine.  Blood pressure medicine.  Anti-platelet drugs.  Certain medicines may reduce symptoms of Intermittent Claudication.  Interventional/Surgical options:  Angioplasty. An Angioplasty is a procedure that inflates a balloon in the blocked artery. This opens the blocked artery to improve blood flow.  Stent Implant. A wire mesh tube (stent) is placed in the artery. The stent expands and stays in place, allowing the artery to remain open.  Peripheral Bypass Surgery. This is a surgical procedure that reroutes the blood around a blocked artery to help improve blood flow. This type of procedure may be performed if Angioplasty or stent implants are not an option. SEEK IMMEDIATE MEDICAL CARE IF:   You develop pain or numbness in your arms or legs.  Your arm or leg turns cold, becomes blue in color.  You develop redness, warmth, swelling and pain in your arms or legs. MAKE SURE YOU:   Understand these instructions.  Will watch your condition.  Will get help right away if you are not doing well or get worse. Document Released: 10/11/2004 Document Revised: 11/26/2011 Document Reviewed: 09/07/2008 [employer] Patient Information 2015 [employer]. This information is not intended to replace advice given to you by your health care provider. Make sure you discuss any questions you have with your health care provider.   Stroke  Prevention Some medical conditions and behaviors are associated with an increased chance of having a stroke. You may prevent a stroke by making healthy choices and managing medical conditions. HOW CAN I REDUCE MY RISK OF HAVING A STROKE?   Stay physically active. Get at least 30 minutes of activity on most or all days.  Do not smoke. It may also be helpful to avoid exposure to secondhand smoke.  Limit alcohol use. Moderate alcohol use is considered to be:  No more than 2 drinks per day for men.  No more than 1 drink per day for nonpregnant women.  Eat healthy foods. This involves:  Eating 5 or more servings of fruits and vegetables a day.  Making dietary changes that address high blood pressure (hypertension), high cholesterol, diabetes, or obesity.  Manage your cholesterol levels.  Making food choices that are high in fiber and low in saturated fat, trans fat, and cholesterol may control cholesterol levels.  Take any prescribed medicines to control cholesterol as directed by your health care provider.  Manage your diabetes.  Controlling your carbohydrate and sugar intake is recommended to manage diabetes.  Take any prescribed medicines to control diabetes as directed by your health care provider.    Control your hypertension.  Making food choices that are low in salt (sodium), saturated fat, trans fat, and cholesterol is recommended to manage hypertension.  Take any prescribed medicines to control hypertension as directed by your health care provider.  Maintain a healthy weight.  Reducing calorie intake and making food choices that are low in sodium, saturated fat, trans fat, and cholesterol are recommended to manage weight.  Stop drug abuse.  Avoid taking birth control pills.  Talk to your health care provider about the risks of taking birth control pills if you are over 35 years old, smoke, get migraines, or have ever had a blood clot.  Get evaluated for sleep  disorders (sleep apnea).  Talk to your health care provider about getting a sleep evaluation if you snore a lot or have excessive sleepiness.  Take medicines only as directed by your health care provider.  For some people, aspirin or blood thinners (anticoagulants) are helpful in reducing the risk of forming abnormal blood clots that can lead to stroke. If you have the irregular heart rhythm of atrial fibrillation, you should be on a blood thinner unless there is a good reason you cannot take them.  Understand all your medicine instructions.  Make sure that other conditions (such as anemia or atherosclerosis) are addressed. SEEK IMMEDIATE MEDICAL CARE IF:   You have sudden weakness or numbness of the face, arm, or leg, especially on one side of the body.  Your face or eyelid droops to one side.  You have sudden confusion.  You have trouble speaking (aphasia) or understanding.  You have sudden trouble seeing in one or both eyes.  You have sudden trouble walking.  You have dizziness.  You have a loss of balance or coordination.  You have a sudden, severe headache with no known cause.  You have new chest pain or an irregular heartbeat. Any of these symptoms may represent a serious problem that is an emergency. Do not wait to see if the symptoms will go away. Get medical help at once. Call your local emergency services (911 in [place] Do not drive yourself to the hospital. Document Released: 10/11/2004 Document Revised: 01/18/2014 Document Reviewed: 03/06/2013 [employer] Patient Information 2015 [employer]. This information is not intended to replace advice given to you by your health care provider. Make sure you discuss any questions you have with your health care provider.   Smoking Cessation Quitting smoking is important to your health and has many advantages. However, it is not always easy to quit since nicotine is a very addictive drug. Oftentimes, people try 3 times or more  before being able to quit. This document explains the best ways for you to prepare to quit smoking. Quitting takes hard work and a lot of effort, but you can do it. ADVANTAGES OF QUITTING SMOKING  You will live longer, feel better, and live better.  Your body will feel the impact of quitting smoking almost immediately.  Within 20 minutes, blood pressure decreases. Your pulse returns to its normal level.  After 8 hours, carbon monoxide levels in the blood return to normal. Your oxygen level increases.  After 24 hours, the chance of having a heart attack starts to decrease. Your breath, hair, and body stop smelling like smoke.  After 48 hours, damaged nerve endings begin to recover. Your sense of taste and smell improve.  After 72 hours, the body is virtually free of nicotine. Your bronchial tubes relax and breathing becomes easier.  After 2 to 12   weeks, lungs can hold more air. Exercise becomes easier and circulation improves.  The risk of having a heart attack, stroke, cancer, or lung disease is greatly reduced.  After 1 year, the risk of coronary heart disease is cut in half.  After 5 years, the risk of stroke falls to the same as a nonsmoker.  After 10 years, the risk of lung cancer is cut in half and the risk of other cancers decreases significantly.  After 15 years, the risk of coronary heart disease drops, usually to the level of a nonsmoker.  If you are pregnant, quitting smoking will improve your chances of having a healthy baby.  The people you live with, especially any children, will be healthier.  You will have extra money to spend on things other than cigarettes. QUESTIONS TO THINK ABOUT BEFORE ATTEMPTING TO QUIT You may want to talk about your answers with your health care provider.  Why do you want to quit?  If you tried to quit in the past, what helped and what did not?  What will be the most difficult situations for you after you quit? How will you plan to  handle them?  Who can help you through the tough times? Your family? Friends? A health care provider?  What pleasures do you get from smoking? What ways can you still get pleasure if you quit? Here are some questions to ask your health care provider:  How can you help me to be successful at quitting?  What medicine do you think would be best for me and how should I take it?  What should I do if I need more help?  What is smoking withdrawal like? How can I get information on withdrawal? GET READY  Set a quit date.  Change your environment by getting rid of all cigarettes, ashtrays, matches, and lighters in your home, car, or work. Do not let people smoke in your home.  Review your past attempts to quit. Think about what worked and what did not. GET SUPPORT AND ENCOURAGEMENT You have a better chance of being successful if you have help. You can get support in many ways.  Tell your family, friends, and coworkers that you are going to quit and need their support. Ask them not to smoke around you.  Get individual, group, or telephone counseling and support. Programs are available at local hospitals and health centers. Call your local health department for information about programs in your area.  Spiritual beliefs and practices may help some smokers quit.  Download a "quit meter" on your computer to keep track of quit statistics, such as how long you have gone without smoking, cigarettes not smoked, and money saved.  Get a self-help book about quitting smoking and staying off tobacco. LEARN NEW SKILLS AND BEHAVIORS  Distract yourself from urges to smoke. Talk to someone, go for a walk, or occupy your time with a task.  Change your normal routine. Take a different route to work. Drink tea instead of coffee. Eat breakfast in a different place.  Reduce your stress. Take a hot bath, exercise, or read a book.  Plan something enjoyable to do every day. Reward yourself for not  smoking.  Explore interactive web-based programs that specialize in helping you quit. GET MEDICINE AND USE IT CORRECTLY Medicines can help you stop smoking and decrease the urge to smoke. Combining medicine with the above behavioral methods and support can greatly increase your chances of successfully quitting smoking.  Nicotine replacement therapy   helps deliver nicotine to your body without the negative effects and risks of smoking. Nicotine replacement therapy includes nicotine gum, lozenges, inhalers, nasal sprays, and skin patches. Some may be available over-the-counter and others require a prescription.  Antidepressant medicine helps people abstain from smoking, but how this works is unknown. This medicine is available by prescription.  Nicotinic receptor partial agonist medicine simulates the effect of nicotine in your brain. This medicine is available by prescription. Ask your health care provider for advice about which medicines to use and how to use them based on your health history. Your health care provider will tell you what side effects to look out for if you choose to be on a medicine or therapy. Carefully read the information on the package. Do not use any other product containing nicotine while using a nicotine replacement product.  RELAPSE OR DIFFICULT SITUATIONS Most relapses occur within the first 3 months after quitting. Do not be discouraged if you start smoking again. Remember, most people try several times before finally quitting. You may have symptoms of withdrawal because your body is used to nicotine. You may crave cigarettes, be irritable, feel very hungry, cough often, get headaches, or have difficulty concentrating. The withdrawal symptoms are only temporary. They are strongest when you first quit, but they will go away within 10-14 days. To reduce the chances of relapse, try to:  Avoid drinking alcohol. Drinking lowers your chances of successfully quitting.  Reduce the  amount of caffeine you consume. Once you quit smoking, the amount of caffeine in your body increases and can give you symptoms, such as a rapid heartbeat, sweating, and anxiety.  Avoid smokers because they can make you want to smoke.  Do not let weight gain distract you. Many smokers will gain weight when they quit, usually less than 10 pounds. Eat a healthy diet and stay active. You can always lose the weight gained after you quit.  Find ways to improve your mood other than smoking. FOR MORE INFORMATION  www.smokefree.gov  Document Released: 08/28/2001 Document Revised: 01/18/2014 Document Reviewed: 12/13/2011 [employer] Patient Information 2015 [employer]. This information is not intended to replace advice given to you by your health care provider. Make sure you discuss any questions you have with your health care provider.  

## 2014-07-21 NOTE — Progress Notes (Signed)
[facility] [place] HISTORY AND PHYSICAL   MRN : [id number]  History of Present Illness:   [name] is a 68 y.o. female patient of Dr. Kellie Simmering who returns for continued followup regarding her severe lower extremity occlusive disease. She had an urgent right axillofemoral bypass by Dr. Donnetta Hutching in October of 2014. She had a previous femoral-femoral bypass performed by Dr. Kellie Simmering and has had bilateral femoral-popliteal bypass grafts. She has severe left hip to ankle pain aggravated by lying in bed, not with walking. She has known lumbar spine issues and was receiving ESI's, she states with no benefit. Pt also states that she has rare sciatic type radiating pain in her right leg. She is also getting unknown type of injections in both knees.  Pt denies non healing wounds. Pt states she had a "light stroke" about 2007, she does not remember how this manifested, denies hemiparesis, denies aphasia. Had an MI in 2010, had stents placed, also has a pacemaker and defibrillator. Has multiple purpuric areas on arms, states she bruises easily. She has fallen twice since July,  States she was medically evaluated after each fall, has several abrasions on legs.  Pt Diabetic: No Pt smoker: smoker (1 ppd, started at age 61 yrs)  Pt meds include: Statin :Yes ASA: Yes, 81 mg daily Other anticoagulants/antiplatelets: no    Current Outpatient Prescriptions  Medication Sig Dispense Refill  . allopurinol (ZYLOPRIM) 100 MG tablet Take 100 mg by mouth daily.     Marland Kitchen aspirin EC 81 MG tablet Take 81 mg by mouth daily.    . Cholecalciferol (VITAMIN D) 1000 UNITS capsule Take 1,000 Units by mouth daily.      . clonazePAM (KLONOPIN) 1 MG tablet Take 2 mg by mouth at bedtime.     Marland Kitchen estrogens, conjugated, (PREMARIN) 1.25 MG tablet Take 1.25 mg by mouth daily.    Marland Kitchen ezetimibe (ZETIA) 10 MG tablet Take 10 mg by mouth daily.    . ferrous sulfate 325 (65 FE) MG tablet Take 325 mg by mouth daily  with breakfast.      . furosemide (LASIX) 20 MG tablet Take 1.5 tablets (30 mg total) by mouth daily. 45 tablet 5  . HYDROcodone-acetaminophen (NORCO) 5-325 MG per tablet Take 1 tablet by mouth every 4 (four) hours as needed for severe pain. 6 tablet 0  . metoprolol succinate (TOPROL-XL) 50 MG 24 hr tablet Take 1 tablet (50 mg total) by mouth daily. 90 tablet 3  . nitroGLYCERIN (NITROSTAT) 0.4 MG SL tablet Place 0.4 mg under the tongue every 5 (five) minutes as needed for chest pain.    . pantoprazole (PROTONIX) 40 MG tablet Take 40 mg by mouth 2 (two) times daily.     . potassium chloride (KLOR-CON) 20 MEQ packet Take 20 mEq by mouth daily. Take 1 Tablet By Mouth On Mondays, Wednesdays & Fridays.    Marland Kitchen PRESCRIPTION MEDICATION Apply 1 application topically daily as needed (itching). Compounded cream:  Ketoconazole 2% cream/ fluticasone 0.05% cream 1:1    . ramipril (ALTACE) 2.5 MG capsule Take 2.5 mg by mouth daily.    Marland Kitchen zolpidem (AMBIEN) 10 MG tablet Take 10 mg by mouth at bedtime.     Marland Kitchen HYDROcodone-acetaminophen (NORCO/VICODIN) 5-325 MG per tablet Take 1 tablet by mouth every 4 (four) hours as needed for moderate pain or severe pain (pain).     . [DISCONTINUED] mirtazapine (REMERON) 15 MG tablet Take 15 mg by mouth as needed.      . [  DISCONTINUED] ranitidine (ZANTAC) 300 MG tablet Take 300 mg by mouth 2 (two) times daily.       No current facility-administered medications for this visit.    Past Medical History  Diagnosis Date  . PVD (peripheral vascular disease)     status post prior femoral-popliteal bypass  . Back pain   . Arthritis   . CAD (coronary artery disease)     status post prior stenting to the proximal RCA, status post anterior STEMI 10/2008 (RCA occluded at the prior stent, LAD treated with a bare metal stent and a drug-eluting stent),  . Chronic systolic heart failure     echo 11/2011: EF 25-30%, AS and apical AK, trivial AI, mild MR, PASP 34.  . Anemia   . Ischemic  cardiomyopathy   . AICD (automatic cardioverter/defibrillator) present   . HLD (hyperlipidemia)   . GERD (gastroesophageal reflux disease)   . RLS (restless legs syndrome)   . Depression   . Tobacco abuse   . Pain in joint, lower leg   . Depressive disorder, not elsewhere classified   . Acute myocardial infarction of other anterior wall, subsequent episode of care 2010  . CHF (congestive heart failure)   . COPD (chronic obstructive pulmonary disease)   . Stroke   . DVT (deep venous thrombosis)   . Chronic kidney disease   . Cancer     Skin  of Left  Face  . Mixed hyperlipidemia   . Gout   . Atherosclerosis of native arteries of the extremities with intermittent claudication   . Osteoarthritis   . Eczema   . Fall at home July 5,  and  Oct.  2015    Social History History  Substance Use Topics  . Smoking status: Current Every Day Smoker -- 1.00 packs/day for 55 years    Types: Cigarettes  . Smokeless tobacco: Never Used  . Alcohol Use: No    Family History Family History  Problem Relation Age of Onset  . Peripheral vascular disease Sister   . AAA (abdominal aortic aneurysm) Sister   . Diabetes Mother   . Varicose Veins Mother   . Peripheral vascular disease Mother   . Hypertension Mother   . Heart attack Father     Massive Heart Attack  . CAD Father     Surgical History Past Surgical History  Procedure Laterality Date  . Psychologist, forensic    . Foot surgery    . Pr vein bypass graft,aorto-fem-pop      fem fem  04/07/09  . Abdominal hysterectomy    . Cardiac defibrillator placement    . Eye surgery  2013    Cataract bilateral   . Axillary-femoral bypass graft Right 06/29/2013    Procedure: BYPASS GRAFT AXILLA-RIGHT FEMORAL;  Surgeon: Rosetta Posner, MD;  Location: [facility];  Service: Vascular;  Laterality: Right;  . Femoral-tibial bypass graft  2002  . Femoral-femoral bypass graft  03/2009    S/P left to right fem-fem bypass graft    Allergies  Allergen Reactions  .  Morphine And Related Other (See Comments)    hallucinations  . Gabapentin Other (See Comments)    300 mg upsets her stomach and 100 mg affect her sleep  . Naproxen Other (See Comments)    Blurred vision  . Prednisone Other (See Comments)    cranky    Current Outpatient Prescriptions  Medication Sig Dispense Refill  . allopurinol (ZYLOPRIM) 100 MG tablet Take 100 mg by mouth daily.     Marland Kitchen  aspirin EC 81 MG tablet Take 81 mg by mouth daily.    . Cholecalciferol (VITAMIN D) 1000 UNITS capsule Take 1,000 Units by mouth daily.      . clonazePAM (KLONOPIN) 1 MG tablet Take 2 mg by mouth at bedtime.     Marland Kitchen estrogens, conjugated, (PREMARIN) 1.25 MG tablet Take 1.25 mg by mouth daily.    Marland Kitchen ezetimibe (ZETIA) 10 MG tablet Take 10 mg by mouth daily.    . ferrous sulfate 325 (65 FE) MG tablet Take 325 mg by mouth daily with breakfast.      . furosemide (LASIX) 20 MG tablet Take 1.5 tablets (30 mg total) by mouth daily. 45 tablet 5  . HYDROcodone-acetaminophen (NORCO) 5-325 MG per tablet Take 1 tablet by mouth every 4 (four) hours as needed for severe pain. 6 tablet 0  . metoprolol succinate (TOPROL-XL) 50 MG 24 hr tablet Take 1 tablet (50 mg total) by mouth daily. 90 tablet 3  . nitroGLYCERIN (NITROSTAT) 0.4 MG SL tablet Place 0.4 mg under the tongue every 5 (five) minutes as needed for chest pain.    . pantoprazole (PROTONIX) 40 MG tablet Take 40 mg by mouth 2 (two) times daily.     . potassium chloride (KLOR-CON) 20 MEQ packet Take 20 mEq by mouth daily. Take 1 Tablet By Mouth On Mondays, Wednesdays & Fridays.    Marland Kitchen PRESCRIPTION MEDICATION Apply 1 application topically daily as needed (itching). Compounded cream:  Ketoconazole 2% cream/ fluticasone 0.05% cream 1:1    . ramipril (ALTACE) 2.5 MG capsule Take 2.5 mg by mouth daily.    Marland Kitchen zolpidem (AMBIEN) 10 MG tablet Take 10 mg by mouth at bedtime.     Marland Kitchen HYDROcodone-acetaminophen (NORCO/VICODIN) 5-325 MG per tablet Take 1 tablet by mouth every 4 (four)  hours as needed for moderate pain or severe pain (pain).     . [DISCONTINUED] mirtazapine (REMERON) 15 MG tablet Take 15 mg by mouth as needed.      . [DISCONTINUED] ranitidine (ZANTAC) 300 MG tablet Take 300 mg by mouth 2 (two) times daily.       No current facility-administered medications for this visit.     REVIEW OF SYSTEMS: See HPI for pertinent positives and negatives.  Physical Examination Filed Vitals:   07/21/14 1116 07/21/14 1120  BP: 99/74 118/78  Pulse: 62 62  Resp:  14  Height:  5\' 3"  (1.6 m)  Weight:  120 lb (54.432 kg)  SpO2:  99%   Body mass index is 21.26 kg/(m^2).  General: A&O x 3, WDW. Gait: normal Eyes: PERRLA. Pulmonary: CTAB, without wheezes , rales or rhonchi, chronic moist cough. Cardiac: regular Rythm , with detected murmur.     Carotid Bruits Left Right   Negative Negative  Aorta is palpable. Radial pulses: 1+ right, not palpable left, left brachial is 1+ palpable.   VASCULAR EXAM: Extremities without ischemic changes  without Gangrene; without open wounds. Several abrasions including both knees, multiple bruises on legs, feet cool.     LE Pulses LEFT RIGHT   FEMORAL 2+ palpable 2+ palpable    POPLITEAL not palpable  not palpable   POSTERIOR TIBIAL not palpable  not palpable    DORSALIS PEDIS  ANTERIOR TIBIAL not palpable  not palpable    Abdomen: soft, NT, no palpated masses. Skin: many purpuric areas on forearms and backs of hands, no ulcers noted. Musculoskeletal: no muscle wasting or atrophy. Neurologic: A&O X 3; Appropriate Affect ; SENSATION: normal; MOTOR FUNCTION:  moving all extremities equally, motor strength 5/5 in arms, 3/5 in legs. Speech is fluent/normal. CN 2-12 intact.   Non-Invasive Vascular  Imaging (07/21/2014):  CEREBROVASCULAR DUPLEX EVALUATION    INDICATION: Carotid stenosis    PREVIOUS INTERVENTION(S): History of TIA 2007    DUPLEX EXAM:     RIGHT  LEFT  Peak Systolic Velocities (cm/s) End Diastolic Velocities (cm/s) Plaque LOCATION Peak Systolic Velocities (cm/s) End Diastolic Velocities (cm/s) Plaque  57 12 HT CCA PROXIMAL 50 15   67 17  CCA MID 57 17   74 22 HT CCA DISTAL 55 16 HT  80 14 HT ECA 60 11 HT  75 16 HT ICA PROXIMAL 73 26 HT  68 18  ICA MID 95 36   84 28  ICA DISTAL 99 33     1.12 ICA / CCA Ratio (PSV) 1.28  Antegrade Vertebral Flow Antegrade  371 Brachial Systolic Pressure (mmHg) 062  Triphasic Brachial Artery Waveforms Triphasic    Plaque Morphology:  HM = Homogeneous, HT = Heterogeneous, CP = Calcific Plaque, SP = Smooth Plaque, IP = Irregular Plaque     ADDITIONAL FINDINGS:     IMPRESSION: Bilateral internal carotid artery stenosis present in the less than 40% range.    Compared to the previous exam:  No previous study to compare.      ASSESSMENT:  [name] is a 68 y.o. female who is s/p left superficial femoral to posterior tibial artery bypass graft on 10/07/00, right femoral to below knee popliteal artery bypass graft on 04/23/05, femoral to femoral bypass graft on 04/07/09, right axillary to femoral bypass graft on 06/29/13. Bilateral ABI's remain normal with biphasic waveforms, bilateral TBI's are abnormal. Carotid Duplex today reveals minimal bilateral ICA stenosis. Unfortunately she continued to smoke.  PLAN:   The patient was counseled re smoking cessation and given several free resources re smoking cessation.  Based on today's exam and non-invasive vascular lab results, the patient will follow up in 1 year with the following tests ABI's and carotid Duplex. I discussed in depth with the patient the nature of atherosclerosis, and emphasized the importance of maximal medical management including strict control of blood  pressure, blood glucose, and lipid levels, obtaining regular exercise, and cessation of smoking.  The patient is aware that without maximal medical management the underlying atherosclerotic disease process will progress, limiting the benefit of any interventions.  The patient was given information about stroke prevention and what symptoms should prompt the patient to seek immediate medical care.  The patient was given information about PAD including signs, symptoms, treatment, what symptoms should prompt the patient to seek immediate medical care, and risk reduction measures to take. Thank you for allowing [place] to participate in this patient's care.  Clemon Chambers, RN, MSN, FNP-C [facility] Office: [phone]  Clinic MD: Scot Dock 07/21/2014 11:16 AM

## 2014-07-21 NOTE — Addendum Note (Signed)
Addended by: Mena Goes on: 07/21/2014 02:15 PM   Modules accepted: Orders

## 2014-07-22 ENCOUNTER — Encounter (HOSPITAL_BASED_OUTPATIENT_CLINIC_OR_DEPARTMENT_OTHER): Payer: Commercial Managed Care - HMO | Attending: Internal Medicine

## 2014-07-22 DIAGNOSIS — S41102D Unspecified open wound of left upper arm, subsequent encounter: Secondary | ICD-10-CM | POA: Insufficient documentation

## 2014-07-22 DIAGNOSIS — S81802D Unspecified open wound, left lower leg, subsequent encounter: Secondary | ICD-10-CM | POA: Diagnosis not present

## 2014-07-22 DIAGNOSIS — S81002D Unspecified open wound, left knee, subsequent encounter: Secondary | ICD-10-CM | POA: Insufficient documentation

## 2014-07-25 NOTE — Progress Notes (Signed)
[facility]  NAME:  [name], [name]                     ACCOUNT NO.:  MEDICAL RECORD NO.:  [id number]      DATE OF BIRTH:  11/05/1945  PHYSICIAN:  Ricard Dillon, M.D. VISIT DATE:  07/22/2014                                  OFFICE VISIT   CHIEF COMPLAINT:  The patient is here for our review of wounds on her right knee and left anterior leg after a fall 2 weeks ago.  HISTORY OF PRESENT ILLNESS:  This is a pleasant 68 year old woman with a history of severe chronic peripheral vascular disease, ongoing smoking, ischemic cardiomyopathy with congestive heart failure status post ICD implantation.  Apparently, she also has a significant number of falls, the etiology of which is not totally clear.  She fell 2 weeks ago traumatizing the anterior part of her right knee just below the left knee.  The fall was apparently while getting out of a chair on to the carpet.  She has been applying Neosporin to these areas.  PAST MEDICAL HISTORY: 1. Severe PVD status post prior femoral-popliteal bypass. 2. Back pain. 3. Coronary artery disease. 4. Chronic systolic heart failure with an ejection fraction of 20% to     25%. 5. Anemia. 6. Status post AICD implantation. 7. Hyperlipidemia. 8. Gastroesophageal reflux disease. 9. Restless legs syndrome. 10.Depression with anxiety. 11.Continued tobacco use. 12.Anterior myocardial infarction in 2010. 13.COPD. 14.History of CVA. 15.DVT. 16.Chronic renal failure. 17.Skin cancer of the left face. 18.Gout.  CURRENT MEDICATIONS: 1. Allopurinol 100 daily. 2. ASA 81 daily. 3. Vitamin D 1000 units daily. 4. Clonazepam 1 mg nightly. 5. Premarin 1.25 daily. 6. Zetia 10 daily. 7. Ferrous sulfate 325 daily. 8. Lasix 30 daily. 9. Norco 5/325 q.4 p.r.n. 10.Metoprolol 50 daily. 11.Protonix 40 b.i.d. 12.Klor-Con 20 daily. 13.Altace 2.5 daily. 14.Ambien 10 mg at bedtime.  PHYSICAL EXAMINATION:  VITAL SIGNS:  Temperature is 97.7,  pulse 61, respirations 16, blood pressure 100/63.  The areas of concern were on the right anterior knee over the patella and just below the patella on the left.  The area on the right was covered by eschar.  The wounds on the left were open and healthy appearing.  The entire area on the right wound was debrided with a #15 scalpel removing all the adherent eschar.  Over a large part of this area, there was underlying healthy skin, only a few small areas remained.  Vascular assessment was done actually yesterday at [facility].  She had a faint peripheral pulse on the left, I did not feel them in the right foot.  IMPRESSION/PLAN:  Traumatic wounds in the setting of severe peripheral artery disease.  Wounds were debrided as noted, we applied collagen, Hydrogel, with a leave-in, which can be changed 2 times at home, and we will see her again in a week's time.  I do not see anything that should preclude healing these areas at this point.          ______________________________ Ricard Dillon, M.D.     MGR/MEDQ  D:  07/22/2014  T:  07/22/2014  Job:  [id number]

## 2014-07-28 DIAGNOSIS — S81002D Unspecified open wound, left knee, subsequent encounter: Secondary | ICD-10-CM | POA: Diagnosis not present

## 2014-07-28 DIAGNOSIS — S41102D Unspecified open wound of left upper arm, subsequent encounter: Secondary | ICD-10-CM | POA: Diagnosis not present

## 2014-07-28 DIAGNOSIS — S81802D Unspecified open wound, left lower leg, subsequent encounter: Secondary | ICD-10-CM | POA: Diagnosis not present

## 2014-08-04 DIAGNOSIS — S81802D Unspecified open wound, left lower leg, subsequent encounter: Secondary | ICD-10-CM | POA: Diagnosis not present

## 2014-08-04 DIAGNOSIS — S41102D Unspecified open wound of left upper arm, subsequent encounter: Secondary | ICD-10-CM | POA: Diagnosis not present

## 2014-08-04 DIAGNOSIS — S81002D Unspecified open wound, left knee, subsequent encounter: Secondary | ICD-10-CM | POA: Diagnosis not present

## 2014-08-11 DIAGNOSIS — S81802D Unspecified open wound, left lower leg, subsequent encounter: Secondary | ICD-10-CM | POA: Diagnosis not present

## 2014-08-11 DIAGNOSIS — S81002D Unspecified open wound, left knee, subsequent encounter: Secondary | ICD-10-CM | POA: Diagnosis not present

## 2014-08-11 DIAGNOSIS — S41102D Unspecified open wound of left upper arm, subsequent encounter: Secondary | ICD-10-CM | POA: Diagnosis not present

## 2014-08-18 ENCOUNTER — Encounter (HOSPITAL_BASED_OUTPATIENT_CLINIC_OR_DEPARTMENT_OTHER): Payer: Commercial Managed Care - HMO | Attending: General Surgery

## 2014-08-18 ENCOUNTER — Other Ambulatory Visit: Payer: Self-pay

## 2014-08-18 DIAGNOSIS — Y999 Unspecified external cause status: Secondary | ICD-10-CM | POA: Diagnosis not present

## 2014-08-18 DIAGNOSIS — S81809A Unspecified open wound, unspecified lower leg, initial encounter: Secondary | ICD-10-CM | POA: Insufficient documentation

## 2014-08-18 DIAGNOSIS — X58XXXA Exposure to other specified factors, initial encounter: Secondary | ICD-10-CM | POA: Diagnosis not present

## 2014-08-18 DIAGNOSIS — Y929 Unspecified place or not applicable: Secondary | ICD-10-CM | POA: Insufficient documentation

## 2014-08-18 DIAGNOSIS — Y939 Activity, unspecified: Secondary | ICD-10-CM | POA: Insufficient documentation

## 2014-08-19 ENCOUNTER — Encounter: Payer: Commercial Managed Care - HMO | Admitting: Neurology

## 2014-08-21 ENCOUNTER — Other Ambulatory Visit: Payer: Self-pay | Admitting: Internal Medicine

## 2014-08-23 ENCOUNTER — Encounter: Payer: Self-pay | Admitting: *Deleted

## 2014-08-23 ENCOUNTER — Telehealth: Payer: Self-pay | Admitting: Neurology

## 2014-08-23 NOTE — Progress Notes (Unsigned)
No show letter was sent for 08/19/2014 and Dr. Kenton Kingfisher office was notified [name])

## 2014-08-23 NOTE — Telephone Encounter (Signed)
Pt no showed 08/19/14 EMG appt w/ Dr. Posey Pronto.  Danae Chen - please send no show letter to patient and notify Dr. Kenton Kingfisher (document who you spoke w/ please) / / Gayleen Orem.

## 2014-08-25 DIAGNOSIS — S81809A Unspecified open wound, unspecified lower leg, initial encounter: Secondary | ICD-10-CM | POA: Diagnosis not present

## 2014-08-26 ENCOUNTER — Encounter (HOSPITAL_COMMUNITY): Payer: Self-pay | Admitting: Vascular Surgery

## 2014-08-30 ENCOUNTER — Ambulatory Visit (INDEPENDENT_AMBULATORY_CARE_PROVIDER_SITE_OTHER): Payer: Commercial Managed Care - HMO | Admitting: *Deleted

## 2014-08-30 ENCOUNTER — Encounter: Payer: Self-pay | Admitting: Internal Medicine

## 2014-08-30 DIAGNOSIS — I429 Cardiomyopathy, unspecified: Secondary | ICD-10-CM

## 2014-08-30 DIAGNOSIS — I255 Ischemic cardiomyopathy: Secondary | ICD-10-CM

## 2014-08-30 DIAGNOSIS — I5022 Chronic systolic (congestive) heart failure: Secondary | ICD-10-CM

## 2014-08-30 NOTE — Progress Notes (Signed)
Remote ICD transmission.   

## 2014-08-31 LAB — MDC_IDC_ENUM_SESS_TYPE_REMOTE
Battery Remaining Longevity: 68 mo
Date Time Interrogation Session: 20151214070014
HighPow Impedance: 52 Ohm
Implantable Pulse Generator Serial Number: 726698
Lead Channel Pacing Threshold Amplitude: 1 V
Lead Channel Sensing Intrinsic Amplitude: 12 mV
Lead Channel Setting Pacing Pulse Width: 0.4 ms
MDC IDC MSMT BATTERY REMAINING PERCENTAGE: 58 %
MDC IDC MSMT BATTERY VOLTAGE: 2.96 V
MDC IDC MSMT LEADCHNL RV IMPEDANCE VALUE: 310 Ohm
MDC IDC MSMT LEADCHNL RV PACING THRESHOLD PULSEWIDTH: 0.4 ms
MDC IDC SET LEADCHNL RV PACING AMPLITUDE: 2.5 V
MDC IDC SET LEADCHNL RV SENSING SENSITIVITY: 0.5 mV
MDC IDC SET ZONE DETECTION INTERVAL: 280 ms
MDC IDC SET ZONE DETECTION INTERVAL: 335 ms
MDC IDC STAT BRADY RV PERCENT PACED: 1 %
Zone Setting Detection Interval: 250 ms

## 2014-09-01 DIAGNOSIS — S81809A Unspecified open wound, unspecified lower leg, initial encounter: Secondary | ICD-10-CM | POA: Diagnosis not present

## 2014-09-03 ENCOUNTER — Encounter: Payer: Self-pay | Admitting: Cardiology

## 2014-09-07 ENCOUNTER — Other Ambulatory Visit: Payer: Self-pay | Admitting: Internal Medicine

## 2014-09-15 DIAGNOSIS — S81809A Unspecified open wound, unspecified lower leg, initial encounter: Secondary | ICD-10-CM | POA: Diagnosis not present

## 2014-09-22 ENCOUNTER — Encounter (HOSPITAL_BASED_OUTPATIENT_CLINIC_OR_DEPARTMENT_OTHER): Payer: Commercial Managed Care - HMO | Attending: General Surgery

## 2014-09-22 DIAGNOSIS — S81802A Unspecified open wound, left lower leg, initial encounter: Secondary | ICD-10-CM | POA: Diagnosis not present

## 2014-09-22 DIAGNOSIS — X58XXXA Exposure to other specified factors, initial encounter: Secondary | ICD-10-CM | POA: Insufficient documentation

## 2014-10-11 ENCOUNTER — Ambulatory Visit (INDEPENDENT_AMBULATORY_CARE_PROVIDER_SITE_OTHER): Payer: Commercial Managed Care - HMO | Admitting: Neurology

## 2014-10-11 DIAGNOSIS — G609 Hereditary and idiopathic neuropathy, unspecified: Secondary | ICD-10-CM

## 2014-10-11 NOTE — Procedures (Signed)
[facility]  [place], [place]  [place], [place] [address] Tel: [phone] Fax:  [phone] Test Date:  10/11/2014  Patient: [name] DOB: 1946/06/30 Physician: Narda Amber, DO  Sex: Female Height: 5\' 3"  Ref Phys: Shirline Frees  ID#: [id number] Temp: 35.0C Technician: Laureen Ochs R. NCS T.   Patient Complaints: Patient is a 69 year-old female here with history of significant vaso-occlusive disease the lower extremities presenting for evaluation of bilateral leg paresthesias and weakness.   NCV & EMG Findings: Extensive electrodiagnostic testing of the right lower extremity and additional studies of the left shows:  1. Sural and superficial peroneal sensory responses are within normal limits bilaterally. 2. The right peroneal motor response is reduced in amplitude recording at both the extensor digitorum brevis and tibialis anterior. The left peroneal motor response recording at the tibialis anterior is also reduced in amplitude symmetrically and may be normal for patient given her small body habitus. Bilateral tibial motor responses are within normal limits. 3. Chronic motor axon loss changes are seen affecting the L5 myotomes bilaterally, without accompanied active denervation.  Impression: 1. Chronic L5 radiculopathy affecting bilateral lower extremities, mild in degree electrically. 2. There is no evidence of a generalized sensorimotor polyneuropathy or diffuse myopathy affecting the lower extremities.   ___________________________ Narda Amber, DO    Nerve Conduction Studies Anti Sensory Summary Table   Site NR Peak (ms) Norm Peak (ms) P-T Amp (V) Norm P-T Amp  Left Sup Peroneal Anti Sensory (Ant Lat Mall)  35C  12 cm    3.0 <4.6 6.4 >3  Right Sup Peroneal Anti Sensory (Ant Lat Mall)  35C  12 cm    3.1 <4.6 6.9 >3  Left Sural Anti Sensory (Lat Mall)  Calf    4.1 <4.6 10.0 >3  Right Sural Anti Sensory (Lat Mall)  35C  Calf    3.9 <4.6 11.1  >3   Motor Summary Table   Site NR Onset (ms) Norm Onset (ms) O-P Amp (mV) Norm O-P Amp Site1 Site2 Delta-0 (ms) Dist (cm) Vel (m/s) Norm Vel (m/s)  Left Peroneal Motor (Ext Dig Brev)  35C  Ankle    3.3 <6.0 2.8 >2.5 B Fib Ankle 7.7 31.0 40 >40  B Fib    11.0  2.6  Poplt B Fib 2.5 10.0 40 >40  Poplt    13.5  2.4         Right Peroneal Motor (Ext Dig Brev)  35C  Ankle    4.8 <6.0 1.9 >2.5 B Fib Ankle 7.7 33.0 43 >40  B Fib    12.5  1.8  Poplt B Fib 1.7 8.5 50 >40  Poplt    14.2  1.8         Left Peroneal TA Motor (Tib Ant)  35C  Fib Head    2.8 <4.5 2.3 >3 Poplit Fib Head 1.4 10.0 71 >40  Poplit    4.2  2.1         Right Peroneal TA Motor (Tib Ant)  35C  Fib Head    3.0 <4.5 2.8 >3 Poplit Fib Head 1.1 7.0 64 >40  Poplit    4.1  2.8         Left Tibial Motor (Abd Hall Brev)  35C  Ankle    3.7 <6.0 10.7 >4 Knee Ankle 8.8 35.0 40 >40  Knee    12.5  9.0         Right Tibial Motor (Abd  Hall Brev)  35C  Ankle    3.9 <6.0 13.0 >4 Knee Ankle 9.0 37.0 41 >40  Knee    12.9  8.7          F Wave Studies   NR F-Lat (ms) Lat Norm (ms) L-R F-Lat (ms)  Left Tibial (Mrkrs) (Abd Hallucis)  35C     49.83 <55    EMG   Side Muscle Ins Act Fibs Psw Fasc Number Recrt Dur Dur. Amp Amp. Poly Poly. Comment  Left AntTibialis Nml Nml Nml Nml 1- Mod-R Some 1+ Some 1+ Nml Nml N/A  Left Flex Dig Long Nml Nml Nml Nml 1- Mod-R Few 1+ Few 1+ Nml Nml N/A  Left GluteusMed Nml Nml Nml Nml 1- Mod-R Few 1+ Few 1+ Nml Nml N/A  Left RectFemoris Nml Nml Nml Nml Nml Nml Nml Nml Nml Nml Nml Nml N/A  Left Gastroc Nml Nml Nml Nml Nml Nml Nml Nml Nml Nml Nml Nml N/A  Right AntTibialis Nml Nml Nml Nml 1- Mod-R Some 1+ Some 1+ Nml Nml N/A  Right Flex Dig Long Nml Nml Nml Nml 1- Mod-R Few 1+ Few 1+ Nml Nml N/A  Right GluteusMed Nml Nml Nml Nml 1- Mod-R Few 1+ Few 1+ Nml Nml N/A  Right Gastroc Nml Nml Nml Nml Nml Nml Nml Nml Nml Nml Nml Nml N/A  Right RectFemoris Nml Nml Nml Nml Nml Nml Nml Nml Nml Nml Nml Nml N/A       Waveforms:

## 2014-10-20 ENCOUNTER — Other Ambulatory Visit: Payer: Self-pay | Admitting: Internal Medicine

## 2014-11-28 ENCOUNTER — Other Ambulatory Visit: Payer: Self-pay | Admitting: Internal Medicine

## 2014-12-01 ENCOUNTER — Ambulatory Visit (INDEPENDENT_AMBULATORY_CARE_PROVIDER_SITE_OTHER): Payer: Commercial Managed Care - HMO | Admitting: *Deleted

## 2014-12-01 DIAGNOSIS — I255 Ischemic cardiomyopathy: Secondary | ICD-10-CM

## 2014-12-01 DIAGNOSIS — I5022 Chronic systolic (congestive) heart failure: Secondary | ICD-10-CM

## 2014-12-01 LAB — MDC_IDC_ENUM_SESS_TYPE_REMOTE
Battery Voltage: 2.96 V
Brady Statistic RV Percent Paced: 1 %
HIGH POWER IMPEDANCE MEASURED VALUE: 49 Ohm
Implantable Pulse Generator Serial Number: 726698
Lead Channel Impedance Value: 330 Ohm
Lead Channel Pacing Threshold Amplitude: 1 V
Lead Channel Pacing Threshold Pulse Width: 0.4 ms
Lead Channel Sensing Intrinsic Amplitude: 12 mV
Lead Channel Setting Pacing Amplitude: 2.5 V
Lead Channel Setting Pacing Pulse Width: 0.4 ms
Lead Channel Setting Sensing Sensitivity: 0.5 mV
MDC IDC MSMT BATTERY REMAINING LONGEVITY: 66 mo
MDC IDC MSMT BATTERY REMAINING PERCENTAGE: 56 %
MDC IDC SESS DTM: 20160316060011
MDC IDC SET ZONE DETECTION INTERVAL: 250 ms
MDC IDC SET ZONE DETECTION INTERVAL: 280 ms
Zone Setting Detection Interval: 335 ms

## 2014-12-01 NOTE — Progress Notes (Signed)
Remote ICD transmission.   

## 2014-12-09 ENCOUNTER — Encounter: Payer: Self-pay | Admitting: Cardiology

## 2014-12-16 ENCOUNTER — Encounter: Payer: Self-pay | Admitting: Internal Medicine

## 2014-12-30 ENCOUNTER — Other Ambulatory Visit: Payer: Self-pay | Admitting: Internal Medicine

## 2015-02-09 ENCOUNTER — Other Ambulatory Visit: Payer: Self-pay | Admitting: Internal Medicine

## 2015-02-10 NOTE — Telephone Encounter (Signed)
Per note 9.11.15

## 2015-03-02 ENCOUNTER — Ambulatory Visit (INDEPENDENT_AMBULATORY_CARE_PROVIDER_SITE_OTHER): Payer: Commercial Managed Care - HMO | Admitting: *Deleted

## 2015-03-02 DIAGNOSIS — I5022 Chronic systolic (congestive) heart failure: Secondary | ICD-10-CM | POA: Diagnosis not present

## 2015-03-02 DIAGNOSIS — I255 Ischemic cardiomyopathy: Secondary | ICD-10-CM | POA: Diagnosis not present

## 2015-03-02 NOTE — Progress Notes (Signed)
Remote ICD transmission.   

## 2015-03-04 LAB — CUP PACEART REMOTE DEVICE CHECK
Brady Statistic RV Percent Paced: 1 %
HighPow Impedance: 49 Ohm
Lead Channel Impedance Value: 330 Ohm
Lead Channel Pacing Threshold Amplitude: 1 V
Lead Channel Pacing Threshold Pulse Width: 0.4 ms
Lead Channel Sensing Intrinsic Amplitude: 12 mV
Lead Channel Setting Pacing Pulse Width: 0.4 ms
MDC IDC MSMT BATTERY REMAINING LONGEVITY: 64 mo
MDC IDC MSMT BATTERY REMAINING PERCENTAGE: 54 %
MDC IDC MSMT BATTERY VOLTAGE: 2.95 V
MDC IDC SESS DTM: 20160615072712
MDC IDC SET LEADCHNL RV PACING AMPLITUDE: 2.5 V
MDC IDC SET LEADCHNL RV SENSING SENSITIVITY: 0.5 mV
MDC IDC SET ZONE DETECTION INTERVAL: 280 ms
Pulse Gen Serial Number: 726698
Zone Setting Detection Interval: 250 ms
Zone Setting Detection Interval: 335 ms

## 2015-03-09 ENCOUNTER — Encounter: Payer: Self-pay | Admitting: Cardiology

## 2015-03-14 ENCOUNTER — Encounter: Payer: Self-pay | Admitting: Internal Medicine

## 2015-03-28 ENCOUNTER — Other Ambulatory Visit: Payer: Self-pay | Admitting: Internal Medicine

## 2015-03-29 ENCOUNTER — Other Ambulatory Visit: Payer: Self-pay

## 2015-03-29 MED ORDER — POTASSIUM CHLORIDE CRYS ER 20 MEQ PO TBCR: EXTENDED_RELEASE_TABLET | ORAL | Status: DC

## 2015-05-06 NOTE — Progress Notes (Addendum)
Anesthesia Note: Patient is a 69 year old female scheduled for adjacent tissue transfer lip, possible Abbe flap on 05/10/15 by Dr. Iran Planas. 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 PAT is scheduled for 05/09/15 with anesthesia consult requested.  History includes smoking, CAD s/p proximal RCA stent with anterior STEMI 10/2008 s/p BMS and DES to LAD (RCA occluded at prior stent), ischemic cardiomyopathy, chronic systolic CHF, s/p St. Jude ICD 06/06/09, PVD s/p left SFT-PT BPG '02, right FPBG '06, and s/p right axillofemoral bypass 06/29/13. EP cardiologist is Dr. Cristopher Peru, last visit 05/28/14. Vascular surgeon is Dr. Tinnie Gens. PCP is Dr. Shirline Frees.  Meds include allopurinol, ASA, clonazepam, Premarin, Zetia, ferrous sulfate, Lasix, Norco, Toprol, Nitro, Protonix, KCl, ramipril, Ambien.  11/23/11 Echo: Study Conclusions - Left ventricle: The cavity size was moderately dilated. Wall thickness was normal. Systolic function was severely reduced. The estimated ejection fraction was in the range of 25% to 30%. Akinesis of the entireanteroseptal myocardium. Akinesis of the entireapical myocardium. There was an increased relative contribution of atrial contraction to ventricular filling. - Aortic valve: Trivial regurgitation. - Mitral valve: Mild regurgitation. - Pulmonary arteries: PA peak pressure: 27mm Hg (S).  10/20/08 Cardiac cath with abdominal aortic angiograophy (done for acute anterolateral, apical and inferoapical MI):  IMPRESSION: 1. 2V CAD. Codominant system. RCA occluded at its origin. LAD occluded proximally. 30% ostial LCX. 30% ostial OM1. PL branch reaches almost to the inferior septum if not actually perfusing the proximal portion of the inferior septum. There are left to rightcollaterals seen into the distal right coronary and extending almost tothe acute margin. 2. Severe ischemic cardiomyopathy. Anterolateral dyskinesis which was mild apical akinesis and  inferoapical akinesis.The anterobasal segment and the inferobasal segment were movingadequately.The calculated EF 23%. 3. Completely occluded RCA following a remote stent implantation. 4. 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 5. Severe diffuse atherosclerotic disease in the distal aorta, common liacs and into the femoral system bilaterally with complete occlusion of the right femoral artery prior to the bifurcation. INTERVENTION: Status post successful percutaneous coronary intervention with bare-metal stent implant in the proximal left anterior descending and drug-eluting stent in the distal left anterior descending.  05/28/14 EKG: SB at 58 bpm, possible LAE, LAD, LVH with QRS widening, T wave abnormality, consider inferior ischemia, T wave abnormality, consider anterolateral ischemia. T wave abnormalities present on EKGs dating back to at least 12/07/11. Inferior T wave abnormality has been both flat or negative on subsequent tracings.  07/31/14 Carotid duplex: < 40% BICA stenosis.  Last CXR in Epic 2014.   Labs pending her PAT appointment.  Discussed available information with anesthesiologist Dr. Jenita Seashore.  Plan to evaluate patient during her PAT visit to assess for any new CV/CHF symptoms or ICD discharges. If stable and labs are acceptable than can likely proceed as planned. Additional input on 05/09/15.  [facility] Short Stay Center/Anesthesiology Phone [phone] 05/06/2015 3:46 PM  Addendum: I evaluated patient at her PAT visit. Husband is at side. She apparently just underwent MOHS procedure on her bottom lip on Friday and were told this next procedure needs to occur before scar tissue begins to develop. Dr. Para Skeans not indicated that patient is opting for Karapandzic over an Abbe flap so it can be done with one surgery instead of two. Her mouth opening has been significantly limited since her MOHS procedure.  She had some difficultly with just removing her upper dentures. She denied history  of nasal fracture or epistaxis. She feels at baseline from a cardiopulmonary standpoint. No ICD discharges. Her weight has been stable. No SOB at rest. No orthopnea. She sleeps flat or on her stomach. No significant or new edema. She has had an intermittent dull 2/10 mid chest pain that may radiate right or left during periods of emotional stress (ie, death in the family) since even before her MI in 2010. They less about a minute and go away on their own. There is no associated SOB, nausea, jaw or arm pain, diaphoresis. She "can't remember the last time it occurred" but was a while ago. They are not associated with activity and are not the same type of pain with her MI.  In the case of her MI, she was shopping at [place], felt funny, "couldn't see anyone", became hot and diaphoretic and was brought to [facility] via EMS. There was no associated chest pain. Her walking is limited due to BLE weakness. She is using a hospital wheelchair today. Dr. Iran Planas spoke with her regarding the importance of smoking cessation to help with wound healing. She has been smoking 1PPD, but has only smoked one cigarette since Friday. I encouraged her to keep up her goal of complete smoking cessation. Her ASA is on hold for surgery.  Exam: BP 119/63 bpm, HR 64, RR 18, T 36.4 C, O2 sat 100%. She is a little drowsy (took narcotic this morning), but easily arousable and otherwise answers questions appropriately. Heart RRR, grade II/VI SEM heard beat on RSB. No significant BLE pretibial edema. She has a right sided lower lip scar, mild erythema but no drainage. Mouth opening is limited, around 2FB.   05/09/15 EKG: NSR, possible LAE, LAD, LVH with QRS widening, T wave abnormality, consider lateral ischemia. EKG appears stable.   Preoperative labs noted.   Above discussed with anesthesiologist Dr. Linna Caprice who also came to evaluate her due to limited mouth  opening. We could not find specifics on her intubation from her 2014 anesthesia record, but it appears that her limited mouth opening is from her recent MOHS procedure. I'll request her 04/23/05 anesthesia record from [facility]. Dr. Linna Caprice anticipated patient could have IV induction with probable use of glidescope. Definitive plan following anesthesiologist evaluation on the day of surgery.   [facility] Short Stay Center/Anesthesiology Phone [phone] 05/09/2015 11:31 AM

## 2015-05-06 NOTE — Pre-Procedure Instructions (Addendum)
    [name]  05/06/2015    Your procedure is scheduled on Tuesday, August 23  Report to [facility] Admitting at 8:00  A.M.  Call this number if you have problems the morning of surgery [phone]   Remember:  Do not eat food or drink liquids after midnight.  Take these medicines the morning of surgery with A SIP OF WATER : estrogens, conjugated, (PREMARIN), metoprolol succinate (TOPROL-XL).                  Take if needed: Nitroglycerin, Hydrocodone- Acetaminophen.   Do not wear jewelry, make-up or nail polish.  Do not wear lotions, powders, or perfumes.    Do not shave 48 hours prior to surgery.    Do not bring valuables to the hospital.  [facility] is not responsible for any belongings or valuables.  Contacts, dentures or bridgework may not be worn into surgery.  Leave your suitcase in the car.  After surgery it may be brought to your room.  For patients admitted to the hospital, discharge time will be determined by your treatment team.  Patients discharged the day of surgery will not be allowed to drive home.   Name and phone number of your driver:   -  Special instructions: Review  [place] - Preparing For Surgery.  Please read over the following fact sheets that you were given. Pain Booklet, Coughing and Deep Breathing and Surgical Site Infection Prevention

## 2015-05-06 NOTE — H&P (Signed)
  Subjective:    Patient ID: [name] is a 69 y.o. female.  HPI  Referred by Dr. Danielle Dess following Mohs resection SCC lip that has been present for a year, though it was a fever blister. This is first skin cancer diagnosis. No history ulceration or bleeding.  Active smoker and history MI, significant PVD with emergent axillary femoral bypass 2014. Last ECHO 2013 with EF 30-35%  Wears complete upper and lower dentures.  Review of Systems  Constitutional: Positive for fatigue.  HENT: Positive for hearing loss.  Eyes: Positive for visual disturbance.  Respiratory: Positive for shortness of breath.  Cardiovascular: Positive for leg swelling.  Musculoskeletal: Positive for back pain.  Neurological: Positive for weakness, light-headedness, numbness and headaches.       Objective:   Physical Exam  Constitutional: She is oriented to person, place, and time.  HENT:  Lip defect has been approximated with silk sutures, presently mouth opening 20 mm Edentulous Review of post mohs pictures, loss full thickness full height of right lower lip not involving commissure >1/2, unable to obtain measurements due to approximation  Cardiovascular: Regular rhythm and normal heart sounds.  Pulmonary/Chest: Effort normal and breath sounds normal.  Abdominal: Soft.  Neurological: She is alert and oriented to person, place, and time.       Assessment:     Mohs defect lip SCC lip     Plan:     Reviewed her continued smoking places her at high risk failure flaps. Size of defect places her at great risk microstomia. Reviewed that despite ability to primarily approximate lip, the function lip including ability to wear dentures, lower lip to provide oral competence will be severely limited without additional tissue transfer to add additional length. Discussed Abbe flap vs Karapandzic. Abbe might aesthetically provide better result but require two surgeries, have no real mouth opening for 2-3  weeks. Given her significant comorbidites, my preference would be single surgery with Karapandzic. This will have larger skin scar and produce rounding of commissure. They are in agreement to one surgery procedure. Reviewed post op do not recommend dentures for at least week to allow for healing.       Irene Limbo, MD [facility] Plastic & Reconstructive Surgery [phone]

## 2015-05-09 ENCOUNTER — Other Ambulatory Visit: Payer: Self-pay

## 2015-05-09 ENCOUNTER — Encounter (HOSPITAL_COMMUNITY): Payer: Self-pay

## 2015-05-09 ENCOUNTER — Encounter (HOSPITAL_COMMUNITY)
Admission: RE | Admit: 2015-05-09 | Discharge: 2015-05-09 | Disposition: A | Discharge status: Home / Self Care | Payer: Commercial Managed Care - HMO | Source: Ambulatory Visit | Attending: Specialist | Admitting: Specialist

## 2015-05-09 DIAGNOSIS — F17210 Nicotine dependence, cigarettes, uncomplicated: Secondary | ICD-10-CM | POA: Diagnosis not present

## 2015-05-09 DIAGNOSIS — I739 Peripheral vascular disease, unspecified: Secondary | ICD-10-CM | POA: Diagnosis not present

## 2015-05-09 DIAGNOSIS — Z95828 Presence of other vascular implants and grafts: Secondary | ICD-10-CM | POA: Diagnosis not present

## 2015-05-09 DIAGNOSIS — I252 Old myocardial infarction: Secondary | ICD-10-CM | POA: Diagnosis not present

## 2015-05-09 DIAGNOSIS — I10 Essential (primary) hypertension: Secondary | ICD-10-CM | POA: Diagnosis not present

## 2015-05-09 DIAGNOSIS — Z8673 Personal history of transient ischemic attack (TIA), and cerebral infarction without residual deficits: Secondary | ICD-10-CM | POA: Diagnosis not present

## 2015-05-09 DIAGNOSIS — Z955 Presence of coronary angioplasty implant and graft: Secondary | ICD-10-CM | POA: Diagnosis not present

## 2015-05-09 DIAGNOSIS — I255 Ischemic cardiomyopathy: Secondary | ICD-10-CM | POA: Diagnosis not present

## 2015-05-09 DIAGNOSIS — Z95810 Presence of automatic (implantable) cardiac defibrillator: Secondary | ICD-10-CM | POA: Diagnosis not present

## 2015-05-09 DIAGNOSIS — J449 Chronic obstructive pulmonary disease, unspecified: Secondary | ICD-10-CM | POA: Diagnosis not present

## 2015-05-09 DIAGNOSIS — C009 Malignant neoplasm of lip, unspecified: Secondary | ICD-10-CM | POA: Diagnosis not present

## 2015-05-09 DIAGNOSIS — I2510 Atherosclerotic heart disease of native coronary artery without angina pectoris: Secondary | ICD-10-CM | POA: Diagnosis not present

## 2015-05-09 DIAGNOSIS — K219 Gastro-esophageal reflux disease without esophagitis: Secondary | ICD-10-CM | POA: Diagnosis not present

## 2015-05-09 DIAGNOSIS — M1990 Unspecified osteoarthritis, unspecified site: Secondary | ICD-10-CM | POA: Diagnosis not present

## 2015-05-09 HISTORY — DX: Angina pectoris, unspecified: I20.9

## 2015-05-09 HISTORY — DX: Reserved for inherently not codable concepts without codable children: IMO0001

## 2015-05-09 HISTORY — DX: Essential (primary) hypertension: I10

## 2015-05-09 HISTORY — DX: Hereditary and idiopathic neuropathy, unspecified: G60.9

## 2015-05-09 LAB — CBC WITH DIFFERENTIAL/PLATELET
Basophils Absolute: 0.1 10*3/uL (ref 0.0–0.1)
Basophils Relative: 1 % (ref 0–1)
Eosinophils Absolute: 0.1 10*3/uL (ref 0.0–0.7)
Eosinophils Relative: 1 % (ref 0–5)
HCT: 38.2 % (ref 36.0–46.0)
HEMOGLOBIN: 12.2 g/dL (ref 12.0–15.0)
LYMPHS ABS: 1.9 10*3/uL (ref 0.7–4.0)
LYMPHS PCT: 19 % (ref 12–46)
MCH: 32.4 pg (ref 26.0–34.0)
MCHC: 31.9 g/dL (ref 30.0–36.0)
MCV: 101.6 fL — AB (ref 78.0–100.0)
MONOS PCT: 7 % (ref 3–12)
Monocytes Absolute: 0.7 10*3/uL (ref 0.1–1.0)
NEUTROS PCT: 72 % (ref 43–77)
Neutro Abs: 7 10*3/uL (ref 1.7–7.7)
Platelets: 136 10*3/uL — ABNORMAL LOW (ref 150–400)
RBC: 3.76 MIL/uL — AB (ref 3.87–5.11)
RDW: 15.4 % (ref 11.5–15.5)
WBC: 9.8 10*3/uL (ref 4.0–10.5)

## 2015-05-09 LAB — BASIC METABOLIC PANEL
Anion gap: 9 (ref 5–15)
BUN: 17 mg/dL (ref 6–20)
CHLORIDE: 104 mmol/L (ref 101–111)
CO2: 25 mmol/L (ref 22–32)
CREATININE: 1.26 mg/dL — AB (ref 0.44–1.00)
Calcium: 9.5 mg/dL (ref 8.9–10.3)
GFR calc non Af Amer: 43 mL/min — ABNORMAL LOW (ref >60)
GFR, EST AFRICAN AMERICAN: 50 mL/min — AB (ref >60)
Glucose, Bld: 91 mg/dL (ref 65–99)
POTASSIUM: 4.5 mmol/L (ref 3.5–5.1)
SODIUM: 138 mmol/L (ref 135–145)

## 2015-05-09 MED ORDER — CEFAZOLIN SODIUM-DEXTROSE 2-3 GM-% IV SOLR
2.0000 g | INTRAVENOUS | Status: AC
  Administered 2015-05-10: 2 g via INTRAVENOUS
  Filled 2015-05-09: qty 50

## 2015-05-09 NOTE — Pre-Procedure Instructions (Signed)
    [name]  05/09/2015    Your procedure is scheduled on Tuesday, August 23  Report to [facility] Admitting at 8:00  A.M.  Call this number if you have problems the morning of surgery [phone]   Remember:  Do not eat food or drink liquids after midnight.  Take these medicines the morning of surgery with A SIP OF WATER : estrogens, conjugated, (PREMARIN), metoprolol succinate (TOPROL-XL).                  Take if needed: Nitroglycerin, Hydrocodone- Acetaminophen.   Do not wear jewelry, make-up or nail polish.  Do not wear lotions, powders, or perfumes.    Do not shave 48 hours prior to surgery.    Do not bring valuables to the hospital.  [facility] is not responsible for any belongings or valuables.  Contacts, dentures or bridgework may not be worn into surgery.  Leave your suitcase in the car.  After surgery it may be brought to your room.  For patients admitted to the hospital, discharge time will be determined by your treatment team.  Patients discharged the day of surgery will not be allowed to drive home.   Name and phone number of your driver:   -  Special instructions: Review  [place] - Preparing For Surgery.  Please read over the following fact sheets that you were given. Pain Booklet, Coughing and Deep Breathing and Surgical Site Infection Prevention

## 2015-05-09 NOTE — Progress Notes (Signed)
[name] reports that she has chest pains at times- "I can be just setting in the chair."  Patient rates pain 2/10.  "sharp pain, starts on left side, radiates to the right."  Denies shortness of breath or light headedness at that time.  Sates she has had this pain on and off since beforw she had MI (2010).

## 2015-05-10 ENCOUNTER — Ambulatory Visit (HOSPITAL_COMMUNITY): Payer: Commercial Managed Care - HMO | Admitting: Certified Registered"

## 2015-05-10 ENCOUNTER — Encounter (HOSPITAL_COMMUNITY)
Admission: RE | Disposition: A | Discharge status: Home / Self Care | Payer: Self-pay | Source: Ambulatory Visit | Attending: Plastic Surgery

## 2015-05-10 ENCOUNTER — Ambulatory Visit (HOSPITAL_COMMUNITY)
Admission: RE | Admit: 2015-05-10 | Discharge: 2015-05-10 | Disposition: A | Discharge status: Home / Self Care | Payer: Commercial Managed Care - HMO | Source: Ambulatory Visit | Attending: Plastic Surgery | Admitting: Plastic Surgery

## 2015-05-10 ENCOUNTER — Encounter (HOSPITAL_COMMUNITY): Payer: Self-pay | Admitting: *Deleted

## 2015-05-10 ENCOUNTER — Ambulatory Visit (HOSPITAL_COMMUNITY): Payer: Commercial Managed Care - HMO | Admitting: Vascular Surgery

## 2015-05-10 DIAGNOSIS — Z95828 Presence of other vascular implants and grafts: Secondary | ICD-10-CM | POA: Insufficient documentation

## 2015-05-10 DIAGNOSIS — F17210 Nicotine dependence, cigarettes, uncomplicated: Secondary | ICD-10-CM | POA: Insufficient documentation

## 2015-05-10 DIAGNOSIS — K219 Gastro-esophageal reflux disease without esophagitis: Secondary | ICD-10-CM | POA: Insufficient documentation

## 2015-05-10 DIAGNOSIS — I2510 Atherosclerotic heart disease of native coronary artery without angina pectoris: Secondary | ICD-10-CM | POA: Insufficient documentation

## 2015-05-10 DIAGNOSIS — C009 Malignant neoplasm of lip, unspecified: Secondary | ICD-10-CM | POA: Diagnosis not present

## 2015-05-10 DIAGNOSIS — J449 Chronic obstructive pulmonary disease, unspecified: Secondary | ICD-10-CM | POA: Insufficient documentation

## 2015-05-10 DIAGNOSIS — Z955 Presence of coronary angioplasty implant and graft: Secondary | ICD-10-CM | POA: Insufficient documentation

## 2015-05-10 DIAGNOSIS — I255 Ischemic cardiomyopathy: Secondary | ICD-10-CM | POA: Insufficient documentation

## 2015-05-10 DIAGNOSIS — I10 Essential (primary) hypertension: Secondary | ICD-10-CM | POA: Insufficient documentation

## 2015-05-10 DIAGNOSIS — I739 Peripheral vascular disease, unspecified: Secondary | ICD-10-CM | POA: Diagnosis not present

## 2015-05-10 DIAGNOSIS — Z95810 Presence of automatic (implantable) cardiac defibrillator: Secondary | ICD-10-CM | POA: Insufficient documentation

## 2015-05-10 DIAGNOSIS — I252 Old myocardial infarction: Secondary | ICD-10-CM | POA: Insufficient documentation

## 2015-05-10 DIAGNOSIS — M1990 Unspecified osteoarthritis, unspecified site: Secondary | ICD-10-CM | POA: Insufficient documentation

## 2015-05-10 DIAGNOSIS — Z8673 Personal history of transient ischemic attack (TIA), and cerebral infarction without residual deficits: Secondary | ICD-10-CM | POA: Insufficient documentation

## 2015-05-10 HISTORY — PX: CLEFT LIP REPAIR: SHX5315

## 2015-05-10 SURGERY — REPAIR, CLEFT LIP
Anesthesia: General | Site: Mouth

## 2015-05-10 MED ORDER — BACITRACIN ZINC 500 UNIT/GM EX OINT
TOPICAL_OINTMENT | CUTANEOUS | Status: AC
  Filled 2015-05-10: qty 28.35

## 2015-05-10 MED ORDER — LIDOCAINE HCL (CARDIAC) 20 MG/ML IV SOLN
INTRAVENOUS | Status: DC | PRN
  Administered 2015-05-10: 60 mg via INTRAVENOUS

## 2015-05-10 MED ORDER — GLYCOPYRROLATE 0.2 MG/ML IJ SOLN
INTRAMUSCULAR | Status: DC | PRN
  Administered 2015-05-10: 0.4 mg via INTRAVENOUS

## 2015-05-10 MED ORDER — LACTATED RINGERS IV SOLN
INTRAVENOUS | Status: DC
  Administered 2015-05-10: 08:00:00 via INTRAVENOUS

## 2015-05-10 MED ORDER — LIDOCAINE-EPINEPHRINE 1 %-1:100000 IJ SOLN
INTRAMUSCULAR | Status: AC
  Filled 2015-05-10: qty 1

## 2015-05-10 MED ORDER — FENTANYL CITRATE (PF) 100 MCG/2ML IJ SOLN
INTRAMUSCULAR | Status: DC | PRN
  Administered 2015-05-10: 50 ug via INTRAVENOUS
  Administered 2015-05-10: 100 ug via INTRAVENOUS
  Administered 2015-05-10 (×2): 50 ug via INTRAVENOUS

## 2015-05-10 MED ORDER — FENTANYL CITRATE (PF) 250 MCG/5ML IJ SOLN
INTRAMUSCULAR | Status: AC
  Filled 2015-05-10: qty 5

## 2015-05-10 MED ORDER — FENTANYL CITRATE (PF) 100 MCG/2ML IJ SOLN: 25.0000 ug | INTRAMUSCULAR | Status: DC | PRN

## 2015-05-10 MED ORDER — SUCCINYLCHOLINE CHLORIDE 20 MG/ML IJ SOLN
INTRAMUSCULAR | Status: DC | PRN
  Administered 2015-05-10: 100 mg via INTRAVENOUS

## 2015-05-10 MED ORDER — LIDOCAINE HCL (CARDIAC) 20 MG/ML IV SOLN
INTRAVENOUS | Status: AC
  Filled 2015-05-10: qty 5

## 2015-05-10 MED ORDER — METOPROLOL TARTRATE 1 MG/ML IV SOLN
INTRAVENOUS | Status: DC | PRN
  Administered 2015-05-10 (×4): 1 mg via INTRAVENOUS

## 2015-05-10 MED ORDER — LIDOCAINE-EPINEPHRINE 1 %-1:100000 IJ SOLN
INTRAMUSCULAR | Status: DC | PRN
  Administered 2015-05-10: 20 mL

## 2015-05-10 MED ORDER — ONDANSETRON HCL 4 MG/2ML IJ SOLN
INTRAMUSCULAR | Status: DC | PRN
  Administered 2015-05-10: 4 mg via INTRAVENOUS

## 2015-05-10 MED ORDER — SUCCINYLCHOLINE CHLORIDE 20 MG/ML IJ SOLN
INTRAMUSCULAR | Status: AC
  Filled 2015-05-10: qty 1

## 2015-05-10 MED ORDER — OXYCODONE-ACETAMINOPHEN 5-325 MG PO TABS: 1.0000 | ORAL_TABLET | ORAL | Status: DC | PRN

## 2015-05-10 MED ORDER — ROCURONIUM BROMIDE 100 MG/10ML IV SOLN
INTRAVENOUS | Status: DC | PRN
  Administered 2015-05-10: 30 mg via INTRAVENOUS
  Administered 2015-05-10: 10 mg via INTRAVENOUS

## 2015-05-10 MED ORDER — MIDAZOLAM HCL 5 MG/5ML IJ SOLN
INTRAMUSCULAR | Status: DC | PRN
Start: 2015-05-10 — End: 2015-05-10
  Administered 2015-05-10: 2 mg via INTRAVENOUS

## 2015-05-10 MED ORDER — ONDANSETRON HCL 4 MG/2ML IJ SOLN
INTRAMUSCULAR | Status: AC
  Filled 2015-05-10: qty 2

## 2015-05-10 MED ORDER — PHENYLEPHRINE HCL 10 MG/ML IJ SOLN
10.0000 mg | INTRAMUSCULAR | Status: DC | PRN
  Administered 2015-05-10: 10 ug/min via INTRAVENOUS

## 2015-05-10 MED ORDER — ROCURONIUM BROMIDE 50 MG/5ML IV SOLN
INTRAVENOUS | Status: AC
  Filled 2015-05-10: qty 1

## 2015-05-10 MED ORDER — MIDAZOLAM HCL 2 MG/2ML IJ SOLN
INTRAMUSCULAR | Status: AC
  Filled 2015-05-10: qty 4

## 2015-05-10 MED ORDER — 0.9 % SODIUM CHLORIDE (POUR BTL) OPTIME
TOPICAL | Status: DC | PRN
  Administered 2015-05-10: 1000 mL

## 2015-05-10 MED ORDER — PROPOFOL 10 MG/ML IV BOLUS
INTRAVENOUS | Status: AC
  Filled 2015-05-10: qty 20

## 2015-05-10 MED ORDER — NEOSTIGMINE METHYLSULFATE 10 MG/10ML IV SOLN
INTRAVENOUS | Status: DC | PRN
  Administered 2015-05-10: 3 mg via INTRAVENOUS

## 2015-05-10 MED ORDER — ETOMIDATE 2 MG/ML IV SOLN
INTRAVENOUS | Status: DC | PRN
  Administered 2015-05-10: 8 mg via INTRAVENOUS

## 2015-05-10 MED ORDER — BACITRACIN ZINC 500 UNIT/GM EX OINT
TOPICAL_OINTMENT | CUTANEOUS | Status: DC | PRN
  Administered 2015-05-10: 1 via TOPICAL

## 2015-05-10 SURGICAL SUPPLY — 55 items
BALL CTTN LRG ABS STRL LF (GAUZE/BANDAGES/DRESSINGS)
BLADE CLIPPER SURG (BLADE) IMPLANT
BLADE SURG 15 STRL LF DISP TIS (BLADE) IMPLANT
BLADE SURG 15 STRL SS (BLADE)
CANISTER SUCTION 1200CC (MISCELLANEOUS) IMPLANT
CLOSURE WOUND 1/2 X4 (GAUZE/BANDAGES/DRESSINGS) ×1
CLOSURE WOUND 1/4X4 (GAUZE/BANDAGES/DRESSINGS)
COTTONBALL LRG STERILE PKG (GAUZE/BANDAGES/DRESSINGS) IMPLANT
COVER SURGICAL LIGHT HANDLE (MISCELLANEOUS) ×3 IMPLANT
CRADLE DONUT ADULT HEAD (MISCELLANEOUS) ×3 IMPLANT
DECANTER SPIKE VIAL GLASS SM (MISCELLANEOUS) ×3 IMPLANT
DRAPE ORTHO SPLIT 77X108 STRL (DRAPES)
DRAPE SURG 17X23 STRL (DRAPES) ×3 IMPLANT
DRAPE SURG ORHT 6 SPLT 77X108 (DRAPES) IMPLANT
ELECT COATED BLADE 2.86 ST (ELECTRODE) ×2 IMPLANT
ELECT NDL BLADE 2-5/6 (NEEDLE) IMPLANT
ELECT NDL TIP 2.8 STRL (NEEDLE) ×1 IMPLANT
ELECT NEEDLE BLADE 2-5/6 (NEEDLE) IMPLANT
ELECT NEEDLE TIP 2.8 STRL (NEEDLE) ×3 IMPLANT
ELECT REM PT RETURN 9FT ADLT (ELECTROSURGICAL) ×3
ELECTRODE REM PT RTRN 9FT ADLT (ELECTROSURGICAL) ×1 IMPLANT
GAUZE SPONGE 4X4 16PLY XRAY LF (GAUZE/BANDAGES/DRESSINGS) ×2 IMPLANT
GAUZE XEROFORM 5X9 LF (GAUZE/BANDAGES/DRESSINGS) IMPLANT
GLOVE BIO SURGEON STRL SZ 6 (GLOVE) ×3 IMPLANT
GLOVE BIO SURGEON STRL SZ7 (GLOVE) ×2 IMPLANT
GLOVE BIO SURGEON STRL SZ7.5 (GLOVE) ×2 IMPLANT
GLOVE BIOGEL PI IND STRL 7.0 (GLOVE) IMPLANT
GLOVE BIOGEL PI IND STRL 7.5 (GLOVE) IMPLANT
GLOVE BIOGEL PI INDICATOR 7.0 (GLOVE) ×4
GLOVE BIOGEL PI INDICATOR 7.5 (GLOVE) ×2
GOWN STRL REUS W/ TWL LRG LVL3 (GOWN DISPOSABLE) ×2 IMPLANT
GOWN STRL REUS W/TWL LRG LVL3 (GOWN DISPOSABLE) ×6
KIT BASIN OR (CUSTOM PROCEDURE TRAY) ×3 IMPLANT
MARKER SKIN DUAL TIP RULER LAB (MISCELLANEOUS) ×2 IMPLANT
NEEDLE 27GAX1X1/2 (NEEDLE) ×3 IMPLANT
NS IRRIG 1000ML POUR BTL (IV SOLUTION) ×3 IMPLANT
PATTIES SURGICAL .5 X3 (DISPOSABLE) IMPLANT
PEN SKIN MARKING BROAD (MISCELLANEOUS) ×3 IMPLANT
PENCIL BUTTON HOLSTER BLD 10FT (ELECTRODE) ×3 IMPLANT
SPONGE LAP 18X18 X RAY DECT (DISPOSABLE) IMPLANT
STAPLER VISISTAT 35W (STAPLE) ×3 IMPLANT
STRIP CLOSURE SKIN 1/2X4 (GAUZE/BANDAGES/DRESSINGS) ×2 IMPLANT
STRIP CLOSURE SKIN 1/4X4 (GAUZE/BANDAGES/DRESSINGS) IMPLANT
SUT 5.0 PDS RB-1 (SUTURE)
SUT CHROMIC 4 0 P 3 18 (SUTURE) ×6 IMPLANT
SUT PDS PLUS AB 5-0 RB-1 (SUTURE) IMPLANT
SUT PLAIN 5 0 P 3 18 (SUTURE) ×2 IMPLANT
SUT PROLENE 6 0 PC 1 (SUTURE) ×4 IMPLANT
SUT SILK 2 0 SH (SUTURE) ×2 IMPLANT
SUT VIC AB 4-0 PS2 27 (SUTURE) ×2 IMPLANT
SUT VIC AB 5-0 P-3 18XBRD (SUTURE) IMPLANT
SUT VIC AB 5-0 P3 18 (SUTURE) ×3
SYR BULB 3OZ (MISCELLANEOUS) ×3 IMPLANT
TOWEL OR 17X24 6PK STRL BLUE (TOWEL DISPOSABLE) ×6 IMPLANT
TRAY ENT MC OR (CUSTOM PROCEDURE TRAY) ×3 IMPLANT

## 2015-05-10 NOTE — Anesthesia Postprocedure Evaluation (Signed)
  Anesthesia Post-op Note  Patient: [name]  Procedure(s) Performed: Procedure(s): Karapandandzic flap to lower lip  (N/A)  Patient Location: PACU  Anesthesia Type:General  Level of Consciousness: awake and alert   Airway and Oxygen Therapy: Patient Spontanous Breathing  Post-op Pain: mild  Post-op Assessment: Post-op Vital signs reviewed and Patient's Cardiovascular Status Stable              Post-op Vital Signs: stable  Last Vitals:  Filed Vitals:   05/10/15 1400  BP:   Pulse: 100  Temp:   Resp: 16    Complications: No apparent anesthesia complications

## 2015-05-10 NOTE — Anesthesia Procedure Notes (Signed)
Procedure Name: Intubation Date/Time: 05/10/2015 10:15 AM Performed by: Lavell Luster Pre-anesthesia Checklist: Patient identified, Emergency Drugs available, Suction available, Patient being monitored and Timeout performed Patient Re-evaluated:Patient Re-evaluated prior to inductionOxygen Delivery Method: Circle system utilized Preoxygenation: Pre-oxygenation with 100% oxygen Intubation Type: IV induction Ventilation: Mask ventilation without difficulty Laryngoscope Size: Mac and 3 Grade View: Grade I Tube type: Oral Tube size: 7.0 mm Number of attempts: 1 Airway Equipment and Method: Stylet Placement Confirmation: ETT inserted through vocal cords under direct vision and positive ETCO2 Secured at: 22 cm Dental Injury: Teeth and Oropharynx as per pre-operative assessment  Comments: Easy atraumatic induction and intubation with MAC 3 blade.  Dr Deatra Canter verified placement.  Dr Iran Planas sutured ETT in place.  Cuffed flexible reinforced ETT used per surgeon request.  Henderson Cloud, CRNA

## 2015-05-10 NOTE — Anesthesia Preprocedure Evaluation (Addendum)
Anesthesia Evaluation  Patient identified by MRN, date of birth, ID band Patient awake    Reviewed: Allergy & Precautions, NPO status , Patient's Chart, lab work & pertinent test results  Airway Mallampati: III  TM Distance: >3 FB Neck ROM: Full    Dental  (+) Edentulous Upper, Edentulous Lower   Pulmonary shortness of breath, COPDCurrent Smoker,  breath sounds clear to auscultation        Cardiovascular hypertension, + angina + CAD, + Past MI, + Cardiac Stents, + Peripheral Vascular Disease and +CHF + Cardiac Defibrillator Rhythm:Regular Rate:Normal + Systolic murmurs Reduced EF dates back  To 2013 ECHo c EF 23-30%   Neuro/Psych  Neuromuscular disease CVA    GI/Hepatic GERD-  ,  Endo/Other    Renal/GU Renal disease     Musculoskeletal  (+) Arthritis -,   Abdominal   Peds  Hematology  (+) anemia ,   Anesthesia Other Findings   Reproductive/Obstetrics                            Anesthesia Physical Anesthesia Plan  ASA: IV  Anesthesia Plan: General   Post-op Pain Management:    Induction: Intravenous  Airway Management Planned: Oral ETT  Additional Equipment: Arterial line  Intra-op Plan:   Post-operative Plan: Extubation in OR  Informed Consent: I have reviewed the patients History and Physical, chart, labs and discussed the procedure including the risks, benefits and alternatives for the proposed anesthesia with the patient or authorized representative who has indicated his/her understanding and acceptance.   Dental advisory given  Plan Discussed with:   Anesthesia Plan Comments: (Cardiac studies noted , moderatae to increased for surgery)       Anesthesia Quick Evaluation

## 2015-05-10 NOTE — Op Note (Signed)
Operative Note   DATE OF OPERATION: 8.23.2016  LOCATION: [facility]- outpatient  SURGICAL DIVISION: Plastic Surgery  PREOPERATIVE DIAGNOSES: 1. Mohs defect lower lip 2. Squamous cell carcinoma lower lip  POSTOPERATIVE DIAGNOSES:  same  PROCEDURE:  Bilateral Karapandzic flaps for lower lip reconstruction  SURGEON: Irene Limbo MD MBA  ASSISTANT: none  ANESTHESIA:  General.   EBL: 25 ml  COMPLICATIONS: None immediate.   INDICATIONS FOR PROCEDURE:  The patient, [name], is a 69 y.o. female born on 1946/07/27, is here for reconstruction lower lip following Mohs resection skin cancer.   FINDINGS: Full thickness defect right lower lip not involving commissure, 3 cm width. Length lower lip on slight stretch, 7 cm.   DESCRIPTION OF PROCEDURE:  The patient was taken to the operating room. SCDs were placed and IV antibiotics were given. Of note, the patient's referring Mohs surgeon had placed silk sutures to approximate defect so that patient could eat until surgery. These were removed prior to prep. The patient's operative site was prepped and draped in a sterile fashion. A time out was performed and all information was confirmed to be correct. Local anesthetic infiltrated to perform bilateral infraorbital and mental nerve blocks. Additional local anesthetic infiltrated along proposed flaps. Sharp excision of margins of wound completed. Bilateral Karapandzic flaps designed with width of flap marked equal to height of defect. Incision curved laterally at commissure into nasolabial fold. Oral mucosa incised with scissors superior to gingivobuccal sulcus for 1-2 cm bilateral. Sharp incision made through skin. Facial neurovascular bundle identified bilaterally and preserved. Orbicularis oris muscle divided with cautery anterior to neurovascular bundle. Flaps advanced into defect without tension. Closure completed with interrupted 4-0 chromic for closure mucosa. Muscle repair completed with  4-0 vicryl interrupted. Buried 5-0 vicryl used to approximate dermis. Vermillion border and wet dry mucosa aligned with simple interrupted 5-0 plain gut. Skin closure completed with short running 6-0 prolene. Remainder vermillon closed with interrupted 5-0 plain gut. Antibiotic ointment applied.   The patient was allowed to wake from anesthesia, extubated and taken to the recovery room in satisfactory condition.   SPECIMENS: none  DRAINS: none  Irene Limbo, MD [facility] Plastic & Reconstructive Surgery [phone]

## 2015-05-10 NOTE — Transfer of Care (Signed)
Immediate Anesthesia Transfer of Care Note  Patient: [name]  Procedure(s) Performed: Procedure(s): Karapandandzic flap to lower lip  (N/A)  Patient Location: PACU  Anesthesia Type:General  Level of Consciousness: awake, alert  and oriented  Airway & Oxygen Therapy: Patient connected to face mask  Post-op Assessment: Report given to RN  Post vital signs: stable  Last Vitals:  Filed Vitals:   05/10/15 1220  BP: 155/108  Pulse: 110  Temp: 36.7 C  Resp: 15    Complications: No apparent anesthesia complications

## 2015-05-10 NOTE — Progress Notes (Signed)
Aline d/c with cath tip intact pressure applied x25min pressure bandage applied

## 2015-05-10 NOTE — Interval H&P Note (Signed)
History and Physical Interval Note:  05/10/2015 9:06 AM  [name]  has presented today for surgery, with the diagnosis of MOHS DEFECT LIP AND SQUAMOUS CELL CARCINOMA LIP   The various methods of treatment have been discussed with the patient and family. After consideration of risks, benefits and other options for treatment, the patient has consented to  Procedure(s): ADJACENT TISSUE TRANSFER LIP POSSIBLE ABBE FLAP  (N/A) as a surgical intervention .  The patient's history has been reviewed, patient examined, no change in status, stable for surgery.  I have reviewed the patient's chart and labs.  Questions were answered to the patient's satisfaction.     Audreena Sachdeva

## 2015-05-11 ENCOUNTER — Encounter (HOSPITAL_COMMUNITY): Payer: Self-pay | Admitting: Plastic Surgery

## 2015-06-10 ENCOUNTER — Other Ambulatory Visit: Payer: Self-pay | Admitting: *Deleted

## 2015-06-10 MED ORDER — METOPROLOL SUCCINATE ER 50 MG PO TB24: 50.0000 mg | ORAL_TABLET | Freq: Every day | ORAL | Status: DC

## 2015-06-14 ENCOUNTER — Other Ambulatory Visit: Payer: Self-pay

## 2015-06-21 ENCOUNTER — Encounter: Payer: Self-pay | Admitting: *Deleted

## 2015-06-21 ENCOUNTER — Other Ambulatory Visit: Payer: Self-pay | Admitting: Internal Medicine

## 2015-07-13 ENCOUNTER — Ambulatory Visit (INDEPENDENT_AMBULATORY_CARE_PROVIDER_SITE_OTHER): Payer: Commercial Managed Care - HMO | Admitting: *Deleted

## 2015-07-13 DIAGNOSIS — I5021 Acute systolic (congestive) heart failure: Secondary | ICD-10-CM

## 2015-07-13 LAB — CUP PACEART INCLINIC DEVICE CHECK
Battery Remaining Longevity: 62.4
Date Time Interrogation Session: 20161026093248
HIGH POWER IMPEDANCE MEASURED VALUE: 43 Ohm
Implantable Lead Implant Date: 20100920
Implantable Lead Model: 7120
Lead Channel Pacing Threshold Amplitude: 0.75 V
Lead Channel Pacing Threshold Amplitude: 0.75 V
Lead Channel Sensing Intrinsic Amplitude: 12 mV
Lead Channel Setting Sensing Sensitivity: 0.5 mV
MDC IDC LEAD LOCATION: 753860
MDC IDC MSMT LEADCHNL RV IMPEDANCE VALUE: 325 Ohm
MDC IDC MSMT LEADCHNL RV PACING THRESHOLD PULSEWIDTH: 0.4 ms
MDC IDC MSMT LEADCHNL RV PACING THRESHOLD PULSEWIDTH: 0.4 ms
MDC IDC SET LEADCHNL RV PACING AMPLITUDE: 2.5 V
MDC IDC SET LEADCHNL RV PACING PULSEWIDTH: 0.4 ms
MDC IDC STAT BRADY RV PERCENT PACED: 0.06 %
Pulse Gen Serial Number: 726698

## 2015-07-13 NOTE — Progress Notes (Signed)
ICD check in clinic. Normal device function. Threshold and sensing consistent with previous device measurements. Impedance trends stable over time. No evidence of any ventricular arrhythmias. Histogram distribution appropriate for patient and level of activity. No changes made this session. Device programmed at appropriate safety margins. Device programmed to optimize intrinsic conduction. Estimated longevity 5 years. Pt enrolled in remote follow-up. ROV with GT 08/24/15.

## 2015-07-16 ENCOUNTER — Other Ambulatory Visit: Payer: Self-pay | Admitting: Internal Medicine

## 2015-07-20 ENCOUNTER — Encounter: Payer: Self-pay | Admitting: Internal Medicine

## 2015-07-21 ENCOUNTER — Encounter: Payer: Self-pay | Admitting: Physical Medicine & Rehabilitation

## 2015-07-25 ENCOUNTER — Encounter: Payer: Self-pay | Admitting: Family

## 2015-07-27 ENCOUNTER — Ambulatory Visit (HOSPITAL_COMMUNITY)
Admission: RE | Admit: 2015-07-27 | Discharge: 2015-07-27 | Disposition: A | Discharge status: Home / Self Care | Payer: Commercial Managed Care - HMO | Source: Ambulatory Visit | Attending: Family | Admitting: Family

## 2015-07-27 ENCOUNTER — Ambulatory Visit: Payer: Commercial Managed Care - HMO | Admitting: Family

## 2015-07-27 DIAGNOSIS — I6523 Occlusion and stenosis of bilateral carotid arteries: Secondary | ICD-10-CM

## 2015-07-27 DIAGNOSIS — R202 Paresthesia of skin: Secondary | ICD-10-CM

## 2015-08-01 ENCOUNTER — Other Ambulatory Visit: Payer: Self-pay | Admitting: Internal Medicine

## 2015-08-02 NOTE — Telephone Encounter (Signed)
New message        *STAT* If patient is at the pharmacy, call can be transferred to refill team.   1. Which medications need to be refilled? (please list name of each medication and dose if known)  Metoprolol succinate 50mg  2. Which pharmacy/location (including street and city if local pharmacy) is medication to be sent to? [facility]  3. Do they need a 30 day or 90 day supply? [place]

## 2015-08-24 ENCOUNTER — Encounter: Payer: Commercial Managed Care - HMO | Admitting: Internal Medicine

## 2015-08-26 ENCOUNTER — Ambulatory Visit: Payer: Commercial Managed Care - HMO | Admitting: Physical Medicine & Rehabilitation

## 2015-08-29 ENCOUNTER — Other Ambulatory Visit: Payer: Self-pay | Admitting: Internal Medicine

## 2015-09-07 ENCOUNTER — Encounter: Payer: Self-pay | Admitting: Family

## 2015-09-14 ENCOUNTER — Encounter: Payer: Self-pay | Admitting: Family

## 2015-09-14 ENCOUNTER — Ambulatory Visit (HOSPITAL_COMMUNITY)
Admission: RE | Admit: 2015-09-14 | Discharge: 2015-09-14 | Disposition: A | Discharge status: Home / Self Care | Payer: Commercial Managed Care - HMO | Source: Ambulatory Visit | Attending: Family | Admitting: Family

## 2015-09-14 ENCOUNTER — Other Ambulatory Visit: Payer: Self-pay | Admitting: Vascular Surgery

## 2015-09-14 ENCOUNTER — Ambulatory Visit (INDEPENDENT_AMBULATORY_CARE_PROVIDER_SITE_OTHER)
Admission: RE | Admit: 2015-09-14 | Discharge: 2015-09-14 | Disposition: A | Discharge status: Home / Self Care | Payer: Commercial Managed Care - HMO | Source: Ambulatory Visit | Attending: Family | Admitting: Family

## 2015-09-14 ENCOUNTER — Ambulatory Visit (INDEPENDENT_AMBULATORY_CARE_PROVIDER_SITE_OTHER): Payer: Commercial Managed Care - HMO | Admitting: Family

## 2015-09-14 VITALS — BP 119/72 | HR 63 | Temp 97.6°F | Resp 16 | Ht 64.0 in | Wt 106.0 lb

## 2015-09-14 DIAGNOSIS — Z95828 Presence of other vascular implants and grafts: Secondary | ICD-10-CM | POA: Diagnosis not present

## 2015-09-14 DIAGNOSIS — N189 Chronic kidney disease, unspecified: Secondary | ICD-10-CM | POA: Diagnosis not present

## 2015-09-14 DIAGNOSIS — Z720 Tobacco use: Secondary | ICD-10-CM | POA: Diagnosis not present

## 2015-09-14 DIAGNOSIS — F17200 Nicotine dependence, unspecified, uncomplicated: Secondary | ICD-10-CM

## 2015-09-14 DIAGNOSIS — Z48812 Encounter for surgical aftercare following surgery on the circulatory system: Secondary | ICD-10-CM

## 2015-09-14 DIAGNOSIS — I129 Hypertensive chronic kidney disease with stage 1 through stage 4 chronic kidney disease, or unspecified chronic kidney disease: Secondary | ICD-10-CM | POA: Diagnosis not present

## 2015-09-14 DIAGNOSIS — I739 Peripheral vascular disease, unspecified: Secondary | ICD-10-CM | POA: Insufficient documentation

## 2015-09-14 DIAGNOSIS — I779 Disorder of arteries and arterioles, unspecified: Secondary | ICD-10-CM

## 2015-09-14 DIAGNOSIS — I6523 Occlusion and stenosis of bilateral carotid arteries: Secondary | ICD-10-CM

## 2015-09-14 DIAGNOSIS — E782 Mixed hyperlipidemia: Secondary | ICD-10-CM | POA: Diagnosis not present

## 2015-09-14 DIAGNOSIS — Z4889 Encounter for other specified surgical aftercare: Secondary | ICD-10-CM

## 2015-09-14 NOTE — Patient Instructions (Signed)

## 2015-09-14 NOTE — Progress Notes (Signed)
VASC[facility]ISTS OF  HISTORY AND PHYSICAL -PAD  History of Present Illness [name] is a 69 y.o. female patient of Dr. Kellie Simmering who returns for continued followup regarding her severe lower extremity occlusive disease. She had an urgent right axillofemoral bypass by Dr. Donnetta Hutching in October of 2014. She had a previous femoral-femoral bypass performed by Dr. Kellie Simmering and has had bilateral femoral-popliteal bypass grafts.  She has severe left hip to ankle pain aggravated by lying in bed, not with walking. She has known lumbar spine issues and was receiving ESI's, she states with no benefit. Pt also states that she has rare sciatic type radiating pain in her right leg. She is also getting unknown type of injections in both knees.  Pt denies non healing wounds. Pt states she had a "light stroke" about 2007, she does not remember how this manifested, denies hemiparesis, denies aphasia. Had an MI in 2010, had stents placed, also has a pacemaker and defibrillator. Has multiple purpuric areas on arms, states she bruises easily. She fell in the Summer of 2016, still has bruises on left lower leg. Dropped a cola on her left foot in December 2016. She "fell into the Christmas tree" December of 2016.  She admits to not walking much, "I'm just lazy".  "I don't have the strength to sweep or mop".   Pt Diabetic: No Pt smoker: smoker (1/2 ppd, started at age 39 yrs)  Pt meds include: Statin: no ASA: Yes, 81 mg daily Other anticoagulants/antiplatelets: no     Past Medical History  Diagnosis Date  . PVD (peripheral vascular disease) ([place])     status post prior femoral-popliteal bypass  . Back pain   . Arthritis   . CAD (coronary artery disease)     status post prior stenting to the proximal RCA, status post anterior STEMI 10/2008 (RCA occluded at the prior stent, LAD treated with a bare metal stent and a drug-eluting stent),  . Chronic systolic heart failure (HCC)     echo  11/2011: EF 25-30%, AS and apical AK, trivial AI, mild MR, PASP 34.  . Anemia   . Ischemic cardiomyopathy   . AICD (automatic cardioverter/defibrillator) present   . HLD (hyperlipidemia)   . GERD (gastroesophageal reflux disease)   . RLS (restless legs syndrome)   . Depression   . Tobacco abuse   . Pain in joint, lower leg   . Depressive disorder, not elsewhere classified   . Acute myocardial infarction of other anterior wall, subsequent episode of care 2010  . CHF (congestive heart failure) ([place])   . COPD (chronic obstructive pulmonary disease) ([place])   . DVT (deep venous thrombosis) ([place])   . Mixed hyperlipidemia   . Gout   . Atherosclerosis of native arteries of the extremities with intermittent claudication   . Osteoarthritis   . Eczema   . Fall at home July 5,  and  Oct.  2015  . Cancer (HCC)     Skin  of Left  Face- " upper eye"5/15  . Hypertension   . Anginal pain ([place])   . Shortness of breath dyspnea     with exertion  . Chronic kidney disease     "years ago, saw Dr. Mcarthur Rossetti"  "no one has said anything recently"  . Stroke [facility])     before Heart attack, 2010  . Neuropathy, idiopathic     peripherial    Social History Social History  Substance Use Topics  . Smoking status: Current  Every Day Smoker -- 1.00 packs/day for 55 years    Types: Cigarettes  . Smokeless tobacco: Never Used     Comment: 05/09/15- "I've smoked 1 in the past 3 days"  "Tried to smoke"  . Alcohol Use: No    Family History Family History  Problem Relation Age of Onset  . Peripheral vascular disease Sister   . AAA (abdominal aortic aneurysm) Sister   . Diabetes Mother   . Varicose Veins Mother   . Peripheral vascular disease Mother   . Hypertension Mother   . Heart attack Father     Massive Heart Attack  . CAD Father     Past Surgical History  Procedure Laterality Date  . Psychologist, forensic    . Foot surgery Bilateral     removed nrves off bottomof feet."  . Pr vein bypass  graft,aorto-fem-pop      fem fem  04/07/09  . Abdominal hysterectomy    . Cardiac defibrillator placement    . Eye surgery  2013    Cataract bilateral   . Axillary-femoral bypass graft Right 06/29/2013    Procedure: BYPASS GRAFT AXILLA-RIGHT FEMORAL;  Surgeon: Rosetta Posner, MD;  Location: [facility];  Service: Vascular;  Laterality: Right;  . Femoral-tibial bypass graft  2002  . Femoral-femoral bypass graft  03/2009    S/P left to right fem-fem bypass graft  . Abdominal aortagram Bilateral 06/26/2013    Procedure: ABDOMINAL AORTAGRAM;  Surgeon: Elam Dutch, MD;  Location: [facility] C[facility] LAB;  Service: Cardiovascular;  Laterality: Bilateral;  . Colonoscopy    . Cleft lip repair N/A 05/10/2015    Procedure: Karapandandzic flap to lower lip ;  Surgeon: Irene Limbo, MD;  Location: [place];  Service: Plastics;  Laterality: N/A;    Allergies  Allergen Reactions  . Gabapentin Other (See Comments)    300 mg upsets her stomach and 100 mg affect her sleep  . Naproxen Other (See Comments)    Blurred vision  . Prednisone Other (See Comments)    cranky    Current Outpatient Prescriptions  Medication Sig Dispense Refill  . allopurinol (ZYLOPRIM) 100 MG tablet Take 100 mg by mouth daily.     Marland Kitchen aspirin EC 81 MG tablet Take 81 mg by mouth daily.    . Cholecalciferol (VITAMIN D) 1000 UNITS capsule Take 1,000 Units by mouth daily.      . clonazePAM (KLONOPIN) 1 MG tablet Take 2 mg by mouth at bedtime.     Marland Kitchen estrogens, conjugated, (PREMARIN) 1.25 MG tablet Take 1.25 mg by mouth daily.    Marland Kitchen ezetimibe (ZETIA) 10 MG tablet Take 10 mg by mouth daily.    . ferrous sulfate 325 (65 FE) MG tablet Take 325 mg by mouth daily with breakfast.      . furosemide (LASIX) 20 MG tablet TAKE 1&1/2 TABLETS BY MOUTH DAILY 45 tablet 1  . meclizine (ANTIVERT) 12.5 MG tablet Take 12.5 mg by mouth 3 (three) times daily as needed for dizziness.    . metoprolol succinate (TOPROL-XL) 50 MG 24 hr tablet Take 1 tablet (50 mg total)  by mouth daily. Take with or immediately following a meal. 30 tablet 0  . metoprolol succinate (TOPROL-XL) 50 MG 24 hr tablet TAKE 1 TABLET BY MOUTH DAILY 14 tablet 0  . nitroGLYCERIN (NITROSTAT) 0.4 MG SL tablet Place 0.4 mg under the tongue every 5 (five) minutes as needed for chest pain.    Marland Kitchen oxyCODONE-acetaminophen (ROXICET) 5-325 MG per tablet  Take 1-2 tablets by mouth every 4 (four) hours as needed for severe pain. (Patient not taking: Reported on 07/13/2015) 40 tablet 0  . pantoprazole (PROTONIX) 40 MG tablet Take 40 mg by mouth 2 (two) times daily.     . potassium chloride SA (KLOR-CON M20) 20 MEQ tablet TAKE 1 TABLET BY MOUTH ON MONDAYS,WEDNESDAYS, AND FRIDAYS 90 tablet 0  . PRESCRIPTION MEDICATION Apply 1 application topically daily as needed (itching). Compounded cream:  Ketoconazole 2% cream/ fluticasone 0.05% cream 1:1    . ramipril (ALTACE) 2.5 MG capsule TAKE ONE CAPSULE BY MOUTH EVERY DAY 90 capsule 3  . zolpidem (AMBIEN) 10 MG tablet Take 10 mg by mouth at bedtime.     . [DISCONTINUED] mirtazapine (REMERON) 15 MG tablet Take 15 mg by mouth as needed.      . [DISCONTINUED] ranitidine (ZANTAC) 300 MG tablet Take 300 mg by mouth 2 (two) times daily.       No current facility-administered medications for this visit.    ROS: See HPI for pertinent positives and negatives.   Physical Examination  Filed Vitals:   09/14/15 1304 09/14/15 1307  BP: 98/60 119/72  Pulse: 63 63  Temp:  97.6 F (36.4 C)  TempSrc:  Oral  Resp:  16  Height:  5\' 4"  (1.626 m)  Weight:  106 lb (48.081 kg)  SpO2:  100%   Body mass index is 18.19 kg/(m^2).    General: A&O x 3, WDW. Gait: slow, deliberate, using cane Eyes: PERRLA. Pulmonary: CTAB, no wheezes , rales or rhonchi, + chronic moist cough. Cardiac: regular rhythm, + murmur.     Carotid Bruits Left Right   Negative Negative  Aorta is palpable. Radial pulses: 1+ right, not palpable left, left brachial is 2+  palpable.   VASCULAR EXAM: Extremities without ischemic changes  without Gangrene; without open wounds. Several abrasions including both knees, multiple bruises on legs.  Right ax-fem graft is palpable.     LE Pulses LEFT RIGHT   FEMORAL 3+ palpable 2+ palpable    POPLITEAL not palpable  not palpable   POSTERIOR TIBIAL not palpable  not palpable    DORSALIS PEDIS  ANTERIOR TIBIAL not palpable  1+ palpable    Abdomen: soft, NT, no palpated masses. Skin: many purpuric areas on forearms and backs of hands, no ulcers. Musculoskeletal: no muscle wasting or atrophy.Mild kyphosis.  Neurologic: A&O X 3; Appropriate Affect ; SENSATION: normal; MOTOR FUNCTION: moving all extremities equally, motor strength 5/5 in arms, 3/5 in legs. Speech is fluent/normal. CN 2-12 intact.            Non-Invasive Vascular Imaging: DATE: 09/14/2015   Carotid Duplex: <40% stenosis of the bilateral internal carotid arteries. Bilateral vertebral artery is antegrade Right subclavian artery is abnormal due to presence of axillary-femoral bypass graft. No significant change compared to prior exam.    ABI (Date: 09/14/2015)  R: 1.0 (1.03, 07/21/14), DP: biphasic, PT: triphasic, TBI: 0.45 (0.61)  L: 1.09 (1.14), DP: triphasic, PT: triphasic, TBI: 0.49 (0.41)   ASSESSMENT: [name] is a 69 y.o. female who is s/p left superficial femoral to posterior tibial artery bypass graft on 10/07/00, right femoral to below knee popliteal artery bypass graft on 04/23/05, femoral to femoral bypass graft on 04/07/09, right axillary to femoral bypass graft on 06/29/13.  She does not seem to walk enough to elicit claudication sx's due to radiculopathy.  She also has mild carotid artery  stenosis. She had a stroke in 2007,  none subsequently. Today's carotid duplex suggests minimal bilateral ICA stenoses. Right subclavian artery is abnormal due to presence of axillary-femoral bypass graft. No significant change compared to prior exam.    ABI's remain normal with tri and biphasic waveforms.  Fortunately she does not have DM, unfortunately she continues to smoke, see Plan. She takes a daily ASA, is not taking a statin.    PLAN:  The patient was counseled re smoking cessation and given several free resources re smoking cessation.  Daily seated leg exercises discussed and demonstrated.   Based on the patient's vascular studies and examination, pt will return to clinic in 1 year with ABI's and carotid duplex.   I discussed in depth with the patient the nature of atherosclerosis, and emphasized the importance of maximal medical management including strict control of blood pressure, blood glucose, and lipid levels, obtaining regular exercise, and cessation of smoking.  The patient is aware that without maximal medical management the underlying atherosclerotic disease process will progress, limiting the benefit of any interventions.  The patient was given information about PAD including signs, symptoms, treatment, what symptoms should prompt the patient to seek immediate medical care, and risk reduction measures to take.  Clemon Chambers, RN, MSN, FNP-C [facility] [employer] Phone: [phone]  Clinic MD: Kellie Simmering   09/14/2015 1:04 PM

## 2015-09-15 NOTE — Addendum Note (Signed)
Addended by: Dorthula Rue L on: 09/15/2015 09:48 AM   Modules accepted: Orders

## 2015-09-23 ENCOUNTER — Other Ambulatory Visit: Payer: Self-pay | Admitting: Internal Medicine

## 2015-09-23 NOTE — Telephone Encounter (Signed)
furosemide (LASIX) 20 MG tablet  Medication   Date: 07/18/2015  Department: [facility]  Ordering/Authorizing: Evans Lance, MD      Order Providers    Prescribing Provider Encounter Provider   Evans Lance, MD Evans Lance, MD    Medication Detail      Disp Refills Start End     furosemide (LASIX) 20 MG tablet 45 tablet 1 07/18/2015     Sig: TAKE 1&1/2 TABLETS BY MOUTH DAILY    E-Prescribing Status: Receipt confirmed by pharmacy (07/18/2015 11:15 AM EDT)     Pharmacy    [id number] - [place], [place] - [address]

## 2015-09-27 ENCOUNTER — Ambulatory Visit (INDEPENDENT_AMBULATORY_CARE_PROVIDER_SITE_OTHER): Payer: Commercial Managed Care - HMO | Admitting: Internal Medicine

## 2015-09-27 ENCOUNTER — Encounter: Payer: Self-pay | Admitting: Internal Medicine

## 2015-09-27 VITALS — BP 128/68 | HR 50 | Ht 64.0 in | Wt 106.6 lb

## 2015-09-27 DIAGNOSIS — I255 Ischemic cardiomyopathy: Secondary | ICD-10-CM

## 2015-09-27 DIAGNOSIS — I5022 Chronic systolic (congestive) heart failure: Secondary | ICD-10-CM

## 2015-09-27 DIAGNOSIS — Z95810 Presence of automatic (implantable) cardiac defibrillator: Secondary | ICD-10-CM

## 2015-09-27 DIAGNOSIS — I10 Essential (primary) hypertension: Secondary | ICD-10-CM | POA: Diagnosis not present

## 2015-09-27 LAB — CUP PACEART INCLINIC DEVICE CHECK
Brady Statistic RV Percent Paced: 0.07 %
Date Time Interrogation Session: 20170110112121
HIGH POWER IMPEDANCE MEASURED VALUE: 51 Ohm
Implantable Lead Implant Date: 20100920
Implantable Lead Model: 7120
Lead Channel Pacing Threshold Amplitude: 0.75 V
Lead Channel Pacing Threshold Pulse Width: 0.4 ms
Lead Channel Pacing Threshold Pulse Width: 0.4 ms
Lead Channel Sensing Intrinsic Amplitude: 12 mV
MDC IDC LEAD LOCATION: 753860
MDC IDC MSMT BATTERY REMAINING LONGEVITY: 60 mo
MDC IDC MSMT LEADCHNL RV IMPEDANCE VALUE: 350 Ohm
MDC IDC MSMT LEADCHNL RV PACING THRESHOLD AMPLITUDE: 0.75 V
MDC IDC SET LEADCHNL RV PACING AMPLITUDE: 2.5 V
MDC IDC SET LEADCHNL RV PACING PULSEWIDTH: 0.4 ms
MDC IDC SET LEADCHNL RV SENSING SENSITIVITY: 0.5 mV
Pulse Gen Serial Number: 726698

## 2015-09-27 NOTE — Assessment & Plan Note (Signed)
Her [employer]. Jude ICD is working normally. Will recheck in several months. 

## 2015-09-27 NOTE — Assessment & Plan Note (Signed)
Her blood pressure is well controlled. No change in meds. 

## 2015-09-27 NOTE — Assessment & Plan Note (Signed)
Her symptoms are class 2. She will continue her current meds. 

## 2015-09-27 NOTE — Progress Notes (Signed)
HPI [name] returns today for followup. She is a very pleasant 70 year old woman with chronic peripheral vascular disease and ischemic cardiomyopathy, chronic systolic heart failure, status post ICD implantation. She is 6 years out from revascularization and ICD insertion. No syncope and no recent ICD shocks. She has claudication and recently underwent an Ax-fem bypass. She is followed by Dr. Kellie Simmering and Early for this. No recent ICD shocks.  She has chronic dyspnea which is multifactorial.  Allergies  Allergen Reactions  . Morphine     Other reaction(s): Other (See Comments) hallucinations REACTION: makes me crazy  . Gabapentin Other (See Comments)    300 mg upsets her stomach and 100 mg affect her sleep  . Naproxen Other (See Comments)    Blurred vision  . Prednisone Other (See Comments)    cranky     Current Outpatient Prescriptions  Medication Sig Dispense Refill  . allopurinol (ZYLOPRIM) 100 MG tablet Take 100 mg by mouth daily.     Marland Kitchen aspirin EC 81 MG tablet Take 81 mg by mouth daily.    . Cholecalciferol (VITAMIN D) 1000 UNITS capsule Take 1,000 Units by mouth daily.      . clonazePAM (KLONOPIN) 1 MG tablet Take 2 mg by mouth at bedtime.     Marland Kitchen estrogens, conjugated, (PREMARIN) 1.25 MG tablet Take 1.25 mg by mouth daily.    Marland Kitchen ezetimibe (ZETIA) 10 MG tablet Take 10 mg by mouth daily.    . ferrous sulfate 325 (65 FE) MG tablet Take 325 mg by mouth daily with breakfast.      . furosemide (LASIX) 20 MG tablet TAKE 1&1/2 TABLETS BY MOUTH DAILY 45 tablet 0  . HYDROcodone-acetaminophen (NORCO) 10-325 MG tablet Take 1 tablet by mouth every 6 (six) hours as needed (pain).     . meclizine (ANTIVERT) 12.5 MG tablet Take 12.5 mg by mouth 3 (three) times daily as needed for dizziness.    . metoprolol succinate (TOPROL-XL) 50 MG 24 hr tablet Take 1 tablet (50 mg total) by mouth daily. Take with or immediately following a meal. 30 tablet 0  . nitroGLYCERIN (NITROSTAT) 0.4 MG SL tablet Place  0.4 mg under the tongue every 5 (five) minutes as needed for chest pain.    Marland Kitchen oxyCODONE-acetaminophen (ROXICET) 5-325 MG per tablet Take 1-2 tablets by mouth every 4 (four) hours as needed for severe pain. 40 tablet 0  . pantoprazole (PROTONIX) 40 MG tablet Take 40 mg by mouth 2 (two) times daily.     . potassium chloride SA (KLOR-CON M20) 20 MEQ tablet TAKE 1 TABLET BY MOUTH ON MONDAYS,WEDNESDAYS, AND FRIDAYS 90 tablet 0  . PRESCRIPTION MEDICATION Apply 1 application topically daily as needed (itching). Compounded cream:  Ketoconazole 2% cream/ fluticasone 0.05% cream 1:1    . ramipril (ALTACE) 2.5 MG capsule TAKE ONE CAPSULE BY MOUTH EVERY DAY 90 capsule 3  . traMADol (ULTRAM) 50 MG tablet Take 1 tablet by mouth daily as needed. Pain    . zolpidem (AMBIEN) 10 MG tablet Take 10 mg by mouth at bedtime.     . [DISCONTINUED] mirtazapine (REMERON) 15 MG tablet Take 15 mg by mouth as needed.      . [DISCONTINUED] ranitidine (ZANTAC) 300 MG tablet Take 300 mg by mouth 2 (two) times daily.       No current facility-administered medications for this visit.     Past Medical History  Diagnosis Date  . PVD (peripheral vascular disease) ([place])     status post  prior femoral-popliteal bypass  . Back pain   . Arthritis   . CAD (coronary artery disease)     status post prior stenting to the proximal RCA, status post anterior STEMI 10/2008 (RCA occluded at the prior stent, LAD treated with a bare metal stent and a drug-eluting stent),  . Chronic systolic heart failure (HCC)     echo 11/2011: EF 25-30%, AS and apical AK, trivial AI, mild MR, PASP 34.  . Anemia   . Ischemic cardiomyopathy   . AICD (automatic cardioverter/defibrillator) present   . HLD (hyperlipidemia)   . GERD (gastroesophageal reflux disease)   . RLS (restless legs syndrome)   . Depression   . Tobacco abuse   . Pain in joint, lower leg   . Depressive disorder, not elsewhere classified   . Acute myocardial infarction of other anterior  wall, subsequent episode of care 2010  . CHF (congestive heart failure) ([place])   . COPD (chronic obstructive pulmonary disease) ([place])   . DVT (deep venous thrombosis) ([place])   . Mixed hyperlipidemia   . Gout   . Atherosclerosis of native arteries of the extremities with intermittent claudication   . Osteoarthritis   . Eczema   . Fall at home July 5,  and  Oct.  2015  . Cancer (HCC)     Skin  of Left  Face- " upper eye"5/15  . Hypertension   . Anginal pain ([place])   . Shortness of breath dyspnea     with exertion  . Chronic kidney disease     "years ago, saw Dr. Mcarthur Rossetti"  "no one has said anything recently"  . Stroke [facility])     before Heart attack, 2010  . Neuropathy, idiopathic     peripherial    ROS:   All systems reviewed and negative except as noted in the HPI.   Past Surgical History  Procedure Laterality Date  . Psychologist, forensic    . Foot surgery Bilateral     removed nrves off bottomof feet."  . Pr vein bypass graft,aorto-fem-pop      fem fem  04/07/09  . Abdominal hysterectomy    . Cardiac defibrillator placement    . Eye surgery  2013    Cataract bilateral   . Axillary-femoral bypass graft Right 06/29/2013    Procedure: BYPASS GRAFT AXILLA-RIGHT FEMORAL;  Surgeon: Rosetta Posner, MD;  Location: [facility];  Service: Vascular;  Laterality: Right;  . Femoral-tibial bypass graft  2002  . Femoral-femoral bypass graft  03/2009    S/P left to right fem-fem bypass graft  . Abdominal aortagram Bilateral 06/26/2013    Procedure: ABDOMINAL AORTAGRAM;  Surgeon: Elam Dutch, MD;  Location: [facility] CATH LAB;  Service: Cardiovascular;  Laterality: Bilateral;  . Colonoscopy    . Cleft lip repair N/A 05/10/2015    Procedure: Karapandandzic flap to lower lip ;  Surgeon: Irene Limbo, MD;  Location: [facility];  Service: Plastics;  Laterality: N/A;     Family History  Problem Relation Age of Onset  . Peripheral vascular disease Sister   . AAA (abdominal aortic aneurysm) Sister   . Diabetes  Mother   . Varicose Veins Mother   . Peripheral vascular disease Mother   . Hypertension Mother   . Heart attack Father     Massive Heart Attack  . CAD Father      Social History   Social History  . Marital Status: Married    Spouse Name: N/A  . Number  of Children: N/A  . Years of Education: N/A   Occupational History  . Not on file.   Social History Main Topics  . Smoking status: Current Every Day Smoker -- 1.00 packs/day for 55 years    Types: Cigarettes  . Smokeless tobacco: Never Used     Comment: 05/09/15- "I've smoked 1 in the past 3 days"  "Tried to smoke"  . Alcohol Use: No  . Drug Use: No  . Sexual Activity: Not on file   Other Topics Concern  . Not on file   Social History Narrative     BP 128/68 mmHg  Pulse 50  Ht 5\' 4"  (1.626 m)  Wt 106 lb 9.6 oz (48.353 kg)  BMI 18.29 kg/m2  Physical Exam:  Chronically ill appearing 70 yo woman, who looks older than her stated age but in NAD HEENT: Unremarkable Neck:  7 cm JVD, no thyromegally Back:  No CVA tenderness Lungs:  Clear with decreased breath sounds throughout. No wheezes or rhonchi. HEART:  Regular rate rhythm, no murmurs, no rubs, no clicks Abd:  soft, positive bowel sounds, no organomegally, no rebound, no guarding Ext:  2 plus pulses, no edema, no cyanosis, no clubbing Skin:  No rashes no nodules, multiple ecchymotic areas Neuro:  CN II through XII intact, motor grossly intact   DEVICE  Normal device function.  See PaceArt for details.   Assess/Plan:

## 2015-09-27 NOTE — Patient Instructions (Signed)
Medication Instructions:  Your physician recommends that you continue on your current medications as directed. Please refer to the Current Medication list given to you today.   Labwork: None ordered   Testing/Procedures: None ordered   Follow-Up: Remote monitoring is used to monitor your  ICD from home. This monitoring reduces the number of office visits required to check your device to one time per year. It allows [place] to keep an eye on the functioning of your device to ensure it is working properly. You are scheduled for a device check from home on 12/27/15. You may send your transmission at any time that day. If you have a wireless device, the transmission will be sent automatically. After your physician reviews your transmission, you will receive a postcard with your next transmission date.  Your physician wants you to follow-up in: 12 months with Dr Knox Saliva will receive a reminder letter in the mail two months in advance. If you don't receive a letter, please call our office to schedule the follow-up appointment.     Any Other Special Instructions Will Be Listed Below (If Applicable).     If you need a refill on your cardiac medications before your next appointment, please call your pharmacy.

## 2015-09-29 ENCOUNTER — Other Ambulatory Visit: Payer: Self-pay | Admitting: Internal Medicine

## 2015-10-07 ENCOUNTER — Encounter: Payer: Self-pay | Admitting: Physical Medicine & Rehabilitation

## 2015-10-19 ENCOUNTER — Encounter: Payer: Self-pay | Admitting: Physical Medicine & Rehabilitation

## 2015-10-19 ENCOUNTER — Encounter
Payer: Commercial Managed Care - HMO | Attending: Physical Medicine & Rehabilitation | Admitting: Physical Medicine & Rehabilitation

## 2015-10-19 VITALS — BP 112/85 | HR 50

## 2015-10-19 DIAGNOSIS — F329 Major depressive disorder, single episode, unspecified: Secondary | ICD-10-CM | POA: Diagnosis not present

## 2015-10-19 DIAGNOSIS — G4700 Insomnia, unspecified: Secondary | ICD-10-CM | POA: Diagnosis not present

## 2015-10-19 DIAGNOSIS — Z79899 Other long term (current) drug therapy: Secondary | ICD-10-CM | POA: Diagnosis not present

## 2015-10-19 DIAGNOSIS — M25569 Pain in unspecified knee: Secondary | ICD-10-CM | POA: Diagnosis not present

## 2015-10-19 DIAGNOSIS — Z720 Tobacco use: Secondary | ICD-10-CM

## 2015-10-19 DIAGNOSIS — G2581 Restless legs syndrome: Secondary | ICD-10-CM | POA: Diagnosis not present

## 2015-10-19 DIAGNOSIS — Z5181 Encounter for therapeutic drug level monitoring: Secondary | ICD-10-CM | POA: Diagnosis not present

## 2015-10-19 DIAGNOSIS — J449 Chronic obstructive pulmonary disease, unspecified: Secondary | ICD-10-CM | POA: Insufficient documentation

## 2015-10-19 DIAGNOSIS — N189 Chronic kidney disease, unspecified: Secondary | ICD-10-CM | POA: Insufficient documentation

## 2015-10-19 DIAGNOSIS — I509 Heart failure, unspecified: Secondary | ICD-10-CM | POA: Insufficient documentation

## 2015-10-19 DIAGNOSIS — R269 Unspecified abnormalities of gait and mobility: Secondary | ICD-10-CM | POA: Diagnosis not present

## 2015-10-19 DIAGNOSIS — Z8673 Personal history of transient ischemic attack (TIA), and cerebral infarction without residual deficits: Secondary | ICD-10-CM | POA: Diagnosis not present

## 2015-10-19 DIAGNOSIS — Z7982 Long term (current) use of aspirin: Secondary | ICD-10-CM | POA: Diagnosis not present

## 2015-10-19 DIAGNOSIS — K219 Gastro-esophageal reflux disease without esophagitis: Secondary | ICD-10-CM | POA: Diagnosis not present

## 2015-10-19 DIAGNOSIS — I2510 Atherosclerotic heart disease of native coronary artery without angina pectoris: Secondary | ICD-10-CM | POA: Insufficient documentation

## 2015-10-19 DIAGNOSIS — Z951 Presence of aortocoronary bypass graft: Secondary | ICD-10-CM | POA: Diagnosis not present

## 2015-10-19 DIAGNOSIS — M5442 Lumbago with sciatica, left side: Secondary | ICD-10-CM

## 2015-10-19 DIAGNOSIS — Z9181 History of falling: Secondary | ICD-10-CM | POA: Diagnosis not present

## 2015-10-19 DIAGNOSIS — Z86718 Personal history of other venous thrombosis and embolism: Secondary | ICD-10-CM | POA: Diagnosis not present

## 2015-10-19 DIAGNOSIS — M5489 Other dorsalgia: Secondary | ICD-10-CM | POA: Insufficient documentation

## 2015-10-19 DIAGNOSIS — I129 Hypertensive chronic kidney disease with stage 1 through stage 4 chronic kidney disease, or unspecified chronic kidney disease: Secondary | ICD-10-CM | POA: Insufficient documentation

## 2015-10-19 DIAGNOSIS — Z716 Tobacco abuse counseling: Secondary | ICD-10-CM

## 2015-10-19 DIAGNOSIS — R45851 Suicidal ideations: Secondary | ICD-10-CM | POA: Diagnosis not present

## 2015-10-19 NOTE — Patient Instructions (Signed)
Pt prefers to follow up with PCP regarding results of xray and follow up PT visit

## 2015-10-19 NOTE — Progress Notes (Addendum)
Subjective:    Patient ID: [name], female    DOB: 11-Oct-1945, 70 y.o.   MRN: [id number]  HPI 70 y/o female with pmh of CAD s/p CABG, AICD, CHF, stroke, falls, COPD, tobacco abuse, depression presents with pain in her left back pain.  Pain started 1 year ago, getting progressively worse.  She does not recall if she fell at that time.  She states she has fallen so much.  The pain starts in her mid back and goes down her leg to her entire foot.  Hydrocodone improves the pain.  Anything exacerbates the pain.  She has associated weakness, denies numbness/tingling.  She cannot describe the pain, "it just hurts".  It is constant.  10/10 in severity today.  Heat does not help.  She does do anything at home.  Her daughter and husband (who she does not live with) assist with ADLs. She has had xrays of her LLE, which showed osteopenia.  She has also had "shots" in her back.   She denies increase in pain upon ambulation.   Pain Inventory Average Pain 10 Pain Right Now 10 My pain is constant and it just hurts  In the last 24 hours, has pain interfered with the following? General activity 10 Relation with others 10 Enjoyment of life 10 What TIME of day is your pain at its worst? all Sleep (in general) Poor  Pain is worse with: no particular reason-just hurts Pain improves with: medication Relief from Meds: 5  Mobility use a cane use a walker ability to climb steps?  no do you drive?  no  Function I need assistance with the following:  household duties and shopping  Neuro/Psych trouble walking dizziness confusion depression loss of taste or smell suicidal thoughts  STATES NO PLAN  Prior Studies Any changes since last visit?  no   Physicians involved in your care Any changes since last visit?  no   Family History  Problem Relation Age of Onset  . Peripheral vascular disease Sister   . AAA (abdominal aortic aneurysm) Sister   . Diabetes Mother   . Varicose Veins Mother    . Peripheral vascular disease Mother   . Hypertension Mother   . Heart attack Father     Massive Heart Attack  . CAD Father    Social History   Social History  . Marital Status: Married    Spouse Name: N/A  . Number of Children: N/A  . Years of Education: N/A   Social History Main Topics  . Smoking status: Current Every Day Smoker -- 1.00 packs/day for 55 years    Types: Cigarettes  . Smokeless tobacco: Never Used     Comment: 05/09/15- "I've smoked 1 in the past 3 days"  "Tried to smoke"  . Alcohol Use: No  . Drug Use: No  . Sexual Activity: Not Asked   Other Topics Concern  . None   Social History Narrative   Past Surgical History  Procedure Laterality Date  . Psychologist, forensic    . Foot surgery Bilateral     removed nrves off bottomof feet."  . Pr vein bypass graft,aorto-fem-pop      fem fem  04/07/09  . Abdominal hysterectomy    . Cardiac defibrillator placement    . Eye surgery  2013    Cataract bilateral   . Axillary-femoral bypass graft Right 06/29/2013    Procedure: BYPASS GRAFT AXILLA-RIGHT FEMORAL;  Surgeon: Rosetta Posner, MD;  Location: [place];  Service: Vascular;  Laterality: Right;  . Femoral-tibial bypass graft  2002  . Femoral-femoral bypass graft  03/2009    S/P left to right fem-fem bypass graft  . Abdominal aortagram Bilateral 06/26/2013    Procedure: ABDOMINAL AORTAGRAM;  Surgeon: Elam Dutch, MD;  Location: [facility] CATH LAB;  Service: Cardiovascular;  Laterality: Bilateral;  . Colonoscopy    . 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 PVD (peripheral vascular disease) ([place])     status post prior femoral-popliteal bypass  . Back pain   . Arthritis   . CAD (coronary artery disease)     status post prior stenting to the proximal RCA, status post anterior STEMI 10/2008 (RCA occluded at  the prior stent, LAD treated with a bare metal stent and a drug-eluting stent),  . Chronic systolic heart failure (HCC)     echo 11/2011: EF 25-30%, AS and apical AK, trivial AI, mild MR, PASP 34.  . Anemia   . Ischemic cardiomyopathy   . AICD (automatic cardioverter/defibrillator) present   . HLD (hyperlipidemia)   . GERD (gastroesophageal reflux disease)   . RLS (restless legs syndrome)   . Depression   . Tobacco abuse   . Pain in joint, lower leg   . Depressive disorder, not elsewhere classified   . Acute myocardial infarction of other anterior wall, subsequent episode of care 2010  . CHF (congestive heart failure) ([place])   . COPD (chronic obstructive pulmonary disease) ([place])   . DVT (deep venous thrombosis) ([place])   . Mixed hyperlipidemia   . Gout   . Atherosclerosis of native arteries of the extremities with intermittent claudication   . Osteoarthritis   . Eczema   . Fall at home July 5,  and  Oct.  2015  . Cancer (HCC)     Skin  of Left  Face- " upper eye"5/15  . Hypertension   . Anginal pain ([place])   . Shortness of breath dyspnea     with exertion  . Chronic kidney disease     "years ago, saw Dr. Mcarthur Rossetti"  "no one has said anything recently"  . Stroke [facility])     before Heart attack, 2010  . Neuropathy, idiopathic     peripherial   BP 112/85 mmHg  Pulse 50  SpO2 98%  Opioid Risk Score:   Fall Risk Score:  `1  Depression screen PHQ 2/9  Depression screen PHQ 2/9 10/19/2015  Decreased Interest 3  Down, Depressed, Hopeless 3  PHQ - 2 Score 6  Altered sleeping 3  Tired, decreased energy 3  Change in appetite 3  Feeling bad or failure about yourself  3  Trouble concentrating 3  Moving slowly or fidgety/restless 0  Suicidal thoughts 3  PHQ-9 Score 24  Difficult doing work/chores Very difficult   Current Outpatient Prescriptions on File Prior to Visit  Medication Sig Dispense Refill  . allopurinol (ZYLOPRIM) 100 MG tablet Take 100 mg by mouth daily.     Marland Kitchen aspirin  EC 81 MG tablet Take 81 mg by mouth daily.    . Cholecalciferol (VITAMIN D) 1000 UNITS capsule Take 1,000 Units by mouth daily.      . clonazePAM (KLONOPIN) 1 MG tablet Take 2 mg by mouth at bedtime.     Marland Kitchen estrogens, conjugated, (PREMARIN) 1.25  MG tablet Take 1.25 mg by mouth daily.    Marland Kitchen ezetimibe (ZETIA) 10 MG tablet Take 10 mg by mouth daily.    . ferrous sulfate 325 (65 FE) MG tablet Take 325 mg by mouth daily with breakfast.      . furosemide (LASIX) 20 MG tablet TAKE 1&1/2 TABLETS BY MOUTH DAILY 45 tablet 0  . HYDROcodone-acetaminophen (NORCO) 10-325 MG tablet Take 1 tablet by mouth every 6 (six) hours as needed (pain).     Marland Kitchen KLOR-CON M20 20 MEQ tablet TAKE 1 TABLET BY MOUTH ON MONDAYS,WEDNESDAYS, AND FRIDAYS 45 tablet 3  . meclizine (ANTIVERT) 12.5 MG tablet Take 12.5 mg by mouth 3 (three) times daily as needed for dizziness.    . metoprolol succinate (TOPROL-XL) 50 MG 24 hr tablet Take 1 tablet (50 mg total) by mouth daily. Take with or immediately following a meal. 30 tablet 0  . nitroGLYCERIN (NITROSTAT) 0.4 MG SL tablet Place 0.4 mg under the tongue every 5 (five) minutes as needed for chest pain.    . pantoprazole (PROTONIX) 40 MG tablet Take 40 mg by mouth 2 (two) times daily.     Marland Kitchen PRESCRIPTION MEDICATION Apply 1 application topically daily as needed (itching). Compounded cream:  Ketoconazole 2% cream/ fluticasone 0.05% cream 1:1    . ramipril (ALTACE) 2.5 MG capsule TAKE ONE CAPSULE BY MOUTH EVERY DAY 90 capsule 3  . traMADol (ULTRAM) 50 MG tablet Take 1 tablet by mouth daily as needed. Pain    . zolpidem (AMBIEN) 10 MG tablet Take 10 mg by mouth at bedtime.     . [DISCONTINUED] mirtazapine (REMERON) 15 MG tablet Take 15 mg by mouth as needed.      . [DISCONTINUED] ranitidine (ZANTAC) 300 MG tablet Take 300 mg by mouth 2 (two) times daily.       No current facility-administered medications on file prior to visit.     Review of Systems  Constitutional: Positive for appetite  change and unexpected weight change.       Poor appetite  Respiratory: Positive for apnea, cough and shortness of breath.   Cardiovascular: Positive for leg swelling.  Gastrointestinal: Positive for nausea.  Skin: Positive for rash.  Hematological: Bruises/bleeds easily.  Psychiatric/Behavioral:       Depression History of SI  All other systems reviewed and are negative.     Objective:   Physical Exam HENT: Normocephalic, Atraumatic Eyes: EOMI, Conj WNL Cardio: S1, S2 normal, regular rhythm.  Bradycardia.  Difficult to palpate distal pulses. Pulm: B/l clear to auscultation.  Effort normal Abd: Soft, non-distended, non-tender, BS+ MSK:  Gait Antalgic.   No TTP along back.    Mild edema LLE.   Neg TTP SI joing Neuro: CN II-XII grossly intact.    Sensation intact to light touch in all LE dermatomes  Reflexes 3+ LLE  Strength  5/5 in all LE myotomes (mildly limited in LLE due to pain) Skin: Scattered abrasion, vascular changes b/l LE Psych: Slowed, tangential. History (not current) of SI     Assessment & Plan:  70 y/o female with pmh of CAD s/p CABG, AICD, CHF, stroke, falls, COPD, tobacco abuse, depression presents with pain in her left back pain.    1. Back pain with radiation to LLE  Pt had EMG 09/2014 showing chronic radiculopathy, mild electrically  Despite vascular changes in LLE, there also appears to be a strong neurologic component.  Will evaluate for neurological causes and consider workup for vascular etiology in the  future.   Will order Xrays of back  Will refer to PT  Will refer to Psychology  Cont tramadol/Hydrocodone per PCP, consider wean/taper  2. Abnormality of gait  With hx of frequent falls  Will refer to PT  Cont 4 point Cane  3. Tobacco abuse  Counseled on importance of abstinence  4. Insomnia  Encouraged pt to stop Ambien as this may be contributing to falls, in addition to caution with narcotics  5. Psych  Would consider referral to Psychiatry  for depression.  Pt would like to follow up with PCP regarding results of Xray and PT Will have pt follow up PRN

## 2015-10-22 ENCOUNTER — Other Ambulatory Visit: Payer: Self-pay | Admitting: Internal Medicine

## 2015-10-22 LAB — TOXASSURE SELECT,+ANTIDEPR,UR: PDF: 0

## 2015-10-24 NOTE — Progress Notes (Signed)
UDS is consistent for prescribed medications.  

## 2015-11-30 ENCOUNTER — Emergency Department (HOSPITAL_COMMUNITY): Payer: Commercial Managed Care - HMO | Admitting: Certified Registered"

## 2015-11-30 ENCOUNTER — Inpatient Hospital Stay (HOSPITAL_COMMUNITY)
Admission: EM | Admit: 2015-11-30 | Discharge: 2015-12-17 | DRG: 252 | Disposition: E | Payer: Commercial Managed Care - HMO | Attending: Internal Medicine | Admitting: Internal Medicine

## 2015-11-30 ENCOUNTER — Encounter (HOSPITAL_COMMUNITY): Payer: Self-pay | Admitting: *Deleted

## 2015-11-30 ENCOUNTER — Emergency Department (HOSPITAL_COMMUNITY): Payer: Commercial Managed Care - HMO

## 2015-11-30 ENCOUNTER — Encounter (HOSPITAL_COMMUNITY): Admission: EM | Disposition: E | Payer: Self-pay | Source: Home / Self Care | Attending: Vascular Surgery

## 2015-11-30 DIAGNOSIS — R079 Chest pain, unspecified: Secondary | ICD-10-CM | POA: Diagnosis not present

## 2015-11-30 DIAGNOSIS — Z515 Encounter for palliative care: Secondary | ICD-10-CM | POA: Diagnosis not present

## 2015-11-30 DIAGNOSIS — I724 Aneurysm of artery of lower extremity: Secondary | ICD-10-CM | POA: Diagnosis present

## 2015-11-30 DIAGNOSIS — Z66 Do not resuscitate: Secondary | ICD-10-CM | POA: Diagnosis not present

## 2015-11-30 DIAGNOSIS — Z79899 Other long term (current) drug therapy: Secondary | ICD-10-CM | POA: Diagnosis not present

## 2015-11-30 DIAGNOSIS — G629 Polyneuropathy, unspecified: Secondary | ICD-10-CM | POA: Diagnosis present

## 2015-11-30 DIAGNOSIS — I5043 Acute on chronic combined systolic (congestive) and diastolic (congestive) heart failure: Secondary | ICD-10-CM | POA: Diagnosis not present

## 2015-11-30 DIAGNOSIS — E876 Hypokalemia: Secondary | ICD-10-CM | POA: Diagnosis not present

## 2015-11-30 DIAGNOSIS — D72829 Elevated white blood cell count, unspecified: Secondary | ICD-10-CM

## 2015-11-30 DIAGNOSIS — N390 Urinary tract infection, site not specified: Secondary | ICD-10-CM | POA: Diagnosis present

## 2015-11-30 DIAGNOSIS — D638 Anemia in other chronic diseases classified elsewhere: Secondary | ICD-10-CM | POA: Diagnosis present

## 2015-11-30 DIAGNOSIS — Y95 Nosocomial condition: Secondary | ICD-10-CM | POA: Diagnosis not present

## 2015-11-30 DIAGNOSIS — I5023 Acute on chronic systolic (congestive) heart failure: Secondary | ICD-10-CM | POA: Diagnosis not present

## 2015-11-30 DIAGNOSIS — I130 Hypertensive heart and chronic kidney disease with heart failure and stage 1 through stage 4 chronic kidney disease, or unspecified chronic kidney disease: Secondary | ICD-10-CM | POA: Diagnosis present

## 2015-11-30 DIAGNOSIS — K529 Noninfective gastroenteritis and colitis, unspecified: Secondary | ICD-10-CM | POA: Diagnosis not present

## 2015-11-30 DIAGNOSIS — D509 Iron deficiency anemia, unspecified: Secondary | ICD-10-CM | POA: Diagnosis present

## 2015-11-30 DIAGNOSIS — R5381 Other malaise: Secondary | ICD-10-CM | POA: Insufficient documentation

## 2015-11-30 DIAGNOSIS — Z6821 Body mass index (BMI) 21.0-21.9, adult: Secondary | ICD-10-CM | POA: Diagnosis not present

## 2015-11-30 DIAGNOSIS — I509 Heart failure, unspecified: Secondary | ICD-10-CM | POA: Diagnosis not present

## 2015-11-30 DIAGNOSIS — K551 Chronic vascular disorders of intestine: Secondary | ICD-10-CM | POA: Diagnosis present

## 2015-11-30 DIAGNOSIS — I10 Essential (primary) hypertension: Secondary | ICD-10-CM | POA: Diagnosis not present

## 2015-11-30 DIAGNOSIS — I209 Angina pectoris, unspecified: Secondary | ICD-10-CM | POA: Insufficient documentation

## 2015-11-30 DIAGNOSIS — Z95810 Presence of automatic (implantable) cardiac defibrillator: Secondary | ICD-10-CM

## 2015-11-30 DIAGNOSIS — R0602 Shortness of breath: Secondary | ICD-10-CM

## 2015-11-30 DIAGNOSIS — J189 Pneumonia, unspecified organism: Secondary | ICD-10-CM | POA: Insufficient documentation

## 2015-11-30 DIAGNOSIS — I2583 Coronary atherosclerosis due to lipid rich plaque: Secondary | ICD-10-CM | POA: Diagnosis present

## 2015-11-30 DIAGNOSIS — N179 Acute kidney failure, unspecified: Secondary | ICD-10-CM | POA: Diagnosis not present

## 2015-11-30 DIAGNOSIS — Z955 Presence of coronary angioplasty implant and graft: Secondary | ICD-10-CM

## 2015-11-30 DIAGNOSIS — Z7982 Long term (current) use of aspirin: Secondary | ICD-10-CM | POA: Diagnosis not present

## 2015-11-30 DIAGNOSIS — I729 Aneurysm of unspecified site: Secondary | ICD-10-CM | POA: Diagnosis not present

## 2015-11-30 DIAGNOSIS — Z8673 Personal history of transient ischemic attack (TIA), and cerebral infarction without residual deficits: Secondary | ICD-10-CM

## 2015-11-30 DIAGNOSIS — J440 Chronic obstructive pulmonary disease with acute lower respiratory infection: Secondary | ICD-10-CM | POA: Diagnosis present

## 2015-11-30 DIAGNOSIS — N183 Chronic kidney disease, stage 3 (moderate): Secondary | ICD-10-CM | POA: Diagnosis present

## 2015-11-30 DIAGNOSIS — I255 Ischemic cardiomyopathy: Secondary | ICD-10-CM | POA: Insufficient documentation

## 2015-11-30 DIAGNOSIS — R1030 Lower abdominal pain, unspecified: Secondary | ICD-10-CM | POA: Diagnosis present

## 2015-11-30 DIAGNOSIS — Z86718 Personal history of other venous thrombosis and embolism: Secondary | ICD-10-CM

## 2015-11-30 DIAGNOSIS — Z85819 Personal history of malignant neoplasm of unspecified site of lip, oral cavity, and pharynx: Secondary | ICD-10-CM

## 2015-11-30 DIAGNOSIS — N182 Chronic kidney disease, stage 2 (mild): Secondary | ICD-10-CM | POA: Diagnosis not present

## 2015-11-30 DIAGNOSIS — R188 Other ascites: Secondary | ICD-10-CM | POA: Diagnosis not present

## 2015-11-30 DIAGNOSIS — D696 Thrombocytopenia, unspecified: Secondary | ICD-10-CM | POA: Diagnosis present

## 2015-11-30 DIAGNOSIS — K219 Gastro-esophageal reflux disease without esophagitis: Secondary | ICD-10-CM | POA: Diagnosis present

## 2015-11-30 DIAGNOSIS — I252 Old myocardial infarction: Secondary | ICD-10-CM | POA: Diagnosis not present

## 2015-11-30 DIAGNOSIS — E782 Mixed hyperlipidemia: Secondary | ICD-10-CM | POA: Diagnosis present

## 2015-11-30 DIAGNOSIS — M549 Dorsalgia, unspecified: Secondary | ICD-10-CM | POA: Diagnosis present

## 2015-11-30 DIAGNOSIS — R1031 Right lower quadrant pain: Secondary | ICD-10-CM

## 2015-11-30 DIAGNOSIS — E860 Dehydration: Secondary | ICD-10-CM | POA: Diagnosis present

## 2015-11-30 DIAGNOSIS — M109 Gout, unspecified: Secondary | ICD-10-CM | POA: Diagnosis present

## 2015-11-30 DIAGNOSIS — F17210 Nicotine dependence, cigarettes, uncomplicated: Secondary | ICD-10-CM | POA: Diagnosis present

## 2015-11-30 DIAGNOSIS — N289 Disorder of kidney and ureter, unspecified: Secondary | ICD-10-CM

## 2015-11-30 DIAGNOSIS — Z7401 Bed confinement status: Secondary | ICD-10-CM | POA: Diagnosis not present

## 2015-11-30 DIAGNOSIS — I9589 Other hypotension: Secondary | ICD-10-CM | POA: Diagnosis present

## 2015-11-30 DIAGNOSIS — Y832 Surgical operation with anastomosis, bypass or graft as the cause of abnormal reaction of the patient, or of later complication, without mention of misadventure at the time of the procedure: Secondary | ICD-10-CM | POA: Diagnosis present

## 2015-11-30 DIAGNOSIS — Z951 Presence of aortocoronary bypass graft: Secondary | ICD-10-CM

## 2015-11-30 DIAGNOSIS — D62 Acute posthemorrhagic anemia: Secondary | ICD-10-CM | POA: Diagnosis not present

## 2015-11-30 DIAGNOSIS — D6489 Other specified anemias: Secondary | ICD-10-CM | POA: Diagnosis present

## 2015-11-30 DIAGNOSIS — N189 Chronic kidney disease, unspecified: Secondary | ICD-10-CM | POA: Diagnosis present

## 2015-11-30 DIAGNOSIS — T82898A Other specified complication of vascular prosthetic devices, implants and grafts, initial encounter: Principal | ICD-10-CM | POA: Diagnosis present

## 2015-11-30 DIAGNOSIS — E43 Unspecified severe protein-calorie malnutrition: Secondary | ICD-10-CM | POA: Diagnosis present

## 2015-11-30 DIAGNOSIS — I110 Hypertensive heart disease with heart failure: Secondary | ICD-10-CM | POA: Diagnosis not present

## 2015-11-30 DIAGNOSIS — R1032 Left lower quadrant pain: Secondary | ICD-10-CM | POA: Diagnosis not present

## 2015-11-30 DIAGNOSIS — I2510 Atherosclerotic heart disease of native coronary artery without angina pectoris: Secondary | ICD-10-CM | POA: Insufficient documentation

## 2015-11-30 DIAGNOSIS — K5900 Constipation, unspecified: Secondary | ICD-10-CM | POA: Diagnosis present

## 2015-11-30 DIAGNOSIS — R1084 Generalized abdominal pain: Secondary | ICD-10-CM | POA: Diagnosis not present

## 2015-11-30 DIAGNOSIS — E8809 Other disorders of plasma-protein metabolism, not elsewhere classified: Secondary | ICD-10-CM | POA: Diagnosis not present

## 2015-11-30 DIAGNOSIS — R109 Unspecified abdominal pain: Secondary | ICD-10-CM | POA: Insufficient documentation

## 2015-11-30 HISTORY — PX: FALSE ANEURYSM REPAIR: SHX5152

## 2015-11-30 LAB — COMPREHENSIVE METABOLIC PANEL
ALBUMIN: 3.4 g/dL — AB (ref 3.5–5.0)
ALK PHOS: 95 U/L (ref 38–126)
ALT: <5 U/L — ABNORMAL LOW (ref 14–54)
ANION GAP: 12 (ref 5–15)
AST: 12 U/L — ABNORMAL LOW (ref 15–41)
BUN: 36 mg/dL — ABNORMAL HIGH (ref 6–20)
CO2: 23 mmol/L (ref 22–32)
Calcium: 9.3 mg/dL (ref 8.9–10.3)
Chloride: 105 mmol/L (ref 101–111)
Creatinine, Ser: 1.98 mg/dL — ABNORMAL HIGH (ref 0.44–1.00)
GFR calc Af Amer: 28 mL/min — ABNORMAL LOW (ref >60)
GFR calc non Af Amer: 25 mL/min — ABNORMAL LOW (ref >60)
GLUCOSE: 111 mg/dL — AB (ref 65–99)
POTASSIUM: 4.9 mmol/L (ref 3.5–5.1)
SODIUM: 140 mmol/L (ref 135–145)
Total Bilirubin: 0.3 mg/dL (ref 0.3–1.2)
Total Protein: 6.7 g/dL (ref 6.5–8.1)

## 2015-11-30 LAB — URINALYSIS, ROUTINE W REFLEX MICROSCOPIC
BILIRUBIN URINE: NEGATIVE
GLUCOSE, UA: NEGATIVE mg/dL
Hgb urine dipstick: NEGATIVE
KETONES UR: NEGATIVE mg/dL
Leukocytes, UA: NEGATIVE
Nitrite: NEGATIVE
PH: 5 (ref 5.0–8.0)
Protein, ur: NEGATIVE mg/dL
Specific Gravity, Urine: 1.016 (ref 1.005–1.030)

## 2015-11-30 LAB — CBC
HEMATOCRIT: 33.8 % — AB (ref 36.0–46.0)
Hemoglobin: 10.8 g/dL — ABNORMAL LOW (ref 12.0–15.0)
MCH: 31.5 pg (ref 26.0–34.0)
MCHC: 32 g/dL (ref 30.0–36.0)
MCV: 98.5 fL (ref 78.0–100.0)
Platelets: 89 10*3/uL — ABNORMAL LOW (ref 150–400)
RBC: 3.43 MIL/uL — ABNORMAL LOW (ref 3.87–5.11)
RDW: 16 % — AB (ref 11.5–15.5)
WBC: 11.9 10*3/uL — ABNORMAL HIGH (ref 4.0–10.5)

## 2015-11-30 LAB — PROTIME-INR
INR: 1.17 (ref 0.00–1.49)
Prothrombin Time: 15.1 seconds (ref 11.6–15.2)

## 2015-11-30 LAB — APTT: APTT: 29 s (ref 24–37)

## 2015-11-30 LAB — LIPASE, BLOOD: LIPASE: 33 U/L (ref 11–51)

## 2015-11-30 SURGERY — REPAIR, PSEUDOANEURYSM
Anesthesia: General | Laterality: Right

## 2015-11-30 MED ORDER — HEPARIN SODIUM (PORCINE) 5000 UNIT/ML IJ SOLN
INTRAMUSCULAR | Status: DC | PRN
  Administered 2015-11-30: 500 mL

## 2015-11-30 MED ORDER — SUFENTANIL CITRATE 50 MCG/ML IV SOLN
INTRAVENOUS | Status: DC | PRN
Start: 2015-11-30 — End: 2015-12-01
  Administered 2015-11-30 (×3): 10 ug via INTRAVENOUS

## 2015-11-30 MED ORDER — DEXAMETHASONE SODIUM PHOSPHATE 4 MG/ML IJ SOLN
INTRAMUSCULAR | Status: DC | PRN
  Administered 2015-11-30: 4 mg via INTRAVENOUS

## 2015-11-30 MED ORDER — SUFENTANIL CITRATE 50 MCG/ML IV SOLN
INTRAVENOUS | Status: AC
  Filled 2015-11-30: qty 1

## 2015-11-30 MED ORDER — LACTATED RINGERS IV SOLN
INTRAVENOUS | Status: DC | PRN
  Administered 2015-11-30 (×2): via INTRAVENOUS

## 2015-11-30 MED ORDER — 0.9 % SODIUM CHLORIDE (POUR BTL) OPTIME
TOPICAL | Status: DC | PRN
  Administered 2015-11-30: 2000 mL

## 2015-11-30 MED ORDER — FENTANYL CITRATE (PF) 100 MCG/2ML IJ SOLN
100.0000 ug | Freq: Once | INTRAMUSCULAR | Status: AC
  Administered 2015-11-30: 100 ug via INTRAVENOUS
  Filled 2015-11-30: qty 2

## 2015-11-30 MED ORDER — GLYCOPYRROLATE 0.2 MG/ML IJ SOLN
INTRAMUSCULAR | Status: DC | PRN
  Administered 2015-11-30: 0.2 mg via INTRAVENOUS

## 2015-11-30 MED ORDER — CEFAZOLIN SODIUM-DEXTROSE 2-3 GM-% IV SOLR
INTRAVENOUS | Status: AC
  Filled 2015-11-30: qty 50

## 2015-11-30 MED ORDER — GLYCOPYRROLATE 0.2 MG/ML IJ SOLN
INTRAMUSCULAR | Status: AC
  Filled 2015-11-30: qty 1

## 2015-11-30 MED ORDER — ONDANSETRON HCL 4 MG/2ML IJ SOLN
4.0000 mg | Freq: Once | INTRAMUSCULAR | Status: AC
  Administered 2015-11-30: 4 mg via INTRAVENOUS
  Filled 2015-11-30: qty 2

## 2015-11-30 MED ORDER — ALBUMIN HUMAN 5 % IV SOLN
INTRAVENOUS | Status: DC | PRN
  Administered 2015-11-30: 23:00:00 via INTRAVENOUS

## 2015-11-30 MED ORDER — ROCURONIUM BROMIDE 50 MG/5ML IV SOLN
INTRAVENOUS | Status: AC
  Filled 2015-11-30: qty 1

## 2015-11-30 MED ORDER — IOHEXOL 300 MG/ML  SOLN
50.0000 mL | Freq: Once | INTRAMUSCULAR | Status: AC | PRN
  Administered 2015-11-30: 50 mL via ORAL

## 2015-11-30 MED ORDER — MIDAZOLAM HCL 5 MG/5ML IJ SOLN
INTRAMUSCULAR | Status: DC | PRN
  Administered 2015-11-30: 2 mg via INTRAVENOUS

## 2015-11-30 MED ORDER — LIDOCAINE HCL (CARDIAC) 20 MG/ML IV SOLN
INTRAVENOUS | Status: AC
  Filled 2015-11-30: qty 5

## 2015-11-30 MED ORDER — SUGAMMADEX SODIUM 200 MG/2ML IV SOLN
INTRAVENOUS | Status: DC | PRN
  Administered 2015-11-30: 100 mg via INTRAVENOUS

## 2015-11-30 MED ORDER — SODIUM CHLORIDE 0.9 % IJ SOLN
INTRAMUSCULAR | Status: AC
  Filled 2015-11-30: qty 10

## 2015-11-30 MED ORDER — HEPARIN SODIUM (PORCINE) 1000 UNIT/ML IJ SOLN
INTRAMUSCULAR | Status: DC | PRN
  Administered 2015-11-30: 6000 [IU] via INTRAVENOUS

## 2015-11-30 MED ORDER — PROPOFOL 10 MG/ML IV BOLUS
INTRAVENOUS | Status: AC
  Filled 2015-11-30: qty 20

## 2015-11-30 MED ORDER — LIDOCAINE HCL (CARDIAC) 20 MG/ML IV SOLN
INTRAVENOUS | Status: DC | PRN
  Administered 2015-11-30: 40 mg via INTRAVENOUS

## 2015-11-30 MED ORDER — ONDANSETRON HCL 4 MG/2ML IJ SOLN
INTRAMUSCULAR | Status: DC | PRN
  Administered 2015-11-30: 4 mg via INTRAVENOUS

## 2015-11-30 MED ORDER — THROMBIN 20000 UNITS EX SOLR
CUTANEOUS | Status: AC
  Filled 2015-11-30: qty 20000

## 2015-11-30 MED ORDER — CEFAZOLIN SODIUM-DEXTROSE 2-3 GM-% IV SOLR
INTRAVENOUS | Status: DC | PRN
  Administered 2015-11-30: 2 g via INTRAVENOUS

## 2015-11-30 MED ORDER — PROTAMINE SULFATE 10 MG/ML IV SOLN
INTRAVENOUS | Status: DC | PRN
  Administered 2015-11-30: 50 mg via INTRAVENOUS

## 2015-11-30 MED ORDER — PROTAMINE SULFATE 10 MG/ML IV SOLN
INTRAVENOUS | Status: AC
  Filled 2015-11-30: qty 5

## 2015-11-30 MED ORDER — SUGAMMADEX SODIUM 200 MG/2ML IV SOLN
INTRAVENOUS | Status: AC
  Filled 2015-11-30: qty 2

## 2015-11-30 MED ORDER — DEXAMETHASONE SODIUM PHOSPHATE 4 MG/ML IJ SOLN
INTRAMUSCULAR | Status: AC
  Filled 2015-11-30: qty 1

## 2015-11-30 MED ORDER — MIDAZOLAM HCL 2 MG/2ML IJ SOLN
INTRAMUSCULAR | Status: AC
  Filled 2015-11-30: qty 2

## 2015-11-30 MED ORDER — HEPARIN SODIUM (PORCINE) 1000 UNIT/ML IJ SOLN
INTRAMUSCULAR | Status: AC
  Filled 2015-11-30: qty 1

## 2015-11-30 MED ORDER — ROCURONIUM BROMIDE 100 MG/10ML IV SOLN
INTRAVENOUS | Status: DC | PRN
  Administered 2015-11-30: 40 mg via INTRAVENOUS

## 2015-11-30 MED ORDER — PROPOFOL 10 MG/ML IV BOLUS
INTRAVENOUS | Status: DC | PRN
  Administered 2015-11-30: 110 mg via INTRAVENOUS

## 2015-11-30 SURGICAL SUPPLY — 63 items
ADH SKN CLS APL DERMABOND .7 (GAUZE/BANDAGES/DRESSINGS) ×1
BANDAGE ESMARK 6X9 LF (GAUZE/BANDAGES/DRESSINGS) IMPLANT
BNDG CMPR 9X6 STRL LF SNTH (GAUZE/BANDAGES/DRESSINGS)
BNDG ESMARK 6X9 LF (GAUZE/BANDAGES/DRESSINGS)
CANISTER SUCTION 2500CC (MISCELLANEOUS) ×3 IMPLANT
CATH EMB 3FR 40CM (CATHETERS) ×2 IMPLANT
CATH EMB 4FR 40CM (CATHETERS) ×2 IMPLANT
CLIP TI MEDIUM 24 (CLIP) ×3 IMPLANT
CLIP TI WIDE RED SMALL 24 (CLIP) ×3 IMPLANT
CUFF TOURNIQUET SINGLE 24IN (TOURNIQUET CUFF) IMPLANT
CUFF TOURNIQUET SINGLE 34IN LL (TOURNIQUET CUFF) IMPLANT
CUFF TOURNIQUET SINGLE 44IN (TOURNIQUET CUFF) IMPLANT
DERMABOND ADVANCED (GAUZE/BANDAGES/DRESSINGS) ×2
DERMABOND ADVANCED .7 DNX12 (GAUZE/BANDAGES/DRESSINGS) IMPLANT
DRAIN CHANNEL 10F 3/8 F FF (DRAIN) ×2 IMPLANT
DRAIN SNY 10X20 3/4 PERF (WOUND CARE) IMPLANT
ELECT REM PT RETURN 9FT ADLT (ELECTROSURGICAL) ×3
ELECTRODE REM PT RTRN 9FT ADLT (ELECTROSURGICAL) ×1 IMPLANT
EVACUATOR SILICONE 100CC (DRAIN) ×2 IMPLANT
GAUZE SPONGE 2X2 8PLY STRL LF (GAUZE/BANDAGES/DRESSINGS) IMPLANT
GLOVE BIO SURGEON STRL SZ 6.5 (GLOVE) ×1 IMPLANT
GLOVE BIO SURGEONS STRL SZ 6.5 (GLOVE) ×1
GLOVE BIOGEL PI IND STRL 6.5 (GLOVE) IMPLANT
GLOVE BIOGEL PI IND STRL 7.0 (GLOVE) IMPLANT
GLOVE BIOGEL PI INDICATOR 6.5 (GLOVE) ×6
GLOVE BIOGEL PI INDICATOR 7.0 (GLOVE) ×2
GLOVE SS BIOGEL STRL SZ 7 (GLOVE) ×1 IMPLANT
GLOVE SUPERSENSE BIOGEL SZ 7 (GLOVE) ×2
GLOVE SURG SS PI 6.5 STRL IVOR (GLOVE) ×6 IMPLANT
GOWN STRL REUS W/ TWL LRG LVL3 (GOWN DISPOSABLE) ×3 IMPLANT
GOWN STRL REUS W/TWL LRG LVL3 (GOWN DISPOSABLE) ×12
GRAFT HEMASHIELD 8MM (Vascular Products) ×3 IMPLANT
GRAFT VASC STRG 30X8KNIT (Vascular Products) IMPLANT
INSERT FOGARTY SM (MISCELLANEOUS) ×4 IMPLANT
KIT BASIN OR (CUSTOM PROCEDURE TRAY) ×3 IMPLANT
KIT ROOM TURNOVER OR (KITS) ×3 IMPLANT
LIQUID BAND (GAUZE/BANDAGES/DRESSINGS) ×3 IMPLANT
LOOP VESSEL MAXI BLUE (MISCELLANEOUS) ×4 IMPLANT
NS IRRIG 1000ML POUR BTL (IV SOLUTION) ×6 IMPLANT
PACK CV ACCESS (CUSTOM PROCEDURE TRAY) ×2 IMPLANT
PACK PERIPHERAL VASCULAR (CUSTOM PROCEDURE TRAY) ×1 IMPLANT
PAD ARMBOARD 7.5X6 YLW CONV (MISCELLANEOUS) ×6 IMPLANT
PADDING CAST COTTON 6X4 STRL (CAST SUPPLIES) IMPLANT
SPONGE GAUZE 2X2 STER 10/PKG (GAUZE/BANDAGES/DRESSINGS) ×2
SPONGE INTESTINAL PEANUT (DISPOSABLE) ×2 IMPLANT
STOPCOCK 4 WAY LG BORE MALE ST (IV SETS) ×4 IMPLANT
SUT PROLENE 5 0 C 1 24 (SUTURE) ×1 IMPLANT
SUT PROLENE 5 0 CC1 (SUTURE) ×4 IMPLANT
SUT PROLENE 6 0 BV (SUTURE) IMPLANT
SUT PROLENE 6 0 CC (SUTURE) ×2 IMPLANT
SUT SILK 2 0 FS (SUTURE) ×2 IMPLANT
SUT VIC AB 2-0 CT1 27 (SUTURE) ×3
SUT VIC AB 2-0 CT1 TAPERPNT 27 (SUTURE) IMPLANT
SUT VIC AB 2-0 CTX 36 (SUTURE) ×1 IMPLANT
SUT VIC AB 3-0 SH 27 (SUTURE) ×3
SUT VIC AB 3-0 SH 27X BRD (SUTURE) ×1 IMPLANT
SYR 20CC LL (SYRINGE) ×2 IMPLANT
SYR 3ML LL SCALE MARK (SYRINGE) ×4 IMPLANT
SYR TB 1ML LUER SLIP (SYRINGE) ×2 IMPLANT
TAPE CLOTH SURG 4X10 WHT LF (GAUZE/BANDAGES/DRESSINGS) ×2 IMPLANT
TRAY FOLEY W/METER SILVER 16FR (SET/KITS/TRAYS/PACK) ×1 IMPLANT
UNDERPAD 30X30 INCONTINENT (UNDERPADS AND DIAPERS) ×3 IMPLANT
WATER STERILE IRR 1000ML POUR (IV SOLUTION) ×3 IMPLANT

## 2015-11-30 NOTE — Anesthesia Preprocedure Evaluation (Addendum)
Anesthesia Evaluation  Patient identified by MRN, date of birth, ID band Patient awake    Reviewed: Allergy & Precautions, NPO status , Patient's Chart, lab work & pertinent test results  History of Anesthesia Complications Negative for: history of anesthetic complications  Airway Mallampati: II  TM Distance: >3 FB Neck ROM: Full    Dental  (+) Edentulous Upper, Edentulous Lower   Pulmonary shortness of breath, COPD, Current Smoker,    + rhonchi        Cardiovascular hypertension, + angina + CAD, + Past MI, + Cardiac Stents, + Peripheral Vascular Disease and +CHF  + Cardiac Defibrillator  Rhythm:Regular     Neuro/Psych  Headaches, Depression  Neuromuscular disease CVA, Residual Symptoms    GI/Hepatic Neg liver ROS, GERD  Medicated and Controlled,  Endo/Other    Renal/GU Renal InsufficiencyRenal disease     Musculoskeletal   Abdominal   Peds  Hematology  (+) anemia ,   Anesthesia Other Findings   Reproductive/Obstetrics                            Anesthesia Physical Anesthesia Plan  ASA: III  Anesthesia Plan: General   Post-op Pain Management:    Induction: Intravenous  Airway Management Planned: Oral ETT  Additional Equipment: None  Intra-op Plan:   Post-operative Plan: Extubation in OR  Informed Consent: I have reviewed the patients History and Physical, chart, labs and discussed the procedure including the risks, benefits and alternatives for the proposed anesthesia with the patient or authorized representative who has indicated his/her understanding and acceptance.   Dental advisory given  Plan Discussed with: CRNA and Surgeon  Anesthesia Plan Comments:         Anesthesia Quick Evaluation

## 2015-11-30 NOTE — ED Provider Notes (Signed)
CSN: [id number]     Arrival date & time 12/16/2015  1204 History   First MD Initiated Contact with Patient 12/01/2015 1622     Chief Complaint  Patient presents with  . Groin Pain  . Back Pain     Patient is a 70 y.o. female presenting with groin pain and back pain. The history is provided by the patient.  Groin Pain This is a new problem. The current episode started more than 2 days ago. The problem occurs daily. The problem has been gradually worsening. Associated symptoms include chest pain and abdominal pain. Pertinent negatives include no shortness of breath. Associated symptoms comments: Brief CP . Exacerbated by: palpation. Nothing relieves the symptoms. She has tried rest for the symptoms. The treatment provided no relief.  Back Pain Associated symptoms: abdominal pain and chest pain   Associated symptoms: no dysuria, no fever and no weakness   pt presents with right groin pain She has h/o peripheral vascular disease with h/o ax-fem bypass H/o CAD She reports worsening pain in right lower abdomen/groin No trauma No dysuria No fever She has had some back pain  She reports nausea Seen by PCP today who sent her for evaluation for possible hernia  Past Medical History  Diagnosis Date  . PVD (peripheral vascular disease) ([place])     status post prior femoral-popliteal bypass  . Back pain   . Arthritis   . CAD (coronary artery disease)     status post prior stenting to the proximal RCA, status post anterior STEMI 10/2008 (RCA occluded at the prior stent, LAD treated with a bare metal stent and a drug-eluting stent),  . Chronic systolic heart failure (HCC)     echo 11/2011: EF 25-30%, AS and apical AK, trivial AI, mild MR, PASP 34.  . Anemia   . Ischemic cardiomyopathy   . AICD (automatic cardioverter/defibrillator) present   . HLD (hyperlipidemia)   . GERD (gastroesophageal reflux disease)   . RLS (restless legs syndrome)   . Depression   . Tobacco abuse   . Pain in joint,  lower leg   . Depressive disorder, not elsewhere classified   . Acute myocardial infarction of other anterior wall, subsequent episode of care 2010  . CHF (congestive heart failure) ([place])   . COPD (chronic obstructive pulmonary disease) ([place])   . DVT (deep venous thrombosis) ([place])   . Mixed hyperlipidemia   . Gout   . Atherosclerosis of native arteries of the extremities with intermittent claudication   . Osteoarthritis   . Eczema   . Fall at home July 5,  and  Oct.  2015  . Cancer (HCC)     Skin  of Left  Face- " upper eye"5/15  . Hypertension   . Anginal pain ([place])   . Shortness of breath dyspnea     with exertion  . Chronic kidney disease     "years ago, saw Dr. Mcarthur Rossetti"  "no one has said anything recently"  . Stroke [facility])     before Heart attack, 2010  . Neuropathy, idiopathic     peripherial   Past Surgical History  Procedure Laterality Date  . Psychologist, forensic    . Foot surgery Bilateral     removed nrves off bottomof feet."  . Pr vein bypass graft,aorto-fem-pop      fem fem  04/07/09  . Abdominal hysterectomy    . Cardiac defibrillator placement    . Eye surgery  2013    Cataract bilateral   .  Axillary-femoral bypass graft Right 06/29/2013    Procedure: BYPASS GRAFT AXILLA-RIGHT FEMORAL;  Surgeon: Rosetta Posner, MD;  Location: [place];  Service: Vascular;  Laterality: Right;  . Femoral-tibial bypass graft  2002  . Femoral-femoral bypass graft  03/2009    S/P left to right fem-fem bypass graft  . Abdominal aortagram Bilateral 06/26/2013    Procedure: ABDOMINAL AORTAGRAM;  Surgeon: Elam Dutch, MD;  Location: [facility] CATH LAB;  Service: Cardiovascular;  Laterality: Bilateral;  . Colonoscopy    . Cleft lip repair N/A 05/10/2015    Procedure: Karapandandzic flap to lower lip ;  Surgeon: Irene Limbo, MD;  Location: [place];  Service: Plastics;  Laterality: N/A;   Family History  Problem Relation Age of Onset  . Peripheral vascular disease Sister   . AAA (abdominal aortic  aneurysm) Sister   . Diabetes Mother   . Varicose Veins Mother   . Peripheral vascular disease Mother   . Hypertension Mother   . Heart attack Father     Massive Heart Attack  . CAD Father    Social History  Substance Use Topics  . Smoking status: Current Every Day Smoker -- 1.00 packs/day for 55 years    Types: Cigarettes  . Smokeless tobacco: Never Used     Comment: 05/09/15- "I've smoked 1 in the past 3 days"  "Tried to smoke"  . Alcohol Use: No   OB History    No data available     Review of Systems  Constitutional: Negative for fever.  Respiratory: Negative for shortness of breath.   Cardiovascular: Positive for chest pain.  Gastrointestinal: Positive for abdominal pain.  Genitourinary: Negative for dysuria.  Musculoskeletal: Positive for back pain.  Neurological: Negative for weakness.  All other systems reviewed and are negative.     Allergies  Morphine; Gabapentin; Naproxen; and Prednisone  Home Medications   Prior to Admission medications   Medication Sig Start Date End Date Taking? Authorizing Provider  allopurinol (ZYLOPRIM) 100 MG tablet Take 100 mg by mouth daily.  09/28/13   Historical Provider, MD  aspirin EC 81 MG tablet Take 81 mg by mouth daily.    Historical Provider, MD  Cholecalciferol (VITAMIN D) 1000 UNITS capsule Take 1,000 Units by mouth daily.      Historical Provider, MD  clonazePAM (KLONOPIN) 1 MG tablet Take 2 mg by mouth at bedtime.     Historical Provider, MD  estrogens, conjugated, (PREMARIN) 1.25 MG tablet Take 1.25 mg by mouth daily.    Historical Provider, MD  ezetimibe (ZETIA) 10 MG tablet Take 10 mg by mouth daily.    Historical Provider, MD  ferrous sulfate 325 (65 FE) MG tablet Take 325 mg by mouth daily with breakfast.      Historical Provider, MD  furosemide (LASIX) 20 MG tablet TAKE 1&1/2 TABLETS BY MOUTH DAILY 10/24/15   Evans Lance, MD  HYDROcodone-acetaminophen [facility]) 10-325 MG tablet Take 1 tablet by mouth every 6 (six)  hours as needed (pain).  06/29/15   Historical Provider, MD  ketoconazole (NIZORAL) 2 % cream  09/28/15   Historical Provider, MD  KLOR-CON M20 20 MEQ tablet TAKE 1 TABLET BY MOUTH ON MONDAYS,WEDNESDAYS, AND FRIDAYS 09/30/15   Evans Lance, MD  meclizine (ANTIVERT) 12.5 MG tablet Take 12.5 mg by mouth 3 (three) times daily as needed for dizziness.    Historical Provider, MD  metoprolol succinate (TOPROL-XL) 50 MG 24 hr tablet Take 1 tablet (50 mg total) by mouth daily.  Take with or immediately following a meal. 06/10/15   Evans Lance, MD  nitroGLYCERIN (NITROSTAT) 0.4 MG SL tablet Place 0.4 mg under the tongue every 5 (five) minutes as needed for chest pain.    Historical Provider, MD  pantoprazole (PROTONIX) 40 MG tablet Take 40 mg by mouth 2 (two) times daily.     Historical Provider, MD  PRESCRIPTION MEDICATION Apply 1 application topically daily as needed (itching). Compounded cream:  Ketoconazole 2% cream/ fluticasone 0.05% cream 1:1    Historical Provider, MD  ramipril (ALTACE) 2.5 MG capsule TAKE ONE CAPSULE BY MOUTH EVERY DAY 10/21/14   Evans Lance, MD  traMADol (ULTRAM) 50 MG tablet Take 1 tablet by mouth daily as needed. Pain 09/21/15   Historical Provider, MD  zolpidem (AMBIEN) 10 MG tablet Take 10 mg by mouth at bedtime.     Historical Provider, MD   BP 118/55 mmHg  Pulse 51  Temp(Src) 97.4 F (36.3 C) (Oral)  Resp 14  SpO2 98% Physical Exam CONSTITUTIONAL: Frail, elderly HEAD: Normocephalic/atraumatic EYES: EOMI ENMT: Mucous membranes moist NECK: supple no meningeal signs SPINE/BACK:entire spine nontender CV: S1/S2 noted, no murmurs/rubs/gallops noted LUNGS: Lungs are clear to auscultation bilaterally, no apparent distress ABDOMEN: soft, tenderness in RLQ/right inguinal region.  No large hernia noted. She does have some soft tissue swelling in that region, no erythema/warmth noted.  Femoral pulse intact, thrill noted.  No other focal ABD tenderness noted GU:no cva  tenderness NEURO: Pt is awake/alert/appropriate, moves all extremitiesx4.  No facial droop.   EXTREMITIES:full ROM, distal popliteal pulses noted SKIN: warm, color normal PSYCH: no abnormalities of mood noted, alert and oriented to situation  ED Course  Procedures  5:03 PM Pt with groin tenderness She was sent for Ct imaging Due to GFR, unable to give IV contrast Could be hernia, but could also be related to her vascular disease (h/o ax-fem bypass) Will proceed with CT imaging 8:32 PM CT shows aneurysm of femoral graft This is an acute issue due to pain Distal pulses intact D/w dr Kellie Simmering, requests coags/CXR and he will see in the ED Patient/family updated on plan Labs Review Labs Reviewed  COMPREHENSIVE METABOLIC PANEL - Abnormal; Notable for the following:    Glucose, Bld 111 (*)    BUN 36 (*)    Creatinine, Ser 1.98 (*)    Albumin 3.4 (*)    AST 12 (*)    ALT <5 (*)    GFR calc non Af Amer 25 (*)    GFR calc Af Amer 28 (*)    All other components within normal limits  CBC - Abnormal; Notable for the following:    WBC 11.9 (*)    RBC 3.43 (*)    Hemoglobin 10.8 (*)    HCT 33.8 (*)    RDW 16.0 (*)    Platelets 89 (*)    All other components within normal limits  LIPASE, BLOOD  URINALYSIS, ROUTINE W REFLEX MICROSCOPIC (NOT AT [facility])    Imaging Review Ct Abdomen Pelvis Wo Contrast  12/02/2015  CLINICAL DATA:  Right groin pain and swelling, initial encounter EXAM: CT ABDOMEN AND PELVIS WITHOUT CONTRAST TECHNIQUE: Multidetector CT imaging of the abdomen and pelvis was performed following the standard protocol without IV contrast. COMPARISON:  07/09/2007 FINDINGS: Lung bases are free of acute infiltrate or sizable effusion. The gallbladder has been surgically removed. The liver, spleen, adrenal glands and pancreas are within normal limits. The kidneys are well visualized without renal calculi. A  cortical calcification is noted in the left kidney. The abdominal aorta is dilated  to 3.1 cm. There are changes consistent with a right axillobifemoral bypass graft. 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 The actual degree and patency within the aneurysmal dilatation cannot be assessed on this exam due to the lack of IV contrast. The femoral crossover graft appears within normal limits. The bladder is well distended. No pelvic mass lesion or sidewall abnormality is noted. The appendix is not well visualized. The osseous structures are within normal limits. 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 The degree of patency of the aneurysmal dilatation is uncertain as no contrast was administered for this exam. Ultrasound may be helpful for further evaluation. Vascular surgery consultation recommended. Dilatation of the abdominal aorta to 3 cm. Recommend followup by ultrasound in 3 years. This recommendation follows ACR consensus guidelines: White Paper of the ACR Incidental Findings Committee II on Vascular Findings. Natasha Mead Coll Radiol 2013; 10:[id number] Electronically Signed   By: Inez Catalina M.D.   On:  19:38   Dg Chest Portable 1 View  11/23/2015  CLINICAL DATA:  Chest pain for 1 day EXAM: PORTABLE CHEST 1 VIEW COMPARISON:  Chest x-ray dated 09/15/2013. FINDINGS: Mild cardiomegaly is stable. Left chest wall pacemaker/AICD stable in position. Atherosclerotic changes again noted at the aortic arch. Overall cardiomediastinal silhouette is stable in size and configuration. Lungs are clear. No evidence of pneumonia. No pleural effusion. No pneumothorax seen. Osseous structures about the chest are unremarkable. IMPRESSION: Stable chest x-ray. Stable mild cardiomegaly. Lungs are clear and there is no evidence of acute cardiopulmonary abnormality. Electronically Signed   By: Franki Cabot M.D.   On: 12/16/2015 20:18   I have  personally reviewed and evaluated these images and lab results as part of my medical decision-making.   EKG Interpretation   Date/Time:  Wednesday November 30 2015 16:49:36 EDT Ventricular Rate:  55 PR Interval:  153 QRS Duration: 138 QT Interval:  514 QTC Calculation: 492 R Axis:   -51 Text Interpretation:  Sinus arrhythmia Probable left atrial enlargement  Left bundle branch block Confirmed by Christy Gentles  MD, Elenore Rota ([id number]) on  11/18/2015 5:00:02 PM     Medications  fentaNYL (SUBLIMAZE) injection 100 mcg (100 mcg Intravenous Given 12/06/2015 1704)  ondansetron (ZOFRAN) injection 4 mg (4 mg Intravenous Given 11/22/2015 1704)  iohexol (OMNIPAQUE) 300 MG/ML solution 50 mL (50 mLs Oral Contrast Given 12/11/2015 1650)    MDM   Final diagnoses:  Right groin pain  Renal insufficiency    Nursing notes including past medical history and social history reviewed and considered in documentation xrays/imaging reviewed by myself and considered during evaluation Labs/vital reviewed myself and considered during evaluation Previous records reviewed and considered     Ripley Fraise, MD  [phone]

## 2015-11-30 NOTE — ED Notes (Signed)
Dr. Kenton Kingfisher sent patient by private vehicle to be seen for femoral hernia.

## 2015-11-30 NOTE — ED Notes (Signed)
Pt sent here by pcp due to possible hernia. Having pain and swelling to right groin. Also reports nausea and back pain.

## 2015-11-30 NOTE — H&P (Signed)
Vascular Surgery H&P  Chief Complaint: Pain in right inguinal area over past 3 days  HPI: [name] is a 70 y.o. female who presents for evaluation of pain in right inguinal area. Patient noted a "knot" in the right inguinal area were previous bypasses have been performed and this has slowly enlarged over the last several days. She has been having increasing discomfort over the past 3 days. She continues to have chronic back pain which is not new. She denies any change or claudication symptoms. She continues to have a heavy tobacco abuse problem smoking 1 pack per day. She has previous history of a femoral-femoral bypass graft and bilateral infrainguinal bypass grafts. She then underwent a right axillofemoral bypass graft by Dr. early in 2014. Her ABIs have been approximately 1.0 during follow-up in our office most recently a few months ago. She also has an AICD which is followed by Dr. Crissie Sickles and he saw her in January 2017.   Past Medical History  Diagnosis Date  . PVD (peripheral vascular disease) ([place])     status post prior femoral-popliteal bypass  . Back pain   . Arthritis   . CAD (coronary artery disease)     status post prior stenting to the proximal RCA, status post anterior STEMI 10/2008 (RCA occluded at the prior stent, LAD treated with a bare metal stent and a drug-eluting stent),  . Chronic systolic heart failure (HCC)     echo 11/2011: EF 25-30%, AS and apical AK, trivial AI, mild MR, PASP 34.  . Anemia   . Ischemic cardiomyopathy   . AICD (automatic cardioverter/defibrillator) present   . HLD (hyperlipidemia)   . GERD (gastroesophageal reflux disease)   . RLS (restless legs syndrome)   . Depression   . Tobacco abuse   . Pain in joint, lower leg   . Depressive disorder, not elsewhere classified   . Acute myocardial infarction of other anterior wall, subsequent episode of care 2010  . CHF (congestive heart failure) ([place])   . COPD (chronic obstructive pulmonary  disease) ([place])   . DVT (deep venous thrombosis) ([place])   . Mixed hyperlipidemia   . Gout   . Atherosclerosis of native arteries of the extremities with intermittent claudication   . Osteoarthritis   . Eczema   . Fall at home July 5,  and  Oct.  2015  . Cancer (HCC)     Skin  of Left  Face- " upper eye"5/15  . Hypertension   . Anginal pain ([place])   . Shortness of breath dyspnea     with exertion  . Chronic kidney disease     "years ago, saw Dr. Mcarthur Rossetti"  "no one has said anything recently"  . Stroke [facility])     before Heart attack, 2010  . Neuropathy, idiopathic     peripherial   Past Surgical History  Procedure Laterality Date  . Psychologist, forensic    . Foot surgery Bilateral     removed nrves off bottomof feet."  . Pr vein bypass graft,aorto-fem-pop      fem fem  04/07/09  . Abdominal hysterectomy    . Cardiac defibrillator placement    . Eye surgery  2013    Cataract bilateral   . Axillary-femoral bypass graft Right 06/29/2013    Procedure: BYPASS GRAFT AXILLA-RIGHT FEMORAL;  Surgeon: Rosetta Posner, MD;  Location: [place];  Service: Vascular;  Laterality: Right;  . Femoral-tibial bypass graft  2002  . Femoral-femoral bypass graft  03/2009    S/P left to right fem-fem bypass graft  . Abdominal aortagram Bilateral 06/26/2013    Procedure: ABDOMINAL AORTAGRAM;  Surgeon: Elam Dutch, MD;  Location: [facility] CATH LAB;  Service: Cardiovascular;  Laterality: Bilateral;  . Colonoscopy    . 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 Marital Status: Married    Spouse Name: N/A  . Number of Children: N/A  . Years of Education: N/A   Social History Main Topics  . Smoking status: Current Every Day Smoker -- 1.00 packs/day for 55 years    Types: Cigarettes  . Smokeless tobacco: Never Used     Comment: 05/09/15- "I've smoked 1 in the  past 3 days"  "Tried to smoke"  . Alcohol Use: No  . Drug Use: No  . Sexual Activity: Not Asked   Other Topics Concern  . None   Social History Narrative   Family History  Problem Relation Age of Onset  . Peripheral vascular disease Sister   . AAA (abdominal aortic aneurysm) Sister   . Diabetes Mother   . Varicose Veins Mother   . Peripheral vascular disease Mother   . Hypertension Mother   . Heart attack Father     Massive Heart Attack  . CAD Father    Allergies  Allergen Reactions  . Morphine     Other reaction(s): Other (See Comments) hallucinations REACTION: makes me crazy  . Gabapentin Other (See Comments)    300 mg upsets her stomach and 100 mg affect her sleep  . Naproxen Other (See Comments)    Blurred vision  . Prednisone Other (See Comments)    cranky   Prior to Admission medications   Medication Sig Start Date End Date Taking? Authorizing Provider  allopurinol (ZYLOPRIM) 100 MG tablet Take 100 mg by mouth daily.  09/28/13  Yes Historical Provider, MD  aspirin EC 81 MG tablet Take 81 mg by mouth daily.   Yes Historical Provider, MD  Cholecalciferol (VITAMIN D) 1000 UNITS capsule Take 1,000 Units by mouth daily.     Yes Historical Provider, MD  clonazePAM (KLONOPIN) 1 MG tablet Take 2 mg by mouth at bedtime.    Yes Historical Provider, MD  estrogens, conjugated, (PREMARIN) 1.25 MG tablet Take 1.25 mg by mouth daily.   Yes Historical Provider, MD  ezetimibe (ZETIA) 10 MG tablet Take 10 mg by mouth daily.   Yes Historical Provider, MD  ferrous sulfate 325 (65 FE) MG tablet Take 325 mg by mouth daily with breakfast.     Yes Historical Provider, MD  furosemide (LASIX) 20 MG tablet Take 30 mg by mouth daily.   Yes Historical Provider, MD  HYDROcodone-acetaminophen (NORCO) 10-325 MG tablet Take 1 tablet by mouth every 6 (six) hours as needed (pain).  06/29/15  Yes Historical Provider, MD  ketoconazole (NIZORAL) 2 % cream Apply 1 application topically daily.  09/28/15   Yes Historical Provider, MD  KLOR-CON M20 20 MEQ tablet TAKE 1 TABLET BY MOUTH ON MONDAYS,WEDNESDAYS, AND FRIDAYS 09/30/15  Yes Evans Lance, MD  meclizine (ANTIVERT) 12.5 MG tablet Take 12.5 mg by mouth 3 (three) times daily as needed for dizziness.   Yes Historical Provider, MD  metoprolol succinate (TOPROL-XL) 50 MG 24 hr tablet Take 1 tablet (50 mg total) by mouth daily. Take  with or immediately following a meal. 06/10/15  Yes Evans Lance, MD  nitroGLYCERIN (NITROSTAT) 0.4 MG SL tablet Place 0.4 mg under the tongue every 5 (five) minutes as needed for chest pain.   Yes Historical Provider, MD  pantoprazole (PROTONIX) 40 MG tablet Take 40 mg by mouth 2 (two) times daily.    Yes Historical Provider, MD  PRESCRIPTION MEDICATION Apply 1 application topically daily as needed (itching). Compounded cream:  Ketoconazole 2% cream/ fluticasone 0.05% cream 1:1   Yes Historical Provider, MD  ramipril (ALTACE) 2.5 MG capsule TAKE ONE CAPSULE BY MOUTH EVERY DAY 10/21/14  Yes Evans Lance, MD  traMADol (ULTRAM) 50 MG tablet Take 1 tablet by mouth daily as needed. Pain 09/21/15  Yes Historical Provider, MD  zolpidem (AMBIEN) 10 MG tablet Take 10 mg by mouth at bedtime.    Yes Historical Provider, MD  furosemide (LASIX) 20 MG tablet TAKE 1&1/2 TABLETS BY MOUTH DAILY Patient not taking: Reported on 12/01/2015 10/24/15   Evans Lance, MD     Positive ROS: Denies active chest pain but does have chronic COPD with dyspnea on exertion. Continues with heavy tobacco abuse. Patient also has history of ischemic cardiomyopathy. She had a STEMI in 2010 and had a stent placed at that time. Also has history of hyperlipidemia, GERD, and neuropathy. Chronic back pain. Other systems negative and a complete review of systems  All other systems have been reviewed and were otherwise negative with the exception of those mentioned in the HPI and as above.  Physical Exam: Filed Vitals:   12/09/2015 2000 11/29/2015 2015  BP: 118/76  117/74  Pulse: 59 57  Temp:    Resp: 19 14    General: Alert, no acute distress HEENT: Normal for age Cardiovascular: Regular rate and rhythm. Carotid pulses 2+, no bruits audible Respiratory: Clear to auscultation. No cyanosis, no use of accessory musculature GI: No organomegaly, abdomen is soft and non-tender Skin: No lesions in the area of chief complaint Neurologic: Sensation intact distally Psychiatric: Patient is competent for consent with normal mood and affect Musculoskeletal: No obvious deformities Extremities: 3+ pulse in right axillofemoral and right-to-left femoral-femoral bypass. 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 2-3+ popliteal pulses palpable bilaterally. 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

## 2015-11-30 NOTE — Op Note (Signed)
OPERATIVE REPORT  Date of Surgery: 12/08/2015  Surgeon: Tinnie Gens, MD  Assistant: Silva Bandy PA  Pre-op Diagnosis: expanding right femoral false anerysm-status post right axillobifemoral bypass  Post-op Diagnosis: expanding right femoral false anerysm-status post right axillobifemoral bypass  Procedure: Procedure(s): Resection of Right Femoral Anastamotic Pseudoaneurysm with Dacron Patch Angioplasy Right Common Femoral Artery   Anesthesia: General  EBL: [id number]  Complications: None  Procedure Details: The patient was taken to the operative placed in supine position at which time satisfactory general endotracheal anesthesia was ministered the right leg and both inguinal areas were prepped with Betadine scrub and solution draped in routine sterile manner. After appropriate timeout was called a longitudinal incision was made through the previous scar in the right groin area there down through subcutaneous tissue. Proximal control of the right axillofemoral graft was obtained with circumferential control of the 8 mm graft. There was also a right to left femoral-femoral graft which was circumferentially controlled. Both adnexal and pulse. There was a large pseudoaneurysm measuring about 4 and half centimeters in diameter with the defect being primarily anteriorly and laterally and seem to be coming off the common femoral artery rather than the graft to graft anastomosis. Control of the native superficial femoral artery and profunda femoris arteries was was obtained distally. The great saphenous vein was dissected free and was still present and injury to this was avoided. Patient was then heparinized axillofemoral graft femoral-femoral graft and superficial femoral artery and the profunda femoris artery L occluded with vascular clamps. Longitudinal opening made in the pseudoaneurysm anterolaterally there was a large amount of laminated thrombus within the aneurysm sac. 2 other vessels including the  proximal common femoral artery were controlled from within with blue and Fogarty catheters. The pseudoaneurysm basically had occurred by a defect at the anastomosis to the common femoral artery graft having retracted proximally. The pseudoaneurysm wall was completely excised and circled frontal fashion down to good tissue and the common femoral artery. 4 dilator easily passed down the superficial femoral artery and there was good backbleeding. Following this a piece of 8 mm Hemashield Dacron graft was fashioned into a patch and was anastomosed with 5-0 Prolene to the previous Dacron which was actually the femoral-femoral graft which had been anastomosed to the common femoral artery. The axillofemoral graft had been attached to the femoral-femoral graft and this was all intact. The Dacron patch was then sewn into place with 5-0 Prolene and prior to completion of this the balloon catheters were removed following appropriate antegrade and retrograde flushing this is completed clamps released there was excellent pulse in all limbs of the graft and good palpable popliteal pulse in the right leg. There was audible posterior tibial flow in the ankle. Protamine was then given to reverse the heparin. The patient did continue to use as if he were on anticoagulant but only was on aspirin. Jackson-Pratt drain was brought out than in fairly based stab wound secured with a silk suture and the wound closed in layers with Vicryl and subcuticular fashion with Dermabond patient taken to recovery in satisfactory condition   Tinnie Gens, MD 12/16/2015 11:44 PM

## 2015-11-30 NOTE — Anesthesia Procedure Notes (Signed)
Procedure Name: Intubation Date/Time:  9:41 PM Performed by: Claris Che Pre-anesthesia Checklist: Patient identified, Timeout performed, Emergency Drugs available, Suction available and Patient being monitored Patient Re-evaluated:Patient Re-evaluated prior to inductionOxygen Delivery Method: Circle system utilized Preoxygenation: Pre-oxygenation with 100% oxygen Intubation Type: IV induction Ventilation: Mask ventilation without difficulty Laryngoscope Size: Mac and 3 Grade View: Grade I Tube size: 7.5 mm Number of attempts: 1 Airway Equipment and Method: Stylet Placement Confirmation: ETT inserted through vocal cords under direct vision,  positive ETCO2 and breath sounds checked- equal and bilateral Secured at: 22 cm Tube secured with: Tape Dental Injury: Teeth and Oropharynx as per pre-operative assessment

## 2015-12-01 ENCOUNTER — Encounter (HOSPITAL_COMMUNITY): Payer: Self-pay | Admitting: Vascular Surgery

## 2015-12-01 DIAGNOSIS — I724 Aneurysm of artery of lower extremity: Secondary | ICD-10-CM | POA: Diagnosis present

## 2015-12-01 DIAGNOSIS — I255 Ischemic cardiomyopathy: Secondary | ICD-10-CM | POA: Insufficient documentation

## 2015-12-01 DIAGNOSIS — I2510 Atherosclerotic heart disease of native coronary artery without angina pectoris: Secondary | ICD-10-CM

## 2015-12-01 DIAGNOSIS — I2583 Coronary atherosclerosis due to lipid rich plaque: Secondary | ICD-10-CM

## 2015-12-01 DIAGNOSIS — I209 Angina pectoris, unspecified: Secondary | ICD-10-CM | POA: Insufficient documentation

## 2015-12-01 LAB — CBC
HEMATOCRIT: 26.9 % — AB (ref 36.0–46.0)
HEMATOCRIT: 33.9 % — AB (ref 36.0–46.0)
HEMOGLOBIN: 11.7 g/dL — AB (ref 12.0–15.0)
Hemoglobin: 8.3 g/dL — ABNORMAL LOW (ref 12.0–15.0)
MCH: 30.4 pg (ref 26.0–34.0)
MCH: 31.1 pg (ref 26.0–34.0)
MCHC: 30.9 g/dL (ref 30.0–36.0)
MCHC: 34.5 g/dL (ref 30.0–36.0)
MCV: 98.5 fL (ref 78.0–100.0)
MCV: 90.2 fL (ref 78.0–100.0)
Platelets: 91 10*3/uL — ABNORMAL LOW (ref 150–400)
Platelets: 104 10*3/uL — ABNORMAL LOW (ref 150–400)
RBC: 2.73 MIL/uL — ABNORMAL LOW (ref 3.87–5.11)
RBC: 3.76 MIL/uL — ABNORMAL LOW (ref 3.87–5.11)
RDW: 15.7 % — AB (ref 11.5–15.5)
RDW: 16.3 % — AB (ref 11.5–15.5)
WBC: 11.4 10*3/uL — AB (ref 4.0–10.5)
WBC: 20.5 10*3/uL — AB (ref 4.0–10.5)

## 2015-12-01 LAB — BASIC METABOLIC PANEL
ANION GAP: 10 (ref 5–15)
BUN: 30 mg/dL — AB (ref 6–20)
CALCIUM: 8.9 mg/dL (ref 8.9–10.3)
CO2: 24 mmol/L (ref 22–32)
Chloride: 103 mmol/L (ref 101–111)
Creatinine, Ser: 1.46 mg/dL — ABNORMAL HIGH (ref 0.44–1.00)
GFR calc Af Amer: 41 mL/min — ABNORMAL LOW (ref >60)
GFR calc non Af Amer: 36 mL/min — ABNORMAL LOW (ref >60)
GLUCOSE: 125 mg/dL — AB (ref 65–99)
Potassium: 5 mmol/L (ref 3.5–5.1)
Sodium: 137 mmol/L (ref 135–145)

## 2015-12-01 LAB — TROPONIN I: Troponin I: 0.03 ng/mL (ref <0.031)

## 2015-12-01 LAB — PREPARE RBC (CROSSMATCH)

## 2015-12-01 LAB — MRSA PCR SCREENING: MRSA BY PCR: NEGATIVE

## 2015-12-01 MED ORDER — ACETAMINOPHEN 160 MG/5ML PO SOLN
325.0000 mg | ORAL | Status: DC | PRN
  Filled 2015-12-01: qty 20.3

## 2015-12-01 MED ORDER — PANTOPRAZOLE SODIUM 40 MG PO TBEC
40.0000 mg | DELAYED_RELEASE_TABLET | Freq: Two times a day (BID) | ORAL | Status: DC
  Administered 2015-12-02 – 2015-12-11 (×20): 40 mg via ORAL
  Filled 2015-12-01 (×22): qty 1

## 2015-12-01 MED ORDER — GUAIFENESIN-DM 100-10 MG/5ML PO SYRP: 15.0000 mL | ORAL_SOLUTION | ORAL | Status: DC | PRN

## 2015-12-01 MED ORDER — ACETAMINOPHEN 325 MG RE SUPP
325.0000 mg | RECTAL | Status: DC | PRN
  Filled 2015-12-01: qty 2

## 2015-12-01 MED ORDER — HYDRALAZINE HCL 20 MG/ML IJ SOLN: 5.0000 mg | INTRAMUSCULAR | Status: DC | PRN

## 2015-12-01 MED ORDER — SODIUM CHLORIDE 0.9 % IV SOLN
Freq: Once | INTRAVENOUS | Status: AC
  Administered 2015-12-01: 10:00:00 via INTRAVENOUS

## 2015-12-01 MED ORDER — DOCUSATE SODIUM 100 MG PO CAPS
100.0000 mg | ORAL_CAPSULE | Freq: Every day | ORAL | Status: DC
  Administered 2015-12-02 – 2015-12-10 (×8): 100 mg via ORAL
  Filled 2015-12-01 (×9): qty 1

## 2015-12-01 MED ORDER — CLONAZEPAM 0.5 MG PO TABS
2.0000 mg | ORAL_TABLET | Freq: Every day | ORAL | Status: DC
  Administered 2015-12-03 – 2015-12-06 (×4): 2 mg via ORAL
  Filled 2015-12-01 (×4): qty 4

## 2015-12-01 MED ORDER — HYDROMORPHONE HCL 1 MG/ML IJ SOLN
INTRAMUSCULAR | Status: AC
  Filled 2015-12-01: qty 1

## 2015-12-01 MED ORDER — ACETAMINOPHEN 325 MG PO TABS
325.0000 mg | ORAL_TABLET | ORAL | Status: DC | PRN
  Administered 2015-12-08 – 2015-12-09 (×4): 650 mg via ORAL
  Filled 2015-12-01 (×4): qty 2

## 2015-12-01 MED ORDER — SODIUM CHLORIDE 0.45 % IV SOLN
INTRAVENOUS | Status: DC
  Administered 2015-12-01 – 2015-12-02 (×2): via INTRAVENOUS

## 2015-12-01 MED ORDER — OXYCODONE HCL 5 MG/5ML PO SOLN: 5.0000 mg | Freq: Once | ORAL | Status: DC | PRN

## 2015-12-01 MED ORDER — OXYCODONE HCL 5 MG PO TABS
ORAL_TABLET | ORAL | Status: AC
  Filled 2015-12-01: qty 1

## 2015-12-01 MED ORDER — DEXTROSE 5 % IV SOLN
1.5000 g | Freq: Two times a day (BID) | INTRAVENOUS | Status: AC
  Administered 2015-12-01 (×2): 1.5 g via INTRAVENOUS
  Filled 2015-12-01 (×2): qty 1.5

## 2015-12-01 MED ORDER — HYDROMORPHONE HCL 1 MG/ML IJ SOLN
0.2500 mg | INTRAMUSCULAR | Status: DC | PRN
  Administered 2015-12-01: 0.25 mg via INTRAVENOUS

## 2015-12-01 MED ORDER — ZOLPIDEM TARTRATE 5 MG PO TABS
5.0000 mg | ORAL_TABLET | Freq: Every day | ORAL | Status: DC
  Administered 2015-12-03 – 2015-12-06 (×4): 5 mg via ORAL
  Filled 2015-12-01 (×4): qty 1

## 2015-12-01 MED ORDER — METOPROLOL TARTRATE 1 MG/ML IV SOLN: 2.0000 mg | INTRAVENOUS | Status: DC | PRN

## 2015-12-01 MED ORDER — ALUM & MAG HYDROXIDE-SIMETH 200-200-20 MG/5ML PO SUSP: 15.0000 mL | ORAL | Status: DC | PRN

## 2015-12-01 MED ORDER — RAMIPRIL 2.5 MG PO CAPS
2.5000 mg | ORAL_CAPSULE | Freq: Every day | ORAL | Status: DC
  Administered 2015-12-02: 2.5 mg via ORAL
  Filled 2015-12-01 (×3): qty 1

## 2015-12-01 MED ORDER — SODIUM CHLORIDE 0.9 % IV SOLN: 500.0000 mL | Freq: Once | INTRAVENOUS | Status: DC | PRN

## 2015-12-01 MED ORDER — MECLIZINE HCL 12.5 MG PO TABS
12.5000 mg | ORAL_TABLET | Freq: Three times a day (TID) | ORAL | Status: DC | PRN
  Filled 2015-12-01: qty 1

## 2015-12-01 MED ORDER — FUROSEMIDE 10 MG/ML IJ SOLN
20.0000 mg | Freq: Once | INTRAMUSCULAR | Status: AC
  Administered 2015-12-01: 20 mg via INTRAVENOUS
  Filled 2015-12-01: qty 2

## 2015-12-01 MED ORDER — PROMETHAZINE HCL 25 MG/ML IJ SOLN
6.2500 mg | Freq: Four times a day (QID) | INTRAMUSCULAR | Status: DC | PRN
  Administered 2015-12-01: 6.25 mg via INTRAVENOUS

## 2015-12-01 MED ORDER — OXYCODONE HCL 5 MG PO TABS: 5.0000 mg | ORAL_TABLET | Freq: Once | ORAL | Status: DC | PRN

## 2015-12-01 MED ORDER — ONDANSETRON HCL 4 MG/2ML IJ SOLN
INTRAMUSCULAR | Status: AC
  Filled 2015-12-01: qty 2

## 2015-12-01 MED ORDER — ASPIRIN EC 81 MG PO TBEC
81.0000 mg | DELAYED_RELEASE_TABLET | Freq: Every day | ORAL | Status: DC
  Administered 2015-12-02 – 2015-12-11 (×10): 81 mg via ORAL
  Filled 2015-12-01 (×12): qty 1

## 2015-12-01 MED ORDER — POTASSIUM CHLORIDE CRYS ER 20 MEQ PO TBCR: 20.0000 meq | EXTENDED_RELEASE_TABLET | Freq: Every day | ORAL | Status: DC | PRN

## 2015-12-01 MED ORDER — HYDROMORPHONE HCL 1 MG/ML IJ SOLN
0.2500 mg | INTRAMUSCULAR | Status: DC | PRN
  Administered 2015-12-01 (×2): 0.5 mg via INTRAVENOUS
  Administered 2015-12-03 – 2015-12-05 (×3): 0.25 mg via INTRAVENOUS
  Filled 2015-12-01 (×5): qty 1

## 2015-12-01 MED ORDER — LABETALOL HCL 5 MG/ML IV SOLN
10.0000 mg | INTRAVENOUS | Status: AC | PRN
  Administered 2015-12-01 – 2015-12-02 (×4): 10 mg via INTRAVENOUS
  Filled 2015-12-01 (×4): qty 4

## 2015-12-01 MED ORDER — PHENOL 1.4 % MT LIQD: 1.0000 | OROMUCOSAL | Status: DC | PRN

## 2015-12-01 MED ORDER — MAGNESIUM SULFATE 2 GM/50ML IV SOLN
2.0000 g | Freq: Every day | INTRAVENOUS | Status: DC | PRN
  Filled 2015-12-01: qty 50

## 2015-12-01 MED ORDER — ONDANSETRON HCL 4 MG/2ML IJ SOLN
4.0000 mg | Freq: Four times a day (QID) | INTRAMUSCULAR | Status: DC | PRN
  Administered 2015-12-01 – 2015-12-03 (×3): 4 mg via INTRAVENOUS
  Filled 2015-12-01 (×4): qty 2

## 2015-12-01 MED ORDER — NITROGLYCERIN 0.4 MG SL SUBL
0.4000 mg | SUBLINGUAL_TABLET | SUBLINGUAL | Status: DC | PRN
  Administered 2015-12-01: 0.4 mg via SUBLINGUAL
  Filled 2015-12-01: qty 1

## 2015-12-01 MED ORDER — HYDROCODONE-ACETAMINOPHEN 10-325 MG PO TABS
1.0000 | ORAL_TABLET | ORAL | Status: DC | PRN
  Administered 2015-12-05: 2 via ORAL
  Administered 2015-12-06: 1 via ORAL
  Administered 2015-12-06: 2 via ORAL
  Administered 2015-12-06 – 2015-12-07 (×2): 1 via ORAL
  Filled 2015-12-01: qty 2
  Filled 2015-12-01 (×3): qty 1
  Filled 2015-12-01: qty 2

## 2015-12-01 MED ORDER — ESTROGENS CONJUGATED 1.25 MG PO TABS
1.2500 mg | ORAL_TABLET | Freq: Every day | ORAL | Status: DC
  Administered 2015-12-02 – 2015-12-10 (×9): 1.25 mg via ORAL
  Filled 2015-12-01 (×23): qty 1

## 2015-12-01 MED ORDER — FERROUS SULFATE 325 (65 FE) MG PO TABS
325.0000 mg | ORAL_TABLET | Freq: Every day | ORAL | Status: DC
  Administered 2015-12-01 – 2015-12-11 (×11): 325 mg via ORAL
  Filled 2015-12-01 (×12): qty 1

## 2015-12-01 MED ORDER — EZETIMIBE 10 MG PO TABS
10.0000 mg | ORAL_TABLET | Freq: Every day | ORAL | Status: DC
Start: 2015-12-01 — End: 2015-12-12
  Administered 2015-12-02 – 2015-12-11 (×10): 10 mg via ORAL
  Filled 2015-12-01 (×12): qty 1

## 2015-12-01 MED ORDER — ALLOPURINOL 100 MG PO TABS
100.0000 mg | ORAL_TABLET | Freq: Every day | ORAL | Status: DC
  Administered 2015-12-02 – 2015-12-08 (×7): 100 mg via ORAL
  Filled 2015-12-01 (×8): qty 1

## 2015-12-01 MED ORDER — METOPROLOL SUCCINATE ER 25 MG PO TB24
50.0000 mg | ORAL_TABLET | Freq: Every day | ORAL | Status: DC
  Administered 2015-12-02 – 2015-12-05 (×4): 50 mg via ORAL
  Filled 2015-12-01 (×7): qty 1
  Filled 2015-12-01: qty 2
  Filled 2015-12-01 (×2): qty 1

## 2015-12-01 MED ORDER — ACETAMINOPHEN 325 MG PO TABS: 325.0000 mg | ORAL_TABLET | ORAL | Status: DC | PRN

## 2015-12-01 MED ORDER — ONDANSETRON HCL 4 MG/2ML IJ SOLN
4.0000 mg | Freq: Once | INTRAMUSCULAR | Status: AC
  Administered 2015-12-01: 4 mg via INTRAVENOUS

## 2015-12-01 NOTE — Consult Note (Signed)
CARDIOLOGY CONSULT NOTE   Patient ID: [name] MRN: [id number], DOB/AGE: [birth year]   Admit date: 12/16/2015 Date of Consult: 12/01/2015   Primary Physician: Anne Frees, MD Primary Cardiologist: Dr. Lovena [name]  Pt. Profile   frail 70 yo Female with past medical history of CAD, ICM with baseline EF 25-30% s/p St Jude ICD, chronic systolic HF, PAD s/p fem-pop bypass, HTN, HLD, RLS and CKD stage III underwent R femoral aneurysm repair, post procedure on 3/16, she had hypotension and chest pain  Problem List  Past Medical History  Diagnosis Date  . PVD (peripheral vascular disease) ([name])     status post prior femoral-popliteal bypass  . Back pain   . Arthritis   . CAD (coronary artery disease)     status post prior stenting to Anne proximal RCA, status post anterior STEMI 10/2008 (RCA occluded at Anne prior stent, LAD treated with a bare metal stent and a drug-eluting stent),  . Chronic systolic heart failure (HCC)     echo 11/2011: EF 25-30%, AS and apical AK, trivial AI, mild MR, PASP 34.  . Anemia   . Ischemic cardiomyopathy   . AICD (automatic cardioverter/defibrillator) present   . HLD (hyperlipidemia)   . GERD (gastroesophageal reflux disease)   . RLS (restless legs syndrome)   . Depression   . Tobacco abuse   . Pain in joint, lower leg   . Depressive disorder, not elsewhere classified   . Acute myocardial infarction of other anterior wall, subsequent episode of care 2010  . CHF (congestive heart failure) ([place])   . COPD (chronic obstructive pulmonary disease) ([place])   . DVT (deep venous thrombosis) ([place])   . Mixed hyperlipidemia   . Gout   . 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 Osteoarthritis   . Eczema   . Fall at home July 5,  and  Oct.  2015  . Cancer (HCC)     Skin  of Left  Face- " upper eye"5/15  . Hypertension   . Anginal pain ([place])   . Shortness of breath dyspnea     with exertion  . Chronic kidney  disease     "years ago, saw [name]"  "no one has said anything recently"  . Stroke [name])     before Heart attack, 2010  . Neuropathy, idiopathic     peripherial    Past Surgical History  Procedure Laterality Date  . Psychologist, forensic    . Foot surgery Bilateral     removed nrves off bottomof feet."  . Pr vein bypass graft,aorto-fem-pop      fem fem  04/07/09  . Abdominal hysterectomy    . Cardiac defibrillator placement    . Eye surgery  2013    Cataract bilateral   . Axillary-femoral bypass graft Right 06/29/2013    Procedure: BYPASS GRAFT AXILLA-RIGHT FEMORAL;  Surgeon: Anne Posner, MD;  Location: [name];  Service: Vascular;  Laterality: Right;  . Femoral-tibial bypass graft  2002  . Femoral-femoral bypass graft  03/2009    S/P left to right fem-fem bypass graft  . Abdominal aortagram Bilateral 06/26/2013    Procedure: ABDOMINAL AORTAGRAM;  Surgeon: Anne Dutch, MD;  Location: [name];  Service: Cardiovascular;  Laterality: Bilateral;  . Colonoscopy    . 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 Morphine     Other reaction(s): Other (See Comments) hallucinations REACTION: makes me crazy  . Gabapentin Other (See Comments)    300 mg upsets her stomach and 100 mg affect her sleep  . Naproxen Other (See Comments)    Blurred vision  . Prednisone Other (See Comments)    cranky    HPI   [name] is a frail 70 yo Female with past medical history of CAD, ICM with baseline EF 25-30% s/p St Jude ICD, chronic systolic HF, PAD s/p fem-pop bypass, HTN, HLD, RLS and CKD stage III. He had a history of proximal RCA stenting. She had anterior STEMI in February 2010. Cardiac catheterization performed on 10/19/2008 showed occluded RCA stent, occluded proximal LAD treated with bare-metal stent, distal LAD  stenosis treated with drug-eluting stent. Due to persistent low EF, she underwent St. Jude ICD placement. Last echocardiogram obtained on 3/80/2013 showed EF 25-30%, akinesis of Anne entire anteroseptal myocardium, akinesis of Anne entire apical myocardium, mild MR. Since then, patient has been having chronic chest pain.  She came in this time due to expanding right femoral false aneurysm and was taken back to Anne OR yesterday on 11/17/2015 by Dr. Kellie [name]. This morning, around 7:00AM, her blood pressure went down to Anne 70s to 80s. She was given 500 mL of IV fluid and 1 unit of PRBC, her SBP has since came back up to 110-120s.  During Anne period of hypotension, she also complained of worsening chest pain. Cardiology has been consulted for chest pain and hypotension. EKG unchanged. Trop negative x 1. She was given IVF 500cc and 1 units of PRBC, her symptom has improved since.    Inpatient Medications  . allopurinol  100 mg Oral Daily  . aspirin EC  81 mg Oral Daily  . cefUROXime (ZINACEF)  IV  1.5 g Intravenous Q12H  . clonazePAM  2 mg Oral QHS  . docusate sodium  100 mg Oral Daily  . estrogens (conjugated)  1.25 mg Oral Daily  . ezetimibe  10 mg Oral Daily  . ferrous sulfate  325 mg Oral Q breakfast  . metoprolol succinate  50 mg Oral Daily  . pantoprazole  40 mg Oral BID  . ramipril  2.5 mg Oral Daily  . zolpidem  5 mg Oral QHS    Family History Family History  Problem Relation Age of Onset  . Peripheral vascular disease Sister   . AAA (abdominal aortic aneurysm) Sister   . Diabetes Mother   . Varicose Veins Mother   . Peripheral vascular disease Mother   . Hypertension Mother   . Heart attack Father     Massive Heart Attack  . CAD Father      Social History Social History   Social History  . Marital Status: Married    Spouse Name: N/A  . Number of Children: N/A  . Years of Education: N/A   Occupational History  . Not on file.   Social History Main Topics  . Smoking  status: Current Every Day Smoker -- 1.00 packs/day for 55 years    Types: Cigarettes  . Smokeless tobacco: Never Used     Comment: 05/09/15- "I've smoked 1 in Anne past 3 days"  "Tried to smoke"  . Alcohol Use: No  . Drug Use: No  . Sexual Activity: Not on file   Other Topics Concern  . Not on file  Social History Narrative     Review of Systems  General:  No chills, fever, night sweats or weight changes.  Cardiovascular:  No dyspnea on exertion, edema, orthopnea, palpitations, paroxysmal nocturnal dyspnea. +chest pain Dermatological: No rash, lesions/masses Respiratory: No cough, dyspnea Urologic: No hematuria, dysuria Abdominal:   No nausea, vomiting, diarrhea, bright red blood per rectum, melena, or hematemesis Neurologic:  No visual changes, wkns, changes in mental status. All other systems reviewed and are otherwise negative except as noted above.  Physical Exam  Blood pressure 144/86, pulse 69, temperature 95.6 F (35.3 C), temperature source Axillary, resp. rate 19, height 5\' 4"  (1.626 m), weight 103 lb (46.72 kg), SpO2 100 %.  General: Pleasant, NAD Psych: Normal affect. Neuro: Alert, but drowsy. Having some hard time answering questions during interview. Moves all extremities spontaneously. HEENT: Normal  Neck: Supple without bruits or JVD. Lungs:  Resp regular and unlabored, CTA. Heart: RRR no s3, s4, or murmurs.  Abdomen: Soft, non-tender, non-distended, BS + x 4.  Extremities: No clubbing, cyanosis or edema. Right femoral incision noted, significant tenderness around Anne area, some bruising, and no active bleeding.  Labs   Recent Labs  12/01/15 1001  TROPONINI 0.03   [name] Results  Component Value Date   WBC 11.4* 12/01/2015   HGB 8.3* 12/01/2015   HCT 26.9* 12/01/2015   MCV 98.5 12/01/2015   PLT 91* 12/01/2015    Recent Labs [name] 12/13/2015 1235 12/01/15 0204  NA 140 137  K 4.9 5.0  CL 105 103  CO2 23 24  BUN 36* 30*  CREATININE 1.98* 1.46*    CALCIUM 9.3 8.9  PROT 6.7  --   BILITOT 0.3  --   ALKPHOS 95  --   ALT <5*  --   AST 12*  --   GLUCOSE 111* 125*   [name] Results  Component Value Date   CHOL  10/19/2008    170        ATP III CLASSIFICATION:  <200     mg/dL   Desirable  200-239  mg/dL   Borderline High  >=240    mg/dL   High          HDL 43 10/19/2008   LDLCALC * 10/19/2008    107        Total Cholesterol/HDL:CHD Risk Coronary Heart Disease Risk Table                     Men   Women  1/2 Average Risk   3.4   3.3  Average Risk       5.0   4.4  2 X Average Risk   9.6   7.1  3 X Average Risk  23.4   11.0        Use Anne calculated Patient Ratio above and Anne CHD Risk Table to determine Anne patient's CHD Risk.        ATP III CLASSIFICATION (LDL):  <100     mg/dL   Optimal  100-129  mg/dL   Near or Above                    Optimal  130-159  mg/dL   Borderline  160-189  mg/dL   High  >190     mg/dL   Very High   TRIG 100 10/19/2008   No results found for: DDIMER  Radiology/Studies  Ct Abdomen Pelvis Wo Contrast  12/16/2015  CLINICAL DATA:  Right groin pain  and swelling, initial encounter EXAM: CT ABDOMEN AND PELVIS WITHOUT CONTRAST TECHNIQUE: Multidetector CT imaging of Anne abdomen and pelvis was performed following Anne standard protocol without IV contrast. COMPARISON:  07/09/2007 FINDINGS: Lung bases are free of acute infiltrate or sizable effusion. Anne gallbladder has been surgically removed. Anne liver, spleen, adrenal glands and pancreas are within normal limits. Anne kidneys are well visualized without renal calculi. A cortical calcification is noted in Anne left kidney. Anne abdominal aorta is dilated to 3.1 cm. There are changes consistent with a right axillobifemoral bypass graft. At Anne touchdown site in Anne right groin and there is significant aneurysmal dilatation measuring 4.3 by 4.5 cm. Anne actual degree and patency within Anne aneurysmal dilatation cannot be assessed on this exam due to Anne lack of IV  contrast. Anne femoral crossover graft appears within normal limits. Anne bladder is well distended. No pelvic mass lesion or sidewall abnormality is noted. Anne appendix is not well visualized. Anne osseous structures are within normal limits. IMPRESSION: Changes consistent with aneurysmal dilatation at Anne touchdown site of Anne axillo-bifemoral bypass graft in Anne right groin. Anne degree of patency of Anne aneurysmal dilatation is uncertain as no contrast was administered for this exam. Ultrasound may be helpful for further evaluation. Vascular surgery consultation recommended. Dilatation of Anne abdominal aorta to 3 cm. Recommend followup by ultrasound in 3 years. This recommendation follows ACR consensus guidelines: White Paper of Anne ACR Incidental Findings Committee II on Vascular Findings. [facility] Coll Radiol 2013; 10:[id number] Electronically Signed   By: Inez Catalina M.D.   On: 11/17/2015 19:38   Dg Chest Portable 1 View  12/11/2015  CLINICAL DATA:  Chest pain for 1 day EXAM: PORTABLE CHEST 1 VIEW COMPARISON:  Chest x-ray dated 09/15/2013. FINDINGS: Mild cardiomegaly is stable. Left chest wall pacemaker/AICD stable in position. Atherosclerotic changes again noted at Anne aortic arch. Overall cardiomediastinal silhouette is stable in size and configuration. Lungs are clear. No evidence of pneumonia. No pleural effusion. No pneumothorax seen. Osseous structures about Anne chest are unremarkable. IMPRESSION: Stable chest x-ray. Stable mild cardiomegaly. Lungs are clear and there is no evidence of acute cardiopulmonary abnormality. Electronically Signed   By: Franki Cabot M.D.   On: 11/20/2015 20:18    ECG  Normal sinus rhythm with T-wave inversion in Anne lateral leads, QRS 130.  ASSESSMENT AND PLAN  1. Chest pain in Anne setting of hypotension  - patient has chronic chest pain, EF 25-30% s/p extensive anterior STEMI in 2010, given her significant CAD, she likely had hypoperfusion of coronary vessels during  hypotensive episode  - will trend trop overnight, make NPO in AM, if still has discomfort or dramatic trop rise, will do myoview. Given her mental status, would like to avoid cath  2. Hypotension: agree with IVF and transfusion, will monitor for now.   - some of this related to postop, however would be careful with too much fluid as she has higher chance of developing HF given EF 25-30%  3. CAD  4. ICM with baseline EF 25-30% s/p St Jude ICD  4. PAD s/p fem-pop bypass  - s/p expanding right femoral false aneurysm repair by Dr. Kellie [name] 12/15/2015  5. CKD stage III   Hilbert Corrigan, PA-C 12/01/2015, 12:24 PM

## 2015-12-01 NOTE — Progress Notes (Addendum)
  [facility] Progress Note  Subjective  - POD #1  Not feeling well. Had some chest tightness. Says she has this at home sometimes.   Objective Filed Vitals:   12/01/15 0700 12/01/15 0800  BP: 86/50 87/48  Pulse: 71 64  Temp:  97.4 F (36.3 C)  Resp: 11 14   BP 72/48 100% 2L   Intake/Output Summary (Last 24 hours) at 12/01/15 0900 Last data filed at 12/01/15 0800  Gross per 24 hour  Intake   2325 ml  Output    900 ml  Net   1425 ml   Incisions clean and intact.  JP drain in place.  Palpable right popliteal pulse. Doppler flow right PT  Assessment/Planning: 70 y.o. female is s/p:  1 Day Post-Op   Incision without hematoma. Audible PT doppler signal.  Hypotension: likely related to ABLA. H/H 8.3/26.9. Will transfuse 2 units pRBCs.  JP output 135. Keep in today.  Decreased UOP. Likely related to anemia and hypotension. Will keep foley in.  Keep in 2S until BP stabilized.   Alvia Grove 12/01/2015 9:00 AM --  Addendum Patient with another episode of chest pain. Hx of ischemic cardiomyopathy and AICD.  EKG and troponins ordered. Cardiology consulted.   Virgina Jock, PA-C  Laboratory CBC    Component Value Date/Time   WBC 11.4* 12/01/2015 0204   HGB 8.3* 12/01/2015 0204   HCT 26.9* 12/01/2015 0204   PLT 91* 12/01/2015 0204    BMET    Component Value Date/Time   NA 137 12/01/2015 0204   K 5.0 12/01/2015 0204   CL 103 12/01/2015 0204   CO2 24 12/01/2015 0204   GLUCOSE 125* 12/01/2015 0204   BUN 30* 12/01/2015 0204   CREATININE 1.46* 12/01/2015 0204   CALCIUM 8.9 12/01/2015 0204   GFRNONAA 36* 12/01/2015 0204   GFRAA 41* 12/01/2015 0204    COAG Lab Results  Component Value Date   INR 1.17 11/26/2015   INR 1.04 03/21/2014   INR 0.89 09/15/2013   No results found for: PTT  Antibiotics Anti-infectives    Start     Dose/Rate Route Frequency Ordered Stop   12/01/15 0400  cefUROXime (ZINACEF) 1.5 g in dextrose 5 % 50 mL  IVPB     1.5 g 100 mL/hr over 30 Minutes Intravenous Every 12 hours 12/01/15 0057 12/02/15 [place], PA-C [facility] Office: [phone] Pager: [phone] 12/01/2015 9:00 AM  Agree with above assessment Right inguinal wound healing satisfactorily JP drain in place-115 cc total drainage since surgery-we will leave until tomorrow 3+ pulse and axillobifemoral graft and 3+ popliteal pulse on the right is palpable with audible PT flow on the right. Both feet adequately perfused. Patient is pale today because of blood loss anemia-we'll transfuse 2 units of packed red blood cells. Blood pressure has been soft this morning in the [id number] systolic range-should respond to volume replacement Patient did have some mild chest tightness which she does experience at home. She has known cardiomyopathy.  We'll get cardiology consult and leave in 2 S. today Hopefully transfer tomorrow] and discharge over the next few days the patient remained stable

## 2015-12-01 NOTE — Progress Notes (Signed)
Dr. Bridgett Larsson called concerning pt's continued low urinary output despite two units of PRBCs.  Orders received for one time dose of 20mg  Lasix and to bolus if BP trends down.  BMP ordered for the AM.  Will continue to monitor pt closely.

## 2015-12-01 NOTE — Progress Notes (Addendum)
Pt continues to complain of CP. PA paged and orders received for STAT EKG and to cycle troponins. Will continue to monitor pt closely.

## 2015-12-01 NOTE — Evaluation (Signed)
Physical Therapy Evaluation Patient Details Name: [name] MRN: [id number] DOB: 12-May-1946 Today's Date: 12/01/2015   History of Present Illness  Patient is a 70 y/o female with hx of COPD, DVT, CHF, HTN, stroke, depression, tobacco abuse, gout, HLD and CKD s/p Resection of Right Femoral Anastamotic Pseudoaneurysm with Dacron Patch Angioplasy Right Common Femoral Artery.   Clinical Impression  Patient presents with confusion, low BP, dizziness, weakness, impaired balance and overall mobility. Ambulation limited due to hypotension. RN and MD present in room. Pt not very mobile PTA per spouse with hx of falls. Tolerated taking a few steps with Min A for balance. Pain limiting WB through RLE. Pt may need ST SNF pending progress to maximize independence, mobility and ease burden of care at home. Will follow acutely.     Follow Up Recommendations SNF;Supervision/Assistance - 24 hour    Equipment Recommendations  None recommended by PT    Recommendations for Other Services OT consult     Precautions / Restrictions Precautions Precautions: Fall Precaution Comments: soft BP Restrictions Weight Bearing Restrictions: No      Mobility  Bed Mobility               General bed mobility comments: UP in chair upon PT arrival.   Transfers Overall transfer level: Needs assistance Equipment used:  (w/c.) Transfers: Sit to/from Stand Sit to Stand: Mod assist;+2 physical assistance         General transfer comment: Assist of 2 to stand from low chair due to not wanting to place weight through RLE. + dizziness.  BP soft.   Ambulation/Gait Ambulation/Gait assistance: Min assist Ambulation Distance (Feet): 7 Feet Assistive device:  (pushing w/c.) Gait Pattern/deviations: Shuffle;Step-through pattern;Decreased stride length;Trunk flexed   Gait velocity interpretation: Below normal speed for age/gender General Gait Details: Slow, unsteady gait with reports of dizziness, "I am  going to pass out." Pt sat in chair. BP 76/54. Reclined back with LEs up. RN in room.  Stairs            Wheelchair Mobility    Modified Rankin (Stroke Patients Only)       Balance Overall balance assessment: Needs assistance Sitting-balance support: Feet supported;No upper extremity supported Sitting balance-Leahy Scale: Fair     Standing balance support: During functional activity;Bilateral upper extremity supported Standing balance-Leahy Scale: Poor Standing balance comment: Reliant on BUEs for support in standing.                             Pertinent Vitals/Pain Pain Assessment: Faces Faces Pain Scale: Hurts even more Pain Location: groin and chest Pain Descriptors / Indicators: Sore Pain Intervention(s): Monitored during session;Repositioned;Limited activity within patient's tolerance;Patient requesting pain meds-RN notified    Home Living Family/patient expects to be discharged to:: Private residence Living Arrangements: Spouse/significant other Available Help at Discharge: Family;Available 24 hours/day Type of Home: House Home Access: Stairs to enter Entrance Stairs-Rails: Can reach both;Left;Right Entrance Stairs-Number of Steps: 4 Home Layout: One level Home Equipment: Transport planner;Tub bench;Walker - standard;Cane - single point      Prior Function Level of Independence: Needs assistance   Gait / Transfers Assistance Needed: Uses SPC but spouse reports she is not very mobile at home, gets up to use bathroom. Falls at home.  ADL's / Homemaking Assistance Needed: Spouse assists getting in/out of tub, reports pt does dressing on own.        Hand Dominance   Dominant Hand:  Right    Extremity/Trunk Assessment   Upper Extremity Assessment: Defer to OT evaluation           Lower Extremity Assessment: Generalized weakness (Grossly ~3/5 throughout.)         Communication   Communication: No difficulties  Cognition  Arousal/Alertness: Lethargic Behavior During Therapy: Flat affect Overall Cognitive Status: Impaired/Different from baseline Area of Impairment: Problem solving;Safety/judgement         Safety/Judgement: Decreased awareness of deficits   Problem Solving: Slow processing;Requires verbal cues General Comments: Difficulty finishing a thought or sentence. Easily distracted. "can you get me a ash tray?"  "move some of this furniture around." Easily redirectable but confused.     General Comments General comments (skin integrity, edema, etc.): Spouse present in room towards end of session. Pt reports chest pain with movement that subsides with rest. RN and MD aware and present in room. BP after sitting in chair for a few mins 72/48.    Exercises        Assessment/Plan    PT Assessment Patient needs continued PT services  PT Diagnosis Difficulty walking;Acute pain;Generalized weakness   PT Problem List Decreased strength;Pain;Cardiopulmonary status limiting activity;Decreased activity tolerance;Decreased balance;Decreased mobility;Decreased safety awareness;Decreased knowledge of use of DME;Decreased cognition  PT Treatment Interventions Balance training;Gait training;Functional mobility training;Therapeutic activities;Therapeutic exercise;Patient/family education;Stair training;DME instruction   PT Goals (Current goals can be found in the Care Plan section) Acute Rehab PT Goals Patient Stated Goal: none stated PT Goal Formulation: With patient Time For Goal Achievement: 12/15/15 Potential to Achieve Goals: Fair    Frequency Min 3X/week   Barriers to discharge Inaccessible home environment steps to enter home    Co-evaluation               End of Session Equipment Utilized During Treatment: Gait belt;Oxygen Activity Tolerance: Treatment limited secondary to medical complications (Comment) (soft BP, chest pain.) Patient left: in chair;with call bell/phone within reach;with  family/visitor present;with nursing/sitter in room Nurse Communication: Mobility status         Time: [id number] PT Time Calculation (min) (ACUTE ONLY): 25 min   Charges:   PT Evaluation $PT Eval Moderate Complexity: 1 Procedure PT Treatments $Therapeutic Activity: 8-22 mins   PT G Codes:        Rik Wadel A Rylon Poitra 12/01/2015, 10:40 AM  Wray Kearns, PT, DPT [phone]

## 2015-12-01 NOTE — Progress Notes (Signed)
Pt with complaints of substernal CP 8/10 at 1115.  BP stable therefore 1 SL NTG given.  After 5 mins, pt reports pain has decreased to 7/10. MD at bedside. Discussed EKG and troponin level.  Plan of care discussed for pt to include blood, oxygen, cardiology consult, and NTG. Pt and daughter verbalized understanding. Pt refuses any further doses of SL NTG.  At 1135  Pt had small amount of emesis. Pt assisted to sitting upright in chair. Pt reports no CP at this time. Cardiology card master paged and reports PA to be by soon.  Will continue to monitor pt closely.

## 2015-12-01 NOTE — Transfer of Care (Signed)
Immediate Anesthesia Transfer of Care Note  Patient: [name]  Procedure(s) Performed: Procedure(s): Resection of Right Femoral Anastamotic Pseudoaneurysm with Dacron Patch Angioplasy Right Common Femoral Artery  (Right)  Patient Location: PACU  Anesthesia Type:General  Level of Consciousness: awake, oriented, patient cooperative and responds to stimulation  Airway & Oxygen Therapy: Patient Spontanous Breathing and Patient connected to nasal cannula oxygen  Post-op Assessment: Report given to RN, Post -op Vital signs reviewed and stable and Patient moving all extremities X 4  Post vital signs: Reviewed and stable  Last Vitals:  Filed Vitals:   12/02/2015 2015 12/07/2015 2045  BP: 117/74 135/90  Pulse: 57 63  Temp:    Resp: 14 13    Complications: No apparent anesthesia complications

## 2015-12-02 ENCOUNTER — Inpatient Hospital Stay (HOSPITAL_COMMUNITY): Payer: Commercial Managed Care - HMO

## 2015-12-02 DIAGNOSIS — I724 Aneurysm of artery of lower extremity: Secondary | ICD-10-CM

## 2015-12-02 LAB — CBC
HEMATOCRIT: 31.8 % — AB (ref 36.0–46.0)
Hemoglobin: 11 g/dL — ABNORMAL LOW (ref 12.0–15.0)
MCH: 30.8 pg (ref 26.0–34.0)
MCHC: 34.6 g/dL (ref 30.0–36.0)
MCV: 89.1 fL (ref 78.0–100.0)
Platelets: 77 10*3/uL — ABNORMAL LOW (ref 150–400)
RBC: 3.57 MIL/uL — ABNORMAL LOW (ref 3.87–5.11)
RDW: 16.7 % — AB (ref 11.5–15.5)
WBC: 23.3 10*3/uL — AB (ref 4.0–10.5)

## 2015-12-02 LAB — URINALYSIS, ROUTINE W REFLEX MICROSCOPIC
Bilirubin Urine: NEGATIVE
GLUCOSE, UA: NEGATIVE mg/dL
KETONES UR: NEGATIVE mg/dL
Leukocytes, UA: NEGATIVE
Nitrite: NEGATIVE
PROTEIN: NEGATIVE mg/dL
Specific Gravity, Urine: 1.015 (ref 1.005–1.030)
pH: 5 (ref 5.0–8.0)

## 2015-12-02 LAB — BASIC METABOLIC PANEL
ANION GAP: 13 (ref 5–15)
BUN: 44 mg/dL — ABNORMAL HIGH (ref 6–20)
CALCIUM: 8.5 mg/dL — AB (ref 8.9–10.3)
CO2: 16 mmol/L — AB (ref 22–32)
Chloride: 101 mmol/L (ref 101–111)
Creatinine, Ser: 2.35 mg/dL — ABNORMAL HIGH (ref 0.44–1.00)
GFR, EST AFRICAN AMERICAN: 23 mL/min — AB (ref >60)
GFR, EST NON AFRICAN AMERICAN: 20 mL/min — AB (ref >60)
Glucose, Bld: 147 mg/dL — ABNORMAL HIGH (ref 65–99)
Potassium: 5.5 mmol/L — ABNORMAL HIGH (ref 3.5–5.1)
Sodium: 130 mmol/L — ABNORMAL LOW (ref 135–145)

## 2015-12-02 LAB — TYPE AND SCREEN
ABO/RH(D): B NEG
Antibody Screen: NEGATIVE
Unit division: 0
Unit division: 0

## 2015-12-02 LAB — URINE MICROSCOPIC-ADD ON

## 2015-12-02 LAB — TROPONIN I
Troponin I: 0.14 ng/mL — ABNORMAL HIGH (ref <0.031)
Troponin I: 0.12 ng/mL — ABNORMAL HIGH (ref <0.031)

## 2015-12-02 MED ORDER — SODIUM CHLORIDE 0.9 % IV SOLN
INTRAVENOUS | Status: DC
  Administered 2015-12-02: 100 mL via INTRAVENOUS
  Administered 2015-12-02: 100 mL/h via INTRAVENOUS
  Administered 2015-12-03 – 2015-12-05 (×4): via INTRAVENOUS

## 2015-12-02 NOTE — Progress Notes (Addendum)
  [facility] Progress Note Subjective  - POD #2  Still having chest pain. Having some shortness of breath too. Wants something to eat.   Objective Filed Vitals:   12/02/15 0600 12/02/15 0700  BP: 96/61 142/83  Pulse: 75 80  Temp:    Resp: 28 26   Tmax 97.5 BP 142/83 02 100% on 3L   Intake/Output Summary (Last 24 hours) at 12/02/15 0747 Last data filed at 12/02/15 0600  Gross per 24 hour  Intake 2043.75 ml  Output    535 ml  Net 1508.75 ml   Ill appearing in chair in NAD Lungs clear with decreased breath sounds at bases RRR Right thigh incision clean and intact. Some ecchymosis surrounding incision. No hematoma. JP with ~10 cc output Palpable right popliteal pulse. Doppler flow PT bilaterally   Assessment/Planning: 70 y.o. female is s/p: resection of right femoral anastamotic pseudoaneurysm with dacron patch angioplasty of right common femoral artery.   2 Days Post-Op   Palpable right popliteal pulse and doppler flow to PTs bilaterally.  Still with CP this am. For stress test this am. Troponins negative. Appreciate cardiology following.  Hypotension resolved with IVF and transfusion.  Leukocytosis: afebrile. No evidence of infection. Will order CXR and UA.  ABLA: H/H improved after transfusion of 2 units pRBCs.  Thrombocytopenia: 77, will follow.  JP with low output, d/c today.  ARF: decreased urine output and increased creatinine. Appears dry. Will increase IVF to 100 cc/hr.  Needs to mobilize.  Encourage incentive spirometry 10x/ hr Keep in 2S for now, possible transfer out of ICU later today.   [name] 12/02/2015 7:47 AM --  Agree with above. Right groin incision looks fine.  Transfer to 2W when ok with Cardiology.  Deitra Mayo, MD, FACS Beeper [phone] Office: [phone]  Laboratory CBC    Component Value Date/Time   WBC 23.3* 12/02/2015 0514   HGB 11.0* 12/02/2015 0514   HCT 31.8* 12/02/2015 0514   PLT 77*  12/02/2015 0514    BMET    Component Value Date/Time   NA 130* 12/02/2015 0514   K 5.5* 12/02/2015 0514   CL 101 12/02/2015 0514   CO2 16* 12/02/2015 0514   GLUCOSE 147* 12/02/2015 0514   BUN 44* 12/02/2015 0514   CREATININE 2.35* 12/02/2015 0514   CALCIUM 8.5* 12/02/2015 0514   GFRNONAA 20* 12/02/2015 0514   GFRAA 23* 12/02/2015 0514    COAG Lab Results  Component Value Date   INR 1.17 11/19/2015   INR 1.04 03/21/2014   INR 0.89 09/15/2013   No results found for: PTT  Antibiotics Anti-infectives    Start     Dose/Rate Route Frequency Ordered Stop   12/01/15 0400  cefUROXime (ZINACEF) 1.5 g in dextrose 5 % 50 mL IVPB     1.5 g 100 mL/hr over 30 Minutes Intravenous Every 12 hours 12/01/15 0057 12/01/15 1742       Virgina Jock, PA-C [facility] Office: [phone] Pager: [phone] 12/02/2015 7:47 AM

## 2015-12-02 NOTE — Care Management Important Message (Signed)
Important Message  Patient Details  Name: [name] MRN: [id number] Date of Birth: April 14, 1946   Medicare Important Message Given:  Yes    [name] P [name] 12/02/2015, 2:22 PM

## 2015-12-02 NOTE — Progress Notes (Signed)
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 allopurinol  100 mg Oral Daily  . aspirin EC  81 mg Oral Daily  . clonazePAM  2 mg Oral QHS  . docusate sodium  100 mg Oral Daily  . estrogens (conjugated)  1.25 mg Oral Daily  . ezetimibe  10 mg Oral Daily  . ferrous sulfate  325 mg Oral Q breakfast  . metoprolol succinate  50 mg Oral Daily  . pantoprazole  40 mg Oral BID  . zolpidem  5 mg Oral QHS    OBJECTIVE  Filed Vitals:   12/02/15 1300 12/02/15 1400 12/02/15 1500 12/02/15 1613  BP: 144/84 140/71 134/70   Pulse: 93 83 85   Temp:    97.6 F (36.4 C)  TempSrc:    Oral  Resp: 15 21 30    Height:      Weight:      SpO2: 100% 100% 100%     Intake/Output Summary (Last 24 hours) at 12/02/15 1640 Last data filed at 12/02/15 1500  Gross per 24 hour  Intake 2157.5 ml  Output    500 ml  Net 1657.5 ml   Filed Weights   11/28/2015 2124  Weight: 103 lb (46.72 kg)    PHYSICAL EXAM  General: Pleasant, NAD. Appears to be very weak. Family at bedside Neuro: Alert Moves all extremities spontaneously. Psych: Normal affect. HEENT:  Normal  Neck: Supple without bruits or JVD. Lungs:  Resp regular and unlabored, CTA. Heart: RRR no s3, s4, or murmurs. Abdomen: Soft, non-distended, BS + x 4.  +significant abdominal tenderness and also R groin tenderness Extremities: No clubbing, cyanosis or edema. DP/PT/Radials 2+ and equal bilaterally.  Accessory Clinical Findings  CBC  Recent Labs  12/01/15 1840 12/02/15 0514  WBC 20.5* 23.3*  HGB 11.7* 11.0*  HCT 33.9* 31.8*  MCV 90.2 89.1  PLT 104* 77*   Basic Metabolic Panel  Recent Labs  12/01/15 0204 12/02/15 0514  NA 137 130*  K 5.0  5.5*  CL 103 101  CO2 24 16*  GLUCOSE 125* 147*  BUN 30* 44*  CREATININE 1.46* 2.35*  CALCIUM 8.9 8.5*   Liver Function Tests  Recent Labs  11/18/2015 1235  AST 12*  ALT <5*  ALKPHOS 95  BILITOT 0.3  PROT 6.7  ALBUMIN 3.4*    Recent Labs  11/16/2015 1235  LIPASE 33   Cardiac Enzymes  Recent Labs  12/01/15 1001 12/02/15 0740 12/02/15 1239  TROPONINI 0.03 0.14* 0.12*    TELE  NSR with HR 80s    ECG  NSR with LBBB  Echocardiogram 11/23/2011  Left ventricle: The cavity size was moderately dilated. Wall thickness was normal. Systolic function was severely reduced. The estimated ejection fraction was in the range of 25% to 30%. Akinesis of the entireanteroseptal myocardium. Akinesis of the entireapical myocardium. There was an increased relative contribution of atrial contraction to ventricular filling. - Aortic valve: Trivial regurgitation. - Mitral valve: Mild regurgitation. - Pulmonary arteries: PA peak pressure: 11mm Hg (S).    Radiology/Studies  Ct Abdomen Pelvis Wo Contrast  12/11/2015  CLINICAL DATA:  Right groin pain and swelling, initial encounter EXAM: CT ABDOMEN  AND PELVIS WITHOUT CONTRAST TECHNIQUE: Multidetector CT imaging of the abdomen and pelvis was performed following the standard protocol without IV contrast. COMPARISON:  07/09/2007 FINDINGS: Lung bases are free of acute infiltrate or sizable effusion. The gallbladder has been surgically removed. The liver, spleen, adrenal glands and pancreas are within normal limits. The kidneys are well visualized without renal calculi. A cortical calcification is noted in the left kidney. The abdominal aorta is dilated to 3.1 cm. There are changes consistent with a right axillobifemoral bypass graft. 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 The actual degree and patency within the aneurysmal dilatation cannot be assessed on this  exam due to the lack of IV contrast. The femoral crossover graft appears within normal limits. The bladder is well distended. No pelvic mass lesion or sidewall abnormality is noted. The appendix is not well visualized. The osseous structures are within normal limits. 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 The degree of patency of the aneurysmal dilatation is uncertain as no contrast was administered for this exam. Ultrasound may be helpful for further evaluation. Vascular surgery consultation recommended. Dilatation of the abdominal aorta to 3 cm. Recommend followup by ultrasound in 3 years. This recommendation follows ACR consensus guidelines: White Paper of the ACR Incidental Findings Committee II on Vascular Findings. Natasha Mead Coll Radiol 2013; 10:789-794 Electronically Signed   By: Inez Catalina M.D.   On: 11/16/2015 19:38   Dg Chest Port 1 View  12/02/2015  CLINICAL DATA:  Shortness of breath. Status post surgical repair of right femoral anastomotic pseudoaneurysm. EXAM: PORTABLE CHEST 1 VIEW COMPARISON:  12/11/2015 FINDINGS: Stable appearance of ICD/pacing device. The heart size and mediastinal contours are normal. Lung volumes are normal without significant atelectasis identified. There is no evidence of pulmonary edema, consolidation, pneumothorax, nodule or pleural fluid. IMPRESSION: No active disease. Electronically Signed   By: Aletta Edouard M.D.   On: 12/02/2015 08:34   Dg Chest Portable 1 View  11/20/2015  CLINICAL DATA:  Chest pain for 1 day EXAM: PORTABLE CHEST 1 VIEW COMPARISON:  Chest x-ray dated 09/15/2013. FINDINGS: Mild cardiomegaly is stable. Left chest wall pacemaker/AICD stable in position. Atherosclerotic changes again noted at the aortic arch. Overall cardiomediastinal silhouette is stable in size and configuration. Lungs are clear. No evidence of pneumonia. No pleural effusion. No  pneumothorax seen. Osseous structures about the chest are unremarkable. IMPRESSION: Stable chest x-ray. Stable mild cardiomegaly. Lungs are clear and there is no evidence of acute cardiopulmonary abnormality. Electronically Signed   By: Franki Cabot M.D.   On: 11/28/2015 20:18    ASSESSMENT AND PLAN  1. Chest pain in the setting of hypotension  - patient has chronic chest pain, EF 25-30% s/p extensive anterior STEMI in 2010, given her significant CAD, she likely had hypoperfusion of coronary vessels during hypotensive episode  - trop mildly elevated, but chest pain improved, Cr and WBC jumped this morning, would avoid invasive workup at this time as her symptom is getting better and likely something else is brewing. The hypotension yesterday can explain the rise in Cr, but cannot explain the elevated WBC. Per nurse, she ambulated 600 feet today without chest pain.   - she is having frequent desat into low 80s, but lung exam clear, CXR clear as well  2. Severely abdominal pain: she is not just having pain in R femoral  site but entire abdomen, last BM 3 days ago. Concerned about intraabdominal etiology for elevated WBC. Consider CT of abdomen, ideally with contrast, however likely has to get one without contrast given worsening renal function  3. Hypotension: agree with IVF and transfusion, will monitor for now.   - some of this related to postop, however would be careful with too much fluid as she has higher chance of developing HF given EF 25-30%  4. CAD  5. ICM with baseline EF 25-30% s/p [employer] Jude ICD  6. PAD s/p fem-pop bypass  - s/p expanding right femoral false aneurysm repair by Dr. Kellie Simmering 12/04/2015  5. Acute on CKD stage III: likely related to hypotension yesterday   Signed, Woodward Ku Pager: [phone]  Agree with note by Almyra Deforest PA-C  BP better today and Hgb stable. Scr up a bit. SR. No Cp. Major c/o diffuse abd pain. Diminished BS. WBC have risen to 23K. BC drawn. Agree may  benefit from not contrast Abd CT but will defer to [facility]. Will be avail if necessary.  Lorretta Harp, M.D., [place], [facility], [place] [address] [place], [place]  [address]  [phone] 12/02/2015 5:22 PM

## 2015-12-02 NOTE — Clinical Documentation Improvement (Signed)
  Vascular Surgery  Patient with abnormal Creatinine following "chest tightness and hypotensive event". Creatinines increased from: 1.98 on admission, 1.46 and then 2.35; a 0.89 bump.  Please provide a diagnosis associated with the abnormal lab value and treatment provided and document findings in next progress note   Acute Renal Failure/Acute Kidney Injury  Acute Renal Failure with Acute Tubular Necrosis  Acute on Chronic Renal Failure Stage3  Chronic Renal Failure Stage3  Other  Clinically Undetermined  Document any associated diagnoses/conditions.  Supporting Information:  BP's were running: 70/41 with Map of 52, 87/48 Map of 53, 86/50 Map of 62  Fluid resuscitation given along with 2 units PRBC's  History of ischemic cardiomyopathy, previous MI  Please exercise your independent, professional judgment when responding. A specific answer is not anticipated or expected.  Thank You,  Zoila Shutter RN, BSN, [place] [phone]

## 2015-12-02 NOTE — Anesthesia Postprocedure Evaluation (Signed)
Anesthesia Post Note  Patient: [name]  Procedure(s) Performed: Procedure(s) (LRB): Resection of Right Femoral Anastamotic Pseudoaneurysm with Dacron Patch Angioplasy Right Common Femoral Artery  (Right)  Patient location during evaluation: PACU Anesthesia Type: General Level of consciousness: awake Pain management: pain level controlled Vital Signs Assessment: post-procedure vital signs reviewed and stable Respiratory status: spontaneous breathing and respiratory function stable Cardiovascular status: stable Postop Assessment: no signs of nausea or vomiting Anesthetic complications: no    Last Vitals:  Filed Vitals:   12/02/15 0500 12/02/15 0600  BP: 148/83 96/61  Pulse: 90 75  Temp:    Resp: 27 28    Last Pain:  Filed Vitals:   12/02/15 0604  PainSc: Asleep                 Jezabella Schriever

## 2015-12-03 ENCOUNTER — Inpatient Hospital Stay (HOSPITAL_COMMUNITY): Payer: Commercial Managed Care - HMO

## 2015-12-03 LAB — BASIC METABOLIC PANEL
ANION GAP: 11 (ref 5–15)
BUN: 39 mg/dL — ABNORMAL HIGH (ref 6–20)
CALCIUM: 8.4 mg/dL — AB (ref 8.9–10.3)
CHLORIDE: 107 mmol/L (ref 101–111)
CO2: 17 mmol/L — AB (ref 22–32)
Creatinine, Ser: 1.71 mg/dL — ABNORMAL HIGH (ref 0.44–1.00)
GFR calc non Af Amer: 29 mL/min — ABNORMAL LOW (ref >60)
GFR, EST AFRICAN AMERICAN: 34 mL/min — AB (ref >60)
GLUCOSE: 82 mg/dL (ref 65–99)
POTASSIUM: 4.6 mmol/L (ref 3.5–5.1)
Sodium: 135 mmol/L (ref 135–145)

## 2015-12-03 LAB — CBC
HEMATOCRIT: 27.3 % — AB (ref 36.0–46.0)
HEMOGLOBIN: 9.1 g/dL — AB (ref 12.0–15.0)
MCH: 29.8 pg (ref 26.0–34.0)
MCHC: 33.3 g/dL (ref 30.0–36.0)
MCV: 89.5 fL (ref 78.0–100.0)
Platelets: 91 10*3/uL — ABNORMAL LOW (ref 150–400)
RBC: 3.05 MIL/uL — AB (ref 3.87–5.11)
RDW: 16.1 % — ABNORMAL HIGH (ref 11.5–15.5)
WBC: 27.6 10*3/uL — AB (ref 4.0–10.5)

## 2015-12-03 MED ORDER — PIPERACILLIN-TAZOBACTAM 3.375 G IVPB
3.3750 g | Freq: Three times a day (TID) | INTRAVENOUS | Status: DC
  Administered 2015-12-03 – 2015-12-07 (×13): 3.375 g via INTRAVENOUS
  Filled 2015-12-03 (×17): qty 50

## 2015-12-03 MED ORDER — ENSURE ENLIVE PO LIQD
237.0000 mL | Freq: Two times a day (BID) | ORAL | Status: DC
  Administered 2015-12-04: 237 mL via ORAL

## 2015-12-03 MED ORDER — IOHEXOL 300 MG/ML  SOLN
25.0000 mL | INTRAMUSCULAR | Status: AC
  Administered 2015-12-03 (×2): 25 mL via ORAL

## 2015-12-03 NOTE — Progress Notes (Signed)
Pharmacy Antibiotic Note  DWANA WANE is a 70 y.o. female admitted on 11/28/2015 with intra-abdominal infection.  Pharmacy has been consulted for zosyn dosing.  Plan: Zosyn 3.375g IV q8h (4 hour infusion).  Monitor culture data, renal function and clinical course  Height: 5\' 4"  (162.6 cm) Weight: 103 lb (46.72 kg) IBW/kg (Calculated) : 54.7  Temp (24hrs), Avg:97.8 F (36.6 C), Min:97.5 F (36.4 C), Max:98.6 F (37 C)   Recent Labs Lab 12/05/2015 1235 12/01/15 0204 12/01/15 1840 12/02/15 0514 12/03/15 0430  WBC 11.9* 11.4* 20.5* 23.3* 27.6*  CREATININE 1.98* 1.46*  --  2.35* 1.71*    Estimated Creatinine Clearance: 22.9 mL/min (by C-G formula based on Cr of 1.71).    Allergies  Allergen Reactions  . Morphine     Other reaction(s): Other (See Comments) hallucinations REACTION: makes me crazy  . Gabapentin Other (See Comments)    300 mg upsets her stomach and 100 mg affect her sleep  . Naproxen Other (See Comments)    Blurred vision  . Prednisone Other (See Comments)    cranky    Antimicrobials this admission: Zosyn 3/18 >>    >>   Dose adjustments this admission: n/a  Microbiology results: 3/17 BCx: ngtd  UCx:    Sputum:   3/16 MRSA PCR: neg   Andrey Cota. Diona Foley, PharmD, [place] Clinical Pharmacist Pager [phone] 12/03/2015 9:44 AM

## 2015-12-03 NOTE — Progress Notes (Addendum)
   VASCULAR SURGERY ASSESSMENT & PLAN:  * 3 Days Post-Op s/p: Repair of expanding right FFA (s/p Right Axillobifemoral bypass)  *  Given abdominal pain, increasing WBC, and tenderness on exam, will get CT of abd/pelvis to r/o intraabdominal process.   * ID: Blood cultures were drawn yesterday. I will start Zosyn pending CT scan results.  SUBJECTIVE: C/O abdominal pain. No BM  PHYSICAL EXAM: Filed Vitals:   12/03/15 0400 12/03/15 0500 12/03/15 0600 12/03/15 0700  BP: 141/86 134/87 127/91 141/83  Pulse:  37  99  Temp: 97.6 F (36.4 C)     TempSrc: Oral     Resp: 33  27 27  Height:      Weight:      SpO2: 100%   98%   Incision right groin intact. No erythema or drainage.  Moderate swelling and ecchymosis  LABS: Lab Results  Component Value Date   WBC 27.6* 12/03/2015   HGB 9.1* 12/03/2015   HCT 27.3* 12/03/2015   MCV 89.5 12/03/2015   PLT 91* 12/03/2015   Lab Results  Component Value Date   CREATININE 1.71* 12/03/2015   Lab Results  Component Value Date   INR 1.17 12/03/2015   CBG (last 3)  No results for input(s): GLUCAP in the last 72 hours.  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

## 2015-12-03 NOTE — Progress Notes (Signed)
Ct abdomen results called to Dr. Scot Dock.

## 2015-12-04 LAB — BASIC METABOLIC PANEL
Anion gap: 8 (ref 5–15)
BUN: 27 mg/dL — AB (ref 6–20)
CHLORIDE: 110 mmol/L (ref 101–111)
CO2: 17 mmol/L — AB (ref 22–32)
Calcium: 8 mg/dL — ABNORMAL LOW (ref 8.9–10.3)
Creatinine, Ser: 1.37 mg/dL — ABNORMAL HIGH (ref 0.44–1.00)
GFR calc Af Amer: 44 mL/min — ABNORMAL LOW (ref >60)
GFR calc non Af Amer: 38 mL/min — ABNORMAL LOW (ref >60)
Glucose, Bld: 108 mg/dL — ABNORMAL HIGH (ref 65–99)
POTASSIUM: 4 mmol/L (ref 3.5–5.1)
SODIUM: 135 mmol/L (ref 135–145)

## 2015-12-04 LAB — CBC
HEMATOCRIT: 22.4 % — AB (ref 36.0–46.0)
HEMOGLOBIN: 7.6 g/dL — AB (ref 12.0–15.0)
MCH: 30.8 pg (ref 26.0–34.0)
MCHC: 33.9 g/dL (ref 30.0–36.0)
MCV: 90.7 fL (ref 78.0–100.0)
Platelets: 92 10*3/uL — ABNORMAL LOW (ref 150–400)
RBC: 2.47 MIL/uL — AB (ref 3.87–5.11)
RDW: 16.3 % — ABNORMAL HIGH (ref 11.5–15.5)
WBC: 22.1 10*3/uL — ABNORMAL HIGH (ref 4.0–10.5)

## 2015-12-04 NOTE — Progress Notes (Signed)
   VASCULAR SURGERY ASSESSMENT & PLAN:  * 4 Days Post-Op s/p: Repair of right FFA.  *  Abdominal pain improved. CT scan fairly unremarkable. There was some thickening in the descending colon- "colitis not completely excluded."  * She is improving. Will continue Zosyn (Day 2). Blood cultures negative so far.   *  GI/ Nutrition: taking some po.   * check f/u WBC. CT yesterday was done without contrast.   SUBJECTIVE: Slept well last night. Had some confusion yesterday.  PHYSICAL EXAM: Filed Vitals:   12/04/15 0300 12/04/15 0400 12/04/15 0500 12/04/15 0600  BP: 119/74 118/78 116/79 110/78  Pulse: 84 91 87 85  Temp:  97.8 F (36.6 C)    TempSrc:  Axillary    Resp: 31 38 37 29  Height:      Weight:    123 lb 3.8 oz (55.9 kg)  SpO2: 96% 96% 97% 95%   Abdomen not tender this AM. Has bowel sounds Right groin swelling, ecchymosis unchanged.  LABS: Pend.   Active Problems:   False aneurysm (HCC)   Pseudoaneurysm of right femoral artery (HCC)   Ischemic chest pain (HCC)   Coronary artery disease due to lipid rich plaque   Cardiomyopathy, ischemic   Gae Gallop Beeper: [id number] 12/04/2015

## 2015-12-04 NOTE — Evaluation (Signed)
Occupational Therapy Evaluation Patient Details Name: [name] MRN: [id number] DOB: [birth year] Today's Date: 12/04/2015    History of Present Illness Patient is a 70 y/o female with hx of COPD, DVT, CHF, HTN, stroke, depression, tobacco abuse, gout, HLD and CKD s/p Resection of Right Femoral Anastamotic Pseudoaneurysm with Dacron Patch Angioplasy Right Common Femoral Artery.    Clinical Impression   This 70 yo female admitted and underwent above presents to acute OT with deficits below affecting her ability to care for herself at an independent level for basic ADLs as she was pta. She will benefit from acute OT with follow up OT at SNF to get back to PLOF.    Follow Up Recommendations  SNF    Equipment Recommendations   (TBD at next venue)       Precautions / Restrictions Precautions Precautions: Fall Precaution Comments: soft BP Restrictions Weight Bearing Restrictions: No      Mobility Bed Mobility Overal bed mobility: Needs Assistance Bed Mobility: Supine to Sit     Supine to sit: Min assist        Transfers Overall transfer level: Needs assistance Equipment used:  (W/C to push)   Sit to Stand: Min assist              Balance Overall balance assessment: Needs assistance Sitting-balance support: No upper extremity supported;Feet supported Sitting balance-Leahy Scale: Fair     Standing balance support: Bilateral upper extremity supported Standing balance-Leahy Scale: Poor (reliant on hands on handles of W/C)                              ADL Overall ADL's : Needs assistance/impaired Eating/Feeding: Independent;Sitting   Grooming: Set up;Sitting   Upper Body Bathing: Set up;Sitting   Lower Body Bathing: Moderate assistance (min A sit<>stand)   Upper Body Dressing : Set up;Sitting   Lower Body Dressing: Moderate assistance (min A sit<>stand)   Toilet Transfer: Minimal assistance;Ambulation;RW Toilet Transfer Details (indicate  cue type and reason): bed>around unit 2S>sit in recliner Toileting- Clothing Manipulation and Hygiene: Minimal assistance;Sit to/from stand                         Pertinent Vitals/Pain Pain Assessment: Faces Faces Pain Scale: Hurts little more Pain Location: right groin Pain Descriptors / Indicators: Sore Pain Intervention(s): Monitored during session;Repositioned     Hand Dominance Right   Extremity/Trunk Assessment Upper Extremity Assessment Upper Extremity Assessment: Generalized weakness           Communication Communication Communication: No difficulties   Cognition Arousal/Alertness: Awake/alert Behavior During Therapy: Flat affect Overall Cognitive Status: Impaired/Different from baseline Area of Impairment: Problem solving;Safety/judgement;Orientation Orientation Level: Disoriented to;Time (thought she had just had sx yesterday (3/18))           Problem Solving: Slow processing;Requires verbal cues                Home Living Family/patient expects to be discharged to:: Skilled nursing facility Living Arrangements: Spouse/significant other Available Help at Discharge: Family;Available 24 hours/day Type of Home: House Home Access: Stairs to enter [employer] of Steps: 4 Entrance Stairs-Rails: Can reach both;Left;Right Home Layout: One level     Bathroom Shower/Tub: Teacher, early years/pre: Handicapped height     Home Equipment: Transport planner;Tub bench;Walker - standard;Cane - single point          Prior Functioning/Environment Level of  Independence: Needs assistance  Gait / Transfers Assistance Needed: Uses SPC but spouse reports she is not very mobile at home, gets up to use bathroom. Falls at home. ADL's / Homemaking Assistance Needed: Spouse assists getting in/out of tub, reports pt does dressing on own.        OT Diagnosis: Generalized weakness;Cognitive deficits;Acute pain   OT Problem List:  Decreased strength;Decreased range of motion;Impaired balance (sitting and/or standing);Pain;Decreased knowledge of use of DME or AE   OT Treatment/Interventions: Self-care/ADL training;Patient/family education;Balance training;Therapeutic activities;DME and/or AE instruction    OT Goals(Current goals can be found in the care plan section) Acute Rehab OT Goals Patient Stated Goal: to get stronger OT Goal Formulation: With patient Time For Goal Achievement: 12/18/15 Potential to Achieve Goals: Good  OT Frequency: Min 2X/week              End of Session Equipment Utilized During Treatment: Gait belt (pushing W/C) Nurse Communication:  (nurse saw pt walking with me and pt had walked with RN eariler)  Activity Tolerance: Patient tolerated treatment well (but was fatigued after walk of 300+ feet) Patient left: in chair;with call bell/phone within reach;with chair alarm set   Time: [id number] OT Time Calculation (min): 29 min Charges:  OT General Charges $OT Visit: 1 Procedure OT Evaluation $OT Eval Moderate Complexity: 1 Procedure OT Treatments $Self Care/Home Management : 8-22 mins    Almon Register [id number] 12/04/2015, 6:33 PM

## 2015-12-04 NOTE — Progress Notes (Signed)
ANTIBIOTIC CONSULT NOTE - INITIAL  Pharmacy Consult for Zosyn Indication: Intra-abdominal infection  Allergies  Allergen Reactions  . Morphine     Other reaction(s): Other (See Comments) hallucinations REACTION: makes me crazy  . Gabapentin Other (See Comments)    300 mg upsets her stomach and 100 mg affect her sleep  . Naproxen Other (See Comments)    Blurred vision  . Prednisone Other (See Comments)    cranky    Patient Measurements: Height: 5\' 4"  (162.6 cm) Weight: 123 lb 3.8 oz (55.9 kg) IBW/kg (Calculated) : 54.7 Adjusted Body Weight:    Vital Signs: Temp: 97.9 F (36.6 C) (03/19 1206) Temp Source: Oral (03/19 1206) BP: 115/72 mmHg (03/19 1300) Pulse Rate: 87 (03/19 1300) Intake/Output from previous day: 03/18 0701 - 03/19 0700 In: [id number] [P.O.:830; I.V.:2300; IV Piggyback:175] Out: 1090 [Urine:1090] Intake/Output from this shift: Total I/O In: 1070 [P.O.:420; I.V.:600; IV Piggyback:50] Out: 655 [Urine:655]  Labs:  Recent Labs  12/02/15 0514 12/03/15 0430 12/04/15 0735  WBC 23.3* 27.6* 22.1*  HGB 11.0* 9.1* 7.6*  PLT 77* 91* 92*  CREATININE 2.35* 1.71* 1.37*   Estimated Creatinine Clearance: 33.5 mL/min (by C-G formula based on Cr of 1.37). No results for input(s): VANCOTROUGH, VANCOPEAK, VANCORANDOM, GENTTROUGH, GENTPEAK, GENTRANDOM, TOBRATROUGH, TOBRAPEAK, TOBRARND, AMIKACINPEAK, AMIKACINTROU, AMIKACIN in the last 72 hours.   Microbiology:   Medical History: Past Medical History  Diagnosis Date  . PVD (peripheral vascular disease) ([place])     status post prior femoral-popliteal bypass  . Back pain   . Arthritis   . CAD (coronary artery disease)     status post prior stenting to the proximal RCA, status post anterior STEMI 10/2008 (RCA occluded at the prior stent, LAD treated with a bare metal stent and a drug-eluting stent),  . Chronic systolic heart failure (HCC)     echo 11/2011: EF 25-30%, AS and apical AK, trivial AI, mild MR, PASP 34.  .  Anemia   . Ischemic cardiomyopathy   . AICD (automatic cardioverter/defibrillator) present   . HLD (hyperlipidemia)   . GERD (gastroesophageal reflux disease)   . RLS (restless legs syndrome)   . Depression   . Tobacco abuse   . Pain in joint, lower leg   . Depressive disorder, not elsewhere classified   . Acute myocardial infarction of other anterior wall, subsequent episode of care 2010  . CHF (congestive heart failure) ([place])   . COPD (chronic obstructive pulmonary disease) ([place])   . DVT (deep venous thrombosis) ([place])   . Mixed hyperlipidemia   . Gout   . Atherosclerosis of native arteries of the extremities with intermittent claudication   . Osteoarthritis   . Eczema   . Fall at home July 5,  and  Oct.  2015  . Cancer (HCC)     Skin  of Left  Face- " upper eye"5/15  . Hypertension   . Anginal pain ([place])   . Shortness of breath dyspnea     with exertion  . Chronic kidney disease     "years ago, saw Dr. Mcarthur Rossetti"  "no one has said anything recently"  . Stroke [facility])     before Heart attack, 2010  . Neuropathy, idiopathic     peripherial    Assessment: 70 y.o. female admitted on 12/04/2015 with intra-abdominal infection.   Afebrile. WBC 22.1 down. Scr 1.71>1.37 down with good UOP.  Goal of Therapy:  Eradication of infection  Plan:  Zosyn 3.375g IV q8h, dose ok down to a  CrCl of 20. Pharmacy will sign off. Please reconsult for further dosing assitance.   Alford Highland, [employer] 12/04/2015,1:45 PM

## 2015-12-04 NOTE — Plan of Care (Signed)
Patient ambulated 300 feet with WC and 2 assists, must drag, hold on to patient and guide pt around. Patient will  Not drink ensure and will not eat trays, will let family bring food from home.

## 2015-12-05 LAB — CBC
HEMATOCRIT: 22.4 % — AB (ref 36.0–46.0)
Hemoglobin: 7.2 g/dL — ABNORMAL LOW (ref 12.0–15.0)
MCH: 30 pg (ref 26.0–34.0)
MCHC: 32.1 g/dL (ref 30.0–36.0)
MCV: 93.3 fL (ref 78.0–100.0)
Platelets: 116 10*3/uL — ABNORMAL LOW (ref 150–400)
RBC: 2.4 MIL/uL — ABNORMAL LOW (ref 3.87–5.11)
RDW: 16.5 % — AB (ref 11.5–15.5)
WBC: 20.7 10*3/uL — ABNORMAL HIGH (ref 4.0–10.5)

## 2015-12-05 LAB — BASIC METABOLIC PANEL
Anion gap: 7 (ref 5–15)
BUN: 25 mg/dL — AB (ref 6–20)
CHLORIDE: 116 mmol/L — AB (ref 101–111)
CO2: 17 mmol/L — AB (ref 22–32)
CREATININE: 1.39 mg/dL — AB (ref 0.44–1.00)
Calcium: 8 mg/dL — ABNORMAL LOW (ref 8.9–10.3)
GFR calc Af Amer: 44 mL/min — ABNORMAL LOW (ref >60)
GFR calc non Af Amer: 38 mL/min — ABNORMAL LOW (ref >60)
Glucose, Bld: 125 mg/dL — ABNORMAL HIGH (ref 65–99)
Potassium: 3.9 mmol/L (ref 3.5–5.1)
Sodium: 140 mmol/L (ref 135–145)

## 2015-12-05 LAB — HEMOGLOBIN AND HEMATOCRIT, BLOOD
HCT: 25.4 % — ABNORMAL LOW (ref 36.0–46.0)
HEMOGLOBIN: 8.7 g/dL — AB (ref 12.0–15.0)

## 2015-12-05 LAB — PREPARE RBC (CROSSMATCH)

## 2015-12-05 LAB — OCCULT BLOOD X 1 CARD TO LAB, STOOL: Fecal Occult Bld: NEGATIVE

## 2015-12-05 MED ORDER — METOPROLOL SUCCINATE ER 50 MG PO TB24
50.0000 mg | ORAL_TABLET | Freq: Every day | ORAL | Status: DC
  Administered 2015-12-06 – 2015-12-11 (×6): 50 mg via ORAL
  Filled 2015-12-05 (×7): qty 1

## 2015-12-05 MED ORDER — SODIUM CHLORIDE 0.9 % IV SOLN: Freq: Once | INTRAVENOUS | Status: AC

## 2015-12-05 MED ORDER — FUROSEMIDE 10 MG/ML IJ SOLN
20.0000 mg | Freq: Once | INTRAMUSCULAR | Status: AC
  Administered 2015-12-05: 20 mg via INTRAVENOUS
  Filled 2015-12-05: qty 2

## 2015-12-05 NOTE — Care Management Important Message (Signed)
Important Message  Patient Details  Name: MELDOY GUFFEY MRN: [id number] Date of Birth: 07/04/1946   Medicare Important Message Given:  Yes    Barb Merino Kitt Minardi 12/05/2015, 2:49 PM

## 2015-12-05 NOTE — Progress Notes (Signed)
Physical Therapy Treatment Patient Details Name: [name] MRN: [id number] DOB: 09/07/1946 Today's Date: 12/05/2015    History of Present Illness Patient is a 70 y/o female with hx of COPD, DVT, CHF, HTN, stroke, depression, tobacco abuse, gout, HLD and CKD s/p Resection of Right Femoral Anastamotic Pseudoaneurysm with Dacron Patch Angioplasy Right Common Femoral Artery.     PT Comments    Patient continues to be very confused and demonstrates poor safety awareness. Requires Mod A for gait training today with poor balance. Incontinent of stool. Difficulty following simple 1 step commands at times and requires multimodal cues and increased time. Will continue to follow per current POC.   Follow Up Recommendations  SNF;Supervision/Assistance - 24 hour     Equipment Recommendations  None recommended by PT    Recommendations for Other Services       Precautions / Restrictions Precautions Precautions: Fall Precaution Comments: soft BP Restrictions Weight Bearing Restrictions: No    Mobility  Bed Mobility Overal bed mobility: Needs Assistance Bed Mobility: Supine to Sit     Supine to sit: Mod assist;HOB elevated     General bed mobility comments: Increased time and cues to get to EOB. Assist with BLEs, scooting bottom and to elevate trunk.   Transfers Overall transfer level: Needs assistance Equipment used: Rolling walker (2 wheeled) Transfers: Sit to/from Stand Sit to Stand: Mod assist         General transfer comment: Assist to stand from EOB x1, from chair x1 with manual cues for hand placement as pt not able to follow simple 1 step verbal commands. Posterior lean. Transferred to chair post ambulation.  Ambulation/Gait Ambulation/Gait assistance: Mod assist Ambulation Distance (Feet): 100 Feet Assistive device: Rolling walker (2 wheeled) Gait Pattern/deviations: Step-to pattern;Decreased stride length;Narrow base of support;Leaning posteriorly   Gait  velocity interpretation: Below normal speed for age/gender General Gait Details: poor awareness throughout gait with difficulty negotiating RW. Pt with posterior lean and lean to the right. Constant MOd A for balance. Pt incontinent of stool during ambulation.    Stairs            Wheelchair Mobility    Modified Rankin (Stroke Patients Only)       Balance Overall balance assessment: Needs assistance Sitting-balance support: Feet supported;No upper extremity supported Sitting balance-Leahy Scale: Fair     Standing balance support: During functional activity;Bilateral upper extremity supported Standing balance-Leahy Scale: Poor                      Cognition Arousal/Alertness: Awake/alert Behavior During Therapy: Flat affect Overall Cognitive Status: Impaired/Different from baseline   Orientation Level: Disoriented to;Time       Safety/Judgement: Decreased awareness of deficits;Decreased awareness of safety   Problem Solving: Slow processing;Requires verbal cues      Exercises      General Comments General comments (skin integrity, edema, etc.): Pt incontient of stool during ambulation. Total A for peri care, changing gown, socks etc. RN present in room.      Pertinent Vitals/Pain Pain Assessment: Faces Faces Pain Scale: Hurts little more Pain Location: right groin Pain Descriptors / Indicators: Sore Pain Intervention(s): Monitored during session;Repositioned    Home Living                      Prior Function            PT Goals (current goals can now be found in the care plan section) Progress  towards PT goals: Progressing toward goals    Frequency  Min 3X/week    PT Plan Current plan remains appropriate    Co-evaluation             End of Session Equipment Utilized During Treatment: Gait belt Activity Tolerance: Patient limited by lethargy Patient left: in chair;with call bell/phone within reach;with chair alarm  set;with nursing/sitter in room     Time: 1145-1220 PT Time Calculation (min) (ACUTE ONLY): 35 min  Charges:  $Gait Training: 8-22 mins $Therapeutic Activity: 8-22 mins                    G Codes:      Morse Brueggemann A Zelma Mazariego 12/05/2015, 12:43 PM Wray Kearns, [place], DPT [phone]

## 2015-12-05 NOTE — Progress Notes (Signed)
Patient ate about 30% of breakfast. Husband stated that he would get her something from downstairs to eat for lunch that she likes. PT/OT called and will be seeing patient this morning.

## 2015-12-05 NOTE — Progress Notes (Signed)
Patient denied any pain during my shift. Patient is alert but oriented to self only and didn't try to get out of bed. Patient slept well.

## 2015-12-05 NOTE — Care Management Note (Signed)
Case Management Note  Patient Details  Name: [name] MRN: [id number] Date of Birth: 28-Feb-1946  Subjective/Objective:   Pt lives with spouse, has supportive dtr who checks on her frequently.  Discussed PT/OT recommendations for SNF for rehab and pt adamantly refuses.  Dtr thinks pt will be able to manage @ home with support from home health PT/OT and aide.  Provided list of home health agencies to dtr and referral made to [facility] per choice.  Pt states she has an old walker that she bought @ a [employer], feels she would benefit from a rollator when discharged.                             Expected Discharge Plan:  [place]  Discharge planning Services  CM Consult  Post Acute Care Choice:  Home Health Choice offered to:  Adult Children  HH Arranged:  PT, OT, aide [place] Agency:  [place]  Status of Service:  In process, will continue to follow  Medicare Important Message Given:  Yes  Girard Cooter, RN 12/05/2015, 2:52 PM

## 2015-12-05 NOTE — Progress Notes (Signed)
Pt received from [facility]. Pt tearful in pain in L flank. Pt is confused asked for people who are not in the room. Pt situated in bed, bed alarm on, call light within reach. Pt given PRN pain medication. Will continue to monitor.   Fritz Pickerel, RN

## 2015-12-05 NOTE — Progress Notes (Signed)
1535 Report called to RN Verline Lema on [facility].  Daughter Freda Munro made aware of transfer.  Richardean Canal RN, BSN, CCRN

## 2015-12-05 NOTE — Progress Notes (Signed)
Initial Nutrition Assessment  DOCUMENTATION CODES:   Non-severe (moderate) malnutrition in context of chronic illness  INTERVENTION:   - Discontinue Ensure Enlive. - Provide Magic Cups on meal trays, each supplement provides 290 kcals and 9 grams of protein. - Encourage good PO intake of meals and supplements.   NUTRITION DIAGNOSIS:   Malnutrition related to chronic illness as evidenced by moderate depletion of body fat, moderate depletions of muscle mass.  GOAL:   Patient will meet greater than or equal to 90% of their needs  MONITOR:   PO intake, Supplement acceptance, Labs, Weight trends, Skin  REASON FOR ASSESSMENT:   Malnutrition Screening Tool    ASSESSMENT:   70 y.o. female who presents for evaluation of pain in right inguinal area. Patient noted a "knot" in the right inguinal area were previous bypasses have been performed and this has slowly enlarged over the last several days with increasing discomfort. Pt continues to have a heavy tobacco abuse problem smoking 1 pack per day. PMH of a femoral-femoral bypass graft, bilateral infrainguinal bypass grafts, CAD, chronic systolic HF, HTN, HLD, and CKD stage III.    3/15 - S/p resection of right femoral anastamotic pseudoaneurysm with Dacron patch angioplasty right common femoral artery  Spoke to patient and daughter who reports that patient has not been eating a lot recently.  States that it is normal for patient to "eat like a bird throughout the day".  Pt does not eat many meals, rather she has small snacks.  Daughter reports that patient does not like the heart healthy diet food delivered from the cafeteria and she has been bringing her fast food from outside the hospital.  Daughter had brought patient [employer] for lunch today.  Patient denies abdominal pain or nausea currently.  States that she strongly dislikes Ensure and has not been consuming them, will discontinue order.  Patient reports that she loves ice cream and is  amenable to trying Magic Cups with her meal trays.   Nutrition Focused Physical Exam was conducted.  Findings include mild to moderate fat and muscle depletion and no edema.  Daughter reports that her mother has gradually lost 1-2 lbs here and there over a year or so. Patient reports that her body weight has been around 104# recently.  According to chart review, patient has lost 10 lbs / 9% over 6 months, which is not significant for the time period.   Medications reviewed: ferrous sulfate, lasix, protonix.  Labs reviewed: elevated BUN/creatinine, elevated glucose (108-125).  Diet Order:  Diet Heart Room service appropriate?: Yes; Fluid consistency:: Thin  Skin:  Wound (see comment) (Closed incision on R groin area)  Last BM:  3/19  Height:   Ht Readings from Last 1 Encounters:  11/18/2015 5\' 4"  (1.626 m)    Weight:   Wt Readings from Last 1 Encounters:  12/04/15 123 lb 3.8 oz (55.9 kg)    Ideal Body Weight:  54.5 kg  BMI:  Body mass index is 21.14 kg/(m^2).  Estimated Nutritional Needs:   Kcal:  1400-1600  Protein:  70-80 grams  Fluid:  >/= 1.5 L  EDUCATION NEEDS:   No education needs identified at this time  Veronda Prude, Dietetic Intern Pager: [phone]

## 2015-12-05 NOTE — Progress Notes (Addendum)
  Progress Note    12/05/2015 9:15 AM 5 Days Post-Op  Subjective:  No complaints-tolerating diet; husband says she still hallucinating occasionally  Afebrile HR  80's-90's NSR [id number] systolic 0000000 RA  Filed Vitals:   12/05/15 0700 12/05/15 0753  BP: 117/81 119/82  Pulse: 94 97  Temp:  97.9 F (36.6 C)  Resp: 30 35    Physical Exam: Cardiac:  regular Lungs:  Non labored Incisions:  Right groin is soft and clean and dry without hematoma Extremities:  Easily palpable fem fem graft pulse Abdomen:  Soft, NT/ND; tolerating diet.  CBC    Component Value Date/Time   WBC 20.7* 12/05/2015 0425   RBC 2.40* 12/05/2015 0425   HGB 7.2* 12/05/2015 0425   HCT 22.4* 12/05/2015 0425   PLT 116* 12/05/2015 0425   MCV 93.3 12/05/2015 0425   MCH 30.0 12/05/2015 0425   MCHC 32.1 12/05/2015 0425   RDW 16.5* 12/05/2015 0425   LYMPHSABS 1.9 05/09/2015 0941   MONOABS 0.7 05/09/2015 0941   EOSABS 0.1 05/09/2015 0941   BASOSABS 0.1 05/09/2015 0941    BMET    Component Value Date/Time   NA 140 12/05/2015 0425   K 3.9 12/05/2015 0425   CL 116* 12/05/2015 0425   CO2 17* 12/05/2015 0425   GLUCOSE 125* 12/05/2015 0425   BUN 25* 12/05/2015 0425   CREATININE 1.39* 12/05/2015 0425   CALCIUM 8.0* 12/05/2015 0425   GFRNONAA 38* 12/05/2015 0425   GFRAA 44* 12/05/2015 0425    INR    Component Value Date/Time   INR 1.17 12/07/2015 2028     Intake/Output Summary (Last 24 hours) at 12/05/15 0915 Last data filed at 12/05/15 0700  Gross per 24 hour  Intake   2890 ml  Output    961 ml  Net   1929 ml     Assessment:  70 y.o. female is s/p:  Resection of Right Femoral Anastamotic Pseudoaneurysm with Dacron Patch Angioplasy Right Common Femoral Artery   5 Days Post-Op  Plan: -pt feeling better this morning-tolerating diet -easily palpable fem fem graft pulse -WBC trending downward & pt has been afebrile-continue Zosyn.  Blood cultures negative to date -probable ICU  psychosis-transfer to 2 west -acute blood loss anemia-hgb continues to trend downward.  She does have a hx of MI in the past and EF of 25-30% on last echo in 2013.  May need transfusion-will d/w Dr. Kellie Simmering.  (received 2 units of (PRBC's on 12/01/15) -creatinine is stable at 1.37 -right groin is clean and dry. -DVT prophylaxis:  SCD's -continue mobilization and OOB   Leontine Locket, PA-C [facility] [phone] 12/05/2015 9:15 AM agree with above assessment Abdomen is soft and nontender Tolerating diet Right inguinal wound healing satisfactorily with small hematoma 3+ popliteal pulse palpable right leg  Will transfuse one unit of packed red blood cells today and then diurese Transferred to [place] and begin increasing ambulation Hopefully we can mobilize her today and tomorrow and be ready to DC her home on Wednesday Will have case manager evaluate to see if stay in skilled nursing facility would be necessary following discharge

## 2015-12-06 LAB — BASIC METABOLIC PANEL
Anion gap: 9 (ref 5–15)
BUN: 29 mg/dL — AB (ref 6–20)
CALCIUM: 8.5 mg/dL — AB (ref 8.9–10.3)
CO2: 18 mmol/L — ABNORMAL LOW (ref 22–32)
Chloride: 115 mmol/L — ABNORMAL HIGH (ref 101–111)
Creatinine, Ser: 1.64 mg/dL — ABNORMAL HIGH (ref 0.44–1.00)
GFR calc Af Amer: 36 mL/min — ABNORMAL LOW (ref >60)
GFR, EST NON AFRICAN AMERICAN: 31 mL/min — AB (ref >60)
GLUCOSE: 111 mg/dL — AB (ref 65–99)
POTASSIUM: 4.1 mmol/L (ref 3.5–5.1)
SODIUM: 142 mmol/L (ref 135–145)

## 2015-12-06 LAB — TYPE AND SCREEN
ABO/RH(D): B NEG
Antibody Screen: NEGATIVE
Unit division: 0

## 2015-12-06 LAB — CBC
HEMATOCRIT: 27.1 % — AB (ref 36.0–46.0)
Hemoglobin: 8.6 g/dL — ABNORMAL LOW (ref 12.0–15.0)
MCH: 29.4 pg (ref 26.0–34.0)
MCHC: 31.7 g/dL (ref 30.0–36.0)
MCV: 92.5 fL (ref 78.0–100.0)
PLATELETS: 118 10*3/uL — AB (ref 150–400)
RBC: 2.93 MIL/uL — ABNORMAL LOW (ref 3.87–5.11)
RDW: 16.8 % — AB (ref 11.5–15.5)
WBC: 19.2 10*3/uL — AB (ref 4.0–10.5)

## 2015-12-06 NOTE — Progress Notes (Addendum)
  [facility] Progress Note  Subjective  - POD #1  Sleeping comfortably. Daughter and husband in the room.   Objective Filed Vitals:   12/05/15 2012 12/06/15 0453  BP: 120/76 129/73  Pulse: 74 79  Temp: 98.5 F (36.9 C) 97.5 F (36.4 C)  Resp: 20 18    Intake/Output Summary (Last 24 hours) at 12/06/15 0746 Last data filed at 12/05/15 2238  Gross per 24 hour  Intake    915 ml  Output    530 ml  Net    385 ml   Palpable fem-fem pulse.  Right groin incision clean and intact. Ecchymosis right thigh and pubic area.  Abdomen soft and non tender.   Assessment/Planning: 70 y.o. 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  Blood cultures negative to date. WBC trending down. Continue zosyn.  Needs to mobilize and get OOB today.  Hopefully confusion improves today being on the floor.    Alvia Grove 12/06/2015 7:46 AM --  Laboratory CBC    Component Value Date/Time   WBC 19.2* 12/06/2015 0253   HGB 8.6* 12/06/2015 0253   HCT 27.1* 12/06/2015 0253   PLT 118* 12/06/2015 0253    BMET    Component Value Date/Time   NA 142 12/06/2015 0328   K 4.1 12/06/2015 0328   CL 115* 12/06/2015 0328   CO2 18* 12/06/2015 0328   GLUCOSE 111* 12/06/2015 0328   BUN 29* 12/06/2015 0328   CREATININE 1.64* 12/06/2015 0328   CALCIUM 8.5* 12/06/2015 0328   GFRNONAA 31* 12/06/2015 0328   GFRAA 36* 12/06/2015 0328    COAG Lab Results  Component Value Date   INR 1.17    INR 1.04 03/21/2014   INR 0.89 09/15/2013   No results found for: PTT  Antibiotics Anti-infectives    Start     Dose/Rate Route Frequency Ordered Stop   12/03/15 1000  piperacillin-tazobactam (ZOSYN) IVPB 3.375 g     3.375 g 12.5 mL/hr over 240 Minutes Intravenous Every 8 hours 12/03/15 0944     12/01/15 0400  cefUROXime (ZINACEF) 1.5 g  in dextrose 5 % 50 mL IVPB     1.5 g 100 mL/hr over 30 Minutes Intravenous Every 12 hours 12/01/15 0057 12/01/15 1742       Virgina Jock, PA-C [facility] Office: [phone] Pager: [phone] 12/06/2015 7:46 AM  Agree with above assessment Surgical incision in right inguinal area is healing nicely with resolving ecchymosis Right foot adequately perfused with 2+ popliteal pulse palpable Patient continues to be quite weak as she was preoperatively according to husband She is taking diet regularly according to husband  Discuss possibility of skilled nursing facility prior to DC home but husband and patient are opposed to this We will continue to work on mobilizing patient and hopefully be able to DC home in the next few days White blood count continues to trend down at 19,000 Hematocrit slightly improved at 27%  Will check CBC and the met in a.m. and decide about transferring 1 additional unit packed red blood cells tomorrow

## 2015-12-06 NOTE — Progress Notes (Signed)
Utilization review completed.  

## 2015-12-06 NOTE — Progress Notes (Signed)
Occupational Therapy Treatment Patient Details Name: [name] MRN: [id number] DOB: Jul 12, 1946 Today's Date: 12/06/2015    History of present illness Patient is a 70 y/o female with hx of COPD, DVT, CHF, HTN, stroke, depression, tobacco abuse, gout, HLD and CKD s/p Resection of Right Femoral Anastamotic Pseudoaneurysm with Dacron Patch Angioplasy Right Common Femoral Artery.    OT comments  Pt agreeable to OOB. Educated pt in importance of activity in preparation for return home. Pt stating her husband could just carry her.  Pt with very slow processing. Did not recall having surgery. Husband frustrated with pt's slow progress and not wanting to eat. Assisted pt to place lunch order after prompts to identify what she may want to eat. Encouraged/performed incentive spirometer, pt with wheezing, RN aware.  Follow Up Recommendations  SNF;Supervision/Assistance - 24 hour    Equipment Recommendations       Recommendations for Other Services      Precautions / Restrictions Precautions Precautions: Fall       Mobility Bed Mobility Overal bed mobility: Needs Assistance Bed Mobility: Rolling;Sidelying to Sit Rolling: Mod assist Sidelying to sit: Mod assist       General bed mobility comments: used log roll technique to decrease pain, needs step by step cues  Transfers Overall transfer level: Needs assistance Equipment used: 1 person hand held assist Transfers: Sit to/from [employer] Transfers Sit to Stand: Mod assist Stand pivot transfers: Mod assist       General transfer comment: stood from bed and 3 in1    Balance Overall balance assessment: Needs assistance Sitting-balance support: Feet supported Sitting balance-Leahy Scale: Poor       Standing balance-Leahy Scale: Poor                     ADL Overall ADL's : Needs assistance/impaired     Grooming: Wash/dry hands;Wash/dry face;Minimal assistance;Brushing hair;Sitting       Lower Body  Bathing: Maximal assistance;Sitting/lateral leans       Lower Body Dressing: Maximal assistance;Sit to/from stand   Toilet Transfer: Moderate assistance;Stand-pivot;BSC   Toileting- Clothing Manipulation and Hygiene: Sit to/from stand;Moderate assistance         General ADL Comments: Pt with poor PO intake. RN placed regular diet order and OT assisted pt with ordering lunch foods. Husband frustrated with pt's decreased progress.      Vision                     Perception     Praxis      Cognition   Behavior During Therapy: Flat affect Overall Cognitive Status: Impaired/Different from baseline Area of Impairment: Orientation;Safety/judgement;Problem solving Orientation Level: Disoriented to;Situation;Time        Safety/Judgement: Decreased awareness of deficits;Decreased awareness of safety   Problem Solving: Slow processing;Requires verbal cues      Extremity/Trunk Assessment               Exercises     Shoulder Instructions       General Comments      Pertinent Vitals/ Pain       Pain Assessment: Faces Faces Pain Scale: Hurts little more Pain Location: abdomen Pain Descriptors / Indicators: Guarding;Grimacing Pain Intervention(s): Monitored during session;Repositioned  Home Living  Prior Functioning/Environment              Frequency Min 2X/week     Progress Toward Goals  OT Goals(current goals can now be found in the care plan section)  Progress towards OT goals: Not progressing toward goals - comment (pt with impaired cognition)     Plan Discharge plan remains appropriate    Co-evaluation                 End of Session Equipment Utilized During Treatment: Gait belt   Activity Tolerance Patient tolerated treatment well   Patient Left in chair;with call bell/phone within reach;with chair alarm set;with nursing/sitter in room   Nurse Communication   (bleeding from L LE with transfer, RN changed dressing)        [id number] OT Time Calculation (min): 55 min  Charges: OT General Charges $OT Visit: 1 Procedure OT Treatments $Self Care/Home Management : 53-67 mins  Malka So 12/06/2015, 10:45 AM  [phone]

## 2015-12-07 ENCOUNTER — Inpatient Hospital Stay (HOSPITAL_COMMUNITY): Payer: Commercial Managed Care - HMO

## 2015-12-07 DIAGNOSIS — I729 Aneurysm of unspecified site: Secondary | ICD-10-CM

## 2015-12-07 DIAGNOSIS — N179 Acute kidney failure, unspecified: Secondary | ICD-10-CM | POA: Diagnosis present

## 2015-12-07 DIAGNOSIS — R5381 Other malaise: Secondary | ICD-10-CM

## 2015-12-07 DIAGNOSIS — D72829 Elevated white blood cell count, unspecified: Secondary | ICD-10-CM

## 2015-12-07 DIAGNOSIS — J189 Pneumonia, unspecified organism: Secondary | ICD-10-CM

## 2015-12-07 LAB — FERRITIN: Ferritin: 139 ng/mL (ref 11–307)

## 2015-12-07 LAB — IRON AND TIBC
Iron: 49 ug/dL (ref 28–170)
SATURATION RATIOS: 19 % (ref 10.4–31.8)
TIBC: 258 ug/dL (ref 250–450)
UIBC: 209 ug/dL

## 2015-12-07 LAB — BASIC METABOLIC PANEL
ANION GAP: 11 (ref 5–15)
BUN: 34 mg/dL — AB (ref 6–20)
CALCIUM: 8.6 mg/dL — AB (ref 8.9–10.3)
CO2: 19 mmol/L — ABNORMAL LOW (ref 22–32)
Chloride: 113 mmol/L — ABNORMAL HIGH (ref 101–111)
Creatinine, Ser: 1.93 mg/dL — ABNORMAL HIGH (ref 0.44–1.00)
GFR calc Af Amer: 29 mL/min — ABNORMAL LOW (ref >60)
GFR, EST NON AFRICAN AMERICAN: 25 mL/min — AB (ref >60)
Glucose, Bld: 85 mg/dL (ref 65–99)
POTASSIUM: 4.5 mmol/L (ref 3.5–5.1)
SODIUM: 143 mmol/L (ref 135–145)

## 2015-12-07 LAB — CULTURE, BLOOD (ROUTINE X 2)
Culture: NO GROWTH
Culture: NO GROWTH

## 2015-12-07 LAB — URINALYSIS, ROUTINE W REFLEX MICROSCOPIC
Bilirubin Urine: NEGATIVE
Glucose, UA: NEGATIVE mg/dL
KETONES UR: 15 mg/dL — AB
NITRITE: NEGATIVE
PH: 5.5 (ref 5.0–8.0)
Protein, ur: 30 mg/dL — AB
Specific Gravity, Urine: 1.027 (ref 1.005–1.030)

## 2015-12-07 LAB — URINE MICROSCOPIC-ADD ON

## 2015-12-07 LAB — RETICULOCYTES
RBC.: 3.37 MIL/uL — AB (ref 3.87–5.11)
Retic Count, Absolute: 151.7 10*3/uL (ref 19.0–186.0)
Retic Ct Pct: 4.5 % — ABNORMAL HIGH (ref 0.4–3.1)

## 2015-12-07 LAB — PROTIME-INR
INR: 1.23 (ref 0.00–1.49)
Prothrombin Time: 15.7 seconds — ABNORMAL HIGH (ref 11.6–15.2)

## 2015-12-07 LAB — CBC
HCT: 27.2 % — ABNORMAL LOW (ref 36.0–46.0)
Hemoglobin: 8.7 g/dL — ABNORMAL LOW (ref 12.0–15.0)
MCH: 29.8 pg (ref 26.0–34.0)
MCHC: 32 g/dL (ref 30.0–36.0)
MCV: 93.2 fL (ref 78.0–100.0)
PLATELETS: 123 10*3/uL — AB (ref 150–400)
RBC: 2.92 MIL/uL — AB (ref 3.87–5.11)
RDW: 17.1 % — ABNORMAL HIGH (ref 11.5–15.5)
WBC: 19.7 10*3/uL — AB (ref 4.0–10.5)

## 2015-12-07 LAB — VITAMIN B12: VITAMIN B 12: 193 pg/mL (ref 180–914)

## 2015-12-07 LAB — STREP PNEUMONIAE URINARY ANTIGEN: STREP PNEUMO URINARY ANTIGEN: NEGATIVE

## 2015-12-07 LAB — FOLATE: FOLATE: 12.2 ng/mL (ref >5.9)

## 2015-12-07 MED ORDER — DEXTROSE 5 % IV SOLN
1.0000 g | INTRAVENOUS | Status: DC
  Filled 2015-12-07: qty 1

## 2015-12-07 MED ORDER — MAGNESIUM HYDROXIDE 400 MG/5ML PO SUSP: 30.0000 mL | Freq: Every day | ORAL | Status: DC | PRN

## 2015-12-07 MED ORDER — SODIUM CHLORIDE 0.9 % IV SOLN
INTRAVENOUS | Status: DC
  Administered 2015-12-07: 09:00:00 via INTRAVENOUS

## 2015-12-07 MED ORDER — BISACODYL 10 MG RE SUPP
10.0000 mg | Freq: Once | RECTAL | Status: AC
  Administered 2015-12-07: 10 mg via RECTAL
  Filled 2015-12-07: qty 1

## 2015-12-07 MED ORDER — SODIUM CHLORIDE 0.9 % IV SOLN: INTRAVENOUS | Status: DC

## 2015-12-07 MED ORDER — CLONAZEPAM 1 MG PO TABS
1.0000 mg | ORAL_TABLET | Freq: Every day | ORAL | Status: DC
  Administered 2015-12-07 – 2015-12-11 (×5): 1 mg via ORAL
  Filled 2015-12-07 (×5): qty 1

## 2015-12-07 MED ORDER — VANCOMYCIN HCL IN DEXTROSE 750-5 MG/150ML-% IV SOLN
750.0000 mg | INTRAVENOUS | Status: DC
  Administered 2015-12-09: 750 mg via INTRAVENOUS
  Filled 2015-12-07: qty 150

## 2015-12-07 MED ORDER — DEXTROSE 5 % IV SOLN
1.0000 g | INTRAVENOUS | Status: DC
  Administered 2015-12-07: 1 g via INTRAVENOUS
  Filled 2015-12-07 (×2): qty 1

## 2015-12-07 MED ORDER — HYDROCODONE-ACETAMINOPHEN 10-325 MG PO TABS
1.0000 | ORAL_TABLET | Freq: Four times a day (QID) | ORAL | Status: DC | PRN
  Administered 2015-12-07 – 2015-12-12 (×10): 1 via ORAL
  Filled 2015-12-07 (×11): qty 1

## 2015-12-07 MED ORDER — VANCOMYCIN HCL IN DEXTROSE 1-5 GM/200ML-% IV SOLN
1000.0000 mg | Freq: Once | INTRAVENOUS | Status: AC
  Administered 2015-12-07: 1000 mg via INTRAVENOUS
  Filled 2015-12-07: qty 200

## 2015-12-07 NOTE — Progress Notes (Signed)
Pt ambulated 26ft with Pt very resistant to ambulation, but did with much encouragement. Pt returned to chair for dinner. Call bell within reach. Will continue to monitor.

## 2015-12-07 NOTE — Progress Notes (Signed)
Pharmacy Antibiotic Note  [name] is a 70 y.o. female POD #7 (resection of right femoral anastomotic pseudoaneurysm) noted with AKI and concern for PNA.  Pharmacy has been consulted for vancomcyin dosing (LOT 8 days) -WBC= 19/7, afebrile, SCr= 1.93 (trend up; baseline 1.1-1.4); CrCl ~ 20-25.   Plan: -Change cefepime to 1gm IV q24h -Vancomycin 1000mg  IV followed by 750mg  IV q48hr (goal trough= 15-20 -Will follow renal function, cultures and clinical progress   Height: 5\' 4"  (162.6 cm) Weight: 123 lb 3.8 oz (55.9 kg) IBW/kg (Calculated) : 54.7  Temp (24hrs), Avg:98.3 F (36.8 C), Min:97.6 F (36.4 C), Max:98.7 F (37.1 C)   Recent Labs Lab 12/03/15 0430 12/04/15 0735 12/05/15 0425 12/06/15 0253 12/06/15 0328 12/07/15 0330  WBC 27.6* 22.1* 20.7* 19.2*  --  19.7*  CREATININE 1.71* 1.37* 1.39*  --  1.64* 1.93*    Estimated Creatinine Clearance: 23.8 mL/min (by C-G formula based on Cr of 1.93).    Allergies  Allergen Reactions  . Morphine     Other reaction(s): Other (See Comments) hallucinations REACTION: makes me crazy  . Gabapentin Other (See Comments)    300 mg upsets her stomach and 100 mg affect her sleep  . Naproxen Other (See Comments)    Blurred vision  . Prednisone Other (See Comments)    cranky    Antimicrobials this admission: 3/22 vanc>> 3/22 cefepime>>  Dose adjustments this admission: n/a  Microbiology results: 3/17 blood x- ngtd 3/16 MRSA PCR - neg  Hildred Laser, Pharm D 12/07/2015 2:27 PM

## 2015-12-07 NOTE — Progress Notes (Addendum)
[facility] Progress Note  Subjective  - POD #7  Having right abdominal pain this am. Having nausea. Passing flatus.   Objective Filed Vitals:   12/06/15 1955 12/07/15 0449  BP: 117/73 130/74  Pulse: 72 74  Temp: 98.6 F (37 C) 98.7 F (37.1 C)  Resp: 18 18    Intake/Output Summary (Last 24 hours) at 12/07/15 0757 Last data filed at 12/07/15 0504  Gross per 24 hour  Intake    240 ml  Output    500 ml  Net   -260 ml   Abdomen guarding, diffusely tender to palpation. No distension. Normoactive bowel sounds.  Right groin incision clean and intact. Ecchymosis improving.  Palpable right popliteal pulse.   Assessment/Planning: 70 y.o. 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  -Having right abdominal pain (has had previous cholecystectomy). CT from 12/03/15 unremarkable. Afebrile. Leukocytosis of 19.7. Continue zosyn.  -Blood cultures negative.  -Has not had BM in 3 days. Will give suppository today.  -H/H: 8.7/27, will ask Dr. Kellie Simmering about transfusion.  -Acute on chronic CKD 3: low UOP at 530 yesterday. Creatinine trending up to 1.93. Will restart IVF.  -Needs to mobilize in halls.   Will consult GI for abdominal pain and medicine service for medical issues. She is stable from a surgical standpoint.   Alvia Grove 12/07/2015 7:57 AM --  Laboratory CBC    Component Value Date/Time   WBC 19.7* 12/07/2015 0330   HGB 8.7* 12/07/2015 0330   HCT 27.2* 12/07/2015 0330   PLT 123* 12/07/2015 0330    BMET    Component Value Date/Time   NA 143 12/07/2015 0330   K 4.5 12/07/2015 0330   CL 113* 12/07/2015 0330   CO2 19* 12/07/2015 0330   GLUCOSE 85 12/07/2015 0330   BUN 34* 12/07/2015 0330   CREATININE 1.93* 12/07/2015 0330   CALCIUM 8.6* 12/07/2015 0330   GFRNONAA 25* 12/07/2015 0330   GFRAA 29* 12/07/2015 0330     COAG Lab Results  Component Value Date   INR 1.17 11/16/2015   INR 1.04 03/21/2014   INR 0.89 09/15/2013   No results found for: PTT  Antibiotics Anti-infectives    Start     Dose/Rate Route Frequency Ordered Stop   12/03/15 1000  piperacillin-tazobactam (ZOSYN) IVPB 3.375 g     3.375 g 12.5 mL/hr over 240 Minutes Intravenous Every 8 hours 12/03/15 0944     12/01/15 0400  cefUROXime (ZINACEF) 1.5 g in dextrose 5 % 50 mL IVPB     1.5 g 100 mL/hr over 30 Minutes Intravenous Every 12 hours 12/01/15 0057 12/01/15 [place], PA-C [facility] Office: [phone] Pager: [phone] 12/07/2015 7:57 AM  Patient continues to make very slow progress Right inguinal wound is healing nicely with 3+ pulse and well-perfused right foot Patient continues to complain of abdominal discomfort-etiology unknown Remains afebrile and is on Zosyn and Zinacef  Do not know etiology of GI complaints which is what is preventing patient from progressing She has had progressive weakness over the past few years according to husband and has multiple complaints  Will get GI consult and also go ask hospitalist service to get involved Problems at this point are purely medical problems and not related to her surgery We'll appreciate any help from these consultants

## 2015-12-07 NOTE — Consult Note (Signed)
   [facility] CM Inpatient Consult   12/07/2015  [name] [birth year] [id number] Patient screened for potential [place] [facility] services. Patient is eligible for Kim. Epic reveals patient's discharge plan has been recommended for skilled nursing post hospital. Spoke with patient at the bedside.  Patient states she is weak and not feeling particularly well today.  She states she lives with her family "well, part of the time."  She states she is unsure of her post hospital plan.  Explained the [place] [facility] as a benefit of her [facility] and primary care provider, Dr. Shirline Frees. Spoke with inpatient RNCM regarding patient eligibility and to refer the patient if follow up will be beneficial for post discharge monitoring for community needs.  For questions, pl;ease contact: Natividad Brood, RN BSN [facility] Liaison  [phone] business mobile phone Toll free office [phone]

## 2015-12-07 NOTE — Consult Note (Signed)
[facility] Medical Consultation  [name] [id number] DOB: 31-May-1946 DOA: 11/22/2015 PCP: Shirline Frees, MD   Requesting physician: Kellie Simmering Date of consultation: 12/07/15 Reason for consultation: aki  Impression/Recommendations Principal Problem:   AKI (acute kidney injury) ([place]) Active Problems:   ANEMIA NEC   Essential hypertension   CKD (chronic kidney disease)   False aneurysm (HCC)   Pseudoaneurysm of right femoral artery (HCC)   Ischemic chest pain ([place])   Coronary artery disease due to lipid rich plaque   Cardiomyopathy, ischemic   Leukocytosis   1. Acute on chronic kidney injury. Stage II. Likely related to dehydration in the setting of recent diuretics. Creatinine today 1.9. Chart review indicates baseline likely 1.3.  -Gentle IV fluids -Hold nephrotoxins -Monitor urine output -Recheck in the morning  #2. Leukocytosis. Etiology unclear. WBC count spiked her days ago. Trending down now. She has remained afebrile hemodynamically stable and nontoxic appearing. Recent urinalysis and chest x-ray unremarkable. Recent blood cultures no growth Medications reviewed and no steroids administered Zosyn and Zinacef started 318.  -We'll repeat urinalysis -We'll repeat chest x-ray -Remove Foley catheter -Incentive spirometry hourly -Mobilize patient  #3. Anemia. History of IDA in setting of resection of right femoral Anastamotic pseudoaneurysm with dacron patch. Home medications include iron. Hemoglobin trending down since surgery. Chart review indicates recent transfusion per vascular. Chart review indicates baseline hemoglobin 12. Currently hemoglobin 8.7.  No Signs symptoms of active bleeding. -Obtain an anemia panel -FOBT -Defer transfusion to vascular service -Recheck in the morning  #4. Abdominal pain/constipation. No BM and at least 3 days reportedly. She received a suppository today -Milk of mag daily -GI consult per vascular -Minimize  narcotics -Mobilize -If no improvement consider milk of molasses enema  #5. Cardiomyopathy ischemic/chronic systolic heart failure. Chart review indicates echo 2013 with an EF of 25-30%, a S and apical AK. His not appear overloaded. Chart review indicates she received some Lasix after transfusion 2 days ago. Medications include metoprolol and Lasix.  -We'll obtain daily weights -On its her intake and output -Obtain 2-D echo -Monitor closely given need for some gentle IV fluids.  6. Hypertension. Controlled. Home medications include Lasix, Toprol, ramipril -continue BB -monitor  TRH will follow again tomorrow. Please contact if I can be of assistance in the meanwhile. Thank you for this consultation.  Chief Complaint: medical management  HPI: [name] without 70 year old past medical history CAD, chronic systolic heart failure, anemia, ASCVD, tobacco use, CHF . Admitted by vascular with severe PAD and underwent right femoral pseudoaneurysm expansion on its 15th. Since that time she's had slow progression. Her chronic kidney disease slightly worsening as well as her chronic IDA. She has had a transfusion 2 days ago. She complains of some abdominal pain but denies nausea and vomiting. She reports "I don't care" when asked when her last bowel movement was. She denies chest pain palpitations headache dizziness syncope or near-syncope. She denies shortness of breath or extremity edema.   Review of Systems:  10 point review of systems complete and all systems are negative except as indicated in the history of present illness  Past Medical History  Diagnosis Date  . PVD (peripheral vascular disease) ([place])     status post prior femoral-popliteal bypass  . Back pain   . Arthritis   . CAD (coronary artery disease)     status post prior stenting to the proximal RCA, status post anterior STEMI 10/2008 (RCA occluded at the prior stent, LAD treated with a bare metal stent  and a drug-eluting stent),  .  Chronic systolic heart failure (HCC)     echo 11/2011: EF 25-30%, AS and apical AK, trivial AI, mild MR, PASP 34.  . Anemia   . Ischemic cardiomyopathy   . AICD (automatic cardioverter/defibrillator) present   . HLD (hyperlipidemia)   . GERD (gastroesophageal reflux disease)   . RLS (restless legs syndrome)   . Depression   . Tobacco abuse   . Pain in joint, lower leg   . Depressive disorder, not elsewhere classified   . Acute myocardial infarction of other anterior wall, subsequent episode of care 2010  . CHF (congestive heart failure) ([place])   . COPD (chronic obstructive pulmonary disease) ([place])   . DVT (deep venous thrombosis) ([place])   . Mixed hyperlipidemia   . Gout   . Atherosclerosis of native arteries of the extremities with intermittent claudication   . Osteoarthritis   . Eczema   . Fall at home July 5,  and  Oct.  2015  . Cancer (HCC)     Skin  of Left  Face- " upper eye"5/15  . Hypertension   . Anginal pain ([place])   . Shortness of breath dyspnea     with exertion  . Chronic kidney disease     "years ago, saw Dr. Mcarthur Rossetti"  "no one has said anything recently"  . Stroke [facility])     before Heart attack, 2010  . Neuropathy, idiopathic     peripherial   Past Surgical History  Procedure Laterality Date  . Psychologist, forensic    . Foot surgery Bilateral     removed nrves off bottomof feet."  . Pr vein bypass graft,aorto-fem-pop      fem fem  04/07/09  . Abdominal hysterectomy    . Cardiac defibrillator placement    . Eye surgery  2013    Cataract bilateral   . Axillary-femoral bypass graft Right 06/29/2013    Procedure: BYPASS GRAFT AXILLA-RIGHT FEMORAL;  Surgeon: Rosetta Posner, MD;  Location: [facility];  Service: Vascular;  Laterality: Right;  . Femoral-tibial bypass graft  2002  . Femoral-femoral bypass graft  03/2009    S/P left to right fem-fem bypass graft  . Abdominal aortagram Bilateral 06/26/2013    Procedure: ABDOMINAL AORTAGRAM;  Surgeon: Elam Dutch, MD;  Location: [facility]  CATH LAB;  Service: Cardiovascular;  Laterality: Bilateral;  . Colonoscopy    . Cleft lip repair N/A 05/10/2015    Procedure: Karapandandzic flap to lower lip ;  Surgeon: Irene Limbo, MD;  Location: [facility];  Service: Plastics;  Laterality: N/A;  . False aneurysm repair Right 12/16/2015    Procedure: Resection of Right Femoral Anastamotic Pseudoaneurysm with Dacron Patch Angioplasy Right Common Femoral Artery ;  Surgeon: Mal Misty, MD;  Location[facility] OR;  Service: Vascular;  Laterality: Right;   Social History:  reports that she has been smoking Cigarettes.  She has a 55 pack-year smoking history. She has never used smokeless tobacco. She reports that she does not drink alcohol or use illicit drugs.  Allergies  Allergen Reactions  . Morphine     Other reaction(s): Other (See Comments) hallucinations REACTION: makes me crazy  . Gabapentin Other (See Comments)    300 mg upsets her stomach and 100 mg affect her sleep  . Naproxen Other (See Comments)    Blurred vision  . Prednisone Other (See Comments)    cranky   Family History  Problem Relation Age of Onset  . Peripheral  vascular disease Sister   . AAA (abdominal aortic aneurysm) Sister   . Diabetes Mother   . Varicose Veins Mother   . Peripheral vascular disease Mother   . Hypertension Mother   . Heart attack Father     Massive Heart Attack  . CAD Father     Prior to Admission medications   Medication Sig Start Date End Date Taking? Authorizing Provider  allopurinol (ZYLOPRIM) 100 MG tablet Take 100 mg by mouth daily.  09/28/13  Yes Historical Provider, MD  aspirin EC 81 MG tablet Take 81 mg by mouth daily.   Yes Historical Provider, MD  Cholecalciferol (VITAMIN D) 1000 UNITS capsule Take 1,000 Units by mouth daily.     Yes Historical Provider, MD  clonazePAM (KLONOPIN) 1 MG tablet Take 2 mg by mouth at bedtime.    Yes Historical Provider, MD  estrogens, conjugated, (PREMARIN) 1.25 MG tablet Take 1.25 mg by mouth daily.    Yes Historical Provider, MD  ezetimibe (ZETIA) 10 MG tablet Take 10 mg by mouth daily.   Yes Historical Provider, MD  ferrous sulfate 325 (65 FE) MG tablet Take 325 mg by mouth daily with breakfast.     Yes Historical Provider, MD  furosemide (LASIX) 20 MG tablet Take 30 mg by mouth daily.   Yes Historical Provider, MD  HYDROcodone-acetaminophen (NORCO) 10-325 MG tablet Take 1 tablet by mouth every 6 (six) hours as needed (pain).  06/29/15  Yes Historical Provider, MD  ketoconazole (NIZORAL) 2 % cream Apply 1 application topically daily.  09/28/15  Yes Historical Provider, MD  KLOR-CON M20 20 MEQ tablet TAKE 1 TABLET BY MOUTH ON MONDAYS,WEDNESDAYS, AND FRIDAYS 09/30/15  Yes Evans Lance, MD  meclizine (ANTIVERT) 12.5 MG tablet Take 12.5 mg by mouth 3 (three) times daily as needed for dizziness.   Yes Historical Provider, MD  metoprolol succinate (TOPROL-XL) 50 MG 24 hr tablet Take 1 tablet (50 mg total) by mouth daily. Take with or immediately following a meal. 06/10/15  Yes Evans Lance, MD  nitroGLYCERIN (NITROSTAT) 0.4 MG SL tablet Place 0.4 mg under the tongue every 5 (five) minutes as needed for chest pain.   Yes Historical Provider, MD  pantoprazole (PROTONIX) 40 MG tablet Take 40 mg by mouth 2 (two) times daily.    Yes Historical Provider, MD  PRESCRIPTION MEDICATION Apply 1 application topically daily as needed (itching). Compounded cream:  Ketoconazole 2% cream/ fluticasone 0.05% cream 1:1   Yes Historical Provider, MD  ramipril (ALTACE) 2.5 MG capsule TAKE ONE CAPSULE BY MOUTH EVERY DAY 10/21/14  Yes Evans Lance, MD  traMADol (ULTRAM) 50 MG tablet Take 1 tablet by mouth daily as needed. Pain 09/21/15  Yes Historical Provider, MD  zolpidem (AMBIEN) 10 MG tablet Take 10 mg by mouth at bedtime.    Yes Historical Provider, MD  furosemide (LASIX) 20 MG tablet TAKE 1&1/2 TABLETS BY MOUTH DAILY Patient not taking: Reported on 12/11/2015 10/24/15   Evans Lance, MD   Physical Exam: Blood pressure  130/74, pulse 74, temperature 98.7 F (37.1 C), temperature source Axillary, resp. rate 18, height 5\' 4"  (1.626 m), weight 55.9 kg (123 lb 3.8 oz), SpO2 96 %. Filed Vitals:   12/06/15 1955 12/07/15 0449  BP: 117/73 130/74  Pulse: 72 74  Temp: 98.6 F (37 C) 98.7 F (37.1 C)  Resp: 18 18     General:  Thin frail chronically ill appearing, Teary  Eyes: Equal round reactive to light  ENT:  Ears clean nose without drainage oropharynx without erythema or exudate  Neck: Range of motion no JVD  Cardiovascular: Rate and rhythm no lower extremity edema  Respiratory: Normal effort spray shin somewhat shallow breath sounds coarse throughout moist nonproductive cough week cough effort  Abdomen: Distended sluggish bowel sounds mild tenderness epigastric area  Skin: Ecchymosis particularly right groin  Musculoskeletal: Joints without swelling/erythema poor muscle tone  Psychiatric: Flat affect somewhat weepy  Neurologic: Alert and oriented 3 speech clear but slow response to questions sluggishly. Moves All extremities  Labs on Admission:  Basic Metabolic Panel:  Recent Labs Lab 12/03/15 0430 12/04/15 0735 12/05/15 0425 12/06/15 0328 12/07/15 0330  NA 135 135 140 142 143  K 4.6 4.0 3.9 4.1 4.5  CL 107 110 116* 115* 113*  CO2 17* 17* 17* 18* 19*  GLUCOSE 82 108* 125* 111* 85  BUN 39* 27* 25* 29* 34*  CREATININE 1.71* 1.37* 1.39* 1.64* 1.93*  CALCIUM 8.4* 8.0* 8.0* 8.5* 8.6*   Liver Function Tests:  Recent Labs Lab 12/03/2015 1235  AST 12*  ALT <5*  ALKPHOS 95  BILITOT 0.3  PROT 6.7  ALBUMIN 3.4*    Recent Labs Lab 11/17/2015 1235  LIPASE 33   No results for input(s): AMMONIA in the last 168 hours. CBC:  Recent Labs Lab 12/03/15 0430 12/04/15 0735 12/05/15 0425 12/05/15 2024 12/06/15 0253 12/07/15 0330  WBC 27.6* 22.1* 20.7*  --  19.2* 19.7*  HGB 9.1* 7.6* 7.2* 8.7* 8.6* 8.7*  HCT 27.3* 22.4* 22.4* 25.4* 27.1* 27.2*  MCV 89.5 90.7 93.3  --  92.5 93.2   PLT 91* 92* 116*  --  118* 123*   Cardiac Enzymes:  Recent Labs Lab 12/01/15 1001 12/02/15 0740 12/02/15 1239  TROPONINI 0.03 0.14* 0.12*   BNP: Invalid input(s): POCBNP CBG: No results for input(s): GLUCAP in the last 168 hours.  Radiological Exams on Admission: No results found.  EKG:   Time spent: 75 minutes  [facility]   If 7PM-7AM, please contact night-coverage [email or web address] Password [facility] 12/07/2015, 9:25 AM

## 2015-12-07 NOTE — Progress Notes (Signed)
Physical Therapy Treatment Patient Details Name: [name] MRN: [id number] DOB: 11/18/1945 Today's Date: 12/07/2015    History of Present Illness Patient is a 70 y/o female with hx of COPD, DVT, CHF, HTN, stroke, depression, tobacco abuse, gout, HLD and CKD s/p Resection of Right Femoral Anastamotic Pseudoaneurysm with Dacron Patch Angioplasy Right Common Femoral Artery.     PT Comments    Pt performed decreased gait distance and remains to require mod assist for gait and functional mobility.  Pt would benefit from continued treatment in acute setting to improve transfer technique and progress gait distance.    Follow Up Recommendations  SNF;Supervision/Assistance - 24 hour     Equipment Recommendations  None recommended by PT    Recommendations for Other Services       Precautions / Restrictions Precautions Precautions: Fall Restrictions Weight Bearing Restrictions: No    Mobility  Bed Mobility               General bed mobility comments: Pt received in recliner chair.    Transfers Overall transfer level: Needs assistance Equipment used: Rolling walker (2 wheeled) Transfers: Sit to/from Stand Sit to Stand: Mod assist         General transfer comment: Pt required boosting of hips from seated surface and controlling descent to seated surface.  pt sits impulsive with no eccentric loading.  Pt required cues for hand placement and forward weight shiftting.    Ambulation/Gait Ambulation/Gait assistance: Mod assist Ambulation Distance (Feet): 56 Feet (intolerable pain and fatigue limited gait distance.  ) Assistive device: Rolling walker (2 wheeled) Gait Pattern/deviations: Step-through pattern;Narrow base of support;Decreased stride length;Staggering left;Staggering right;Trunk flexed   Gait velocity interpretation: Below normal speed for age/gender General Gait Details: Pt remains to require cues for RW placement to improve safety.  Pt with narrow BOS and  slight scissoring requiring cues to increase BOS.  Pt remains to require mod assist to maintain balance.     Stairs Stairs:  (unable due to pain and fatigue.)          Wheelchair Mobility    Modified Rankin (Stroke Patients Only)       Balance                                    Cognition Arousal/Alertness: Awake/alert Behavior During Therapy: Flat affect Overall Cognitive Status: Impaired/Different from baseline Area of Impairment: Orientation;Safety/judgement;Problem solving                    Exercises      General Comments        Pertinent Vitals/Pain Pain Assessment: Faces Pain Score: 4  Pain Location: abdomen Pain Descriptors / Indicators: Grimacing;Guarding;Moaning Pain Intervention(s): Limited activity within patient's tolerance;Monitored during session;Repositioned    Home Living                      Prior Function            PT Goals (current goals can now be found in the care plan section) Acute Rehab PT Goals Patient Stated Goal: to get stronger Potential to Achieve Goals: Fair Progress towards PT goals: Progressing toward goals    Frequency  Min 3X/week    PT Plan Current plan remains appropriate    Co-evaluation             End of Session Equipment Utilized During Treatment:  Gait belt Activity Tolerance: Patient limited by lethargy;Patient limited by fatigue;Patient limited by pain Patient left: in chair;with call bell/phone within reach;with chair alarm set     Time: [id number] PT Time Calculation (min) (ACUTE ONLY): 24 min  Charges:  $Gait Training: 8-22 mins $Therapeutic Activity: 8-22 mins                    G Codes:      Cristela Blue January 06, 2016, 10:30 AM  Governor Rooks, PTA pager [phone]

## 2015-12-07 NOTE — Progress Notes (Signed)
Pt R thigh incision draining serosanguinous drainage requiring multiple changes per day.

## 2015-12-08 ENCOUNTER — Inpatient Hospital Stay (HOSPITAL_COMMUNITY): Payer: Commercial Managed Care - HMO

## 2015-12-08 ENCOUNTER — Other Ambulatory Visit (HOSPITAL_COMMUNITY): Payer: Commercial Managed Care - HMO

## 2015-12-08 DIAGNOSIS — R1032 Left lower quadrant pain: Secondary | ICD-10-CM

## 2015-12-08 DIAGNOSIS — K551 Chronic vascular disorders of intestine: Secondary | ICD-10-CM

## 2015-12-08 DIAGNOSIS — N182 Chronic kidney disease, stage 2 (mild): Secondary | ICD-10-CM

## 2015-12-08 LAB — CBC
HCT: 29 % — ABNORMAL LOW (ref 36.0–46.0)
Hemoglobin: 9.3 g/dL — ABNORMAL LOW (ref 12.0–15.0)
MCH: 30.3 pg (ref 26.0–34.0)
MCHC: 32.1 g/dL (ref 30.0–36.0)
MCV: 94.5 fL (ref 78.0–100.0)
PLATELETS: 141 10*3/uL — AB (ref 150–400)
RBC: 3.07 MIL/uL — AB (ref 3.87–5.11)
RDW: 17.4 % — ABNORMAL HIGH (ref 11.5–15.5)
WBC: 23 10*3/uL — ABNORMAL HIGH (ref 4.0–10.5)

## 2015-12-08 LAB — BASIC METABOLIC PANEL
Anion gap: 11 (ref 5–15)
BUN: 30 mg/dL — AB (ref 6–20)
CO2: 18 mmol/L — ABNORMAL LOW (ref 22–32)
Calcium: 8.6 mg/dL — ABNORMAL LOW (ref 8.9–10.3)
Chloride: 114 mmol/L — ABNORMAL HIGH (ref 101–111)
Creatinine, Ser: 1.97 mg/dL — ABNORMAL HIGH (ref 0.44–1.00)
GFR calc Af Amer: 29 mL/min — ABNORMAL LOW (ref >60)
GFR, EST NON AFRICAN AMERICAN: 25 mL/min — AB (ref >60)
GLUCOSE: 111 mg/dL — AB (ref 65–99)
POTASSIUM: 3.6 mmol/L (ref 3.5–5.1)
Sodium: 143 mmol/L (ref 135–145)

## 2015-12-08 LAB — LEGIONELLA PNEUMOPHILA SEROGP 1 UR AG: L. pneumophila Serogp 1 Ur Ag: NEGATIVE

## 2015-12-08 LAB — BRAIN NATRIURETIC PEPTIDE: B Natriuretic Peptide: 3698.6 pg/mL — ABNORMAL HIGH (ref 0.0–100.0)

## 2015-12-08 MED ORDER — PIPERACILLIN-TAZOBACTAM IN DEX 2-0.25 GM/50ML IV SOLN
2.2500 g | Freq: Four times a day (QID) | INTRAVENOUS | Status: DC
  Administered 2015-12-08 – 2015-12-10 (×6): 2.25 g via INTRAVENOUS
  Filled 2015-12-08 (×9): qty 50

## 2015-12-08 MED ORDER — ALBUTEROL SULFATE (2.5 MG/3ML) 0.083% IN NEBU: 2.5000 mg | INHALATION_SOLUTION | RESPIRATORY_TRACT | Status: DC | PRN

## 2015-12-08 MED ORDER — PIPERACILLIN-TAZOBACTAM 3.375 G IVPB 30 MIN
3.3750 g | Freq: Once | INTRAVENOUS | Status: AC
  Administered 2015-12-08: 3.375 g via INTRAVENOUS
  Filled 2015-12-08: qty 50

## 2015-12-08 MED ORDER — FUROSEMIDE 10 MG/ML IJ SOLN
40.0000 mg | Freq: Two times a day (BID) | INTRAMUSCULAR | Status: DC
  Administered 2015-12-08: 40 mg via INTRAVENOUS
  Filled 2015-12-08: qty 4

## 2015-12-08 MED ORDER — FUROSEMIDE 10 MG/ML IJ SOLN
40.0000 mg | Freq: Every day | INTRAMUSCULAR | Status: DC
  Administered 2015-12-08: 40 mg via INTRAVENOUS
  Filled 2015-12-08: qty 4

## 2015-12-08 MED ORDER — IPRATROPIUM-ALBUTEROL 0.5-2.5 (3) MG/3ML IN SOLN
3.0000 mL | Freq: Three times a day (TID) | RESPIRATORY_TRACT | Status: DC
  Administered 2015-12-08 – 2015-12-09 (×3): 3 mL via RESPIRATORY_TRACT
  Filled 2015-12-08 (×5): qty 3

## 2015-12-08 MED ORDER — IPRATROPIUM-ALBUTEROL 0.5-2.5 (3) MG/3ML IN SOLN
3.0000 mL | RESPIRATORY_TRACT | Status: DC
  Administered 2015-12-08: 3 mL via RESPIRATORY_TRACT
  Filled 2015-12-08: qty 3

## 2015-12-08 NOTE — Consult Note (Signed)
[facility] Consult: 11:58 AM 12/08/2015  LOS: 8 days    Referring Provider: Dr Kellie Simmering  Primary Care Physician:  Shirline Frees, MD Primary Gastroenterologist:  Dr. Carlean Purl     Reason for Consultation:  Abdominal pain.    HPI: [name] is a 70 y.o. female.  pt with significant peripheral vascular disease.  S/P 2006 right femoral-popliteal bypass graft,  2002 left femoral to posterior tibial bypass graft, 2010 left-to-right femoral-femoral bypass, 06/2013 right axillary to femoral bypass .   CAD hx, stent prior to 2010, MI 10/2008 s/p 10/2008 angioplasty and DES stent placement, EF 20 to 25 % then. S/p 2010 AICD.   CVA, COPD still smoking 1ppd, depression. Stage 3 CKD.   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  04/2010 EGD.  For anemia microcytic, suspect from chronic blood loss. Normal study.  " FUTURE ENDOSCOPIC STUDIES WILL PROBABLY REQUIRE DEEP SEDATION OR  GENERAL ANESTHESIA. SHE DID NOT TOLERATE THIS WELL AND COULD NOT  COMPLETE COLONOSCOPY" "Will not pursue further work-up as we have not proven blood loss  and her comorbidities limit abilities to do so also. I.e risk-benefit ration does not favor pursuing further work-up in my  opinion. Anemia could be nutritional also. She has had iv iron and should also try to take po iron"  04/2010 Colonoscopy:      1) Normal rectum  2) Normal in the sigmoid colon to mid-sigmoid where there was angulation amd stenosis, discomfort and procedure               discontinued.  3) Incomplete exam 03/2006 EGD.  For acute dysphagia, food impaction.  Removal of piece of steak.  Combative during procedure.  01/2006 EGD.  For intermittent dysphagia.  Slightly tortuous distal esophagus, no strictures: this likely source of sxs. 01/2006 Colonoscopy.   Adenoma surveillance. Descending polyp, path: tubular adenoma. External rrhoids.     Admitted with symptomatic pseudoaneurysm at right femoral anastomosis.  S/p 11/27/2015 Resection of Right Femoral Anastamotic Pseudoaneurysm with Dacron Patch Angioplasy Right Common Femoral Artery  Seen by cardiology 3/16 for chest pain (component of chronic chest pain as well): "likely had hypoperfusion of coronary vessels during hypotensive episode".  troponins not elevated. BNP today is 3698.   After surgery she c/o abdominal pain, says it is severe, intermittent, worse on left but radiates to right.  Not increased with her limited PO intake.  BMs less frequent than at home but not bloody.   At home pt has poor appetite but not abdominal pain.  + weight loss: baseline 1 year ago ~ 135 #, 106# in 09/2015, 123# now.  Chronic narcotics for back pain.  Chronic LE edema and redness, skin breakdown and purpura.  Family also concerned by pt's lethargy and MS changes: at home she is sharp, mostly sedentary, smokes 1 to 1/5 PPD, walks with cane.  Now she is quite lethargic and slow to answer.  She has not been confused however.   12/03/15 CT abdomen, without contrast:   1. Postoperative changes in the right groin with surrounding  hematoma. The hematoma measures 3.6 by 2.9 cm on axial image 82 and 8.5 cm in cranial caudal dimension on image 24. The small amount of adjacent air in the soft tissues may all be postsurgical. 2. Wall thickening in the descending colon could be due to poor distention, as suggested by lack of pericolonic stranding in sites without ascites. However, given fever, colitis, is not completely excluded. Recommend clinical correlation. 3. Fluid overload with ascites and abdominal wall edema.  Labs reveal some AKI,  Transaminases and Lipase obtained on 3/15: not elevated.  Not rechecked since.  Hgb 9.3 (nadir 7.6), MCV in 90s.  Previous years baseline in 10 to 11 range. Received PRBCs x 3 so far, last on  3/20 FOBT negative.  Platelets low, to 77 last week.  Coags normal.  WBCs as high as 27, 23 today.   Blood gram stain: clusters of GPCs, clx pending.  Urine analysis was equivocal with yeast present, but no urine clx thus far.      Past Medical History  Diagnosis Date  . PVD (peripheral vascular disease) ([place])     status post prior femoral-popliteal bypass  . Back pain   . Arthritis   . CAD (coronary artery disease)     status post prior stenting to the proximal RCA, status post anterior STEMI 10/2008 (RCA occluded at the prior stent, LAD treated with a bare metal stent and a drug-eluting stent),  . Chronic systolic heart failure (HCC)     echo 11/2011: EF 25-30%, AS and apical AK, trivial AI, mild MR, PASP 34.  . Anemia   . Ischemic cardiomyopathy   . AICD (automatic cardioverter/defibrillator) present   . HLD (hyperlipidemia)   . GERD (gastroesophageal reflux disease)   . RLS (restless legs syndrome)   . Depression   . Tobacco abuse   . Pain in joint, lower leg   . Depressive disorder, not elsewhere classified   . Acute myocardial infarction of other anterior wall, subsequent episode of care 2010  . CHF (congestive heart failure) ([place])   . COPD (chronic obstructive pulmonary disease) ([place])   . DVT (deep venous thrombosis) ([place])   . Mixed hyperlipidemia   . Gout   . Atherosclerosis of native arteries of the extremities with intermittent claudication   . Osteoarthritis   . Eczema   . Fall at home July 5,  and  Oct.  2015  . Cancer (HCC)     Skin  of Left  Face- " upper eye"5/15  . Hypertension   . Anginal pain ([place])   . Shortness of breath dyspnea     with exertion  . Chronic kidney disease     "years ago, saw Dr. Mcarthur Rossetti"  "no one has said anything recently"  . Stroke [facility])     before Heart attack, 2010  . Neuropathy, idiopathic     peripherial    Past Surgical History  Procedure Laterality Date  . Psychologist, forensic    . Foot surgery Bilateral     removed nrves off  bottomof feet."  . Pr vein bypass graft,aorto-fem-pop      fem fem  04/07/09  . Abdominal hysterectomy    . Cardiac defibrillator placement    . Eye surgery  2013    Cataract bilateral   . Axillary-femoral bypass graft Right 06/29/2013    Procedure: BYPASS GRAFT AXILLA-RIGHT FEMORAL;  Surgeon: Rosetta Posner, MD;  Location: [facility] OR;  Service: Vascular;  Laterality: Right;  . Femoral-tibial bypass graft  2002  . Femoral-femoral bypass graft  03/2009    S/P left to right fem-fem bypass graft  . Abdominal aortagram Bilateral 06/26/2013    Procedure: ABDOMINAL AORTAGRAM;  Surgeon: Elam Dutch, MD;  Location: [facility] CATH LAB;  Service: Cardiovascular;  Laterality: Bilateral;  . Colonoscopy    . Cleft lip repair N/A 05/10/2015    Procedure: Karapandandzic flap to lower lip ;  Surgeon: Irene Limbo, MD;  Location: [facility];  Service: Plastics;  Laterality: N/A;  . False aneurysm repair Right 12/13/2015    Procedure: Resection of Right Femoral Anastamotic Pseudoaneurysm with Dacron Patch Angioplasy Right Common Femoral Artery ;  Surgeon: Mal Misty, MD;  Location: [place];  Service: Vascular;  Laterality: Right;    Prior to Admission medications   Medication Sig Start Date End Date Taking? Authorizing Provider  allopurinol (ZYLOPRIM) 100 MG tablet Take 100 mg by mouth daily.  09/28/13  Yes Historical Provider, MD  aspirin EC 81 MG tablet Take 81 mg by mouth daily.   Yes Historical Provider, MD  Cholecalciferol (VITAMIN D) 1000 UNITS capsule Take 1,000 Units by mouth daily.     Yes Historical Provider, MD  clonazePAM (KLONOPIN) 1 MG tablet Take 2 mg by mouth at bedtime.    Yes Historical Provider, MD  estrogens, conjugated, (PREMARIN) 1.25 MG tablet Take 1.25 mg by mouth daily.   Yes Historical Provider, MD  ezetimibe (ZETIA) 10 MG tablet Take 10 mg by mouth daily.   Yes Historical Provider, MD  ferrous sulfate 325 (65 FE) MG tablet Take 325 mg by mouth daily with breakfast.     Yes Historical  Provider, MD  furosemide (LASIX) 20 MG tablet Take 30 mg by mouth daily.   Yes Historical Provider, MD  HYDROcodone-acetaminophen (NORCO) 10-325 MG tablet Take 1 tablet by mouth every 6 (six) hours as needed (pain).  06/29/15  Yes Historical Provider, MD  ketoconazole (NIZORAL) 2 % cream Apply 1 application topically daily.  09/28/15  Yes Historical Provider, MD  KLOR-CON M20 20 MEQ tablet TAKE 1 TABLET BY MOUTH ON MONDAYS,WEDNESDAYS, AND FRIDAYS 09/30/15  Yes Evans Lance, MD  meclizine (ANTIVERT) 12.5 MG tablet Take 12.5 mg by mouth 3 (three) times daily as needed for dizziness.   Yes Historical Provider, MD  metoprolol succinate (TOPROL-XL) 50 MG 24 hr tablet Take 1 tablet (50 mg total) by mouth daily. Take with or immediately following a meal. 06/10/15  Yes Evans Lance, MD  nitroGLYCERIN (NITROSTAT) 0.4 MG SL tablet Place 0.4 mg under the tongue every 5 (five) minutes as needed for chest pain.   Yes Historical Provider, MD  pantoprazole (PROTONIX) 40 MG tablet Take 40 mg by mouth 2 (two) times daily.    Yes Historical Provider, MD  PRESCRIPTION MEDICATION Apply 1 application topically daily as needed (itching). Compounded cream:  Ketoconazole 2% cream/ fluticasone 0.05% cream 1:1   Yes Historical Provider, MD  ramipril (ALTACE) 2.5 MG capsule TAKE ONE CAPSULE BY MOUTH EVERY DAY 10/21/14  Yes Evans Lance, MD  traMADol (ULTRAM) 50 MG tablet Take 1 tablet by mouth daily as needed. Pain 09/21/15  Yes Historical Provider, MD  zolpidem (AMBIEN) 10 MG tablet Take 10 mg by mouth at bedtime.    Yes Historical Provider, MD  furosemide (LASIX) 20 MG tablet TAKE 1&1/2 TABLETS BY MOUTH DAILY Patient not taking: Reported on 12/03/2015 10/24/15   Evans Lance, MD    Scheduled Meds: . allopurinol  100 mg Oral Daily  .  aspirin EC  81 mg Oral Daily  . clonazePAM  1 mg Oral QHS  . docusate sodium  100 mg Oral Daily  . estrogens (conjugated)  1.25 mg Oral Daily  . ezetimibe  10 mg Oral Daily  . ferrous  sulfate  325 mg Oral Q breakfast  . furosemide  40 mg Intravenous Daily  . ipratropium-albuterol  3 mL Nebulization Q4H  . metoprolol succinate  50 mg Oral Daily  . pantoprazole  40 mg Oral BID  . piperacillin-tazobactam (ZOSYN)  IV  2.25 g Intravenous 4 times per day  . piperacillin-tazobactam  3.375 g Intravenous Once  . [START ON 12/09/2015] vancomycin  750 mg Intravenous Q48H   Infusions:   PRN Meds: acetaminophen **OR** acetaminophen, alum & mag hydroxide-simeth, guaiFENesin-dextromethorphan, hydrALAZINE, HYDROcodone-acetaminophen, magnesium hydroxide, magnesium sulfate 1 - 4 g bolus IVPB, meclizine, metoprolol, nitroGLYCERIN, ondansetron, phenol, potassium chloride   Allergies as of 12/13/2015 - Review Complete 12/01/2015  Allergen Reaction Noted  . Morphine    . Gabapentin Other (See Comments) 05/26/2014  . Naproxen Other (See Comments) 04/29/2012  . Prednisone Other (See Comments) 04/29/2012    Family History  Problem Relation Age of Onset  . Peripheral vascular disease Sister   . AAA (abdominal aortic aneurysm) Sister   . Diabetes Mother   . Varicose Veins Mother   . Peripheral vascular disease Mother   . Hypertension Mother   . Heart attack Father     Massive Heart Attack  . CAD Father     Social History   Social History  . Marital Status: Married    Spouse Name: N/A  . Number of Children: N/A  . Years of Education: N/A   Occupational History  . Not on file.   Social History Main Topics  . Smoking status: Current Every Day Smoker -- 1.00 packs/day for 55 years    Types: Cigarettes  . Smokeless tobacco: Never Used     Comment: 05/09/15- "I've smoked 1 in the past 3 days"  "Tried to smoke"  . Alcohol Use: No  . Drug Use: No  . Sexual Activity: Not on file   Other Topics Concern  . Not on file   Social History Narrative    REVIEW OF SYSTEMS: Constitutional:  Per HPI ENT:  No nose bleeds Pulm:  No resting dyspnea CV:  No palpitations, no LE edema.   GU:  No hematuria, no frequency GI:  Per HPI.  No dysphagia Heme:  Easy to bruise.  Large hematoma in right thigh and groin.    Transfusions:  As per HPI Neuro:  No headaches, no peripheral tingling or numbness Derm:  No itching, no rash or sores.  Endocrine:  No sweats or chills.  No polyuria or dysuria Immunization:  Not queried Travel:  None beyond local counties in last few months.    PHYSICAL EXAM: Vital signs in last 24 hours: Filed Vitals:   12/07/15 2005 12/08/15 0559  BP: 133/75 121/72  Pulse: 71 74  Temp: 97.9 F (36.6 C) 97.7 F (36.5 C)  Resp: 16 15   Wt Readings from Last 3 Encounters:  12/04/15 55.9 kg (123 lb 3.8 oz)  09/27/15 48.353 kg (106 lb 9.6 oz)  09/14/15 48.081 kg (106 lb)    General: frail, acutely and chronically ill looking WF.  Cachectic Head:  No asymmetry or swelling.  Temporal wasting  Eyes:  No icterus or pallor Ears:  HOH  Nose:  No discharge Mouth:  Clear, moist.  Tongue  midline Neck:  No mass, no JVD Lungs:  BS extremely poor, no rales or crackles.  Pt having trouble taking a deep breath but not dyspneic Heart: RRR.  No MRG Abdomen:  Soft, BS hypoactive, mildly distended, + anasarca at flanks.  Exquisitely tender on left abdomen, no guard or rebound. Lesser tenderness on right.  Large hematoma at right groin, radiated to vulva, to right thigh, around back to right flank.   Rectal: deferred   Extremities:  Erythema, weeping edema and purpura with erythema on both lower legs.  Pitting into thighs and lower trunk c/w anasarca.   Neurologic:  Difficulty following commands.  Seems to have cognitive disconnect.  Full limb strength.  No tremor.   Skin:  Thin, purpura Psych:  Cooperative, flat affect.   Intake/Output from previous day: 03/22 0701 - 03/23 0700 In: 120 [P.O.:120] Out: -  Intake/Output this shift: Total I/O In: 120 [P.O.:120] Out: -   LAB RESULTS:  Recent Labs  12/06/15 0253 12/07/15 0330 12/08/15 0345  WBC 19.2*  19.7* 23.0*  HGB 8.6* 8.7* 9.3*  HCT 27.1* 27.2* 29.0*  PLT 118* 123* 141*   BMET Lab Results  Component Value Date   NA 143 12/08/2015   NA 143 12/07/2015   NA 142 12/06/2015   K 3.6 12/08/2015   K 4.5 12/07/2015   K 4.1 12/06/2015   CL 114* 12/08/2015   CL 113* 12/07/2015   CL 115* 12/06/2015   CO2 18* 12/08/2015   CO2 19* 12/07/2015   CO2 18* 12/06/2015   GLUCOSE 111* 12/08/2015   GLUCOSE 85 12/07/2015   GLUCOSE 111* 12/06/2015   BUN 30* 12/08/2015   BUN 34* 12/07/2015   BUN 29* 12/06/2015   CREATININE 1.97* 12/08/2015   CREATININE 1.93* 12/07/2015   CREATININE 1.64* 12/06/2015   CALCIUM 8.6* 12/08/2015   CALCIUM 8.6* 12/07/2015   CALCIUM 8.5* 12/06/2015   LFT No results for input(s): PROT, ALBUMIN, AST, ALT, ALKPHOS, BILITOT, BILIDIR, IBILI in the last 72 hours. PT/INR Lab Results  Component Value Date   INR 1.23 12/07/2015   INR 1.17 11/17/2015   INR 1.04 03/21/2014   Hepatitis Panel No results for input(s): HEPBSAG, HCVAB, HEPAIGM, HEPBIGM in the last 72 hours. C-Diff No components found for: CDIFF Lipase     Component Value Date/Time   LIPASE 33 11/16/2015 1235    Drugs of Abuse  No results found for: LABOPIA, COCAINSCRNUR, LABBENZ, AMPHETMU, THCU, LABBARB   RADIOLOGY STUDIES: Dg Chest Port 1 View  12/07/2015  CLINICAL DATA:  Shortness of breath with productive cough EXAM: PORTABLE CHEST 1 VIEW COMPARISON:  12/02/15 chest radiograph. FINDINGS: Stable configuration of single lead left subclavian ICD with lead tip overlying the right ventricle. Stable cardiomediastinal silhouette with mild cardiomegaly. No pneumothorax. New small left pleural effusion. New mild pulmonary edema. Patchy right infrahilar opacity appears new. Patchy consolidation at the left lung base appears increased. IMPRESSION: 1. Mild congestive heart failure. 2. New small left pleural effusion. 3. New patchy right infrahilar opacity and increased patchy left lung base consolidation,  suspicious for a combination of atelectasis, pneumonia and/or aspiration. Recommend follow-up chest imaging to resolution. Electronically Signed   By: Ilona Sorrel M.D.   On: 12/07/2015 10:35    ENDOSCOPIC STUDIES: Per HPI  IMPRESSION:   *  Abdominal pain after 12/15/2015 repair of pseudoaneurysm.  Episode of hypotension early on.  She has extensive hx of ASPVD requiring multiple revaascularization procedures.  Suspect mesenteric ischemia.   Wall thickening of  colon on initial CT last week, ? Under distention vs colitis:  No diarrhea or blood pr and FOBT negative.   *  Ascites, but normal liver on CT: suspect volume overload and protein malnutrition.   *  Altered mental status.  Pt's cognition significantly changed from baseline per family, though she is oriented to place and self and situation and no asymmetric strength.  *  Blood loss anemia, due to large post op hematoma.  Wonder if the hematoma has extended into left lower abdomen or retroperitoneum and causing her abdominal pain?    PLAN:     *  Per Dr Fuller Plan.   *  prealbumin and cmet in am.    Azucena Freed  12/08/2015, 11:58 AM Pager: [phone]      Attending physician's note   I have taken a history, examined the patient and reviewed the chart. I agree with the Advanced Practitioner's note, impression and recommendations. Abdominal pain hospitalized following repair of pseudoaneurysm. 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 Repeat abd/pelvic CT to further evaluate.   Lucio Edward, MD [facility] [phone] Mon-Fri 8a-5p [phone] after 5p, weekends, holidays

## 2015-12-08 NOTE — Progress Notes (Addendum)
[facility] Progress Note  Subjective  - POD #8  Much more alert today. Is hungry. Abdominal pain improved.  Had BM yesterday. Was walking in halls with PT.  Objective Filed Vitals:   12/07/15 2005 12/08/15 0559  BP: 133/75 121/72  Pulse: 71 74  Temp: 97.9 F (36.6 C) 97.7 F (36.5 C)  Resp: 16 15    Intake/Output Summary (Last 24 hours) at 12/08/15 0923 Last data filed at 12/08/15 0041  Gross per 24 hour  Intake      0 ml  Output      0 ml  Net      0 ml   Lungs with diffuse coarse breath sounds and mild expiratory wheeze.  Abdomen soft. Right groin incision clean and intact. No drainage. Ecchymosis improving Bandage on right thigh drain site with minimal serosanguinous drainage.  Right foot warm.     Assessment/Planning: 70 y.o. female is s/p: Resection of Right Femoral Anastamotic Pseudoaneurysm with Dacron Patch Angioplasy Right Common Femoral Artery  8 Days Post-Op   Starting to improve, ambulated with PT today.  Drainage from previous JP drain site is serosanguinous. Likely residual.  ARF: Creatinine remaining at 1.9. UOP 500. Continue IVF.  Leukocytosis trending up. Remains afebrile. Previous BC's negative. Repeat BCs pending.  Abdominal pain improving: GI to see today. Had BM yesterday.  HCAP: on vanc and cefepime UTI: cefepime should cover, cultures pending  Appreciate medical service and GI following.   Alvia Grove 12/08/2015 9:23 AM --  Laboratory CBC    Component Value Date/Time   WBC 23.0* 12/08/2015 0345   HGB 9.3* 12/08/2015 0345   HCT 29.0* 12/08/2015 0345   PLT 141* 12/08/2015 0345    BMET    Component Value Date/Time   NA 143 12/08/2015 0345   K 3.6 12/08/2015 0345   CL 114* 12/08/2015 0345   CO2 18* 12/08/2015 0345   GLUCOSE 111* 12/08/2015 0345   BUN 30* 12/08/2015 0345   CREATININE 1.97* 12/08/2015 0345   CALCIUM 8.6* 12/08/2015 0345   GFRNONAA 25* 12/08/2015 0345   GFRAA 29* 12/08/2015 0345     COAG Lab Results  Component Value Date   INR 1.23 12/07/2015   INR 1.17 11/25/2015   INR 1.04 03/21/2014   No results found for: PTT  Antibiotics Anti-infectives    Start     Dose/Rate Route Frequency Ordered Stop   12/09/15 1500  vancomycin (VANCOCIN) IVPB 750 mg/150 ml premix     750 mg 150 mL/hr over 60 Minutes Intravenous Every 48 hours 12/07/15 1435     12/07/15 1500  vancomycin (VANCOCIN) IVPB 1000 mg/200 mL premix     1,000 mg 200 mL/hr over 60 Minutes Intravenous  Once 12/07/15 1435 12/07/15 1806   12/07/15 1500  ceFEPIme (MAXIPIME) 1 g in dextrose 5 % 50 mL IVPB     1 g 100 mL/hr over 30 Minutes Intravenous Every 24 hours 12/07/15 1435  1459   12/07/15 1400  ceFEPIme (MAXIPIME) 1 g in dextrose 5 % 50 mL IVPB  Status:  Discontinued     1 g 100 mL/hr over 30 Minutes Intravenous Every 24 hours 12/07/15 1339 12/07/15 1435   12/03/15 1000  piperacillin-tazobactam (ZOSYN) IVPB 3.375 g  Status:  Discontinued     3.375 g 12.5 mL/hr over 240 Minutes Intravenous Every 8 hours 12/03/15 0944 12/07/15 1339   12/01/15 0400  cefUROXime (ZINACEF) 1.5 g in dextrose 5 % 50 mL IVPB  1.5 g 100 mL/hr over 30 Minutes Intravenous Every 12 hours 12/01/15 0057 12/01/15 1742       Virgina Jock, PA-C [facility] Office: [phone] Pager: [phone] 12/08/2015 9:23 AM  Slightly more alert and oriented today Has ambulated some in the hall with help Taking small amounts of diet Right inguinal incision is dry with good femoral and popliteal pulse. There has been some serosanguineous drainage from the drain exit site but not the incision.  Appreciate internal medicine and GI input and hopefully we can DC patient home in the near future

## 2015-12-08 NOTE — Progress Notes (Addendum)
[facility] CONSULT progress note                                                                               Patient Demographics  [name], is a 70 y.o. female, DOB - 05-27-1946, [id number]  Admit date - 11/26/2015   Admitting Physician Waldemar Dickens, MD  Outpatient Primary MD for the patient is Shirline Frees, MD  LOS - 8  days    Chief Complaint  Patient presents with  . Groin Pain  . Back Pain       Brief HPI   70 year old Female with CAD, chronic systolic heart failure, anemia, ASCVD, tobacco use, CHF . Admitted by vascular with severe PAD and underwent right femoral pseudoaneurysm expansion on March15th. Since that time she's had slow progression. Her chronic kidney disease slightly worsening as well as her chronic IDA, had transfusion on 3/20. Patient complained of abdominal pain. IM service consulted for medical management, worsening of anemia, renal function, HCAP.   Assessment & Plan     Acute on chronic kidney injury. Stage II. Likely related to dehydration in the setting of recent diuretics. Baseline around 1.3  - Monitor vancomycin closely per pharmacy with renal dosing, avoid hypotension -Monitor urine output, still positive balance of 8.2 L, received IV fluids - Hold IV fluids, patient has pleural effusion with pulmonary edema  Leukocytosis. Likely due to HCAP, UTI , ? Colitis, Gram-positive cocci bacteremia/sepsis -Urine strep pneumo negative, UA positive for UTI, follow culture (was not sent, I added it on), follow blood cultures - Chest x-ray showed mild CHF with new small pleural effusion with new patchy right infrahilar opacity and increased patchy left lung base consolidation - O2 sats 95% on room air, I/O's with 8.4 L positive balance, check echo, BNP  - On cefepime, vancomycin with renal dosing per pharmacy. Will change antibiotics to Zosyn for anaerobic coverage to cover possible colitis - Patient has abdominal pain, CT  abdomen on 3/18 had shown postop changes with wall thickening in the descending colon ? Colitis vs worsening of hematoma. Will repeat CT abd. - Placed on scheduled nebs. Addendum Blood cultures positive for gram-positive cocci 1/2 Patient on vancomycin, will continue BNP 3698.6 placed on IV Lasix   history of iron deficiency anemia and anemia of chronic disease - Hemoglobin trending down since surgery, Had transfusion of packed RBC on 3/20 - H&H currently stable    Abdominal pain/constipation- Colitis per recent CT abdomen on 3/18 versus ileus -Minimize narcotics, mobilize, per husband at the bedside patient will be seen by GI today   Cardiomyopathy ischemic/chronic systolic heart failure. Chart review indicates echo 2013 with an EF of 25-30%,  - I's and O's with 8 L positive, follow BNP, repeat echocardiogram - likely needs to be on Lasix, on beta blocker, Lasix at home   Hypertension. Controlled.  -Currently stable   Deconditioning  - The patient is currently very debilitated and deconditioned, needs physical therapy and mobilization.   Family Communication: Discussed in detail with the patient, all imaging results, lab results explained to the patient and husband by the bedside   Disposition Plan:Per vascular  surgery   Time Spent in minutes  25 minutes   Medications  Scheduled Meds: . allopurinol  100 mg Oral Daily  . aspirin EC  81 mg Oral Daily  . ceFEPime (MAXIPIME) IV  1 g Intravenous Q24H  . clonazePAM  1 mg Oral QHS  . docusate sodium  100 mg Oral Daily  . estrogens (conjugated)  1.25 mg Oral Daily  . ezetimibe  10 mg Oral Daily  . ferrous sulfate  325 mg Oral Q breakfast  . metoprolol succinate  50 mg Oral Daily  . pantoprazole  40 mg Oral BID  . [START ON 12/09/2015] vancomycin  750 mg Intravenous Q48H   Continuous Infusions:  PRN Meds:.sodium chloride, acetaminophen **OR** acetaminophen, alum & mag hydroxide-simeth, guaiFENesin-dextromethorphan,  hydrALAZINE, HYDROcodone-acetaminophen, magnesium hydroxide, magnesium sulfate 1 - 4 g bolus IVPB, meclizine, metoprolol, nitroGLYCERIN, ondansetron, phenol, potassium chloride   Antibiotics   Anti-infectives    Start     Dose/Rate Route Frequency Ordered Stop   12/09/15 1500  vancomycin (VANCOCIN) IVPB 750 mg/150 ml premix     750 mg 150 mL/hr over 60 Minutes Intravenous Every 48 hours 12/07/15 1435     12/07/15 1500  vancomycin (VANCOCIN) IVPB 1000 mg/200 mL premix     1,000 mg 200 mL/hr over 60 Minutes Intravenous  Once 12/07/15 1435 12/07/15 1806   12/07/15 1500  ceFEPIme (MAXIPIME) 1 g in dextrose 5 % 50 mL IVPB     1 g 100 mL/hr over 30 Minutes Intravenous Every 24 hours 12/07/15 1435  1459   12/07/15 1400  ceFEPIme (MAXIPIME) 1 g in dextrose 5 % 50 mL IVPB  Status:  Discontinued     1 g 100 mL/hr over 30 Minutes Intravenous Every 24 hours 12/07/15 1339 12/07/15 1435   12/03/15 1000  piperacillin-tazobactam (ZOSYN) IVPB 3.375 g  Status:  Discontinued     3.375 g 12.5 mL/hr over 240 Minutes Intravenous Every 8 hours 12/03/15 0944 12/07/15 1339   12/01/15 0400  cefUROXime (ZINACEF) 1.5 g in dextrose 5 % 50 mL IVPB     1.5 g 100 mL/hr over 30 Minutes Intravenous Every 12 hours 12/01/15 0057 12/01/15 1742        Subjective:   [name] was seen and examined today. Somewhat somnolent at the time of my examination earlier this morning around 8 AM, husband at the bedside, complains of abdominal pain. Per husband, had a BM yesterday.  afebrile, no nausea or vomiting.   Objective:   Filed Vitals:   12/07/15 1025 12/07/15 1338 12/07/15 2005 12/08/15 0559  BP:  116/77 133/75 121/72  Pulse: 89 72 71 74  Temp:  97.6 F (36.4 C) 97.9 F (36.6 C) 97.7 F (36.5 C)  TempSrc:  Axillary Axillary Oral  Resp:  18 16 15   Height:      Weight:      SpO2:  99% 96% 95%    Intake/Output Summary (Last 24 hours) at 12/08/15 1014 Last data filed at 12/08/15 0041  Gross per 24  hour  Intake      0 ml  Output      0 ml  Net      0 ml     Wt Readings from Last 3 Encounters:  12/04/15 55.9 kg (123 lb 3.8 oz)  09/27/15 48.353 kg (106 lb 9.6 oz)  09/14/15 48.081 kg (106 lb)     Exam  General: Somnolent but easily arousable   HEENT:    Neck:   CVS:  S1clear, regular rate and rhythm.  Respiratory: diffuse coarse breath sounds   Abdomen: Soft,  tender diffusely with voluntary guarding. + bowel sounds  Ext: no cyanosis clubbing, right foot warm  RLE edema 1+, left lower extremity no edema, right groin dressing intact   Neuro: did not cooperate with exam   Skin: No rashes  Psych: somnolent but arousable   Data Reviewed:  I have personally reviewed following labs and imaging studies  Micro Results Recent Results (from the past 240 hour(s))  MRSA PCR Screening     Status: None   Collection Time: 12/01/15  5:10 PM  Result Value Ref Range Status   MRSA by PCR NEGATIVE NEGATIVE Final    Comment:        The GeneXpert MRSA Assay (FDA approved for NASAL specimens only), is one component of a comprehensive MRSA colonization surveillance program. It is not intended to diagnose MRSA infection nor to guide or monitor treatment for MRSA infections.   Culture, blood (Routine X 2) w Reflex to ID Panel     Status: None   Collection Time: 12/02/15  3:51 PM  Result Value Ref Range Status   Specimen Description BLOOD RIGHT ANTECUBITAL  Final   Special Requests BOTTLES DRAWN AEROBIC AND ANAEROBIC 5CC  Final   Culture NO GROWTH 5 DAYS  Final   Report Status 12/07/2015 FINAL  Final  Culture, blood (Routine X 2) w Reflex to ID Panel     Status: None   Collection Time: 12/02/15  3:55 PM  Result Value Ref Range Status   Specimen Description BLOOD RIGHT HAND  Final   Special Requests IN PEDIATRIC BOTTLE [facility]  Final   Culture NO GROWTH 5 DAYS  Final   Report Status 12/07/2015 FINAL  Final    Radiology Reports Ct Abdomen Pelvis Wo Contrast  12/03/2015   CLINICAL DATA:  Status post axillofemoral bypass with elevated white blood cell count. EXAM: CT ABDOMEN AND PELVIS WITHOUT CONTRAST TECHNIQUE: Multidetector CT imaging of the abdomen and pelvis was performed following the standard protocol without IV contrast. COMPARISON:  November 30, 2015 FINDINGS: New bilateral effusions, left greater than right. Underlying opacity is likely atelectasis. Cardiomegaly persists. No other abnormalities identified in the lung bases. No free air. There is increased attenuation in the subcutaneous fat consistent with volume overload. There is also ascites within the abdomen, particularly the left paracolic gutter, and in the pelvis. The left-sided predominance of ascites could be due to the fact the patient is tilted to the left. Patient is status post cholecystectomy. The liver, spleen, and right adrenal gland are normal. Prominence of the left adrenal gland is likely due to hyperplasia the unchanged. A nonobstructing left renal stone is again identified. Right kidney is unchanged and unremarkable. Pancreas demonstrates no abnormalities. 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 Atherosclerosis. No adenopathy. The stomach is normal. A few mildly prominent loops of proximal small-bowel are contrast filled and thin walled measuring up to 3 cm. Contrast does extend all the way to the colon. The descending colon is decompressed. The descending colon does demonstrate apparent wall thickening. No pericolonic fat stranding in regions without ascites. The more proximal and well-distended colon is normal. The appendix is not seen but there is no secondary evidence of appendicitis. The pelvis demonstrates previous hysterectomy. The bladder is partially decompressed with a Foley catheter. No adenopathy. There is hematoma surrounding the surgical site in the right groin. Patency and integrity  of the graft cannot be assessed on this study due to  lack of contrast. However, previously seen aneurysm is no longer visualized. Visualized bones are unremarkable. IMPRESSION: 1. Postoperative changes in the right groin with surrounding hematoma. The hematoma measures 3.6 by 2.9 cm on axial image 82 and 8.5 cm in cranial caudal dimension on image 24. The small amount of adjacent air in the soft tissues may all be postsurgical. 2. Wall thickening in the descending colon could be due to poor distention, as suggested by lack of pericolonic stranding in sites without ascites. However, given fever, colitis, is not completely excluded. Recommend clinical correlation. 3. Fluid overload with ascites and abdominal wall edema. Electronically Signed   By: Dorise Bullion III M.D   On: 12/03/2015 13:26   Ct Abdomen Pelvis Wo Contrast  12/12/2015  CLINICAL DATA:  Right groin pain and swelling, initial encounter EXAM: CT ABDOMEN AND PELVIS WITHOUT CONTRAST TECHNIQUE: Multidetector CT imaging of the abdomen and pelvis was performed following the standard protocol without IV contrast. COMPARISON:  07/09/2007 FINDINGS: Lung bases are free of acute infiltrate or sizable effusion. The gallbladder has been surgically removed. The liver, spleen, adrenal glands and pancreas are within normal limits. The kidneys are well visualized without renal calculi. A cortical calcification is noted in the left kidney. The abdominal aorta is dilated to 3.1 cm. There are changes consistent with a right axillobifemoral bypass graft. 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 The actual degree and patency within the aneurysmal dilatation cannot be assessed on this exam due to the lack of IV contrast. The femoral crossover graft appears within normal limits. The bladder is well distended. No pelvic mass lesion or sidewall abnormality is noted. The appendix is not well visualized. The osseous structures are within normal limits.  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 The degree of patency of the aneurysmal dilatation is uncertain as no contrast was administered for this exam. Ultrasound may be helpful for further evaluation. Vascular surgery consultation recommended. Dilatation of the abdominal aorta to 3 cm. Recommend followup by ultrasound in 3 years. This recommendation follows ACR consensus guidelines: White Paper of the ACR Incidental Findings Committee II on Vascular Findings. Nat[facility] Coll Radiol 2013; 10:789-794 Electronically Signed   By: Inez Catalina M.D.   On: 12/11/2015 19:38   Dg Chest Port 1 View  12/07/2015  CLINICAL DATA:  Shortness of breath with productive cough EXAM: PORTABLE CHEST 1 VIEW COMPARISON:  12/02/15 chest radiograph. FINDINGS: Stable configuration of single lead left subclavian ICD with lead tip overlying the right ventricle. Stable cardiomediastinal silhouette with mild cardiomegaly. No pneumothorax. New small left pleural effusion. New mild pulmonary edema. Patchy right infrahilar opacity appears new. Patchy consolidation at the left lung base appears increased. IMPRESSION: 1. Mild congestive heart failure. 2. New small left pleural effusion. 3. New patchy right infrahilar opacity and increased patchy left lung base consolidation, suspicious for a combination of atelectasis, pneumonia and/or aspiration. Recommend follow-up chest imaging to resolution. Electronically Signed   By: Ilona Sorrel M.D.   On: 12/07/2015 10:35   Dg Chest Port 1 View  12/02/2015  CLINICAL DATA:  Shortness of breath. Status post surgical repair of right femoral anastomotic pseudoaneurysm. EXAM: PORTABLE CHEST 1 VIEW COMPARISON:  12/05/2015 FINDINGS: Stable appearance of ICD/pacing device. The heart size and mediastinal contours are normal. Lung  volumes are normal without significant atelectasis identified. There is no evidence of  pulmonary edema, consolidation, pneumothorax, nodule or pleural fluid. IMPRESSION: No active disease. Electronically Signed   By: Aletta Edouard M.D.   On: 12/02/2015 08:34   Dg Chest Portable 1 View  11/17/2015  CLINICAL DATA:  Chest pain for 1 day EXAM: PORTABLE CHEST 1 VIEW COMPARISON:  Chest x-ray dated 09/15/2013. FINDINGS: Mild cardiomegaly is stable. Left chest wall pacemaker/AICD stable in position. Atherosclerotic changes again noted at the aortic arch. Overall cardiomediastinal silhouette is stable in size and configuration. Lungs are clear. No evidence of pneumonia. No pleural effusion. No pneumothorax seen. Osseous structures about the chest are unremarkable. IMPRESSION: Stable chest x-ray. Stable mild cardiomegaly. Lungs are clear and there is no evidence of acute cardiopulmonary abnormality. Electronically Signed   By: Franki Cabot M.D.   On: 11/18/2015 20:18    CBC  Recent Labs Lab 12/04/15 0735 12/05/15 0425 12/05/15 2024 12/06/15 0253 12/07/15 0330 12/08/15 0345  WBC 22.1* 20.7*  --  19.2* 19.7* 23.0*  HGB 7.6* 7.2* 8.7* 8.6* 8.7* 9.3*  HCT 22.4* 22.4* 25.4* 27.1* 27.2* 29.0*  PLT 92* 116*  --  118* 123* 141*  MCV 90.7 93.3  --  92.5 93.2 94.5  MCH 30.8 30.0  --  29.4 29.8 30.3  MCHC 33.9 32.1  --  31.7 32.0 32.1  RDW 16.3* 16.5*  --  16.8* 17.1* 17.4*    Chemistries   Recent Labs Lab 12/04/15 0735 12/05/15 0425 12/06/15 0328 12/07/15 0330 12/08/15 0345  NA 135 140 142 143 143  K 4.0 3.9 4.1 4.5 3.6  CL 110 116* 115* 113* 114*  CO2 17* 17* 18* 19* 18*  GLUCOSE 108* 125* 111* 85 111*  BUN 27* 25* 29* 34* 30*  CREATININE 1.37* 1.39* 1.64* 1.93* 1.97*  CALCIUM 8.0* 8.0* 8.5* 8.6* 8.6*   ------------------------------------------------------------------------------------------------------------------ estimated creatinine clearance is 23.3 mL/min (by C-G formula based on Cr of  1.97). ------------------------------------------------------------------------------------------------------------------ No results for input(s): HGBA1C in the last 72 hours. ------------------------------------------------------------------------------------------------------------------ No results for input(s): CHOL, HDL, LDLCALC, TRIG, CHOLHDL, LDLDIRECT in the last 72 hours. ------------------------------------------------------------------------------------------------------------------ No results for input(s): TSH, T4TOTAL, T3FREE, THYROIDAB in the last 72 hours.  Invalid input(s): FREET3 ------------------------------------------------------------------------------------------------------------------  Recent Labs  12/07/15 1229  VITAMINB12 193  FOLATE 12.2  FERRITIN 139  TIBC 258  IRON 49  RETICCTPCT 4.5*    Coagulation profile  Recent Labs Lab 12/07/15 1229  INR 1.23    No results for input(s): DDIMER in the last 72 hours.  Cardiac Enzymes  Recent Labs Lab 12/02/15 0740 12/02/15 1239  TROPONINI 0.14* 0.12*   ------------------------------------------------------------------------------------------------------------------ Invalid input(s): POCBNP  No results for input(s): GLUCAP in the last 72 hours.   RAI,RIPUDEEP M.D. [facility] 12/08/2015, 10:14 AM  Pager: [phone] Between 7am to 7pm - call Pager - [phone]  After 7pm go to [email or web address] - password [email or web address]  Call night coverage person covering after 7pm

## 2015-12-08 NOTE — Progress Notes (Signed)
Notified MD of pos BC and also FYI re BNP result.

## 2015-12-08 NOTE — Progress Notes (Signed)
Physical Therapy Treatment Patient Details Name: [name] MRN: [id number] DOB: 11/17/1945 Today's Date: 12/08/2015    History of Present Illness Patient is a 70 y/o female with hx of COPD, DVT, CHF, HTN, stroke, depression, tobacco abuse, gout, HLD and CKD s/p Resection of Right Femoral Anastamotic Pseudoaneurysm with Dacron Patch Angioplasy Right Common Femoral Artery.     PT Comments    Pt performed increased gait distance and presents with improved motivation.  Pt flexed in posture with c/o pain in R leg with movement.  Pt performed toilet transfer with increased time and peri care without assist post urination.  Pt fatigued post tx.    Follow Up Recommendations  SNF;Supervision/Assistance - 24 hour     Equipment Recommendations  None recommended by PT    Recommendations for Other Services       Precautions / Restrictions Precautions Precautions: Fall Restrictions Weight Bearing Restrictions: No    Mobility  Bed Mobility Overal bed mobility: Needs Assistance Bed Mobility: Supine to Sit     Supine to sit: Mod assist;HOB elevated     General bed mobility comments: Pt required assist to advance B LEs to edge of bed.  Pt required assist to elevate upper trunk into seated position edge of bed.  Pt required assist to scoot to edge of bed to improve foot placement.  Pt performed improved balance sitting edge of bed with correct positioning of B LEs.    Transfers Overall transfer level: Needs assistance Equipment used: Rolling walker (2 wheeled) Transfers: Sit to/from Stand Sit to Stand: Mod assist (Pt slow to ascend.  )         General transfer comment: Pt required max VCs for hand placement to and from seated surface.  Pt required assist for boosting hips into standing and extending B hips into standing.  Pt demonstrates poor hand placement during stand to sit when fatigued.  Pt remains to demonstrate poor eccentric loading.    Ambulation/Gait Ambulation/Gait  assistance: Min assist Ambulation Distance (Feet): 180 Feet Assistive device: Rolling walker (2 wheeled) Gait Pattern/deviations: Step-through pattern;Narrow base of support;Trunk flexed;Decreased stride length     General Gait Details: Pt remains to require cues for RW placement to improve safety. Pt required cues to improve upper tunk control and forward gaze.  Pt required decreased assist during ambulation.      Stairs            Wheelchair Mobility    Modified Rankin (Stroke Patients Only)       Balance     Sitting balance-Leahy Scale: Fair       Standing balance-Leahy Scale: Fair                      Cognition Arousal/Alertness: Awake/alert Behavior During Therapy: Flat affect Overall Cognitive Status: Within Functional Limits for tasks assessed Area of Impairment: Safety/judgement;Problem solving       Following Commands: Follows one step commands with increased time     Problem Solving: Slow processing;Requires verbal cues      Exercises      General Comments        Pertinent Vitals/Pain Pain Assessment: Faces Faces Pain Scale: Hurts little more Pain Location: abdomen and R leg.   Pain Descriptors / Indicators: Aching;Guarding;Grimacing;Moaning Pain Intervention(s): Monitored during session;Repositioned;Limited activity within patient's tolerance    Home Living                      Prior  Function            PT Goals (current goals can now be found in the care plan section) Acute Rehab PT Goals Patient Stated Goal: to get stronger Potential to Achieve Goals: Good Progress towards PT goals: Progressing toward goals    Frequency  Min 3X/week    PT Plan Current plan remains appropriate    Co-evaluation             End of Session Equipment Utilized During Treatment: Gait belt Activity Tolerance: Patient tolerated treatment well (with increased time.  ) Patient left: in chair;with call bell/phone within  reach;with chair alarm set     Time: [id number] PT Time Calculation (min) (ACUTE ONLY): 39 min  Charges:  $Gait Training: 23-37 mins $Therapeutic Activity: 8-22 mins                    G Codes:      Cristela Blue 09-Dec-2015, 9:35 AM  Governor Rooks, PTA pager [phone]

## 2015-12-08 NOTE — Progress Notes (Signed)
Pharmacy Antibiotic Note  [name] is a 70 y.o. female s/p resection of right femoral anastomotic pseudoaneurysm) noted with AKI and concern for PNA.  Pharmacy has been consulted for vancomcyin dosing (LOT 8 days). She is on cefepime and now to change to zosyn (to add anaerobic cover for colitis).  -WBC= 23, afebrile, SCr= 1.97 (trend up; baseline 1.1-1.4); CrCl ~ 20-25.   Plan: --Zosyn 3.375gm then 2.25gm IV q6h -Vancomycin 1000mg  IV followed by 750mg  IV q48hr (goal trough= 15-20 -Will follow renal function, cultures and clinical progress   Height: 5\' 4"  (162.6 cm) Weight: 123 lb 3.8 oz (55.9 kg) IBW/kg (Calculated) : 54.7  Temp (24hrs), Avg:97.7 F (36.5 C), Min:97.6 F (36.4 C), Max:97.9 F (36.6 C)   Recent Labs Lab 12/04/15 0735 12/05/15 0425 12/06/15 0253 12/06/15 0328 12/07/15 0330 12/08/15 0345  WBC 22.1* 20.7* 19.2*  --  19.7* 23.0*  CREATININE 1.37* 1.39*  --  1.64* 1.93* 1.97*    Estimated Creatinine Clearance: 23.3 mL/min (by C-G formula based on Cr of 1.97).    Allergies  Allergen Reactions  . Morphine     Other reaction(s): Other (See Comments) hallucinations REACTION: makes me crazy  . Gabapentin Other (See Comments)    300 mg upsets her stomach and 100 mg affect her sleep  . Naproxen Other (See Comments)    Blurred vision  . Prednisone Other (See Comments)    cranky    Antimicrobials this admission: 3/23 zosyn>> 3/22 vanc>> 3/22 cefepime>> 3/23  Dose adjustments this admission: n/a  Microbiology results: 3/22 blood x2 3/17 blood x- neg 3/16 MRSA PCR - neg  Hildred Laser, Pharm D 12/08/2015 10:55 AM

## 2015-12-08 NOTE — Care Management Important Message (Signed)
Important Message  Patient Details  Name: SABY SIMA MRN: [id number] Date of Birth: 1945-10-27   Medicare Important Message Given:  Yes    Witney Huie P Camryn Quesinberry 12/08/2015, 1:02 PM

## 2015-12-09 ENCOUNTER — Inpatient Hospital Stay (HOSPITAL_COMMUNITY): Payer: Commercial Managed Care - HMO

## 2015-12-09 DIAGNOSIS — I509 Heart failure, unspecified: Secondary | ICD-10-CM

## 2015-12-09 DIAGNOSIS — R109 Unspecified abdominal pain: Secondary | ICD-10-CM | POA: Insufficient documentation

## 2015-12-09 DIAGNOSIS — D72829 Elevated white blood cell count, unspecified: Secondary | ICD-10-CM

## 2015-12-09 DIAGNOSIS — K551 Chronic vascular disorders of intestine: Secondary | ICD-10-CM | POA: Insufficient documentation

## 2015-12-09 DIAGNOSIS — I5023 Acute on chronic systolic (congestive) heart failure: Secondary | ICD-10-CM | POA: Insufficient documentation

## 2015-12-09 DIAGNOSIS — N179 Acute kidney failure, unspecified: Secondary | ICD-10-CM

## 2015-12-09 DIAGNOSIS — R188 Other ascites: Secondary | ICD-10-CM | POA: Insufficient documentation

## 2015-12-09 DIAGNOSIS — R1084 Generalized abdominal pain: Secondary | ICD-10-CM

## 2015-12-09 DIAGNOSIS — I10 Essential (primary) hypertension: Secondary | ICD-10-CM

## 2015-12-09 LAB — COMPREHENSIVE METABOLIC PANEL
ALT: 6 U/L — ABNORMAL LOW (ref 14–54)
ANION GAP: 10 (ref 5–15)
AST: 11 U/L — ABNORMAL LOW (ref 15–41)
Albumin: 1.8 g/dL — ABNORMAL LOW (ref 3.5–5.0)
Alkaline Phosphatase: 62 U/L (ref 38–126)
BILIRUBIN TOTAL: 1.1 mg/dL (ref 0.3–1.2)
BUN: 34 mg/dL — AB (ref 6–20)
CO2: 21 mmol/L — ABNORMAL LOW (ref 22–32)
Calcium: 8.5 mg/dL — ABNORMAL LOW (ref 8.9–10.3)
Chloride: 112 mmol/L — ABNORMAL HIGH (ref 101–111)
Creatinine, Ser: 2.19 mg/dL — ABNORMAL HIGH (ref 0.44–1.00)
GFR, EST AFRICAN AMERICAN: 25 mL/min — AB (ref >60)
GFR, EST NON AFRICAN AMERICAN: 22 mL/min — AB (ref >60)
Glucose, Bld: 106 mg/dL — ABNORMAL HIGH (ref 65–99)
POTASSIUM: 3.3 mmol/L — AB (ref 3.5–5.1)
Sodium: 143 mmol/L (ref 135–145)
TOTAL PROTEIN: 4.8 g/dL — AB (ref 6.5–8.1)

## 2015-12-09 LAB — ECHOCARDIOGRAM COMPLETE
HEIGHTINCHES: 64 in
Weight: 1971.79 oz

## 2015-12-09 LAB — URINE CULTURE

## 2015-12-09 LAB — CBC
HEMATOCRIT: 29.7 % — AB (ref 36.0–46.0)
Hemoglobin: 9.3 g/dL — ABNORMAL LOW (ref 12.0–15.0)
MCH: 29.8 pg (ref 26.0–34.0)
MCHC: 31.3 g/dL (ref 30.0–36.0)
MCV: 95.2 fL (ref 78.0–100.0)
Platelets: 131 10*3/uL — ABNORMAL LOW (ref 150–400)
RBC: 3.12 MIL/uL — ABNORMAL LOW (ref 3.87–5.11)
RDW: 17.3 % — AB (ref 11.5–15.5)
WBC: 22.4 10*3/uL — ABNORMAL HIGH (ref 4.0–10.5)

## 2015-12-09 LAB — PREALBUMIN: PREALBUMIN: 6.4 mg/dL — AB (ref 18–38)

## 2015-12-09 MED ORDER — PERFLUTREN LIPID MICROSPHERE
INTRAVENOUS | Status: AC
  Administered 2015-12-09: 11:00:00
  Filled 2015-12-09: qty 10

## 2015-12-09 MED ORDER — OXYCODONE HCL 5 MG PO TABS
10.0000 mg | ORAL_TABLET | Freq: Once | ORAL | Status: AC
  Administered 2015-12-09: 10 mg via ORAL
  Filled 2015-12-09: qty 2

## 2015-12-09 MED ORDER — FUROSEMIDE 10 MG/ML IJ SOLN
40.0000 mg | Freq: Two times a day (BID) | INTRAMUSCULAR | Status: DC
  Administered 2015-12-09 – 2015-12-10 (×2): 40 mg via INTRAVENOUS
  Filled 2015-12-09 (×2): qty 4

## 2015-12-09 NOTE — NC FL2 (Signed)
[place] LEVEL OF CARE SCREENING TOOL     IDENTIFICATION  Patient Name: [name] Birthdate: [birth year] Sex: female Admission Date (Current Location): 11/17/2015  [facility] [place] Number:  Herbalist and Address:  [facility] [place], [place] [address] [place], [place] [address]      Provider Number: [id number]  Attending Physician Name and Address:  Mendel Corning, MD  Relative Name and Phone Number:       Current Level of Care: Hospital Recommended Level of Care: [place] Prior Approval Number:    Date Approved/Denied:   PASRR Number: [id number]  Discharge Plan: SNF    Current Diagnoses: Patient Active Problem List   Diagnosis Date Noted  . Abdominal pain   . Ascites   . Mesenteric vascular insufficiency, chronic ([place])   . AKI (acute kidney injury) ([place]) 12/07/2015  . Leukocytosis 12/07/2015  . HCAP (healthcare-associated pneumonia)   . Physical deconditioning   . Pseudoaneurysm of right femoral artery ([place]) 12/01/2015  . Ischemic chest pain ([place])   . Coronary artery disease due to lipid rich plaque   . Cardiomyopathy, ischemic   . False aneurysm ([place]) 12/09/2015  . Paresthesia of both feet- ankles 07/21/2014  . Discoloration of skin-Bilateral leg 01/19/2014  . Cold feet 01/19/2014  . Ischemia of toe 06/25/2013  . Pain in limb 10/06/2012  . Atherosclerosis of native arteries of the extremities with intermittent claudication 04/29/2012  . Systolic CHF, acute ([place]) 11/24/2011  . Benign positional vertigo 11/24/2011  . SOB (shortness of breath) 11/22/2011  . CKD (chronic kidney disease) 11/22/2011  . DIZZINESS 08/18/2009  . Automatic implantable cardioverter-defibrillator in situ 06/23/2009  . OTHER SPEC FORMS CHRONIC ISCHEMIC HEART DISEASE 06/01/2009  . Chronic systolic heart failure ([place]) 05/25/2009  . LEFT VENTRICULAR FUNCTION, DECREASED 05/25/2009  . AMI, ANTEROLATERAL 01/11/2009  .  CORONARY ARTERY DISEASE 01/11/2009  . CHF 01/11/2009  . URI 08/31/2008  . POSTMENOPAUSAL STATUS 01/27/2008  . SEBORRHEIC DERMATITIS 10/15/2007  . LETHARGY 10/15/2007  . SKIN LESION 09/23/2007  . INSOMNIA, PERSISTENT 07/15/2007  . WEIGHT LOSS 07/09/2007  . HEADACHE, OCCIPITAL 07/09/2007  . ABDOMINAL PAIN 07/09/2007  . Depressive disorder, not elsewhere classified 01/30/2007  . UTI 01/30/2007  . PVD 01/13/2007  . HYPERLIPIDEMIA 12/17/2006  . ANEMIA NEC 12/17/2006  . TOBACCO ABUSE 12/17/2006  . RESTLESS LEG SYNDROME 12/17/2006  . Essential hypertension 12/17/2006  . GERD 12/17/2006  . RENAL CALCULUS, LEFT 12/17/2006  . LOW BACK PAIN 12/17/2006  . OSTEOPENIA 12/17/2006    Orientation RESPIRATION BLADDER Height & Weight     Self, Place  Normal Incontinent Weight: 123 lb 3.8 oz (55.9 kg) Height:  5\' 4"  (162.6 cm)  BEHAVIORAL SYMPTOMS/MOOD NEUROLOGICAL BOWEL NUTRITION STATUS      Incontinent    AMBULATORY STATUS COMMUNICATION OF NEEDS Skin   Limited Assist Verbally Normal                       Personal Care Assistance Level of Assistance  Bathing, Dressing Bathing Assistance: Limited assistance   Dressing Assistance: Limited assistance     Functional Limitations Info             SPECIAL CARE FACTORS FREQUENCY  PT (By licensed PT), OT (By licensed OT)     PT Frequency: 5/wk OT Frequency: 5/wk            Contractures      Additional Factors Info  Code  Status, Allergies, Psychotropic Code Status Info: FULL Allergies Info: Morphine, Gabapentin, Naproxen, Prednisone Psychotropic Info: klonopin         Current Medications (12/09/2015):  This is the current hospital active medication list Current Facility-Administered Medications  Medication Dose Route Frequency Provider Last Rate Last Dose  . acetaminophen (TYLENOL) tablet 325-650 mg  325-650 mg Oral Q4H PRN Alvia Grove, PA-C   650 mg at 12/09/15 0102   Or  . acetaminophen (TYLENOL) suppository  325-650 mg  325-650 mg Rectal Q4H PRN Alvia Grove, PA-C      . albuterol (PROVENTIL) (2.5 MG/3ML) 0.083% nebulizer solution 2.5 mg  2.5 mg Nebulization Q4H PRN Mal Misty, MD      . aspirin EC tablet 81 mg  81 mg Oral Daily Alvia Grove, PA-C   81 mg at 12/09/15 1247  . clonazePAM (KLONOPIN) tablet 1 mg  1 mg Oral QHS Radene Gunning, NP   1 mg at 12/08/15 2105  . docusate sodium (COLACE) capsule 100 mg  100 mg Oral Daily Alvia Grove, PA-C   100 mg at 12/09/15 1247  . estrogens (conjugated) (PREMARIN) tablet 1.25 mg  1.25 mg Oral Daily Alvia Grove, PA-C   1.25 mg at 12/09/15 1248  . ezetimibe (ZETIA) tablet 10 mg  10 mg Oral Daily Alvia Grove, PA-C   10 mg at 12/09/15 1248  . ferrous sulfate tablet 325 mg  325 mg Oral Q breakfast Alvia Grove, PA-C   325 mg at 12/09/15 [id number]  . guaiFENesin-dextromethorphan (ROBITUSSIN DM) 100-10 MG/5ML syrup 15 mL  15 mL Oral Q4H PRN Alvia Grove, PA-C      . hydrALAZINE (APRESOLINE) injection 5 mg  5 mg Intravenous Q20 Min PRN Alvia Grove, PA-C      . HYDROcodone-acetaminophen (NORCO) 10-325 MG per tablet 1 tablet  1 tablet Oral Q6H PRN Radene Gunning, NP   1 tablet at 12/09/15 1248  . ipratropium-albuterol (DUONEB) 0.5-2.5 (3) MG/3ML nebulizer solution 3 mL  3 mL Nebulization TID Mal Misty, MD   3 mL at 12/09/15 1400  . meclizine (ANTIVERT) tablet 12.5 mg  12.5 mg Oral TID PRN Alvia Grove, PA-C      . metoprolol (LOPRESSOR) injection 2-5 mg  2-5 mg Intravenous Q2H PRN Alvia Grove, PA-C      . metoprolol succinate (TOPROL-XL) 24 hr tablet 50 mg  50 mg Oral Daily Samantha J Rhyne, PA-C   50 mg at 12/09/15 1248  . nitroGLYCERIN (NITROSTAT) SL tablet 0.4 mg  0.4 mg Sublingual Q5 min PRN Alvia Grove, PA-C   0.4 mg at 12/01/15 1117  . ondansetron (ZOFRAN) injection 4 mg  4 mg Intravenous Q6H PRN Alvia Grove, PA-C   4 mg at 12/03/15 0930  . pantoprazole (PROTONIX) EC tablet 40 mg  40 mg Oral BID Alvia Grove, PA-C   40 mg at 12/09/15 1247  . PERFLUTREN LIPID MICROSPHERE injection SUSP           . phenol (CHLORASEPTIC) mouth spray 1 spray  1 spray Mouth/Throat PRN Alvia Grove, PA-C      . piperacillin-tazobactam (ZOSYN) IVPB 2.25 g  2.25 g Intravenous 4 times per day Kris Mouton, RPH   2.25 g at 12/09/15 1248  . potassium chloride SA (K-DUR,KLOR-CON) CR tablet 20-40 mEq  20-40 mEq Oral Daily PRN Alvia Grove, PA-C      . vancomycin (VANCOCIN) IVPB 750  mg/150 ml premix  750 mg Intravenous Q48H Kris Mouton, [facility]         Discharge Medications: Please see discharge summary for a list of discharge medications.  Relevant Imaging Results:  Relevant Lab Results:   Additional Information SS#: [id number]  Cranford Mon, [place]

## 2015-12-09 NOTE — Progress Notes (Signed)
Physical Therapy Treatment Patient Details Name: [name] MRN: [id number] DOB: [birth year] Today's Date: 12/09/2015    History of Present Illness Patient is a 70 y/o female with hx of COPD, DVT, CHF, HTN, stroke, depression, tobacco abuse, gout, HLD and CKD s/p Resection of Right Femoral Anastamotic Pseudoaneurysm with Dacron Patch Angioplasy Right Common Femoral Artery.     PT Comments    Pt required max cues for encouragement to participate and advance mobility.  Pt present with lethargy and required increase time to complete transfer activities.    Follow Up Recommendations  SNF;Supervision/Assistance - 24 hour     Equipment Recommendations  None recommended by PT    Recommendations for Other Services       Precautions / Restrictions Precautions Precautions: Fall Restrictions Weight Bearing Restrictions: No    Mobility  Bed Mobility Overal bed mobility: Needs Assistance Bed Mobility: Supine to Sit     Supine to sit: Mod assist Sit to supine: Mod assist   General bed mobility comments: Required assist to elevate trunk into sitting edge of bed.  Pt required assist to lower trunk and lift LEs into bed for safety.   Pt required increased time and facilitation to perform bed mobility techniques.    Transfers Overall transfer level: Needs assistance Equipment used: Rolling walker (2 wheeled) Transfers: Sit to/from Stand Sit to Stand: Mod assist Stand pivot transfers: Mod assist       General transfer comment: assist for hand placement, to rise and steady, moves very slowly  Ambulation/Gait Ambulation/Gait assistance: Min assist Ambulation Distance (Feet): 180 Feet Assistive device: Rolling walker (2 wheeled) Gait Pattern/deviations: Step-through pattern;Narrow base of support;Trunk flexed;Decreased stride length     General Gait Details: Pt remains to require cues for RW placement to improve safety. Pt required cues to improve upper tunk control and forward  gaze.  Pt required encouragement to improve technique.    Stairs            Wheelchair Mobility    Modified Rankin (Stroke Patients Only)       Balance Overall balance assessment: Needs assistance   Sitting balance-Leahy Scale: Fair       Standing balance-Leahy Scale: Poor                      Cognition Arousal/Alertness: Awake/alert Behavior During Therapy: Flat affect Overall Cognitive Status: Within Functional Limits for tasks assessed Area of Impairment: Following commands;Memory;Problem solving;Safety/judgement       Following Commands: Follows one step commands with increased time Safety/Judgement: Decreased awareness of deficits;Decreased awareness of safety   Problem Solving: Slow processing;Requires verbal cues General Comments: confused, not able to use call button, calling out for help to use bathroom    Exercises      General Comments        Pertinent Vitals/Pain Pain Assessment: Faces Faces Pain Scale: Hurts even more Pain Location: abdomen.   Pain Descriptors / Indicators: Grimacing;Guarding;Moaning Pain Intervention(s): Monitored during session    Home Living                      Prior Function            PT Goals (current goals can now be found in the care plan section) Acute Rehab PT Goals Patient Stated Goal: to get stronger Potential to Achieve Goals: Good Progress towards PT goals: Progressing toward goals    Frequency  Min 3X/week    PT Plan Current  plan remains appropriate    Co-evaluation             End of Session Equipment Utilized During Treatment: Gait belt Activity Tolerance: Patient tolerated treatment well Patient left: with call bell/phone within reach;with chair alarm set;in bed     Time: [id number] PT Time Calculation (min) (ACUTE ONLY): 36 min  Charges:  $Gait Training: 8-22 mins $Therapeutic Activity: 8-22 mins                    G Codes:      Cristela Blue 2016/01/06,  1:43 PM  Governor Rooks, PTA pager [phone]

## 2015-12-09 NOTE — Clinical Social Work Note (Signed)
Clinical Social Work Assessment  Patient Details  Name: [name] MRN: [id number] Date of Birth: 1945-11-11  Date of referral:  12/09/15               Reason for consult:  Facility Placement                Permission sought to share information with:  Chartered certified accountant granted to share information::  Yes, Verbal Permission Granted  Name::     [employer]  Agency::  [facility]  Relationship::  spouse  Contact Information:     Housing/Transportation Living arrangements for the past 2 months:  Single Family Home Source of Information:  Spouse Patient Interpreter Needed:  None Criminal Activity/Legal Involvement Pertinent to Current Situation/Hospitalization:  No - Comment as needed Significant Relationships:  Adult Children, Spouse Lives with:  Spouse Do you feel safe going back to the place where you live?  No Need for family participation in patient care:  Yes (Comment) (currently needing help with decision making)  Care giving concerns:  Pt lives at home with spouse- currently pt needing mod assist to get up and is continuing to get weaker- pt husband does not think she will be able to return home at current level of needs   Facilities manager / plan:  CSW spoke with pt husband concerning continued recommendation by medical staff for SNF placement.  Husband and pt children had previously been refusing placement but pt has declined during admission so CSW explained need for reassessment.  Pt husband stated he understood pt would be too much to handle at home at this time but husband is now convinced pt will not leave the hospital and he anticipates a hospital death.  Employment status:  Retired Nurse, adult PT Recommendations:    Information / Referral to community resources:  [place]  Patient/Family's Response to care:  Pt husband is agreeable to SNF search if pt is able to DC from hospital to  rehab facility and acknowledges he would be unable to sufficiently care for pt at home.  Patient/Family's Understanding of and Emotional Response to Diagnosis, Current Treatment, and Prognosis:  Husband sounded hopeless over the phone and believes that the pt will die in the hospital from her current issues based on what MDs are saying- unclear if this is reasonable assumption- MD notes imply pt prognosis is poor but no implication that hospital death is anticipated at this time.  Emotional Assessment Appearance:  Appears stated age Attitude/Demeanor/Rapport:    Affect (typically observed):    Orientation:  Oriented to Self, Oriented to Place Alcohol / Substance use:  Tobacco Use Psych involvement (Current and /or in the community):  No (Comment)  Discharge Needs  Concerns to be addressed:  Care Coordination Readmission within the last 30 days:  No Current discharge risk:  Physical Impairment, Chronically ill Barriers to Discharge:  Continued Medical Work up   Cranford Mon, LCSW 12/09/2015, 2:47 PM

## 2015-12-09 NOTE — Progress Notes (Signed)
Utilization review completed.  

## 2015-12-09 NOTE — Progress Notes (Signed)
Patient ID: [name], female   DOB: 1945/12/10, 70 y.o.   MRN: [id number] Patient continues to be quite weak despite attempts at physical therapy and occupational therapy. She appears slightly dehydrated and cardiology has been consulted by the hospitalist service Right lower extremity remains unremarkable with right inguinal wound well-healed Patient does have 1 positive blood culture with coag-negative staph-uncertain if this is accurate  I had discussion with patient's daughter who is very realistic about her mother's prognosis which is not good for the long-term  Dr.Rai has agreed to take patient in transfer since problems are purely medical at this point We will continue to follow from a surgical standpoint

## 2015-12-09 NOTE — Progress Notes (Signed)
Occupational Therapy Treatment Patient Details Name: [name] MRN: [id number] DOB: [birth year] Today's Date: 12/09/2015    History of present illness Patient is a 70 y/o female with hx of COPD, DVT, CHF, HTN, stroke, depression, tobacco abuse, gout, HLD and CKD s/p Resection of Right Femoral Anastamotic Pseudoaneurysm with Dacron Patch Angioplasy Right Common Femoral Artery.    OT comments  Pt alert and oriented, but with slow mentation and poor problem solving. She required moderate assistance for toilet transfer and min to max assist for grooming. Continue to recommend a period of rehab prior to returning home.  Follow Up Recommendations  SNF;Supervision/Assistance - 24 hour    Equipment Recommendations       Recommendations for Other Services      Precautions / Restrictions Precautions Precautions: Fall       Mobility Bed Mobility Overal bed mobility: Needs Assistance Bed Mobility: Supine to Sit;Sit to Supine     Supine to sit: Mod assist;HOB elevated Sit to supine: Mod assist   General bed mobility comments: assist to raise trunk and shift hips to EOB, assist for raising LEs into bed  Transfers Overall transfer level: Needs assistance Equipment used: Rolling walker (2 wheeled)   Sit to Stand: Mod assist Stand pivot transfers: Mod assist       General transfer comment: assist for hand placement, to rise and steady, moves very slowly    Balance Overall balance assessment: Needs assistance   Sitting balance-Leahy Scale: Fair       Standing balance-Leahy Scale: Poor                     ADL Overall ADL's : Needs assistance/impaired     Grooming: Wash/dry hands;Wash/dry face;Sitting;Minimal assistance;Oral care Grooming Details (indicate cue type and reason): total assist to clean dentures                 Toilet Transfer: Moderate assistance;Stand-pivot;BSC   Toileting- Clothing Manipulation and Hygiene: Sit to/from stand;Minimal  assistance Toileting - Clothing Manipulation Details (indicate cue type and reason): performed pericare, assiste for gown        General ADL Comments: Pt remains significantly weak.      Vision                     Perception     Praxis      Cognition   Behavior During Therapy: Flat affect Overall Cognitive Status: Impaired/Different from baseline Area of Impairment: Following commands;Memory;Problem solving;Safety/judgement        Following Commands: Follows one step commands with increased time Safety/Judgement: Decreased awareness of deficits;Decreased awareness of safety   Problem Solving: Slow processing;Requires verbal cues General Comments: confused, not able to use call button, calling out for help to use bathroom    Extremity/Trunk Assessment               Exercises     Shoulder Instructions       General Comments      Pertinent Vitals/ Pain       Pain Assessment: Faces Faces Pain Scale: Hurts whole lot Pain Location: abdomen Pain Descriptors / Indicators: Grimacing;Guarding;Moaning Pain Intervention(s): Monitored during session;Patient requesting pain meds-RN notified  Home Living                                          Prior Functioning/Environment  Frequency       Progress Toward Goals  OT Goals(current goals can now be found in the care plan section)  Progress towards OT goals: Not progressing toward goals - comment (remains weak with impaired cognition)     Plan Discharge plan remains appropriate    Co-evaluation                 End of Session Equipment Utilized During Treatment: Gait belt;Rolling walker   Activity Tolerance Patient limited by pain;Patient limited by fatigue   Patient Left in bed;with call bell/phone within reach;with family/visitor present;with bed alarm set   Nurse Communication Patient requests pain meds        Time: 1131-1200 OT Time Calculation  (min): 29 min  Charges: OT General Charges $OT Visit: 1 Procedure OT Treatments $Self Care/Home Management : 23-37 mins  Malka So 12/09/2015, 12:08 PM  [phone]

## 2015-12-09 NOTE — Progress Notes (Signed)
Daily Rounding Note  12/09/2015, 8:34 AM  LOS: 9 days   SUBJECTIVE:       Pain improved.  However still tender to exam.  Family says she is most alert she has been since the surgery and she is talking more.  She says she had a BM yesterday  OBJECTIVE:         Vital signs in last 24 hours:    Temp:  [97.4 F (36.3 C)-99 F (37.2 C)] 97.5 F (36.4 C) (03/24 0701) Pulse Rate:  [60-74] 60 (03/24 0701) Resp:  [18-20] 20 (03/24 0701) BP: (112-126)/(63-74) 115/68 mmHg (03/24 0701) SpO2:  [94 %-97 %] 96 % (03/24 0701) Last BM Date: 12/08/15 Filed Weights   11/26/2015 2124 12/04/15 0600  Weight: 46.72 kg (103 lb) 55.9 kg (123 lb 3.8 oz)   General: looks ill, cachectic, lethargic   Heart: RRR Chest: clear bil.  No SOB or cough but minimal BS bil.   Abdomen: soft, mild distended, widely distributed tenderness sparing only the right LQ and greatest on left.   No guard or rebound.   Extremities: + edema and discolaration in both LE.  Anasarca.  Bruising in right thigh/flank/pubis.  Neuro/Psych:  More alert, still with blank stare and cognitive slowing.  She is appropriate.  Follows commands.    Intake/Output from previous day: 03/23 0701 - 03/24 0700 In: 120 [P.O.:120] Out: 1150 [Urine:1150]  Intake/Output this shift:    Lab Results:  Recent Labs  12/07/15 0330 12/08/15 0345 12/09/15 0350  WBC 19.7* 23.0* 22.4*  HGB 8.7* 9.3* 9.3*  HCT 27.2* 29.0* 29.7*  PLT 123* 141* 131*   BMET  Recent Labs  12/07/15 0330 12/08/15 0345 12/09/15 0350  NA 143 143 143  K 4.5 3.6 3.3*  CL 113* 114* 112*  CO2 19* 18* 21*  GLUCOSE 85 111* 106*  BUN 34* 30* 34*  CREATININE 1.93* 1.97* 2.19*  CALCIUM 8.6* 8.6* 8.5*   LFT  Recent Labs  12/09/15 0350  PROT 4.8*  ALBUMIN 1.8*  AST 11*  ALT 6*  ALKPHOS 62  BILITOT 1.1   PT/INR  Recent Labs  12/07/15 1229  LABPROT 15.7*  INR 1.23    Studies/Results: Ct Abdomen  Pelvis Wo Contrast  12/08/2015  CLINICAL DATA:  Diffuse abdominal pain for 1 day. EXAM: CT ABDOMEN AND PELVIS WITHOUT CONTRAST TECHNIQUE: Multidetector CT imaging of the abdomen and pelvis was performed following the standard protocol without IV contrast. COMPARISON:  12/03/2015. FINDINGS: Lower chest: Persistent and slightly larger bilateral pleural effusions and overlying atelectasis or infiltrates. The heart is enlarged. Stable aortic and coronary artery calcifications. Hepatobiliary: No focal hepatic lesions or intrahepatic biliary dilatation. The gallbladder is surgically absent. Pancreas: No mass, inflammation or ductal dilatation. Spleen: Normal size.  No focal lesions. Adrenals/Urinary Tract: Small left kidney is stable. Left renal calculus is unchanged. No hydroureteronephrosis. Stomach/Bowel: The stomach is displaced superiorly and laterally by a a large amount of fluid in the lesser sac. There is also increasing abdominal and pelvic ascites. The duodenum, small bowel and colon are grossly normal. No inflammatory changes, mass lesions or obstructive findings. No leaking oral contrast or free air. Vascular/Lymphatic: Stable tortuous, ectatic and calcified abdominal aorta. No mesenteric or retroperitoneal mass or adenopathy. Stable surgical changes from an right axillofemoral and fem-fem bypass graft. 5.0 x 2.3 cm hematoma noted near the right femoral graft. Other: Worsening diffuse body wall edema suggesting anasarca. The bladder is mildly  distended. It does contain air but this could be from recent catheterization. Increasing free pelvic fluid. Musculoskeletal: No significant/acute osseous findings. IMPRESSION: 1. Enlarging bilateral pleural effusions and bibasilar atelectasis/infiltrates. 2. Increasing abdominal and pelvic fluid. There is a significant amount of fluid in the lesser sac which has mass effect on the stomach. No findings for perforation. 3. Worsening diffuse body wall edema suggesting  anasarca. 4. Stable surgical changes from a right axillofemoral bypass graft and fem-fem bypass graft. Resolving hematoma in the right upper groin area. Electronically Signed   By: Marijo Sanes M.D.   On: 12/08/2015 17:18   Dg Chest Port 1 View  12/07/2015  CLINICAL DATA:  Shortness of breath with productive cough EXAM: PORTABLE CHEST 1 VIEW COMPARISON:  12/02/15 chest radiograph. FINDINGS: Stable configuration of single lead left subclavian ICD with lead tip overlying the right ventricle. Stable cardiomediastinal silhouette with mild cardiomegaly. No pneumothorax. New small left pleural effusion. New mild pulmonary edema. Patchy right infrahilar opacity appears new. Patchy consolidation at the left lung base appears increased. IMPRESSION: 1. Mild congestive heart failure. 2. New small left pleural effusion. 3. New patchy right infrahilar opacity and increased patchy left lung base consolidation, suspicious for a combination of atelectasis, pneumonia and/or aspiration. Recommend follow-up chest imaging to resolution. Electronically Signed   By: Ilona Sorrel M.D.   On: 12/07/2015 10:35   Scheduled Meds: . allopurinol  100 mg Oral Daily  . aspirin EC  81 mg Oral Daily  . clonazePAM  1 mg Oral QHS  . docusate sodium  100 mg Oral Daily  . estrogens (conjugated)  1.25 mg Oral Daily  . ezetimibe  10 mg Oral Daily  . ferrous sulfate  325 mg Oral Q breakfast  . ipratropium-albuterol  3 mL Nebulization TID  . metoprolol succinate  50 mg Oral Daily  . pantoprazole  40 mg Oral BID  . piperacillin-tazobactam (ZOSYN)  IV  2.25 g Intravenous 4 times per day  . 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 No diarrhea or blood pr and FOBT negative.  CT with gastric displacement due to large volume of fluid from omentum, increasing abd ascites.   * Ascites, but normal liver on CT.  Anasarca : likely due to CM, volume overload and protein malnutrition.  *  Protein calorie malnutrition.  Prealbumin 6.4, albumin 1.8.    * Altered mental status. Pt's cognition significantly changed from baseline per family, though she is oriented to place and self and situation and no asymmetric strength.  * Blood loss anemia, due to large post op hematoma. Wonder if the hematoma has extended into left lower abdomen or retroperitoneum and causing her abdominal pain?   *  AKI/worsening CKD.  Note Allopurinol caution in setting of renal impairment.   *  Hypokalemia.   *  Pleural effusions, Opacity.  On Vanc, Zosyn day 2.     PLAN   *  Tap ascites for diagnostic (r/o SBP) and therapeutic purposes? If so may need to give albumin post tap.   *  May need to consider alternative means to boost nutrition. ie: post pyloric tube feeds.   *  Consider initiating conservative IVF given poor po intake and AKI.  Consider  holding Allopurinol.     Azucena Freed  12/09/2015, 8:34 AM Pager: [phone]    Attending physician's note   I have taken an interval history, reviewed the chart and examined the patient. I agree with the Advanced Practitioner's note, impression and recommendations.  CT shows anasarca, enlarging pleural effusions, ascites with significant amt in lesser sac. 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 Reducing ascites will help with PO intake and abdominal pain. Frequent small meals work best for patients with chronic mesenteric insufficiency.  She is positive 8L in fluid balance with a markedly elevated BNP. Recommend  consulting cardiology today. Reserve paracentesis if diuresis is not successful.   Lucio Edward, MD [facility] [phone] Mon-Fri 8a-5p [phone] after 5p, weekends, holidays

## 2015-12-09 NOTE — Progress Notes (Signed)
  Echocardiogram 2D Echocardiogram has been performed.  Johny Chess 12/09/2015, 10:41 AM

## 2015-12-09 NOTE — Progress Notes (Addendum)
[facility] progress note                                                                               Patient Demographics  [name], is a 70 y.o. female, DOB - [birth year], [id number]  Admit date - 11/25/2015   Admitting Physician Waldemar Dickens, MD  Outpatient Primary MD for the patient is Shirline Frees, MD  LOS - 9  days    Chief Complaint  Patient presents with  . Groin Pain  . Back Pain       Brief HPI   70 year old Female with CAD, chronic systolic heart failure, anemia, ASCVD, tobacco use, CHF . Admitted by vascular with severe PAD and underwent right femoral pseudoaneurysm expansion on March15th. Since that time she's had slow progression. Her chronic kidney disease slightly worsening as well as her chronic IDA, had transfusion on 3/20. Patient complained of abdominal pain. IM service consulted for medical management, worsening of anemia, renal function, HCAP.   Assessment & Plan     Acute on chronic kidney injury. Stage II. Likely related to dehydration in the setting of recent diuretics. Baseline around 1.3  - Monitor vancomycin closely per pharmacy with renal dosing, avoid hypotension - Monitor urine output, patient had 8 L positive balance with elevated BNP, chest x-ray with small pleural effusion and CHF, hence patient was placed on Lasix 40 mg IV Q 12hrs. - Creatinine worsened to 2.1 today, hold Lasix today.    Cardiomyopathy ischemic/ acute on chronic systolic and diastolic heart failure. Chart review indicates echo 2013 with an EF of 25-30%,  - Patient had good diuresis yesterday, about 1 L output however creatinine worsened to 2.1 today, holding IV Lasix today - 2-D echo showed EF of 20-25% with grade 1 diastolic dysfunction  - called cardiology consult   Leukocytosis. Likely due to HCAP, UTI , ? Colitis, blood culture 1/2 coagulase negative staph  likely contaminant - Urine strep pneumo negative, UA positive for UTI, follow  culture (was not sent, I added it on) - Chest x-ray showed mild CHF with new small pleural effusion with new patchy right infrahilar opacity and increased patchy left lung base consolidation - Continue IV Vanc and Zosyn - Blood cultures 1/2 quietly is negative staph, likely contaminant   history of iron deficiency anemia and anemia of chronic disease - transfusion of packed RBC on 3/20 - H&H currently stable    Abdominal pain/constipation, hypoalbuminemia (1.8)- Colitis per recent CT abdomen on 3/18 versus ileus - CT abd showed enlarging pleural effusions vs infiltrates, inc abdominal and pelvic fluid, anasarca -Minimize narcotics, mobilize, appreciate gastroenterology following   Hypertension. Controlled.  -Currently stable   Deconditioning  - The patient is currently very debilitated and deconditioned, needs physical therapy and mobilization.   Family Communication: Discussed in detail with the patient, all imaging results, lab results explained to the patient and husband by the bedside. [facility] hospitalist service will take over as primary.    Disposition Plan:Per vascular surgery   Time Spent in minutes  25 minutes   Medications  Scheduled Meds: . aspirin EC  81 mg Oral Daily  .  clonazePAM  1 mg Oral QHS  . docusate sodium  100 mg Oral Daily  . estrogens (conjugated)  1.25 mg Oral Daily  . ezetimibe  10 mg Oral Daily  . ferrous sulfate  325 mg Oral Q breakfast  . ipratropium-albuterol  3 mL Nebulization TID  . metoprolol succinate  50 mg Oral Daily  . pantoprazole  40 mg Oral BID  . perflutren lipid microspheres (DEFINITY) IV suspension      . piperacillin-tazobactam (ZOSYN)  IV  2.25 g Intravenous 4 times per day  . vancomycin  750 mg Intravenous Q48H   Continuous Infusions:  PRN Meds:.acetaminophen **OR** acetaminophen, albuterol, guaiFENesin-dextromethorphan, hydrALAZINE, HYDROcodone-acetaminophen, meclizine, metoprolol, nitroGLYCERIN, ondansetron, phenol, potassium  chloride   Antibiotics   Anti-infectives    Start     Dose/Rate Route Frequency Ordered Stop   12/09/15 1500  vancomycin (VANCOCIN) IVPB 750 mg/150 ml premix     750 mg 150 mL/hr over 60 Minutes Intravenous Every 48 hours 12/07/15 1435     12/08/15 1800  piperacillin-tazobactam (ZOSYN) IVPB 2.25 g     2.25 g 100 mL/hr over 30 Minutes Intravenous 4 times per day 12/08/15 1059     12/08/15 1200  piperacillin-tazobactam (ZOSYN) IVPB 3.375 g     3.375 g 100 mL/hr over 30 Minutes Intravenous  Once 12/08/15 1059 12/08/15 1245   12/07/15 1500  vancomycin (VANCOCIN) IVPB 1000 mg/200 mL premix     1,000 mg 200 mL/hr over 60 Minutes Intravenous  Once 12/07/15 1435 12/07/15 1806   12/07/15 1500  ceFEPIme (MAXIPIME) 1 g in dextrose 5 % 50 mL IVPB  Status:  Discontinued     1 g 100 mL/hr over 30 Minutes Intravenous Every 24 hours 12/07/15 1435 12/08/15 1026   12/07/15 1400  ceFEPIme (MAXIPIME) 1 g in dextrose 5 % 50 mL IVPB  Status:  Discontinued     1 g 100 mL/hr over 30 Minutes Intravenous Every 24 hours 12/07/15 1339 12/07/15 1435   12/03/15 1000  piperacillin-tazobactam (ZOSYN) IVPB 3.375 g  Status:  Discontinued     3.375 g 12.5 mL/hr over 240 Minutes Intravenous Every 8 hours 12/03/15 0944 12/07/15 1339   12/01/15 0400  cefUROXime (ZINACEF) 1.5 g in dextrose 5 % 50 mL IVPB     1.5 g 100 mL/hr over 30 Minutes Intravenous Every 12 hours 12/01/15 0057 12/01/15 1742        Subjective:   [name] was seen and examined today. Per husband at the bedside, patient much more alert and oriented,  still has abdominal pain.  BM yesterday.  afebrile, no nausea or vomiting. Currently no chest pain or shortness of breath.  Objective:   Filed Vitals:   12/08/15 2039 12/08/15 2245 12/09/15 0701 12/09/15 1159  BP:  126/63 115/68 125/60  Pulse:  68 60 64  Temp:  97.4 F (36.3 C) 97.5 F (36.4 C) 97.5 F (36.4 C)  TempSrc:  Axillary Axillary Oral  Resp:  18 20 21   Height:      Weight:       SpO2: 94% 97% 96% 99%    Intake/Output Summary (Last 24 hours) at 12/09/15 1208 Last data filed at 12/09/15 1200  Gross per 24 hour  Intake      0 ml  Output   1350 ml  Net  -1350 ml     Wt Readings from Last 3 Encounters:  12/04/15 55.9 kg (123 lb 3.8 oz)  09/27/15 48.353 kg (106 lb 9.6 oz)  09/14/15  48.081 kg (106 lb)     Exam  General: Alert and oriented, appears ill  HEENT:    Neck:   CVS: S1clear, regular rate and rhythm.  Respiratory: diffuse coarse breath sounds   Abdomen: Soft,  mild diffuse tenderness although improving from yesterday, + bowel sounds  Ext: no cyanosis clubbing,  RLE edema 1+, left lower extremity no edema, right groin dressing intact   Neuro: able to lift up her legs, strength 5/5 upper extremities  Skin: No rashes  Psych: much more alert today   Data Reviewed:  I have personally reviewed following labs and imaging studies  Micro Results Recent Results (from the past 240 hour(s))  MRSA PCR Screening     Status: None   Collection Time: 12/01/15  5:10 PM  Result Value Ref Range Status   MRSA by PCR NEGATIVE NEGATIVE Final    Comment:        The GeneXpert MRSA Assay (FDA approved for NASAL specimens only), is one component of a comprehensive MRSA colonization surveillance program. It is not intended to diagnose MRSA infection nor to guide or monitor treatment for MRSA infections.   Culture, blood (Routine X 2) w Reflex to ID Panel     Status: None   Collection Time: 12/02/15  3:51 PM  Result Value Ref Range Status   Specimen Description BLOOD RIGHT ANTECUBITAL  Final   Special Requests BOTTLES DRAWN AEROBIC AND ANAEROBIC 5CC  Final   Culture NO GROWTH 5 DAYS  Final   Report Status 12/07/2015 FINAL  Final  Culture, blood (Routine X 2) w Reflex to ID Panel     Status: None   Collection Time: 12/02/15  3:55 PM  Result Value Ref Range Status   Specimen Description BLOOD RIGHT HAND  Final   Special Requests IN PEDIATRIC  BOTTLE 2CC  Final   Culture NO GROWTH 5 DAYS  Final   Report Status 12/07/2015 FINAL  Final  Culture, blood (routine x 2) Call MD if unable to obtain prior to antibiotics being given     Status: None (Preliminary result)   Collection Time: 12/07/15  3:00 PM  Result Value Ref Range Status   Specimen Description BLOOD LEFT HAND  Final   Special Requests IN PEDIATRIC BOTTLE 1CC  Final   Culture NO GROWTH < 24 HOURS  Final   Report Status PENDING  Incomplete  Culture, blood (routine x 2) Call MD if unable to obtain prior to antibiotics being given     Status: None (Preliminary result)   Collection Time: 12/07/15  3:10 PM  Result Value Ref Range Status   Specimen Description BLOOD RIGHT HAND  Final   Special Requests IN PEDIATRIC BOTTLE 1CC  Final   Culture  Setup Time   Final    GRAM POSITIVE COCCI IN CLUSTERS AEROBIC BOTTLE ONLY CRITICAL RESULT CALLED TO, READ BACK BY AND VERIFIED WITH: A SAMUELSON 12/08/15 @ 13 M VESTAL    Culture   Final    STAPHYLOCOCCUS SPECIES (COAGULASE NEGATIVE) THE SIGNIFICANCE OF ISOLATING THIS ORGANISM FROM A SINGLE SET OF BLOOD CULTURES WHEN MULTIPLE SETS ARE DRAWN IS UNCERTAIN. PLEASE NOTIFY THE MICROBIOLOGY DEPARTMENT WITHIN ONE WEEK IF SPECIATION AND SENSITIVITIES ARE REQUIRED.    Report Status PENDING  Incomplete    Radiology Reports Ct Abdomen Pelvis Wo Contrast  12/08/2015  CLINICAL DATA:  Diffuse abdominal pain for 1 day. EXAM: CT ABDOMEN AND PELVIS WITHOUT CONTRAST TECHNIQUE: Multidetector CT imaging of the abdomen and pelvis was performed  following the standard protocol without IV contrast. COMPARISON:  12/03/2015. FINDINGS: Lower chest: Persistent and slightly larger bilateral pleural effusions and overlying atelectasis or infiltrates. The heart is enlarged. Stable aortic and coronary artery calcifications. Hepatobiliary: No focal hepatic lesions or intrahepatic biliary dilatation. The gallbladder is surgically absent. Pancreas: No mass, inflammation  or ductal dilatation. Spleen: Normal size.  No focal lesions. Adrenals/Urinary Tract: Small left kidney is stable. Left renal calculus is unchanged. No hydroureteronephrosis. Stomach/Bowel: The stomach is displaced superiorly and laterally by a a large amount of fluid in the lesser sac. There is also increasing abdominal and pelvic ascites. The duodenum, small bowel and colon are grossly normal. No inflammatory changes, mass lesions or obstructive findings. No leaking oral contrast or free air. Vascular/Lymphatic: Stable tortuous, ectatic and calcified abdominal aorta. No mesenteric or retroperitoneal mass or adenopathy. Stable surgical changes from an right axillofemoral and fem-fem bypass graft. 5.0 x 2.3 cm hematoma noted near the right femoral graft. Other: Worsening diffuse body wall edema suggesting anasarca. The bladder is mildly distended. It does contain air but this could be from recent catheterization. Increasing free pelvic fluid. Musculoskeletal: No significant/acute osseous findings. IMPRESSION: 1. Enlarging bilateral pleural effusions and bibasilar atelectasis/infiltrates. 2. Increasing abdominal and pelvic fluid. There is a significant amount of fluid in the lesser sac which has mass effect on the stomach. No findings for perforation. 3. Worsening diffuse body wall edema suggesting anasarca. 4. Stable surgical changes from a right axillofemoral bypass graft and fem-fem bypass graft. Resolving hematoma in the right upper groin area. Electronically Signed   By: Marijo Sanes M.D.   On: 12/08/2015 17:18   Ct Abdomen Pelvis Wo Contrast  12/03/2015  CLINICAL DATA:  Status post axillofemoral bypass with elevated white blood cell count. EXAM: CT ABDOMEN AND PELVIS WITHOUT CONTRAST TECHNIQUE: Multidetector CT imaging of the abdomen and pelvis was performed following the standard protocol without IV contrast. COMPARISON:  November 30, 2015 FINDINGS: New bilateral effusions, left greater than right. Underlying  opacity is likely atelectasis. Cardiomegaly persists. No other abnormalities identified in the lung bases. No free air. There is increased attenuation in the subcutaneous fat consistent with volume overload. There is also ascites within the abdomen, particularly the left paracolic gutter, and in the pelvis. The left-sided predominance of ascites could be due to the fact the patient is tilted to the left. Patient is status post cholecystectomy. The liver, spleen, and right adrenal gland are normal. Prominence of the left adrenal gland is likely due to hyperplasia the unchanged. A nonobstructing left renal stone is again identified. Right kidney is unchanged and unremarkable. Pancreas demonstrates no abnormalities. 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 Atherosclerosis. No adenopathy. The stomach is normal. A few mildly prominent loops of proximal small-bowel are contrast filled and thin walled measuring up to 3 cm. Contrast does extend all the way to the colon. The descending colon is decompressed. The descending colon does demonstrate apparent wall thickening. No pericolonic fat stranding in regions without ascites. The more proximal and well-distended colon is normal. The appendix is not seen but there is no secondary evidence of appendicitis. The pelvis demonstrates previous hysterectomy. The bladder is partially decompressed with a Foley catheter. No adenopathy. There is hematoma surrounding the surgical site in the right groin. Patency and integrity of the graft cannot be assessed on this study due to lack of contrast. However, previously seen aneurysm is no longer visualized. Visualized bones are unremarkable. IMPRESSION:  1. Postoperative changes in the right groin with surrounding hematoma. The hematoma measures 3.6 by 2.9 cm on axial image 82 and 8.5 cm in cranial caudal dimension on image 24. The small amount of adjacent air in the soft tissues may all  be postsurgical. 2. Wall thickening in the descending colon could be due to poor distention, as suggested by lack of pericolonic stranding in sites without ascites. However, given fever, colitis, is not completely excluded. Recommend clinical correlation. 3. Fluid overload with ascites and abdominal wall edema. Electronically Signed   By: Dorise Bullion III M.D   On: 12/03/2015 13:26   Ct Abdomen Pelvis Wo Contrast  12/16/2015  CLINICAL DATA:  Right groin pain and swelling, initial encounter EXAM: CT ABDOMEN AND PELVIS WITHOUT CONTRAST TECHNIQUE: Multidetector CT imaging of the abdomen and pelvis was performed following the standard protocol without IV contrast. COMPARISON:  07/09/2007 FINDINGS: Lung bases are free of acute infiltrate or sizable effusion. The gallbladder has been surgically removed. The liver, spleen, adrenal glands and pancreas are within normal limits. The kidneys are well visualized without renal calculi. A cortical calcification is noted in the left kidney. The abdominal aorta is dilated to 3.1 cm. There are changes consistent with a right axillobifemoral bypass graft. 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 The actual degree and patency within the aneurysmal dilatation cannot be assessed on this exam due to the lack of IV contrast. The femoral crossover graft appears within normal limits. The bladder is well distended. No pelvic mass lesion or sidewall abnormality is noted. The appendix is not well visualized. The osseous structures are within normal limits. 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 The degree of patency of the aneurysmal dilatation is uncertain as no contrast was administered for this exam. Ultrasound may be helpful for further evaluation. Vascular surgery consultation recommended. Dilatation of the  abdominal aorta to 3 cm. Recommend followup by ultrasound in 3 years. This recommendation follows ACR consensus guidelines: White Paper of the ACR Incidental Findings Committee II on Vascular Findings. [facility] Coll Radiol 2013; 10:789-794 Electronically Signed   By: Inez Catalina M.D.   On: 12/16/2015 19:38   Dg Chest Port 1 View  12/07/2015  CLINICAL DATA:  Shortness of breath with productive cough EXAM: PORTABLE CHEST 1 VIEW COMPARISON:  12/02/15 chest radiograph. FINDINGS: Stable configuration of single lead left subclavian ICD with lead tip overlying the right ventricle. Stable cardiomediastinal silhouette with mild cardiomegaly. No pneumothorax. New small left pleural effusion. New mild pulmonary edema. Patchy right infrahilar opacity appears new. Patchy consolidation at the left lung base appears increased. IMPRESSION: 1. Mild congestive heart failure. 2. New small left pleural effusion. 3. New patchy right infrahilar opacity and increased patchy left lung base consolidation, suspicious for a combination of atelectasis, pneumonia and/or aspiration. Recommend follow-up chest imaging to resolution. Electronically Signed   By: Ilona Sorrel M.D.   On: 12/07/2015 10:35   Dg Chest Port 1 View  12/02/2015  CLINICAL DATA:  Shortness of breath. Status post surgical repair of right femoral anastomotic pseudoaneurysm. EXAM: PORTABLE CHEST 1 VIEW COMPARISON:  12/09/2015 FINDINGS: Stable appearance of ICD/pacing device. The heart size and mediastinal contours are normal. Lung volumes are normal without significant atelectasis identified. There is no evidence of pulmonary edema, consolidation, pneumothorax, nodule or pleural fluid. IMPRESSION: No active disease. Electronically Signed  By: Aletta Edouard M.D.   On: 12/02/2015 08:34   Dg Chest Portable 1 View  11/16/2015  CLINICAL DATA:  Chest pain for 1 day EXAM: PORTABLE CHEST 1 VIEW COMPARISON:  Chest x-ray dated 09/15/2013. FINDINGS: Mild cardiomegaly is stable.  Left chest wall pacemaker/AICD stable in position. Atherosclerotic changes again noted at the aortic arch. Overall cardiomediastinal silhouette is stable in size and configuration. Lungs are clear. No evidence of pneumonia. No pleural effusion. No pneumothorax seen. Osseous structures about the chest are unremarkable. IMPRESSION: Stable chest x-ray. Stable mild cardiomegaly. Lungs are clear and there is no evidence of acute cardiopulmonary abnormality. Electronically Signed   By: Franki Cabot M.D.   On: 12/06/2015 20:18    CBC  Recent Labs Lab 12/05/15 0425 12/05/15 2024 12/06/15 0253 12/07/15 0330 12/08/15 0345 12/09/15 0350  WBC 20.7*  --  19.2* 19.7* 23.0* 22.4*  HGB 7.2* 8.7* 8.6* 8.7* 9.3* 9.3*  HCT 22.4* 25.4* 27.1* 27.2* 29.0* 29.7*  PLT 116*  --  118* 123* 141* 131*  MCV 93.3  --  92.5 93.2 94.5 95.2  MCH 30.0  --  29.4 29.8 30.3 29.8  MCHC 32.1  --  31.7 32.0 32.1 31.3  RDW 16.5*  --  16.8* 17.1* 17.4* 17.3*    Chemistries   Recent Labs Lab 12/05/15 0425 12/06/15 0328 12/07/15 0330 12/08/15 0345 12/09/15 0350  NA 140 142 143 143 143  K 3.9 4.1 4.5 3.6 3.3*  CL 116* 115* 113* 114* 112*  CO2 17* 18* 19* 18* 21*  GLUCOSE 125* 111* 85 111* 106*  BUN 25* 29* 34* 30* 34*  CREATININE 1.39* 1.64* 1.93* 1.97* 2.19*  CALCIUM 8.0* 8.5* 8.6* 8.6* 8.5*  AST  --   --   --   --  11*  ALT  --   --   --   --  6*  ALKPHOS  --   --   --   --  62  BILITOT  --   --   --   --  1.1   ------------------------------------------------------------------------------------------------------------------ estimated creatinine clearance is 20.9 mL/min (by C-G formula based on Cr of 2.19). ------------------------------------------------------------------------------------------------------------------ No results for input(s): HGBA1C in the last 72 hours. ------------------------------------------------------------------------------------------------------------------ No results for  input(s): CHOL, HDL, LDLCALC, TRIG, CHOLHDL, LDLDIRECT in the last 72 hours. ------------------------------------------------------------------------------------------------------------------ No results for input(s): TSH, T4TOTAL, T3FREE, THYROIDAB in the last 72 hours.  Invalid input(s): FREET3 ------------------------------------------------------------------------------------------------------------------  Recent Labs  12/07/15 1229  VITAMINB12 193  FOLATE 12.2  FERRITIN 139  TIBC 258  IRON 49  RETICCTPCT 4.5*    Coagulation profile  Recent Labs Lab 12/07/15 1229  INR 1.23    No results for input(s): DDIMER in the last 72 hours.  Cardiac Enzymes  Recent Labs Lab 12/02/15 1239  TROPONINI 0.12*   ------------------------------------------------------------------------------------------------------------------ Invalid input(s): POCBNP  No results for input(s): GLUCAP in the last 72 hours.   RAI,RIPUDEEP M.D. [facility] 12/09/2015, 12:08 PM  Pager: [phone] Between 7am to 7pm - call Pager - [phone]  After 7pm go to [email or web address] - password [email or web address]  Call night coverage person covering after 7pm

## 2015-12-09 NOTE — Progress Notes (Addendum)
Progress Note    12/09/2015 8:24 AM 9 Days Post-Op  Subjective:  No specific complaints.  Husband states she feels the best today and has talked more today than her whole hospitalization.  Tm 89 now afebrile [id number] systolic HR [id number] NSR 96% RA  Abx: Vanc Zosyn  Filed Vitals:   12/08/15 2245 12/09/15 0701  BP: 126/63 115/68  Pulse: 68 60  Temp: 97.4 F (36.3 C) 97.5 F (36.4 C)  Resp: 18 20    Physical Exam: Incisions:  Right groin is clean and dry; ecchymosis over pubis, abdomen and right leg Extremities:  + palpable right PT pulse; +palpable fem fem graft pulse. Abdomen:  Mild tenderness to palpation.    CBC    Component Value Date/Time   WBC 22.4* 12/09/2015 0350   RBC 3.12* 12/09/2015 0350   RBC 3.37* 12/07/2015 1229   HGB 9.3* 12/09/2015 0350   HCT 29.7* 12/09/2015 0350   PLT 131* 12/09/2015 0350   MCV 95.2 12/09/2015 0350   MCH 29.8 12/09/2015 0350   MCHC 31.3 12/09/2015 0350   RDW 17.3* 12/09/2015 0350   LYMPHSABS 1.9 05/09/2015 0941   MONOABS 0.7 05/09/2015 0941   EOSABS 0.1 05/09/2015 0941   BASOSABS 0.1 05/09/2015 0941    BMET    Component Value Date/Time   NA 143 12/09/2015 0350   K 3.3* 12/09/2015 0350   CL 112* 12/09/2015 0350   CO2 21* 12/09/2015 0350   GLUCOSE 106* 12/09/2015 0350   BUN 34* 12/09/2015 0350   CREATININE 2.19* 12/09/2015 0350   CALCIUM 8.5* 12/09/2015 0350   GFRNONAA 22* 12/09/2015 0350   GFRAA 25* 12/09/2015 0350    INR    Component Value Date/Time   INR 1.23 12/07/2015 1229     Intake/Output Summary (Last 24 hours) at 12/09/15 0824 Last data filed at 12/09/15 0109  Gross per 24 hour  Intake    120 ml  Output   1150 ml  Net  -1030 ml   CT SCAN ABDOMEN/PELVIS 12/08/15: IMPRESSION: 1. Enlarging bilateral pleural effusions and bibasilar atelectasis/infiltrates. 2. Increasing abdominal and pelvic fluid. There is a significant amount of fluid in the lesser sac which has mass effect on  the stomach. No findings for perforation. 3. Worsening diffuse body wall edema suggesting anasarca. 4. Stable surgical changes from a right axillofemoral bypass graft and fem-fem bypass graft. Resolving hematoma in the right upper groin area.  Assessment:  70 y.o. female is s/p:  Resection of Right Femoral Anastamotic Pseudoaneurysm with Dacron Patch Angioplasy Right Common Femoral Artery   9 Days Post-Op  Plan: -pt with patent bypass graft -abdominal tenderness better today  -CT yesterday with resolving hematoma in the right upper groin area -one out of two blood cx positive for GPC in clusters.  Abx changed to Vanc and Zosyn per IM; 2D echo pending. -WBC still elevated, but slightly improved from yesterday -increased BNP and enlarging bilateral pleural effusions-was given a gentle dose of IV lasix yesterday-BS improved today.  Per IM, if creatinine improved over the weekend, may restart po lasix.    Leontine Locket, PA-C [facility] [phone] 12/09/2015 8:24 AM    Patient remains stable from vascular standpoint with nice healing of right inguinal wound and well-perfused right foot Patient remains quite weak and possibly dehydrated CT of abdomen revealed some ascites Patient continues to complain of some mild abdominal discomfort although this has improved  At this point patient's problems are entirely medical and I feel she will  likely require a stay in a skilled nursing facility for short visit We'll get case management to explore this and talk with husband

## 2015-12-09 NOTE — Progress Notes (Signed)
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  Now transferred to the Hospitalist service for protracted post-surgical care.    SUBJECTIVE:  [name] reports severe back pain.  She denies chest pain or shortness of breath.    PHYSICAL EXAM Filed Vitals:   12/08/15 2039 12/08/15 2245 12/09/15 0701 12/09/15 1159  BP:  126/63 115/68 125/60  Pulse:  68 60 64  Temp:  97.4 F (36.3 C) 97.5 F (36.4 C) 97.5 F (36.4 C)  TempSrc:  Axillary Axillary Oral  Resp:  18 20 21   Height:      Weight:      SpO2: 94% 97% 96% 99%   General:  Chronically ill-appearing, frail, elderly woman in significant distress due to pain.  Neck: JVP to mid neck at 45 degrees.  Lungs:  CTAB on anterior exam.  Unable to sit up for posterior exam due to pain. Heart:  Mostly regular with occasional ectopy.  No m/r/g.  Abdomen:  Soft, NT, ND.  +BS Extremities:  WWP.  No edema.   LABS: Lab Results  Component Value Date   TROPONINI 0.12* 12/02/2015   Results for orders placed or performed during the hospital encounter of 12/12/2015 (from the past 24 hour(s))  CBC     Status: Abnormal   Collection Time: 12/09/15  3:50 AM  Result Value Ref Range   WBC 22.4 (H) 4.0 - 10.5 K/uL   RBC 3.12 (L) 3.87 - 5.11 MIL/uL   Hemoglobin 9.3 (L) 12.0 - 15.0 g/dL   HCT 29.7 (L) 36.0 - 46.0 %   MCV 95.2 78.0 - 100.0 fL   MCH 29.8 26.0 - 34.0 pg   MCHC 31.3 30.0 - 36.0 g/dL   RDW 17.3 (H) 11.5 - 15.5 %   Platelets 131 (L) 150 - 400 K/uL  Comprehensive metabolic panel     Status: Abnormal   Collection Time: 12/09/15  3:50 AM  Result Value Ref Range   Sodium 143 135 - 145 mmol/L   Potassium 3.3 (L) 3.5 - 5.1 mmol/L   Chloride 112 (H) 101 - 111  mmol/L   CO2 21 (L) 22 - 32 mmol/L   Glucose, Bld 106 (H) 65 - 99 mg/dL   BUN 34 (H) 6 - 20 mg/dL   Creatinine, Ser 2.19 (H) 0.44 - 1.00 mg/dL   Calcium 8.5 (L) 8.9 - 10.3 mg/dL   Total Protein 4.8 (L) 6.5 - 8.1 g/dL   Albumin 1.8 (L) 3.5 - 5.0 g/dL   AST 11 (L) 15 - 41 U/L   ALT 6 (L) 14 - 54 U/L   Alkaline Phosphatase 62 38 - 126 U/L   Total Bilirubin 1.1 0.3 - 1.2 mg/dL   GFR calc non Af Amer 22 (L) >60 mL/min   GFR calc Af Amer 25 (L) >60 mL/min   Anion gap 10 5 - 15  Prealbumin     Status: Abnormal   Collection Time: 12/09/15  3:50 AM  Result Value Ref Range   Prealbumin 6.4 (L) 18 - 38 mg/dL    Intake/Output Summary (Last 24 hours) at 12/09/15 1454 Last data filed at 12/09/15 1200  Gross per 24 hour  Intake      0 ml  Output  1350 ml  Net  -1350 ml    Telemetry:   ASSESSMENT AND PLAN:  Principal Problem:   AKI (acute kidney injury) ([place]) Active Problems:   ANEMIA NEC   Essential hypertension   CKD (chronic kidney disease)   False aneurysm (HCC)   Pseudoaneurysm of right femoral artery (HCC)   Ischemic chest pain (HCC)   Coronary artery disease due to lipid rich plaque   Cardiomyopathy, ischemic   Leukocytosis   HCAP (healthcare-associated pneumonia)   Physical deconditioning   Abdominal pain   Ascites   Mesenteric vascular insufficiency, chronic (HCC)   # Acute on chronic systolic and diastolic heart failure:  # Acute on chronic renal failure:  [name] received IV lasix for diuresis yesterday with net negative 1L.  However, today her renal function has worsened.  BNP was elevated to 3700 yesterday.  Echo is unchanged from 11/23/11.  Given here elevated JVD and BNP, would continue with diuresis another day to see if her renal function will improve with decongestion of the renal veins.  If renal function continues to worsen, will consider inotropes.  Continue metoprolol.  Her albumin is 1.8 which is certainly working against her ability to keep fluid within  the intravascular space.  She likely has a mixed picture of heart failure and sepsis.   # Acute on chronic renal failure:  Blood pressure is well-controlled.  She cannot be on an ACE-I/ARB due to acute renal failure.  # CAD: Continue aspirin and metoprolol.  She is not on a statin.     Deavin Forst C. Oval Linsey, MD, [facility] 12/09/2015 2:54 PM

## 2015-12-10 ENCOUNTER — Inpatient Hospital Stay (HOSPITAL_COMMUNITY): Payer: Commercial Managed Care - HMO

## 2015-12-10 DIAGNOSIS — I110 Hypertensive heart disease with heart failure: Secondary | ICD-10-CM

## 2015-12-10 DIAGNOSIS — N189 Chronic kidney disease, unspecified: Secondary | ICD-10-CM

## 2015-12-10 LAB — CARBOXYHEMOGLOBIN
Carboxyhemoglobin: 1.9 % — ABNORMAL HIGH (ref 0.5–1.5)
Methemoglobin: 0.8 % (ref 0.0–1.5)
O2 Saturation: 55.8 %
TOTAL HEMOGLOBIN: 9 g/dL — AB (ref 12.0–16.0)

## 2015-12-10 LAB — CBC
HEMATOCRIT: 28.6 % — AB (ref 36.0–46.0)
HEMOGLOBIN: 9.4 g/dL — AB (ref 12.0–15.0)
MCH: 31 pg (ref 26.0–34.0)
MCHC: 32.9 g/dL (ref 30.0–36.0)
MCV: 94.4 fL (ref 78.0–100.0)
PLATELETS: 123 10*3/uL — AB (ref 150–400)
RBC: 3.03 MIL/uL — ABNORMAL LOW (ref 3.87–5.11)
RDW: 17.5 % — AB (ref 11.5–15.5)
WBC: 22.5 10*3/uL — AB (ref 4.0–10.5)

## 2015-12-10 LAB — MRSA PCR SCREENING: MRSA BY PCR: NEGATIVE

## 2015-12-10 LAB — CULTURE, BLOOD (ROUTINE X 2)

## 2015-12-10 LAB — BASIC METABOLIC PANEL
ANION GAP: 11 (ref 5–15)
BUN: 32 mg/dL — ABNORMAL HIGH (ref 6–20)
CO2: 21 mmol/L — ABNORMAL LOW (ref 22–32)
Calcium: 8.4 mg/dL — ABNORMAL LOW (ref 8.9–10.3)
Chloride: 110 mmol/L (ref 101–111)
Creatinine, Ser: 2.24 mg/dL — ABNORMAL HIGH (ref 0.44–1.00)
GFR, EST AFRICAN AMERICAN: 25 mL/min — AB (ref >60)
GFR, EST NON AFRICAN AMERICAN: 21 mL/min — AB (ref >60)
Glucose, Bld: 93 mg/dL (ref 65–99)
POTASSIUM: 3 mmol/L — AB (ref 3.5–5.1)
SODIUM: 142 mmol/L (ref 135–145)

## 2015-12-10 LAB — BRAIN NATRIURETIC PEPTIDE: B NATRIURETIC PEPTIDE 5: 2104.9 pg/mL — AB (ref 0.0–100.0)

## 2015-12-10 MED ORDER — IPRATROPIUM-ALBUTEROL 0.5-2.5 (3) MG/3ML IN SOLN: 3.0000 mL | RESPIRATORY_TRACT | Status: DC | PRN

## 2015-12-10 MED ORDER — POTASSIUM CHLORIDE CRYS ER 20 MEQ PO TBCR
40.0000 meq | EXTENDED_RELEASE_TABLET | ORAL | Status: AC
  Administered 2015-12-10 (×2): 40 meq via ORAL
  Filled 2015-12-10: qty 2

## 2015-12-10 MED ORDER — FUROSEMIDE 10 MG/ML IJ SOLN
80.0000 mg | Freq: Two times a day (BID) | INTRAMUSCULAR | Status: DC
  Administered 2015-12-10 – 2015-12-11 (×3): 80 mg via INTRAVENOUS
  Filled 2015-12-10 (×3): qty 8

## 2015-12-10 MED ORDER — MAGNESIUM SULFATE IN D5W 10-5 MG/ML-% IV SOLN
1.0000 g | Freq: Once | INTRAVENOUS | Status: AC
  Administered 2015-12-10: 1 g via INTRAVENOUS
  Filled 2015-12-10: qty 100

## 2015-12-10 MED ORDER — MORPHINE SULFATE (PF) 2 MG/ML IV SOLN: 2.0000 mg | INTRAVENOUS | Status: DC | PRN

## 2015-12-10 MED ORDER — HYDROMORPHONE HCL 1 MG/ML IJ SOLN
1.0000 mg | Freq: Four times a day (QID) | INTRAMUSCULAR | Status: DC | PRN
  Administered 2015-12-10 – 2015-12-11 (×5): 1 mg via INTRAVENOUS
  Filled 2015-12-10 (×5): qty 1

## 2015-12-10 MED ORDER — SODIUM CHLORIDE 0.9% FLUSH
10.0000 mL | Freq: Two times a day (BID) | INTRAVENOUS | Status: DC
  Administered 2015-12-10: 20 mL
  Administered 2015-12-11 (×2): 10 mL

## 2015-12-10 MED ORDER — POLYETHYLENE GLYCOL 3350 17 G PO PACK
17.0000 g | PACK | Freq: Two times a day (BID) | ORAL | Status: DC
  Administered 2015-12-10: 17 g via ORAL
  Filled 2015-12-10 (×4): qty 1

## 2015-12-10 MED ORDER — DOCUSATE SODIUM 100 MG PO CAPS
200.0000 mg | ORAL_CAPSULE | Freq: Two times a day (BID) | ORAL | Status: DC
  Administered 2015-12-10 (×2): 200 mg via ORAL
  Filled 2015-12-10 (×3): qty 2

## 2015-12-10 MED ORDER — ENSURE PO LIQD
237.0000 mL | Freq: Three times a day (TID) | ORAL | Status: DC
  Administered 2015-12-11 (×2): 237 mL via ORAL
  Filled 2015-12-10 (×11): qty 250

## 2015-12-10 MED ORDER — PRO-STAT SUGAR FREE PO LIQD
30.0000 mL | Freq: Three times a day (TID) | ORAL | Status: DC
  Administered 2015-12-11: 30 mL via ORAL
  Filled 2015-12-10 (×3): qty 30

## 2015-12-10 MED ORDER — SODIUM CHLORIDE 0.9% FLUSH: 10.0000 mL | INTRAVENOUS | Status: DC | PRN

## 2015-12-10 MED ORDER — MILRINONE IN DEXTROSE 20 MG/100ML IV SOLN
0.2500 ug/kg/min | INTRAVENOUS | Status: DC
  Administered 2015-12-10 – 2015-12-12 (×3): 0.25 ug/kg/min via INTRAVENOUS
  Filled 2015-12-10 (×3): qty 100

## 2015-12-10 NOTE — Progress Notes (Signed)
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  Now transferred to the Hospitalist service for protracted post-surgical care.    SUBJECTIVE:  [name] denies chest pain or shortness of breath. Her main complaint is back and leg pain.   PHYSICAL EXAM Filed Vitals:   12/09/15 1159 12/09/15 1914 12/09/15 2054 12/10/15 0624  BP: 125/60  140/76 110/60  Pulse: 64  77 60  Temp: 97.5 F (36.4 C)  97.4 F (36.3 C) 97.3 F (36.3 C)  TempSrc: Oral  Oral Oral  Resp: 21  18 18   Height:      Weight:      SpO2: 99% 93% 98% 98%   General:  Chronically ill-appearing, frail, elderly woman in significant distress due to pain.  Neck: JVP 2cm above clavicle at 45 degrees.  Lungs:  Diminished breath sounds with crackles at bilateral bases. Heart:  Mostly regular with occasional ectopy.  No m/r/g.  Abdomen:  Soft, NT, ND.  +BS Extremities:  Cool.  Multiple ecchymoses.  R upper leg dressing oozing serosanguineous fluid. No edema.   LABS: Lab Results  Component Value Date   TROPONINI 0.12* 12/02/2015   Results for orders placed or performed during the hospital encounter of 11/27/2015 (from the past 24 hour(s))  Basic metabolic panel     Status: Abnormal   Collection Time: 12/10/15  3:16 AM  Result Value Ref Range   Sodium 142 135 - 145 mmol/L   Potassium 3.0 (L) 3.5 - 5.1 mmol/L   Chloride 110 101 - 111 mmol/L   CO2 21 (L) 22 - 32 mmol/L   Glucose, Bld 93 65 - 99 mg/dL   BUN 32 (H) 6 - 20 mg/dL   Creatinine, Ser 2.24 (H) 0.44 - 1.00 mg/dL   Calcium 8.4 (L) 8.9 - 10.3 mg/dL   GFR calc non Af Amer 21 (L) >60 mL/min   GFR calc Af Amer 25 (L) >60 mL/min   Anion gap 11 5 - 15  CBC     Status: Abnormal   Collection  Time: 12/10/15  3:16 AM  Result Value Ref Range   WBC 22.5 (H) 4.0 - 10.5 K/uL   RBC 3.03 (L) 3.87 - 5.11 MIL/uL   Hemoglobin 9.4 (L) 12.0 - 15.0 g/dL   HCT 28.6 (L) 36.0 - 46.0 %   MCV 94.4 78.0 - 100.0 fL   MCH 31.0 26.0 - 34.0 pg   MCHC 32.9 30.0 - 36.0 g/dL   RDW 17.5 (H) 11.5 - 15.5 %   Platelets 123 (L) 150 - 400 K/uL    Intake/Output Summary (Last 24 hours) at 12/10/15 1011 Last data filed at 12/09/15 2055  Gross per 24 hour  Intake    480 ml  Output    200 ml  Net    280 ml    Telemetry: Sinus rhythm. Occasional PVCs. 6 beat run of NSVT.  ASSESSMENT AND PLAN:  Principal Problem:   AKI (acute kidney injury) ([place]) Active Problems:   ANEMIA NEC   Essential hypertension   CKD (chronic kidney disease)   False aneurysm (HCC)   Pseudoaneurysm of right femoral artery (HCC)   Ischemic chest pain (HCC)   Coronary artery disease due to lipid rich plaque  Cardiomyopathy, ischemic   Leukocytosis   HCAP (healthcare-associated pneumonia)   Physical deconditioning   Abdominal pain   Ascites   Mesenteric vascular insufficiency, chronic (HCC)   Acute on chronic systolic heart failure ([place])   # Acute on chronic systolic and diastolic heart failure:  # Acute on chronic renal failure:  LVEF 20-25%, which is essentially unchanged from 11/23/11.Ms. Nuber renal function continues to worsen with diuresis.  I suspect that she has cardiorenal syndrome. We will transfer her to step down in place a PICC line. We will also begin checking central venous saturation with plans to start inotropes if low. We will hold on any additional diuresis until that time.  Continue metoprolol.  # Acute on chronic renal failure:  Blood pressure is well-controlled.  She cannot be on an ACE-I/ARB due to acute renal failure.  Holding diuresis for now as above.   # CAD: Continue aspirin and metoprolol.  She is not on a statin.     Devesh Monforte C. Oval Linsey, MD, [facility] 12/10/2015 10:11 AM

## 2015-12-10 NOTE — Progress Notes (Addendum)
[facility] progress note                                                                               Patient Demographics  [name], is a 70 y.o. female, DOB - Oct 01, 1945, [id number]  Admit date - 11/22/2015   Admitting Physician Waldemar Dickens, MD  Outpatient Primary MD for the patient is Shirline Frees, MD  LOS - 10  days    Chief Complaint  Patient presents with  . Groin Pain  . Back Pain       Brief HPI   70 year old Female with CAD, chronic systolic heart failure, anemia, ASCVD, tobacco use, CHF . Admitted by vascular with severe PAD and underwent right femoral pseudoaneurysm expansion on March15th. Since that time she's had slow progression. Her chronic kidney disease slightly worsening as well as her chronic IDA, had transfusion on 3/20. Patient complained of abdominal pain. IM service consulted for medical management, worsening of anemia, renal function, HCAP.     Subjective:   Patient in bed, husband bedside, denies any headache, no fever or chills, no chest pain or cough, no shortness of breath or abdominal pain. No focal weakness.   Assessment & Plan    Acute on chronic kidney injury.  Baseline around 1.3 She was initially thought to be in fluid overload and placed on Lasix, however renal function has worsened and Lasix was held on 12/09/2015, creatinine still climbing, will involve renal. Will check renal ultrasound as well.  Cardiomyopathy ischemic/ acute on chronic systolic and diastolic heart failure. Chart review indicates echo 2013 with an EF of 25-30% - he clinically now appears compensated to me, cardiology is following, she is not short of breath has no oxygen requirement, minimal third spacing of fluid, currently on beta blocker continue.    Leukocytosis. Blood culture 1/2 coagulase negative staph  likely contaminant - Initially treated for pneumonia, also had evidence of UTI, blood cultures 1 out of 2 coag-negative staph  which is contamination. She is afebrile. No cough or shortness of breath, no oxygen demand. I do not think clinically she has pneumonia. She has been treated with vancomycin and Zosyn for at least 4 days, we'll stop all antibiotics and monitor clinically. Encourage the patient to sit up in the chair, use incentive spirometer and flutter valve both of which have been added.   history of iron deficiency anemia and anemia of chronic disease - transfusion of packed RBC on 3/20,  H&H currently stable    Abdominal pain/constipation, hypoalbuminemia (1.8)- Colitis per recent CT abdomen on 3/18 versus ileus. GI following. Per GI patient has evidence of chronic bowel ischemia, no acute etiologies, abdominal exam is benign, no diarrhea, will monitor. Bowel regimen for constipation. Does have some anasarca and third spacing of fluid. We will add protein supplementation and monitor.   Hypertension. Controlled on beta blocker.    Deconditioning  - The patient is currently very debilitated and deconditioned, needs physical therapy and mobilization.   Recent right femoral artery aneurysm surgery management per vascular surgery which was the primary team. Case transferred to hospitalist service on 12/09/2015. Vascular surgery will continue  to follow.   Hypokalemia. Replaced will monitor.  Incidental 9 mm left upper pole renal calculus. Outpt follow.    Family Communication: Discussed with husband in detail on 12/10/2015.    Disposition Plan:  Likely SNF on Monday or Tuesday   Time Spent in minutes  25 minutes   Medications  Scheduled Meds: . aspirin EC  81 mg Oral Daily  . clonazePAM  1 mg Oral QHS  . docusate sodium  100 mg Oral Daily  . ENSURE  237 mL Oral TID WC  . estrogens (conjugated)  1.25 mg Oral Daily  . ezetimibe  10 mg Oral Daily  . ferrous sulfate  325 mg Oral Q breakfast  . metoprolol succinate  50 mg Oral Daily  . pantoprazole  40 mg Oral BID  . [COMPLETED] potassium chloride   40 mEq Oral Q4H   Continuous Infusions:  PRN Meds:.acetaminophen **OR** [DISCONTINUED] acetaminophen, albuterol, guaiFENesin-dextromethorphan, hydrALAZINE, HYDROcodone-acetaminophen, ipratropium-albuterol, meclizine, metoprolol, nitroGLYCERIN, ondansetron, phenol   Antibiotics   Anti-infectives    Start     Dose/Rate Route Frequency Ordered Stop   12/09/15 1500  vancomycin (VANCOCIN) IVPB 750 mg/150 ml premix  Status:  Discontinued     750 mg 150 mL/hr over 60 Minutes Intravenous Every 48 hours 12/07/15 1435 12/10/15 1027   12/08/15 1800  piperacillin-tazobactam (ZOSYN) IVPB 2.25 g  Status:  Discontinued     2.25 g 100 mL/hr over 30 Minutes Intravenous 4 times per day 12/08/15 1059 12/10/15 1027   12/08/15 1200  piperacillin-tazobactam (ZOSYN) IVPB 3.375 g     3.375 g 100 mL/hr over 30 Minutes Intravenous  Once 12/08/15 1059 12/08/15 1245   12/07/15 1500  vancomycin (VANCOCIN) IVPB 1000 mg/200 mL premix     1,000 mg 200 mL/hr over 60 Minutes Intravenous  Once 12/07/15 1435 12/07/15 1806   12/07/15 1500  ceFEPIme (MAXIPIME) 1 g in dextrose 5 % 50 mL IVPB  Status:  Discontinued     1 g 100 mL/hr over 30 Minutes Intravenous Every 24 hours 12/07/15 1435 12/08/15 1026   12/07/15 1400  ceFEPIme (MAXIPIME) 1 g in dextrose 5 % 50 mL IVPB  Status:  Discontinued     1 g 100 mL/hr over 30 Minutes Intravenous Every 24 hours 12/07/15 1339 12/07/15 1435   12/03/15 1000  piperacillin-tazobactam (ZOSYN) IVPB 3.375 g  Status:  Discontinued     3.375 g 12.5 mL/hr over 240 Minutes Intravenous Every 8 hours 12/03/15 0944 12/07/15 1339   12/01/15 0400  cefUROXime (ZINACEF) 1.5 g in dextrose 5 % 50 mL IVPB     1.5 g 100 mL/hr over 30 Minutes Intravenous Every 12 hours 12/01/15 0057 12/01/15 1742        Objective:   Filed Vitals:   12/09/15 1159 12/09/15 1914 12/09/15 2054 12/10/15 0624  BP: 125/60  140/76 110/60  Pulse: 64  77 60  Temp: 97.5 F (36.4 C)  97.4 F (36.3 C) 97.3 F (36.3 C)   TempSrc: Oral  Oral Oral  Resp: 21  18 18   Height:      Weight:      SpO2: 99% 93% 98% 98%    Intake/Output Summary (Last 24 hours) at 12/10/15 1028 Last data filed at 12/10/15 0710  Gross per 24 hour  Intake    480 ml  Output    725 ml  Net   -245 ml     Wt Readings from Last 3 Encounters:  12/04/15 55.9 kg (123 lb 3.8 oz)  09/27/15 48.353 kg (106 lb 9.6 oz)  09/14/15 48.081 kg (106 lb)     Exam  General: Alert and oriented, appears ill  HEENT:    Neck:   CVS: S1clear, regular rate and rhythm.  Respiratory: diffuse coarse breath sounds   Abdomen: Soft,  mild diffuse tenderness although improving from yesterday, + bowel sounds  Ext: no cyanosis clubbing,  RLE edema 1+, left lower extremity no edema, right groin dressing intact   Neuro: able to lift up her legs, strength 5/5 upper extremities  Skin: No rashes  Psych: much more alert today   Data Reviewed:  I have personally reviewed following labs and imaging studies  Micro Results Recent Results (from the past 240 hour(s))  MRSA PCR Screening     Status: None   Collection Time: 12/01/15  5:10 PM  Result Value Ref Range Status   MRSA by PCR NEGATIVE NEGATIVE Final    Comment:        The GeneXpert MRSA Assay (FDA approved for NASAL specimens only), is one component of a comprehensive MRSA colonization surveillance program. It is not intended to diagnose MRSA infection nor to guide or monitor treatment for MRSA infections.   Culture, blood (Routine X 2) w Reflex to ID Panel     Status: None   Collection Time: 12/02/15  3:51 PM  Result Value Ref Range Status   Specimen Description BLOOD RIGHT ANTECUBITAL  Final   Special Requests BOTTLES DRAWN AEROBIC AND ANAEROBIC 5CC  Final   Culture NO GROWTH 5 DAYS  Final   Report Status 12/07/2015 FINAL  Final  Culture, blood (Routine X 2) w Reflex to ID Panel     Status: None   Collection Time: 12/02/15  3:55 PM  Result Value Ref Range Status   Specimen  Description BLOOD RIGHT HAND  Final   Special Requests IN PEDIATRIC BOTTLE 2CC  Final   Culture NO GROWTH 5 DAYS  Final   Report Status 12/07/2015 FINAL  Final  Urine culture     Status: None   Collection Time: 12/07/15  2:06 PM  Result Value Ref Range Status   Specimen Description URINE, RANDOM  Final   Special Requests ADDED [id number]  Final   Culture >=100,000 COLONIES/mL YEAST  Final   Report Status 12/09/2015 FINAL  Final  Culture, blood (routine x 2) Call MD if unable to obtain prior to antibiotics being given     Status: None (Preliminary result)   Collection Time: 12/07/15  3:00 PM  Result Value Ref Range Status   Specimen Description BLOOD LEFT HAND  Final   Special Requests IN PEDIATRIC BOTTLE 1CC  Final   Culture NO GROWTH 3 DAYS  Final   Report Status PENDING  Incomplete  Culture, blood (routine x 2) Call MD if unable to obtain prior to antibiotics being given     Status: None   Collection Time: 12/07/15  3:10 PM  Result Value Ref Range Status   Specimen Description BLOOD RIGHT HAND  Final   Special Requests IN PEDIATRIC BOTTLE 1CC  Final   Culture  Setup Time   Final    GRAM POSITIVE COCCI IN CLUSTERS AEROBIC BOTTLE ONLY CRITICAL RESULT CALLED TO, READ BACK BY AND VERIFIED WITH: A SAMUELSON 12/08/15 @ 53 M VESTAL    Culture   Final    STAPHYLOCOCCUS SPECIES (COAGULASE NEGATIVE) THE SIGNIFICANCE OF ISOLATING THIS ORGANISM FROM A SINGLE SET OF BLOOD CULTURES WHEN MULTIPLE SETS ARE DRAWN IS UNCERTAIN.  PLEASE NOTIFY THE MICROBIOLOGY DEPARTMENT WITHIN ONE WEEK IF SPECIATION AND SENSITIVITIES ARE REQUIRED.    Report Status 12/10/2015 FINAL  Final    Radiology Reports Ct Abdomen Pelvis Wo Contrast  12/08/2015  CLINICAL DATA:  Diffuse abdominal pain for 1 day. EXAM: CT ABDOMEN AND PELVIS WITHOUT CONTRAST TECHNIQUE: Multidetector CT imaging of the abdomen and pelvis was performed following the standard protocol without IV contrast. COMPARISON:  12/03/2015. FINDINGS: Lower  chest: Persistent and slightly larger bilateral pleural effusions and overlying atelectasis or infiltrates. The heart is enlarged. Stable aortic and coronary artery calcifications. Hepatobiliary: No focal hepatic lesions or intrahepatic biliary dilatation. The gallbladder is surgically absent. Pancreas: No mass, inflammation or ductal dilatation. Spleen: Normal size.  No focal lesions. Adrenals/Urinary Tract: Small left kidney is stable. Left renal calculus is unchanged. No hydroureteronephrosis. Stomach/Bowel: The stomach is displaced superiorly and laterally by a a large amount of fluid in the lesser sac. There is also increasing abdominal and pelvic ascites. The duodenum, small bowel and colon are grossly normal. No inflammatory changes, mass lesions or obstructive findings. No leaking oral contrast or free air. Vascular/Lymphatic: Stable tortuous, ectatic and calcified abdominal aorta. No mesenteric or retroperitoneal mass or adenopathy. Stable surgical changes from an right axillofemoral and fem-fem bypass graft. 5.0 x 2.3 cm hematoma noted near the right femoral graft. Other: Worsening diffuse body wall edema suggesting anasarca. The bladder is mildly distended. It does contain air but this could be from recent catheterization. Increasing free pelvic fluid. Musculoskeletal: No significant/acute osseous findings. IMPRESSION: 1. Enlarging bilateral pleural effusions and bibasilar atelectasis/infiltrates. 2. Increasing abdominal and pelvic fluid. There is a significant amount of fluid in the lesser sac which has mass effect on the stomach. No findings for perforation. 3. Worsening diffuse body wall edema suggesting anasarca. 4. Stable surgical changes from a right axillofemoral bypass graft and fem-fem bypass graft. Resolving hematoma in the right upper groin area. Electronically Signed   By: Marijo Sanes M.D.   On: 12/08/2015 17:18   Ct Abdomen Pelvis Wo Contrast  12/03/2015  CLINICAL DATA:  Status post  axillofemoral bypass with elevated white blood cell count. EXAM: CT ABDOMEN AND PELVIS WITHOUT CONTRAST TECHNIQUE: Multidetector CT imaging of the abdomen and pelvis was performed following the standard protocol without IV contrast. COMPARISON:  November 30, 2015 FINDINGS: New bilateral effusions, left greater than right. Underlying opacity is likely atelectasis. Cardiomegaly persists. No other abnormalities identified in the lung bases. No free air. There is increased attenuation in the subcutaneous fat consistent with volume overload. There is also ascites within the abdomen, particularly the left paracolic gutter, and in the pelvis. The left-sided predominance of ascites could be due to the fact the patient is tilted to the left. Patient is status post cholecystectomy. The liver, spleen, and right adrenal gland are normal. Prominence of the left adrenal gland is likely due to hyperplasia the unchanged. A nonobstructing left renal stone is again identified. Right kidney is unchanged and unremarkable. Pancreas demonstrates no abnormalities. 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 Atherosclerosis. No adenopathy. The stomach is normal. A few mildly prominent loops of proximal small-bowel are contrast filled and thin walled measuring up to 3 cm. Contrast does extend all the way to the colon. The descending colon is decompressed. The descending colon does demonstrate apparent wall thickening. No pericolonic fat stranding in regions without ascites. The more proximal and well-distended colon is normal. The appendix is not seen but there  is no secondary evidence of appendicitis. The pelvis demonstrates previous hysterectomy. The bladder is partially decompressed with a Foley catheter. No adenopathy. There is hematoma surrounding the surgical site in the right groin. Patency and integrity of the graft cannot be assessed on this study due to lack of contrast. However,  previously seen aneurysm is no longer visualized. Visualized bones are unremarkable. IMPRESSION: 1. Postoperative changes in the right groin with surrounding hematoma. The hematoma measures 3.6 by 2.9 cm on axial image 82 and 8.5 cm in cranial caudal dimension on image 24. The small amount of adjacent air in the soft tissues may all be postsurgical. 2. Wall thickening in the descending colon could be due to poor distention, as suggested by lack of pericolonic stranding in sites without ascites. However, given fever, colitis, is not completely excluded. Recommend clinical correlation. 3. Fluid overload with ascites and abdominal wall edema. Electronically Signed   By: Dorise Bullion III M.D   On: 12/03/2015 13:26   Ct Abdomen Pelvis Wo Contrast  12/03/2015  CLINICAL DATA:  Right groin pain and swelling, initial encounter EXAM: CT ABDOMEN AND PELVIS WITHOUT CONTRAST TECHNIQUE: Multidetector CT imaging of the abdomen and pelvis was performed following the standard protocol without IV contrast. COMPARISON:  07/09/2007 FINDINGS: Lung bases are free of acute infiltrate or sizable effusion. The gallbladder has been surgically removed. The liver, spleen, adrenal glands and pancreas are within normal limits. The kidneys are well visualized without renal calculi. A cortical calcification is noted in the left kidney. The abdominal aorta is dilated to 3.1 cm. There are changes consistent with a right axillobifemoral bypass graft. 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 The actual degree and patency within the aneurysmal dilatation cannot be assessed on this exam due to the lack of IV contrast. The femoral crossover graft appears within normal limits. The bladder is well distended. No pelvic mass lesion or sidewall abnormality is noted. The appendix is not well visualized. The osseous structures are within normal limits. IMPRESSION: Changes consistent  with aneurysmal dilatation at the touchdown site of the axillo-bifemoral bypass graft in the right groin. The degree of patency of the aneurysmal dilatation is uncertain as no contrast was administered for this exam. Ultrasound may be helpful for further evaluation. Vascular surgery consultation recommended. Dilatation of the abdominal aorta to 3 cm. Recommend followup by ultrasound in 3 years. This recommendation follows ACR consensus guidelines: White Paper of the ACR Incidental Findings Committee II on Vascular Findings. Nat[facility] Coll Radiol 2013; 10:789-794 Electronically Signed   By: Inez Catalina M.D.   On: 11/26/2015 19:38   Dg Chest Port 1 View  12/10/2015  CLINICAL DATA:  Short of breath EXAM: PORTABLE CHEST 1 VIEW COMPARISON:  12/07/2015. FINDINGS: LEFT-sided pacer with single continuously overlies enlarged cardiac silhouette. There is chronic pulmonary arteries not changed. LEFT basilar atelectasis similar prior. Mild central venous congestion. IMPRESSION: 1. Mild central venous congestion which is mildly worsened. 2. Stable LEFT basilar atelectasis versus infiltrate Electronically Signed   By: Suzy Bouchard M.D.   On: 12/10/2015 07:35   Dg Chest Port 1 View  12/07/2015  CLINICAL DATA:  Shortness of breath with productive cough EXAM: PORTABLE CHEST 1 VIEW COMPARISON:  12/02/15 chest radiograph. FINDINGS: Stable configuration of single lead left subclavian ICD with lead tip overlying the right ventricle. Stable cardiomediastinal silhouette with mild cardiomegaly. No pneumothorax. New small left pleural effusion. New mild pulmonary edema.  Patchy right infrahilar opacity appears new. Patchy consolidation at the left lung base appears increased. IMPRESSION: 1. Mild congestive heart failure. 2. New small left pleural effusion. 3. New patchy right infrahilar opacity and increased patchy left lung base consolidation, suspicious for a combination of atelectasis, pneumonia and/or aspiration. Recommend  follow-up chest imaging to resolution. Electronically Signed   By: Ilona Sorrel M.D.   On: 12/07/2015 10:35   Dg Chest Port 1 View  12/02/2015  CLINICAL DATA:  Shortness of breath. Status post surgical repair of right femoral anastomotic pseudoaneurysm. EXAM: PORTABLE CHEST 1 VIEW COMPARISON:  11/28/2015 FINDINGS: Stable appearance of ICD/pacing device. The heart size and mediastinal contours are normal. Lung volumes are normal without significant atelectasis identified. There is no evidence of pulmonary edema, consolidation, pneumothorax, nodule or pleural fluid. IMPRESSION: No active disease. Electronically Signed   By: Aletta Edouard M.D.   On: 12/02/2015 08:34   Dg Chest Portable 1 View  11/17/2015  CLINICAL DATA:  Chest pain for 1 day EXAM: PORTABLE CHEST 1 VIEW COMPARISON:  Chest x-ray dated 09/15/2013. FINDINGS: Mild cardiomegaly is stable. Left chest wall pacemaker/AICD stable in position. Atherosclerotic changes again noted at the aortic arch. Overall cardiomediastinal silhouette is stable in size and configuration. Lungs are clear. No evidence of pneumonia. No pleural effusion. No pneumothorax seen. Osseous structures about the chest are unremarkable. IMPRESSION: Stable chest x-ray. Stable mild cardiomegaly. Lungs are clear and there is no evidence of acute cardiopulmonary abnormality. Electronically Signed   By: Franki Cabot M.D.   On: 11/21/2015 20:18    CBC  Recent Labs Lab 12/06/15 0253 12/07/15 0330 12/08/15 0345 12/09/15 0350 12/10/15 0316  WBC 19.2* 19.7* 23.0* 22.4* 22.5*  HGB 8.6* 8.7* 9.3* 9.3* 9.4*  HCT 27.1* 27.2* 29.0* 29.7* 28.6*  PLT 118* 123* 141* 131* 123*  MCV 92.5 93.2 94.5 95.2 94.4  MCH 29.4 29.8 30.3 29.8 31.0  MCHC 31.7 32.0 32.1 31.3 32.9  RDW 16.8* 17.1* 17.4* 17.3* 17.5*    Chemistries   Recent Labs Lab 12/06/15 0328 12/07/15 0330 12/08/15 0345 12/09/15 0350 12/10/15 0316  NA 142 143 143 143 142  K 4.1 4.5 3.6 3.3* 3.0*  CL 115* 113* 114*  112* 110  CO2 18* 19* 18* 21* 21*  GLUCOSE 111* 85 111* 106* 93  BUN 29* 34* 30* 34* 32*  CREATININE 1.64* 1.93* 1.97* 2.19* 2.24*  CALCIUM 8.5* 8.6* 8.6* 8.5* 8.4*  AST  --   --   --  11*  --   ALT  --   --   --  6*  --   ALKPHOS  --   --   --  62  --   BILITOT  --   --   --  1.1  --    ------------------------------------------------------------------------------------------------------------------ estimated creatinine clearance is 20.5 mL/min (by C-G formula based on Cr of 2.24). ------------------------------------------------------------------------------------------------------------------ No results for input(s): HGBA1C in the last 72 hours. ------------------------------------------------------------------------------------------------------------------ No results for input(s): CHOL, HDL, LDLCALC, TRIG, CHOLHDL, LDLDIRECT in the last 72 hours. ------------------------------------------------------------------------------------------------------------------ No results for input(s): TSH, T4TOTAL, T3FREE, THYROIDAB in the last 72 hours.  Invalid input(s): FREET3 ------------------------------------------------------------------------------------------------------------------  Recent Labs  12/07/15 1229  VITAMINB12 193  FOLATE 12.2  FERRITIN 139  TIBC 258  IRON 49  RETICCTPCT 4.5*    Coagulation profile  Recent Labs Lab 12/07/15 1229  INR 1.23    No results for input(s): DDIMER in the last 72 hours.  Cardiac Enzymes No results for input(s): CKMB, TROPONINI,  MYOGLOBIN in the last 168 hours.  Invalid input(s): CK ------------------------------------------------------------------------------------------------------------------ Invalid input(s): POCBNP  No results for input(s): GLUCAP in the last 72 hours.   Thurnell Lose M.D. [facility] 12/10/2015, 10:28 AM  Pager: [phone] Between 7am to 7pm - call Pager - [phone]  After 7pm go to  [email or web address] - password [email or web address]  Call night coverage person covering after 7pm

## 2015-12-10 NOTE — Progress Notes (Addendum)
Peripherally Inserted Central Catheter/Midline Placement  The IV Nurse has discussed with the patient and/or persons authorized to consent for the patient, the purpose of this procedure and the potential benefits and risks involved with this procedure.  The benefits include less needle sticks, lab draws from the catheter and patient may be discharged home with the catheter.  Risks include, but not limited to, infection, bleeding, blood clot (thrombus formation), and puncture of an artery; nerve damage and irregular heat beat.  Alternatives to this procedure were also discussed.  Consent obtained from husband due to patient's altered mental status.  PICC/Midline Placement Documentation        Elijahjames Fuelling, Nicolette Bang 12/10/2015, 12:15 PM

## 2015-12-10 NOTE — Progress Notes (Signed)
[facility]  Subjective  - tired   Objective 110/60 60 97.3 F (36.3 C) (Oral) 18 98%  Intake/Output Summary (Last 24 hours) at 12/10/15 [id number] Last data filed at 12/09/15 2055  Gross per 24 hour  Intake    480 ml  Output    200 ml  Net    280 ml   Right groin incision healing Still with serous drainage from old drain site  Assessment/Planning: Severe protein calorie malnutrition.  Encouraged pt to use ensure or even food from family from home.   Renal cardiac overall situation per primary service  Ruta Hinds 12/10/2015 9:42 AM --  Laboratory Lab Results:  Recent Labs  12/09/15 0350 12/10/15 0316  WBC 22.4* 22.5*  HGB 9.3* 9.4*  HCT 29.7* 28.6*  PLT 131* 123*   BMET  Recent Labs  12/09/15 0350 12/10/15 0316  NA 143 142  K 3.3* 3.0*  CL 112* 110  CO2 21* 21*  GLUCOSE 106* 93  BUN 34* 32*  CREATININE 2.19* 2.24*  CALCIUM 8.5* 8.4*    COAG Lab Results  Component Value Date   INR 1.23 12/07/2015   INR 1.17 11/29/2015   INR 1.04 03/21/2014   No results found for: PTT

## 2015-12-10 NOTE — Consult Note (Signed)
Renal Service Consult Note [place] [facility] Check 12/10/2015 [place] Requesting Physician: Dr Candiss Norse   Reason for Consult:  Acute/ CRF HPI: The patient is a 70 y.o. year-old with hx of HTN, PVD, CAD, chroni systolic CHF ef [id number] ICD/defib placed.  Also depression, tobacco, HL, GERD, COPD, DVT, gout, OA and CKD and CVA.  Family provides hist.   Pt / family told that pt has only "30% kidney function" and this has been for sometime.  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  This was done on Mar 15th.  Since surgery she has been retaining fluid with I > O about 5-7 kg and gained weight about 9kg from 3/15 to 3/19.  No wts since then.  CXR showing early CHF and she has had leg edema. She was put on IV lasix but the creat has gone up with diuresis. Creat was 1.46 on admit and now is up to 2.24 today.  Patient in pain and just got dilaudid, no history from patient but family denies any recent c/o CP, n/v/d, cough or severe SOB.  +abd pain off and on.  Baseline creatinine has worsened since 2008 to 2016 from 0.92 > 1.2- 1.7 range.    Past Medical History  Past Medical History  Diagnosis Date  . PVD (peripheral vascular disease) ([place])     status post prior femoral-popliteal bypass  . Back pain   . Arthritis   . CAD (coronary artery disease)     status post prior stenting to the proximal RCA, status post anterior STEMI 10/2008 (RCA occluded at the prior stent, LAD treated with a bare metal stent and a drug-eluting stent),  . Chronic systolic heart failure (HCC)     echo 11/2011: EF 25-30%, AS and apical AK, trivial AI, mild MR, PASP 34.  . Anemia   . Ischemic cardiomyopathy   . AICD (automatic cardioverter/defibrillator) present   . HLD (hyperlipidemia)   . GERD (gastroesophageal reflux disease)   . RLS (restless legs syndrome)   . Depression   . Tobacco abuse   . Pain in joint,  lower leg   . Depressive disorder, not elsewhere classified   . Acute myocardial infarction of other anterior wall, subsequent episode of care 2010  . CHF (congestive heart failure) ([place])   . COPD (chronic obstructive pulmonary disease) ([place])   . DVT (deep venous thrombosis) ([place])   . Mixed hyperlipidemia   . Gout   . Atherosclerosis of native arteries of the extremities with intermittent claudication   . Osteoarthritis   . Eczema   . Fall at home July 5,  and  Oct.  2015  . Cancer (HCC)     Skin  of Left  Face- " upper eye"5/15  . Hypertension   . Anginal pain ([place])   . Shortness of breath dyspnea     with exertion  . Chronic kidney disease     "years ago, saw Dr. Mcarthur Rossetti"  "no one has said anything recently"  . Stroke [facility])     before Heart attack, 2010  . Neuropathy, idiopathic     peripherial   Past Surgical History  Past Surgical History  Procedure Laterality Date  . Psychologist, forensic    . Foot surgery Bilateral     removed nrves off bottomof feet."  . Pr vein bypass graft,aorto-fem-pop      fem fem  04/07/09  .  Abdominal hysterectomy    . Cardiac defibrillator placement    . Eye surgery  2013    Cataract bilateral   . Axillary-femoral bypass graft Right 06/29/2013    Procedure: BYPASS GRAFT AXILLA-RIGHT FEMORAL;  Surgeon: Rosetta Posner, MD;  Location: [facility];  Service: Vascular;  Laterality: Right;  . Femoral-tibial bypass graft  2002  . Femoral-femoral bypass graft  03/2009    S/P left to right fem-fem bypass graft  . Abdominal aortagram Bilateral 06/26/2013    Procedure: ABDOMINAL AORTAGRAM;  Surgeon: Elam Dutch, MD;  Location: [facility] CATH LAB;  Service: Cardiovascular;  Laterality: Bilateral;  . Colonoscopy    . Cleft lip repair N/A 05/10/2015    Procedure: Karapandandzic flap to lower lip ;  Surgeon: Irene Limbo, MD;  Location: [facility];  Service: Plastics;  Laterality: N/A;  . False aneurysm repair Right 11/27/2015    Procedure: Resection of Right Femoral Anastamotic  Pseudoaneurysm with Dacron Patch Angioplasy Right Common Femoral Artery ;  Surgeon: Mal Misty, MD;  Location: [facility];  Service: Vascular;  Laterality: Right;   Family History  Family History  Problem Relation Age of Onset  . Peripheral vascular disease Sister   . AAA (abdominal aortic aneurysm) Sister   . Diabetes Mother   . Varicose Veins Mother   . Peripheral vascular disease Mother   . Hypertension Mother   . Heart attack Father     Massive Heart Attack  . CAD Father    Social History  reports that she has been smoking Cigarettes.  She has a 55 pack-year smoking history. She has never used smokeless tobacco. She reports that she does not drink alcohol or use illicit drugs. Allergies  Allergies  Allergen Reactions  . Morphine     Other reaction(s): Other (See Comments) hallucinations REACTION: makes me crazy  . Gabapentin Other (See Comments)    300 mg upsets her stomach and 100 mg affect her sleep  . Naproxen Other (See Comments)    Blurred vision  . Prednisone Other (See Comments)    cranky   Home medications Prior to Admission medications   Medication Sig Start Date End Date Taking? Authorizing Provider  allopurinol (ZYLOPRIM) 100 MG tablet Take 100 mg by mouth daily.  09/28/13  Yes Historical Provider, MD  aspirin EC 81 MG tablet Take 81 mg by mouth daily.   Yes Historical Provider, MD  Cholecalciferol (VITAMIN D) 1000 UNITS capsule Take 1,000 Units by mouth daily.     Yes Historical Provider, MD  clonazePAM (KLONOPIN) 1 MG tablet Take 2 mg by mouth at bedtime.    Yes Historical Provider, MD  estrogens, conjugated, (PREMARIN) 1.25 MG tablet Take 1.25 mg by mouth daily.   Yes Historical Provider, MD  ezetimibe (ZETIA) 10 MG tablet Take 10 mg by mouth daily.   Yes Historical Provider, MD  ferrous sulfate 325 (65 FE) MG tablet Take 325 mg by mouth daily with breakfast.     Yes Historical Provider, MD  furosemide (LASIX) 20 MG tablet Take 30 mg by mouth daily.   Yes  Historical Provider, MD  HYDROcodone-acetaminophen (NORCO) 10-325 MG tablet Take 1 tablet by mouth every 6 (six) hours as needed (pain).  06/29/15  Yes Historical Provider, MD  ketoconazole (NIZORAL) 2 % cream Apply 1 application topically daily.  09/28/15  Yes Historical Provider, MD  KLOR-CON M20 20 MEQ tablet TAKE 1 TABLET BY MOUTH ON MONDAYS,WEDNESDAYS, AND FRIDAYS 09/30/15  Yes Evans Lance, MD  meclizine (ANTIVERT) 12.5 MG tablet Take 12.5 mg by mouth 3 (three) times daily as needed for dizziness.   Yes Historical Provider, MD  metoprolol succinate (TOPROL-XL) 50 MG 24 hr tablet Take 1 tablet (50 mg total) by mouth daily. Take with or immediately following a meal. 06/10/15  Yes Evans Lance, MD  nitroGLYCERIN (NITROSTAT) 0.4 MG SL tablet Place 0.4 mg under the tongue every 5 (five) minutes as needed for chest pain.   Yes Historical Provider, MD  pantoprazole (PROTONIX) 40 MG tablet Take 40 mg by mouth 2 (two) times daily.    Yes Historical Provider, MD  PRESCRIPTION MEDICATION Apply 1 application topically daily as needed (itching). Compounded cream:  Ketoconazole 2% cream/ fluticasone 0.05% cream 1:1   Yes Historical Provider, MD  ramipril (ALTACE) 2.5 MG capsule TAKE ONE CAPSULE BY MOUTH EVERY DAY 10/21/14  Yes Evans Lance, MD  traMADol (ULTRAM) 50 MG tablet Take 1 tablet by mouth daily as needed. Pain 09/21/15  Yes Historical Provider, MD  zolpidem (AMBIEN) 10 MG tablet Take 10 mg by mouth at bedtime.    Yes Historical Provider, MD  furosemide (LASIX) 20 MG tablet TAKE 1&1/2 TABLETS BY MOUTH DAILY Patient not taking: Reported on 12/04/2015 10/24/15   Evans Lance, MD   Liver Function Tests  Recent Labs Lab 12/09/15 0350  AST 11*  ALT 6*  ALKPHOS 62  BILITOT 1.1  PROT 4.8*  ALBUMIN 1.8*   No results for input(s): LIPASE, AMYLASE in the last 168 hours. CBC  Recent Labs Lab 12/08/15 0345 12/09/15 0350 12/10/15 0316  WBC 23.0* 22.4* 22.5*  HGB 9.3* 9.3* 9.4*  HCT 29.0* 29.7*  28.6*  MCV 94.5 95.2 94.4  PLT 141* 131* [id number]*   Basic Metabolic Panel  Recent Labs Lab 12/04/15 0735 12/05/15 0425 12/06/15 0328 12/07/15 0330 12/08/15 0345 12/09/15 0350 12/10/15 0316  NA 135 140 142 143 143 143 142  K 4.0 3.9 4.1 4.5 3.6 3.3* 3.0*  CL 110 116* 115* 113* 114* 112* 110  CO2 17* 17* 18* 19* 18* 21* 21*  GLUCOSE 108* 125* 111* 85 111* 106* 93  BUN 27* 25* 29* 34* 30* 34* 32*  CREATININE 1.37* 1.39* 1.64* 1.93* 1.97* 2.19* 2.24*  CALCIUM 8.0* 8.0* 8.5* 8.6* 8.6* 8.5* 8.4*    Filed Vitals:   12/09/15 2054 12/10/15 0624 12/10/15 1257 12/10/15 1533  BP: 140/76 110/60 115/65 97/54  Pulse: 77 60  59  Temp: 97.4 F (36.3 C) 97.3 F (36.3 C)  97.2 F (36.2 C)  TempSrc: Oral Oral  Axillary  Resp: 18 18 18 12   Height:      Weight:      SpO2: 98% 98%  95%   Exam Thin frail older adult female, sedated after dilaudid No rash, cyanosis or gangrene Sclera anicteric, throat clear  No jvd or bruits Chest bibasilar crackles scattered, no wheezing RRR no MRG Abd soft ntnd no mass or ascites +bs GU defer MS no joint effusions or deformity R groin wound healing well, no bruits Ext 1+ bilat pretib edema / no wounds or ulcers Neuro is lethargic, arousable, nonfocal  UA tntc wbcf, +yeast, tntc epi, 6-30 rbc, mod Hb, amber color, turbid on 3/22 CXR +vasc congestion 3/25, early CHF  Assessment: 1 Acute on CKD stage 3 - this appears to be cardiorenal syndrome.  Pt w severe CM and creatinine rising significantly with diuretics. Dorie Rank to ICU for inotropic support, agree with this plan.  Max dose IV  lasix so far has been 40 mg.  Once on inotropes should try a higher dosage of 60- 80mg  bid or tid.  Get daily wts. Poor dialysis candidate, have d/w family who feel the same.  They say she should be DNR as well, will d/w prim MD.  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 repair R fem PA 4 HTN on MTP only now 5 CAD hx CABG 6 COPD   Plan - as above, will follow.  Kelly Splinter  MD [employer] pager [phone]    cell [phone] 12/10/2015, 3:58 PM

## 2015-12-11 DIAGNOSIS — E785 Hyperlipidemia, unspecified: Secondary | ICD-10-CM

## 2015-12-11 LAB — CBC
HEMATOCRIT: 28.1 % — AB (ref 36.0–46.0)
Hemoglobin: 8.8 g/dL — ABNORMAL LOW (ref 12.0–15.0)
MCH: 29.6 pg (ref 26.0–34.0)
MCHC: 31.3 g/dL (ref 30.0–36.0)
MCV: 94.6 fL (ref 78.0–100.0)
PLATELETS: 123 10*3/uL — AB (ref 150–400)
RBC: 2.97 MIL/uL — ABNORMAL LOW (ref 3.87–5.11)
RDW: 17.1 % — AB (ref 11.5–15.5)
WBC: 24.7 10*3/uL — AB (ref 4.0–10.5)

## 2015-12-11 LAB — CARBOXYHEMOGLOBIN
Carboxyhemoglobin: 1.9 % — ABNORMAL HIGH (ref 0.5–1.5)
Methemoglobin: 1 % (ref 0.0–1.5)
O2 Saturation: 75.5 %
Total hemoglobin: 8.7 g/dL — ABNORMAL LOW (ref 12.0–16.0)

## 2015-12-11 LAB — BASIC METABOLIC PANEL
Anion gap: 7 (ref 5–15)
BUN: 27 mg/dL — AB (ref 6–20)
CHLORIDE: 116 mmol/L — AB (ref 101–111)
CO2: 21 mmol/L — ABNORMAL LOW (ref 22–32)
CREATININE: 1.64 mg/dL — AB (ref 0.44–1.00)
Calcium: 6.7 mg/dL — ABNORMAL LOW (ref 8.9–10.3)
GFR calc Af Amer: 36 mL/min — ABNORMAL LOW (ref >60)
GFR calc non Af Amer: 31 mL/min — ABNORMAL LOW (ref >60)
GLUCOSE: 98 mg/dL (ref 65–99)
POTASSIUM: 2.2 mmol/L — AB (ref 3.5–5.1)
SODIUM: 144 mmol/L (ref 135–145)

## 2015-12-11 LAB — POTASSIUM: POTASSIUM: 5.1 mmol/L (ref 3.5–5.1)

## 2015-12-11 LAB — MAGNESIUM
Magnesium: 1.3 mg/dL — ABNORMAL LOW (ref 1.7–2.4)
Magnesium: 2.8 mg/dL — ABNORMAL HIGH (ref 1.7–2.4)

## 2015-12-11 MED ORDER — MAGNESIUM SULFATE 2 GM/50ML IV SOLN
2.0000 g | Freq: Once | INTRAVENOUS | Status: AC
  Administered 2015-12-11: 2 g via INTRAVENOUS
  Filled 2015-12-11: qty 50

## 2015-12-11 MED ORDER — HYDROMORPHONE HCL 1 MG/ML IJ SOLN
2.0000 mg | INTRAMUSCULAR | Status: DC | PRN
  Administered 2015-12-11: 1 mg via INTRAVENOUS
  Administered 2015-12-11 – 2015-12-12 (×3): 2 mg via INTRAVENOUS
  Administered 2015-12-12 (×3): 1 mg via INTRAVENOUS
  Filled 2015-12-11 (×5): qty 2

## 2015-12-11 MED ORDER — POTASSIUM CHLORIDE CRYS ER 20 MEQ PO TBCR
40.0000 meq | EXTENDED_RELEASE_TABLET | ORAL | Status: AC
  Administered 2015-12-11 (×2): 40 meq via ORAL
  Filled 2015-12-11 (×2): qty 2

## 2015-12-11 MED ORDER — HEPARIN SODIUM (PORCINE) 5000 UNIT/ML IJ SOLN
5000.0000 [IU] | Freq: Three times a day (TID) | INTRAMUSCULAR | Status: DC
  Administered 2015-12-11 – 2015-12-12 (×4): 5000 [IU] via SUBCUTANEOUS
  Filled 2015-12-11 (×3): qty 1

## 2015-12-11 MED ORDER — POTASSIUM CHLORIDE 10 MEQ/100ML IV SOLN
10.0000 meq | INTRAVENOUS | Status: AC
  Administered 2015-12-11 (×3): 10 meq via INTRAVENOUS
  Filled 2015-12-11 (×4): qty 100

## 2015-12-11 MED ORDER — HYDROMORPHONE HCL 1 MG/ML IJ SOLN
INTRAMUSCULAR | Status: AC
  Filled 2015-12-11: qty 1

## 2015-12-11 MED ORDER — POTASSIUM CHLORIDE 10 MEQ/50ML IV SOLN
10.0000 meq | INTRAVENOUS | Status: AC
  Administered 2015-12-11 (×3): 10 meq via INTRAVENOUS
  Filled 2015-12-11 (×3): qty 50

## 2015-12-11 MED ORDER — POTASSIUM CHLORIDE 10 MEQ/100ML IV SOLN
10.0000 meq | INTRAVENOUS | Status: DC
  Administered 2015-12-11: 10 meq via INTRAVENOUS
  Filled 2015-12-11: qty 100

## 2015-12-11 NOTE — Progress Notes (Signed)
Md notified of pt's labs this potassium 2.2, cal 6.7 and mag 1.3.  Will continue to monitor Saunders Revel T

## 2015-12-11 NOTE — Progress Notes (Signed)
[facility]  Subjective  - feels maybe a little better, still not really eating   Objective 108/48 65 97.4 F (36.3 C) (Axillary) 10 99%  Intake/Output Summary (Last 24 hours) at 12/11/15 0856 Last data filed at 12/11/15 0800  Gross per 24 hour  Intake 511.44 ml  Output   1850 ml  Net -1338.56 ml   Right groin incision healing still with some serous drainage  Assessment/Planning: Severe protein calorie malnutrition not really taking much PO, consider nasoenteric tube for feeding, will leave at discretion of primary service but in her current state I do not believe she will eat enough to catch up which is compromising her immune system and overall wound healing and general condition  Right groin continues to heal but still with serous drainage from drain site most likely secondary to decreased oncotic pressure.  Cardiology trying to improve overall function  Appreciate care of all involved services.  Ruta Hinds 12/11/2015 8:56 AM --  Laboratory Lab Results:  Recent Labs  12/10/15 0316 12/11/15 0300  WBC 22.5* 24.7*  HGB 9.4* 8.8*  HCT 28.6* 28.1*  PLT 123* 123*   BMET  Recent Labs  12/10/15 0316 12/11/15 0300  NA 142 144  K 3.0* 2.2*  CL 110 116*  CO2 21* 21*  GLUCOSE 93 98  BUN 32* 27*  CREATININE 2.24* 1.64*  CALCIUM 8.4* 6.7*    COAG Lab Results  Component Value Date   INR 1.23 12/07/2015   INR 1.17 12/11/2015   INR 1.04 03/21/2014   No results found for: PTT

## 2015-12-11 NOTE — Progress Notes (Signed)
The last of 8 runs total of IV potassium has finished.  Dr. Candiss Norse had ordered that 8 runs be given rather than 10.  Will wait 2 hours before drawing chem profile.

## 2015-12-11 NOTE — Progress Notes (Addendum)
PATIENT ID: [name] is a 51F with CAD, chronic systolic and diastolic heart failure (LVEF 20-25%, grade 1 DD), s/p ICD, hyperlipidemia, DVT, CKD and prior CVA initially admitted to the vascular service and underwent resection of R femoral anastamotic pseudoanerysm with Dacron patch angioplasty of the R common femoral artery.  Now transferred to the Hospitalist service for protracted post-surgical care.    INTERVAL HISTORY: [name] had a PICC line placed and Co-ox was 53.  Milrinone was started and lasix was resumed.  She diuresed -1.1L and renal function has improved from 2.2 to 1.6.    SUBJECTIVE: [name] is feeling better today.  She is eating this AM.  PHYSICAL EXAM Filed Vitals:   12/11/15 0000 12/11/15 0157 12/11/15 0302 12/11/15 0719  BP: 97/49   108/48  Pulse: 66 72 70 65  Temp:   97.5 F (36.4 C) 97.4 F (36.3 C)  TempSrc:   Oral Axillary  Resp: 16 15 12 10   Height:      Weight:  54.1 kg (119 lb 4.3 oz)    SpO2: 94% 100% 100% 99%   General:  Chronically ill-appearing, frail, elderly woman in significant distress due to pain.  Neck: JVP 1cm above clavicle at 45 degrees.  Lungs:  Diminished breath sounds with crackles at bilateral bases. Heart:  Mostly regular with occasional ectopy.  No m/r/g.  Abdomen:  Soft, NT, ND.  +BS Extremities:  Cool.  Multiple ecchymoses.  R upper leg dressing oozing serosanguineous fluid. No edema.   LABS: Lab Results  Component Value Date   TROPONINI 0.12* 12/02/2015   Results for orders placed or performed during the hospital encounter of 11/24/2015 (from the past 24 hour(s))  Brain natriuretic peptide     Status: Abnormal   Collection Time: 12/10/15  8:44 AM  Result Value Ref Range   B Natriuretic Peptide 2104.9 (H) 0.0 - 100.0 pg/mL  Carboxyhemoglobin     Status: Abnormal   Collection Time: 12/10/15 10:30 AM  Result Value Ref Range   Total hemoglobin 9.0 (L) 12.0 - 16.0 g/dL   O2 Saturation 55.8 %   Carboxyhemoglobin 1.9 (H)  0.5 - 1.5 %   Methemoglobin 0.8 0.0 - 1.5 %  MRSA PCR Screening     Status: None   Collection Time: 12/10/15  1:08 PM  Result Value Ref Range   MRSA by PCR NEGATIVE NEGATIVE  Carboxyhemoglobin     Status: Abnormal   Collection Time: 12/11/15  2:58 AM  Result Value Ref Range   Total hemoglobin 8.7 (L) 12.0 - 16.0 g/dL   O2 Saturation 75.5 %   Carboxyhemoglobin 1.9 (H) 0.5 - 1.5 %   Methemoglobin 1.0 0.0 - 1.5 %  Basic metabolic panel     Status: Abnormal   Collection Time: 12/11/15  3:00 AM  Result Value Ref Range   Sodium 144 135 - 145 mmol/L   Potassium 2.2 (LL) 3.5 - 5.1 mmol/L   Chloride 116 (H) 101 - 111 mmol/L   CO2 21 (L) 22 - 32 mmol/L   Glucose, Bld 98 65 - 99 mg/dL   BUN 27 (H) 6 - 20 mg/dL   Creatinine, Ser 1.64 (H) 0.44 - 1.00 mg/dL   Calcium 6.7 (L) 8.9 - 10.3 mg/dL   GFR calc non Af Amer 31 (L) >60 mL/min   GFR calc Af Amer 36 (L) >60 mL/min   Anion gap 7 5 - 15  CBC     Status: Abnormal   Collection  Time: 12/11/15  3:00 AM  Result Value Ref Range   WBC 24.7 (H) 4.0 - 10.5 K/uL   RBC 2.97 (L) 3.87 - 5.11 MIL/uL   Hemoglobin 8.8 (L) 12.0 - 15.0 g/dL   HCT 28.1 (L) 36.0 - 46.0 %   MCV 94.6 78.0 - 100.0 fL   MCH 29.6 26.0 - 34.0 pg   MCHC 31.3 30.0 - 36.0 g/dL   RDW 17.1 (H) 11.5 - 15.5 %   Platelets 123 (L) 150 - 400 K/uL  Magnesium     Status: Abnormal   Collection Time: 12/11/15  3:00 AM  Result Value Ref Range   Magnesium 1.3 (L) 1.7 - 2.4 mg/dL    Intake/Output Summary (Last 24 hours) at 12/11/15 0817 Last data filed at 12/11/15 0800  Gross per 24 hour  Intake 511.44 ml  Output   1850 ml  Net -1338.56 ml    Telemetry: Sinus rhythm. Occasional PVCs.   ASSESSMENT AND PLAN:  Principal Problem:   AKI (acute kidney injury) ([place]) Active Problems:   ANEMIA NEC   Essential hypertension   CKD (chronic kidney disease)   False aneurysm (HCC)   Pseudoaneurysm of right femoral artery (HCC)   Ischemic chest pain (HCC)   Coronary artery disease due to  lipid rich plaque   Cardiomyopathy, ischemic   Leukocytosis   HCAP (healthcare-associated pneumonia)   Physical deconditioning   Abdominal pain   Ascites   Mesenteric vascular insufficiency, chronic (HCC)   Acute on chronic systolic heart failure ([place])   # Acute on chronic systolic and diastolic heart failure:  # Acute on chronic renal failure:  LVEF 20-25%.  Co-Ox was low so milrinone was started with improvement in her renal function.  Repeat Co-Ox this am has improved significantly.  Continue milrinone and lasix.  Will continue metoprolol given that she has frequent PVCs and is improving with this on board.  Ultimately she is not likely to be a home inotropes candidate. She is very frail and would not be a candidate for advanced therapies. We will continue milrinone in the hospital in an attempt to diurese her and get her onto a stable oral regimen of diuretics.  Lasix 80 mg IV BID.  # Acute on chronic renal failure:  Renal function improved with milrinone.  Holding ACE-I/ARB.  Continue diuresis as above.    # CAD: Continue aspirin and metoprolol.  She is not on a statin.     Parissa Chiao C. Oval Linsey, MD, [facility] 12/11/2015 8:17 AM

## 2015-12-11 NOTE — Progress Notes (Signed)
[facility] progress note                                                                               Patient Demographics  [name], is a 70 y.o. female, DOB - May 23, 1946, [id number]  Admit date - 11/21/2015   Admitting Physician Waldemar Dickens, MD  Outpatient Primary MD for the patient is Shirline Frees, MD  LOS - 11  days    Chief Complaint  Patient presents with  . Groin Pain  . Back Pain       Brief HPI   70 year old Female with CAD, chronic systolic heart failure, anemia, ASCVD, tobacco use, CHF . Admitted by vascular with severe PAD and underwent right femoral pseudoaneurysm expansion on March15th. Since that time she's had slow progression. Her chronic kidney disease slightly worsening as well as her chronic IDA, had transfusion on 3/20. Patient complained of abdominal pain. IM service consulted for medical management, worsening of anemia, renal function, HCAP.     Subjective:   Patient in bed, husband bedside, denies any headache, no fever or chills, no chest pain or cough, no shortness of breath or abdominal pain. No focal weakness.   Assessment & Plan    Acute on chronic kidney injury.  Baseline around 1.3 She Developed cardiorenal syndrome, cardiology and nephrology were consulted, she underwent PICC line placement on 12/10/2015 and combination of milrinone and Lasix was started, her renal function has since improved. She also had an stable renal ultrasound.  Cardiomyopathy ischemic/ acute on chronic systolic and diastolic heart failure. Chart review indicates echo 2013 with an EF of 25-30% - she Developed cardiorenal syndrome this admission, underwent PICC line placement on 12/10/2015 by cardiology after which milrinone was started with good effect, renal function has improved, however patient looks like a poor long-term candidate for inotropes at home. Continue low-dose beta blocker. No ACE/ARB due to renal failure.    Leukocytosis.  Blood culture 1/2 coagulase negative staph  likely contaminant  Initially treated for pneumonia, also had evidence of UTI, blood cultures 1 out of 2 coag-negative staph which is contamination. She is afebrile. No cough or shortness of breath, no oxygen demand. Right groin site appears stable. I do not think clinically she has pneumonia. She has been treated with vancomycin and Zosyn for at least 4 days, we'll stop all antibiotics and monitor clinically. Encourage the patient to sit up in the chair, use incentive spirometer and flutter valve both of which have been added. She does have atelectasis and at risk for developing pneumonia.   History of iron deficiency anemia and anemia of chronic disease - transfusion of packed RBC on 3/20,  H&H currently stable    Abdominal pain/constipation, hypoalbuminemia (1.8)- Colitis per recent CT abdomen on 3/18 versus ileus. GI following. Per GI patient has evidence of chronic bowel ischemia, no acute etiologies, abdominal exam is benign, no diarrhea, will monitor. Bowel regimen for constipation. Does have some anasarca and third spacing of fluid. We will add protein supplementation and monitor.  Hypertension. Controlled on beta blocker.    Deconditioning  The patient is currently very debilitated and deconditioned, needs  physical therapy and mobilization.   Recent right femoral artery aneurysm surgery management per vascular surgery which was the primary team. Case transferred to hospitalist service on 12/09/2015. Vascular surgery will continue to follow.   Hypokalemia and hypomagnesemia. Replaced will monitor.  Incidental 9 mm left upper pole renal calculus. Outpt follow.  Generalized deconditioning, extremely frail status, appears to have severe protein calorie malnutrition. Place her protein supplementation, family now wants DO NOT RESUSCITATE. We'll request palliative care to evaluate as well for goals of care.    Family Communication: Discussed with  husband in detail on 12/10/2015.   Disposition Plan:  Likely SNF on Monday or Tuesday  Code status DO NOT RESUSCITATE.  Consults. Cardiology, renal, vascular surgery  DVT prophylaxis. Will start heparin on 12/11/2015   Time Spent in minutes  25 minutes   Medications  Scheduled Meds: . aspirin EC  81 mg Oral Daily  . clonazePAM  1 mg Oral QHS  . docusate sodium  200 mg Oral BID  . ENSURE  237 mL Oral TID WC  . estrogens (conjugated)  1.25 mg Oral Daily  . ezetimibe  10 mg Oral Daily  . feeding supplement (PRO-STAT SUGAR FREE 64)  30 mL Oral TID WC  . ferrous sulfate  325 mg Oral Q breakfast  . furosemide  80 mg Intravenous BID  . metoprolol succinate  50 mg Oral Daily  . pantoprazole  40 mg Oral BID  . polyethylene glycol  17 g Oral BID  . potassium chloride  10 mEq Intravenous Q1 Hr x 6  . potassium chloride  40 mEq Oral Q4H  . sodium chloride flush  10-40 mL Intracatheter Q12H   Continuous Infusions: . milrinone 0.25 mcg/kg/min (12/10/15 1951)   PRN Meds:.acetaminophen **OR** [DISCONTINUED] acetaminophen, albuterol, hydrALAZINE, HYDROcodone-acetaminophen, HYDROmorphone (DILAUDID) injection, ipratropium-albuterol, meclizine, metoprolol, nitroGLYCERIN, ondansetron, phenol, sodium chloride flush   Antibiotics   Anti-infectives    Start     Dose/Rate Route Frequency Ordered Stop   12/09/15 1500  vancomycin (VANCOCIN) IVPB 750 mg/150 ml premix  Status:  Discontinued     750 mg 150 mL/hr over 60 Minutes Intravenous Every 48 hours 12/07/15 1435 12/10/15 1027   12/08/15 1800  piperacillin-tazobactam (ZOSYN) IVPB 2.25 g  Status:  Discontinued     2.25 g 100 mL/hr over 30 Minutes Intravenous 4 times per day 12/08/15 1059 12/10/15 1027   12/08/15 1200  piperacillin-tazobactam (ZOSYN) IVPB 3.375 g     3.375 g 100 mL/hr over 30 Minutes Intravenous  Once 12/08/15 1059 12/08/15 1245   12/07/15 1500  vancomycin (VANCOCIN) IVPB 1000 mg/200 mL premix     1,000 mg 200 mL/hr over 60  Minutes Intravenous  Once 12/07/15 1435 12/07/15 1806   12/07/15 1500  ceFEPIme (MAXIPIME) 1 g in dextrose 5 % 50 mL IVPB  Status:  Discontinued     1 g 100 mL/hr over 30 Minutes Intravenous Every 24 hours 12/07/15 1435 12/08/15 1026   12/07/15 1400  ceFEPIme (MAXIPIME) 1 g in dextrose 5 % 50 mL IVPB  Status:  Discontinued     1 g 100 mL/hr over 30 Minutes Intravenous Every 24 hours 12/07/15 1339 12/07/15 1435   12/03/15 1000  piperacillin-tazobactam (ZOSYN) IVPB 3.375 g  Status:  Discontinued     3.375 g 12.5 mL/hr over 240 Minutes Intravenous Every 8 hours 12/03/15 0944 12/07/15 1339   12/01/15 0400  cefUROXime (ZINACEF) 1.5 g in dextrose 5 % 50 mL IVPB     1.5 g 100  mL/hr over 30 Minutes Intravenous Every 12 hours 12/01/15 0057 12/01/15 1742        Objective:   Filed Vitals:   12/11/15 0000 12/11/15 0157 12/11/15 0302 12/11/15 0719  BP: 97/49   108/48  Pulse: 66 72 70 65  Temp:   97.5 F (36.4 C) 97.4 F (36.3 C)  TempSrc:   Oral Axillary  Resp: 16 15 12 10   Height:      Weight:  54.1 kg (119 lb 4.3 oz)    SpO2: 94% 100% 100% 99%    Intake/Output Summary (Last 24 hours) at 12/11/15 0923 Last data filed at 12/11/15 0800  Gross per 24 hour  Intake 511.44 ml  Output   1850 ml  Net -1338.56 ml     Wt Readings from Last 3 Encounters:  12/11/15 54.1 kg (119 lb 4.3 oz)  09/27/15 48.353 kg (106 lb 9.6 oz)  09/14/15 48.081 kg (106 lb)     Exam  General: Alert and oriented, appears ill  HEENT:    Neck:   CVS: S1clear, regular rate and rhythm.  Respiratory: diffuse coarse breath sounds   Abdomen: Soft,  mild diffuse tenderness although improving from yesterday, + bowel sounds  Ext: no cyanosis clubbing,  RLE edema 1+, left lower extremity no edema, right groin dressing intact   Neuro: able to lift up her legs, strength 5/5 upper extremities  Skin: No rashes  Psych: much more alert today   Data Reviewed:  I have personally reviewed following labs and  imaging studies  Micro Results Recent Results (from the past 240 hour(s))  MRSA PCR Screening     Status: None   Collection Time: 12/01/15  5:10 PM  Result Value Ref Range Status   MRSA by PCR NEGATIVE NEGATIVE Final    Comment:        The GeneXpert MRSA Assay (FDA approved for NASAL specimens only), is one component of a comprehensive MRSA colonization surveillance program. It is not intended to diagnose MRSA infection nor to guide or monitor treatment for MRSA infections.   Culture, blood (Routine X 2) w Reflex to ID Panel     Status: None   Collection Time: 12/02/15  3:51 PM  Result Value Ref Range Status   Specimen Description BLOOD RIGHT ANTECUBITAL  Final   Special Requests BOTTLES DRAWN AEROBIC AND ANAEROBIC 5CC  Final   Culture NO GROWTH 5 DAYS  Final   Report Status 12/07/2015 FINAL  Final  Culture, blood (Routine X 2) w Reflex to ID Panel     Status: None   Collection Time: 12/02/15  3:55 PM  Result Value Ref Range Status   Specimen Description BLOOD RIGHT HAND  Final   Special Requests IN PEDIATRIC BOTTLE 2CC  Final   Culture NO GROWTH 5 DAYS  Final   Report Status 12/07/2015 FINAL  Final  Urine culture     Status: None   Collection Time: 12/07/15  2:06 PM  Result Value Ref Range Status   Specimen Description URINE, RANDOM  Final   Special Requests ADDED [id number]  Final   Culture >=100,000 COLONIES/mL YEAST  Final   Report Status 12/09/2015 FINAL  Final  Culture, blood (routine x 2) Call MD if unable to obtain prior to antibiotics being given     Status: None (Preliminary result)   Collection Time: 12/07/15  3:00 PM  Result Value Ref Range Status   Specimen Description BLOOD LEFT HAND  Final   Special Requests IN  PEDIATRIC BOTTLE 1CC  Final   Culture NO GROWTH 3 DAYS  Final   Report Status PENDING  Incomplete  Culture, blood (routine x 2) Call MD if unable to obtain prior to antibiotics being given     Status: None   Collection Time: 12/07/15  3:10 PM   Result Value Ref Range Status   Specimen Description BLOOD RIGHT HAND  Final   Special Requests IN PEDIATRIC BOTTLE 1CC  Final   Culture  Setup Time   Final    GRAM POSITIVE COCCI IN CLUSTERS AEROBIC BOTTLE ONLY CRITICAL RESULT CALLED TO, READ BACK BY AND VERIFIED WITH: A SAMUELSON 12/08/15 @ 36 M VESTAL    Culture   Final    STAPHYLOCOCCUS SPECIES (COAGULASE NEGATIVE) THE SIGNIFICANCE OF ISOLATING THIS ORGANISM FROM A SINGLE SET OF BLOOD CULTURES WHEN MULTIPLE SETS ARE DRAWN IS UNCERTAIN. PLEASE NOTIFY THE MICROBIOLOGY DEPARTMENT WITHIN ONE WEEK IF SPECIATION AND SENSITIVITIES ARE REQUIRED.    Report Status 12/10/2015 FINAL  Final  MRSA PCR Screening     Status: None   Collection Time: 12/10/15  1:08 PM  Result Value Ref Range Status   MRSA by PCR NEGATIVE NEGATIVE Final    Comment:        The GeneXpert MRSA Assay (FDA approved for NASAL specimens only), is one component of a comprehensive MRSA colonization surveillance program. It is not intended to diagnose MRSA infection nor to guide or monitor treatment for MRSA infections.     Radiology Reports Ct Abdomen Pelvis Wo Contrast  12/08/2015  CLINICAL DATA:  Diffuse abdominal pain for 1 day. EXAM: CT ABDOMEN AND PELVIS WITHOUT CONTRAST TECHNIQUE: Multidetector CT imaging of the abdomen and pelvis was performed following the standard protocol without IV contrast. COMPARISON:  12/03/2015. FINDINGS: Lower chest: Persistent and slightly larger bilateral pleural effusions and overlying atelectasis or infiltrates. The heart is enlarged. Stable aortic and coronary artery calcifications. Hepatobiliary: No focal hepatic lesions or intrahepatic biliary dilatation. The gallbladder is surgically absent. Pancreas: No mass, inflammation or ductal dilatation. Spleen: Normal size.  No focal lesions. Adrenals/Urinary Tract: Small left kidney is stable. Left renal calculus is unchanged. No hydroureteronephrosis. Stomach/Bowel: The stomach is  displaced superiorly and laterally by a a large amount of fluid in the lesser sac. There is also increasing abdominal and pelvic ascites. The duodenum, small bowel and colon are grossly normal. No inflammatory changes, mass lesions or obstructive findings. No leaking oral contrast or free air. Vascular/Lymphatic: Stable tortuous, ectatic and calcified abdominal aorta. No mesenteric or retroperitoneal mass or adenopathy. Stable surgical changes from an right axillofemoral and fem-fem bypass graft. 5.0 x 2.3 cm hematoma noted near the right femoral graft. Other: Worsening diffuse body wall edema suggesting anasarca. The bladder is mildly distended. It does contain air but this could be from recent catheterization. Increasing free pelvic fluid. Musculoskeletal: No significant/acute osseous findings. IMPRESSION: 1. Enlarging bilateral pleural effusions and bibasilar atelectasis/infiltrates. 2. Increasing abdominal and pelvic fluid. There is a significant amount of fluid in the lesser sac which has mass effect on the stomach. No findings for perforation. 3. Worsening diffuse body wall edema suggesting anasarca. 4. Stable surgical changes from a right axillofemoral bypass graft and fem-fem bypass graft. Resolving hematoma in the right upper groin area. Electronically Signed   By: Marijo Sanes M.D.   On: 12/08/2015 17:18   Ct Abdomen Pelvis Wo Contrast  12/03/2015  CLINICAL DATA:  Status post axillofemoral bypass with elevated white blood cell count. EXAM: CT ABDOMEN AND  PELVIS WITHOUT CONTRAST TECHNIQUE: Multidetector CT imaging of the abdomen and pelvis was performed following the standard protocol without IV contrast. COMPARISON:  November 30, 2015 FINDINGS: New bilateral effusions, left greater than right. Underlying opacity is likely atelectasis. Cardiomegaly persists. No other abnormalities identified in the lung bases. No free air. There is increased attenuation in the subcutaneous fat consistent with volume  overload. There is also ascites within the abdomen, particularly the left paracolic gutter, and in the pelvis. The left-sided predominance of ascites could be due to the fact the patient is tilted to the left. Patient is status post cholecystectomy. The liver, spleen, and right adrenal gland are normal. Prominence of the left adrenal gland is likely due to hyperplasia the unchanged. A nonobstructing left renal stone is again identified. Right kidney is unchanged and unremarkable. Pancreas demonstrates no abnormalities. 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 Atherosclerosis. No adenopathy. The stomach is normal. A few mildly prominent loops of proximal small-bowel are contrast filled and thin walled measuring up to 3 cm. Contrast does extend all the way to the colon. The descending colon is decompressed. The descending colon does demonstrate apparent wall thickening. No pericolonic fat stranding in regions without ascites. The more proximal and well-distended colon is normal. The appendix is not seen but there is no secondary evidence of appendicitis. The pelvis demonstrates previous hysterectomy. The bladder is partially decompressed with a Foley catheter. No adenopathy. There is hematoma surrounding the surgical site in the right groin. Patency and integrity of the graft cannot be assessed on this study due to lack of contrast. However, previously seen aneurysm is no longer visualized. Visualized bones are unremarkable. IMPRESSION: 1. Postoperative changes in the right groin with surrounding hematoma. The hematoma measures 3.6 by 2.9 cm on axial image 82 and 8.5 cm in cranial caudal dimension on image 24. The small amount of adjacent air in the soft tissues may all be postsurgical. 2. Wall thickening in the descending colon could be due to poor distention, as suggested by lack of pericolonic stranding in sites without ascites. However, given fever, colitis,  is not completely excluded. Recommend clinical correlation. 3. Fluid overload with ascites and abdominal wall edema. Electronically Signed   By: Dorise Bullion III M.D   On: 12/03/2015 13:26   Ct Abdomen Pelvis Wo Contrast  12/11/2015  CLINICAL DATA:  Right groin pain and swelling, initial encounter EXAM: CT ABDOMEN AND PELVIS WITHOUT CONTRAST TECHNIQUE: Multidetector CT imaging of the abdomen and pelvis was performed following the standard protocol without IV contrast. COMPARISON:  07/09/2007 FINDINGS: Lung bases are free of acute infiltrate or sizable effusion. The gallbladder has been surgically removed. The liver, spleen, adrenal glands and pancreas are within normal limits. The kidneys are well visualized without renal calculi. A cortical calcification is noted in the left kidney. The abdominal aorta is dilated to 3.1 cm. There are changes consistent with a right axillobifemoral bypass graft. 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 The actual degree and patency within the aneurysmal dilatation cannot be assessed on this exam due to the lack of IV contrast. The femoral crossover graft appears within normal limits. The bladder is well distended. No pelvic mass lesion or sidewall abnormality is noted. The appendix is not well visualized. The osseous structures are within normal limits. IMPRESSION: Changes consistent with aneurysmal dilatation at the touchdown site of the axillo-bifemoral bypass graft  in the right groin. The degree of patency of the aneurysmal dilatation is uncertain as no contrast was administered for this exam. Ultrasound may be helpful for further evaluation. Vascular surgery consultation recommended. Dilatation of the abdominal aorta to 3 cm. Recommend followup by ultrasound in 3 years. This recommendation follows ACR consensus guidelines: White Paper of the ACR Incidental Findings Committee II on Vascular Findings. Natasha Mead Coll Radiol 2013; 10:789-794 Electronically Signed   By: Inez Catalina M.D.   On: 12/07/2015 19:38   US Renal  12/10/2015  CLINICAL DATA:  Acute renal failure EXAM: RENAL / URINARY TRACT ULTRASOUND COMPLETE COMPARISON:  CT abdomen pelvis dated 12/08/2015 FINDINGS: Right Kidney: Length: 9.3 cm.  Poorly visualized.  No mass or hydronephrosis. Left Kidney: Length: 8.0 cm.  9 mm upper pole renal calculus.  No hydronephrosis. Bladder: Within normal limits. IMPRESSION: 9 mm left upper pole renal calculus. No hydronephrosis. Electronically Signed   By: Julian Hy M.D.   On: 12/10/2015 17:09   Dg Chest Port 1 View  12/10/2015  CLINICAL DATA:  Short of breath EXAM: PORTABLE CHEST 1 VIEW COMPARISON:  12/07/2015. FINDINGS: LEFT-sided pacer with single continuously overlies enlarged cardiac silhouette. There is chronic pulmonary arteries not changed. LEFT basilar atelectasis similar prior. Mild central venous congestion. IMPRESSION: 1. Mild central venous congestion which is mildly worsened. 2. Stable LEFT basilar atelectasis versus infiltrate Electronically Signed   By: Suzy Bouchard M.D.   On: 12/10/2015 07:35   Dg Chest Port 1 View  12/07/2015  CLINICAL DATA:  Shortness of breath with productive cough EXAM: PORTABLE CHEST 1 VIEW COMPARISON:  12/02/15 chest radiograph. FINDINGS: Stable configuration of single lead left subclavian ICD with lead tip overlying the right ventricle. Stable cardiomediastinal silhouette with mild cardiomegaly. No pneumothorax. New small left pleural effusion. New mild pulmonary edema. Patchy right infrahilar opacity appears new. Patchy consolidation at the left lung base appears increased. IMPRESSION: 1. Mild congestive heart failure. 2. New small left pleural effusion. 3. New patchy right infrahilar opacity and increased patchy left lung base consolidation, suspicious for a combination of atelectasis, pneumonia and/or aspiration. Recommend follow-up chest imaging to resolution.  Electronically Signed   By: Ilona Sorrel M.D.   On: 12/07/2015 10:35   Dg Chest Port 1 View  12/02/2015  CLINICAL DATA:  Shortness of breath. Status post surgical repair of right femoral anastomotic pseudoaneurysm. EXAM: PORTABLE CHEST 1 VIEW COMPARISON:  12/03/2015 FINDINGS: Stable appearance of ICD/pacing device. The heart size and mediastinal contours are normal. Lung volumes are normal without significant atelectasis identified. There is no evidence of pulmonary edema, consolidation, pneumothorax, nodule or pleural fluid. IMPRESSION: No active disease. Electronically Signed   By: Aletta Edouard M.D.   On: 12/02/2015 08:34   Dg Chest Portable 1 View  12/15/2015  CLINICAL DATA:  Chest pain for 1 day EXAM: PORTABLE CHEST 1 VIEW COMPARISON:  Chest x-ray dated 09/15/2013. FINDINGS: Mild cardiomegaly is stable. Left chest wall pacemaker/AICD stable in position. Atherosclerotic changes again noted at the aortic arch. Overall cardiomediastinal silhouette is stable in size and configuration. Lungs are clear. No evidence of pneumonia. No pleural effusion. No pneumothorax seen. Osseous structures about the chest are unremarkable. IMPRESSION: Stable chest x-ray. Stable mild cardiomegaly. Lungs are clear and there is no evidence of acute cardiopulmonary abnormality. Electronically Signed   By: Franki Cabot M.D.   On: 12/13/2015 20:18    CBC  Recent Labs Lab 12/07/15 0330 12/08/15 0345 12/09/15 0350 12/10/15 0316 12/11/15 0300  WBC 19.7* 23.0* 22.4* 22.5* 24.7*  HGB 8.7* 9.3* 9.3* 9.4* 8.8*  HCT 27.2* 29.0* 29.7* 28.6* 28.1*  PLT 123* 141* 131* 123* 123*  MCV 93.2 94.5 95.2 94.4 94.6  MCH 29.8 30.3 29.8 31.0 29.6  MCHC 32.0 32.1 31.3 32.9 31.3  RDW 17.1* 17.4* 17.3* 17.5* 17.1*    Chemistries   Recent Labs Lab 12/07/15 0330 12/08/15 0345 12/09/15 0350 12/10/15 0316 12/11/15 0300  NA 143 143 143 142 144  K 4.5 3.6 3.3* 3.0* 2.2*  CL 113* 114* 112* 110 116*  CO2 19* 18* 21* 21* 21*   GLUCOSE 85 111* 106* 93 98  BUN 34* 30* 34* 32* 27*  CREATININE 1.93* 1.97* 2.19* 2.24* 1.64*  CALCIUM 8.6* 8.6* 8.5* 8.4* 6.7*  MG  --   --   --   --  1.3*  AST  --   --  11*  --   --   ALT  --   --  6*  --   --   ALKPHOS  --   --  62  --   --   BILITOT  --   --  1.1  --   --    ------------------------------------------------------------------------------------------------------------------ estimated creatinine clearance is 27.7 mL/min (by C-G formula based on Cr of 1.64). ------------------------------------------------------------------------------------------------------------------ No results for input(s): HGBA1C in the last 72 hours. ------------------------------------------------------------------------------------------------------------------ No results for input(s): CHOL, HDL, LDLCALC, TRIG, CHOLHDL, LDLDIRECT in the last 72 hours. ------------------------------------------------------------------------------------------------------------------ No results for input(s): TSH, T4TOTAL, T3FREE, THYROIDAB in the last 72 hours.  Invalid input(s): FREET3 ------------------------------------------------------------------------------------------------------------------ No results for input(s): VITAMINB12, FOLATE, FERRITIN, TIBC, IRON, RETICCTPCT in the last 72 hours.  Coagulation profile  Recent Labs Lab 12/07/15 1229  INR 1.23    No results for input(s): DDIMER in the last 72 hours.  Cardiac Enzymes No results for input(s): CKMB, TROPONINI, MYOGLOBIN in the last 168 hours.  Invalid input(s): CK ------------------------------------------------------------------------------------------------------------------ Invalid input(s): POCBNP  No results for input(s): GLUCAP in the last 72 hours.   Thurnell Lose M.D. [facility] 12/11/2015, 9:23 AM  Pager: [phone] Between 7am to 7pm - call Pager - [phone]  After 7pm go to [email or web address] - password  [email or web address]  Call night coverage person covering after 7pm

## 2015-12-11 NOTE — Progress Notes (Signed)
  [facility] Progress Note   Subjective: no complaints, making good amounts of urine on milrinone drip  Filed Vitals:   12/11/15 0157 12/11/15 0302 12/11/15 0719 12/11/15 1151  BP:   108/48 110/61  Pulse: 72 70 65 65  Temp:  97.5 F (36.4 C) 97.4 F (36.3 C) 95.9 F (35.5 C)  TempSrc:  Oral Axillary Axillary  Resp: 15 12 10 7   Height:      Weight: 54.1 kg (119 lb 4.3 oz)     SpO2: 100% 100% 99% 97%    Inpatient medications: . aspirin EC  81 mg Oral Daily  . clonazePAM  1 mg Oral QHS  . docusate sodium  200 mg Oral BID  . ENSURE  237 mL Oral TID WC  . estrogens (conjugated)  1.25 mg Oral Daily  . ezetimibe  10 mg Oral Daily  . feeding supplement (PRO-STAT SUGAR FREE 64)  30 mL Oral TID WC  . ferrous sulfate  325 mg Oral Q breakfast  . furosemide  80 mg Intravenous BID  . heparin subcutaneous  5,000 Units Subcutaneous 3 times per day  . metoprolol succinate  50 mg Oral Daily  . pantoprazole  40 mg Oral BID  . polyethylene glycol  17 g Oral BID  . potassium chloride  10 mEq Intravenous Q1 Hr x 6  . potassium chloride  40 mEq Oral Q4H  . sodium chloride flush  10-40 mL Intracatheter Q12H   . milrinone 0.25 mcg/kg/min (12/11/15 1152)   acetaminophen **OR** [DISCONTINUED] acetaminophen, albuterol, hydrALAZINE, HYDROcodone-acetaminophen, HYDROmorphone (DILAUDID) injection, ipratropium-albuterol, meclizine, metoprolol, nitroGLYCERIN, ondansetron, phenol, sodium chloride flush  Exam: Thin frail older adult female, sedated after dilaudid  No jvd or bruits Chest bibasilar crackles scattered, no wheezing RRR no MRG Abd soft ntnd no mass or ascites +bs GU defer MS no joint effusions or deformity R groin wound healing well, no bruits Ext 1+ bilat pretib edema / no wounds or ulcers Neuro is lethargic, arousable, nonfocal  UA tntc wbcf, +yeast, tntc epi, 6-30 rbc, mod Hb, amber color, turbid on 3/22 CXR +vasc congestion 3/25, early CHF  Assessment: 1 Acute  on CKD stage 3 - consistent with cardiorenal syndrome. Doing getter on milrinone and low dose IV lasix 80 bid.  Creat down 1.6 today.  Not dialysis candidate due to severe comorbidities. No further suggestions. Will sign off.  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 repair R fem PA 4 HTN on MTP only now 5 CAD hx CABG 6 COPD  Plan - as above   Kelly Splinter MD [facility] pager [phone]    cell [phone] 12/11/2015, 12:51 PM    Recent Labs Lab 12/09/15 0350 12/10/15 0316 12/11/15 0300  NA 143 142 144  K 3.3* 3.0* 2.2*  CL 112* 110 116*  CO2 21* 21* 21*  GLUCOSE 106* 93 98  BUN 34* 32* 27*  CREATININE 2.19* 2.24* 1.64*  CALCIUM 8.5* 8.4* 6.7*    Recent Labs Lab 12/09/15 0350  AST 11*  ALT 6*  ALKPHOS 62  BILITOT 1.1  PROT 4.8*  ALBUMIN 1.8*    Recent Labs Lab 12/09/15 0350 12/10/15 0316 12/11/15 0300  WBC 22.4* 22.5* 24.7*  HGB 9.3* 9.4* 8.8*  HCT 29.7* 28.6* 28.1*  MCV 95.2 94.4 94.6  PLT 131* 123* 123*

## 2015-12-12 ENCOUNTER — Inpatient Hospital Stay (HOSPITAL_COMMUNITY): Payer: Commercial Managed Care - HMO

## 2015-12-12 DIAGNOSIS — N179 Acute kidney failure, unspecified: Secondary | ICD-10-CM | POA: Insufficient documentation

## 2015-12-12 LAB — CBC
HCT: 28.3 % — ABNORMAL LOW (ref 36.0–46.0)
HEMOGLOBIN: 9.1 g/dL — AB (ref 12.0–15.0)
MCH: 31.2 pg (ref 26.0–34.0)
MCHC: 32.2 g/dL (ref 30.0–36.0)
MCV: 96.9 fL (ref 78.0–100.0)
PLATELETS: 140 10*3/uL — AB (ref 150–400)
RBC: 2.92 MIL/uL — AB (ref 3.87–5.11)
RDW: 17.5 % — ABNORMAL HIGH (ref 11.5–15.5)
WBC: 28.5 10*3/uL — ABNORMAL HIGH (ref 4.0–10.5)

## 2015-12-12 LAB — BASIC METABOLIC PANEL
ANION GAP: 8 (ref 5–15)
Anion gap: 9 (ref 5–15)
BUN: 36 mg/dL — ABNORMAL HIGH (ref 6–20)
BUN: 37 mg/dL — ABNORMAL HIGH (ref 6–20)
CALCIUM: 8.8 mg/dL — AB (ref 8.9–10.3)
CALCIUM: 8.8 mg/dL — AB (ref 8.9–10.3)
CHLORIDE: 107 mmol/L (ref 101–111)
CO2: 25 mmol/L (ref 22–32)
CO2: 26 mmol/L (ref 22–32)
CREATININE: 2.04 mg/dL — AB (ref 0.44–1.00)
CREATININE: 2.13 mg/dL — AB (ref 0.44–1.00)
Chloride: 107 mmol/L (ref 101–111)
GFR calc non Af Amer: 23 mL/min — ABNORMAL LOW (ref >60)
GFR, EST AFRICAN AMERICAN: 27 mL/min — AB (ref >60)
GFR, EST AFRICAN AMERICAN: 26 mL/min — AB (ref >60)
GFR, EST NON AFRICAN AMERICAN: 24 mL/min — AB (ref >60)
GLUCOSE: 88 mg/dL (ref 65–99)
Glucose, Bld: 86 mg/dL (ref 65–99)
Potassium: 5.3 mmol/L — ABNORMAL HIGH (ref 3.5–5.1)
Potassium: 5.3 mmol/L — ABNORMAL HIGH (ref 3.5–5.1)
Sodium: 140 mmol/L (ref 135–145)
Sodium: 142 mmol/L (ref 135–145)

## 2015-12-12 LAB — BRAIN NATRIURETIC PEPTIDE: B NATRIURETIC PEPTIDE 5: 975.6 pg/mL — AB (ref 0.0–100.0)

## 2015-12-12 LAB — CULTURE, BLOOD (ROUTINE X 2): Culture: NO GROWTH

## 2015-12-12 MED ORDER — SODIUM POLYSTYRENE SULFONATE 15 GM/60ML PO SUSP: 15.0000 g | Freq: Once | ORAL | Status: DC

## 2015-12-12 MED ORDER — SORBITOL 70 % SOLN
960.0000 mL | TOPICAL_OIL | Freq: Once | ORAL | Status: DC
  Filled 2015-12-12: qty 240

## 2015-12-12 MED ORDER — SODIUM CHLORIDE 0.9 % IV SOLN
0.2500 mg/h | INTRAVENOUS | Status: DC
  Administered 2015-12-12: 0.25 mg/h via INTRAVENOUS
  Filled 2015-12-12: qty 2.5

## 2015-12-12 MED ORDER — SODIUM CHLORIDE 0.9% FLUSH
10.0000 mL | INTRAVENOUS | Status: DC | PRN
  Administered 2015-12-12 – 2015-12-13 (×2): 10 mL
  Filled 2015-12-12: qty 40

## 2015-12-12 MED ORDER — HYDROMORPHONE BOLUS VIA INFUSION
0.5000 mg | INTRAVENOUS | Status: DC | PRN
  Administered 2015-12-13 (×2): 0.5 mg via INTRAVENOUS
  Filled 2015-12-12 (×3): qty 1

## 2015-12-12 MED ORDER — FUROSEMIDE 10 MG/ML IJ SOLN
80.0000 mg | Freq: Two times a day (BID) | INTRAMUSCULAR | Status: DC
  Filled 2015-12-12: qty 8

## 2015-12-12 MED ORDER — LORAZEPAM 2 MG/ML IJ SOLN
0.5000 mg | Freq: Four times a day (QID) | INTRAMUSCULAR | Status: DC | PRN
  Administered 2015-12-13: 0.5 mg via INTRAVENOUS
  Filled 2015-12-12: qty 1

## 2015-12-12 MED ORDER — HYDROMORPHONE HCL 1 MG/ML IJ SOLN
2.0000 mg | INTRAMUSCULAR | Status: DC | PRN
  Administered 2015-12-12: 1 mg via INTRAVENOUS

## 2015-12-12 MED ORDER — ACETAMINOPHEN 650 MG RE SUPP: 325.0000 mg | Freq: Four times a day (QID) | RECTAL | Status: DC | PRN

## 2015-12-12 MED ORDER — HYDROMORPHONE HCL 1 MG/ML IJ SOLN: 0.5000 mg | INTRAMUSCULAR | Status: DC

## 2015-12-12 NOTE — Progress Notes (Signed)
Report given to [place], Therapist, sports. Pt being tranfered to 5W10

## 2015-12-12 NOTE — Progress Notes (Signed)
Subjective:  No chest pain; moaning   Objective:   Vital Signs : Filed Vitals:   12/12/15 0326 12/12/15 0338 12/12/15 0400 12/12/15 0739  BP: 116/52  116/59 102/49  Pulse: 75  75 92  Temp:   97.5 F (36.4 C) 97.3 F (36.3 C)  TempSrc:   Axillary Axillary  Resp: _0 Height:      Weight:  119 lb 7.8 oz (54.2 kg)    SpO2: 98%  96% 98%    Intake/Output from previous day:  Intake/Output Summary (Last 24 hours) at 12/12/15 0845 Last data filed at 12/12/15 0600  Gross per 24 hour  Intake  672.4 ml  Output   1500 ml  Net -827.6 ml    I/O since admission: +[id number]  Wt Readings from Last 3 Encounters:  12/12/15 119 lb 7.8 oz (54.2 kg)  09/27/15 106 lb 9.6 oz (48.353 kg)  09/14/15 106 lb (48.081 kg)    Medications: . aspirin EC  81 mg Oral Daily  . clonazePAM  1 mg Oral QHS  . docusate sodium  200 mg Oral BID  . ENSURE  237 mL Oral TID WC  . estrogens (conjugated)  1.25 mg Oral Daily  . ezetimibe  10 mg Oral Daily  . feeding supplement (PRO-STAT SUGAR FREE 64)  30 mL Oral TID WC  . ferrous sulfate  325 mg Oral Q breakfast  . furosemide  80 mg Intravenous BID  . heparin subcutaneous  5,000 Units Subcutaneous 3 times per day  . metoprolol succinate  50 mg Oral Daily  . pantoprazole  40 mg Oral BID  . polyethylene glycol  17 g Oral BID  . sodium polystyrene  15 g Oral Once  . sorbitol, milk of mag, mineral oil, glycerin (SMOG) enema  960 mL Rectal Once    . milrinone 0.25 mcg/kg/min (12/11/15 2000)    Physical Exam:   General appearance: sedated but moaning; responds Neck: no adenopathy, no JVD, supple, symmetrical, trachea midline and thyroid not enlarged, symmetric, no tenderness/mass/nodules Lungs: diffuse rhonchi Heart: regular rate and rhythm Abdomen: soft, non-tender; bowel sounds normal; no masses,  no organomegaly Extremities: ecchymosis with hematoma and bandage R groin Skin: Skin color, texture, turgor normal. No rashes or lesions or ecchymotic R  groin Neurologic: moves all extremities   Rate: 88  Rhythm: normal sinus rhythm  ECG (independently read by me): last done on 3/15 with Sinus bradycardia at 55 aqnd T wave inversion laterally   Lab Results:   Recent Labs  12/10/15 0316 12/11/15 0300 12/11/15 1654 12/12/15 0334  NA 142 144  --  140  K 3.0* 2.2* 5.1 5.3*  CL 110 116*  --  107  CO2 21* 21*  --  25  GLUCOSE 93 98  --  86  BUN 32* 27*  --  36*  CREATININE 2.24* 1.64*  --  2.04*  CALCIUM 8.4* 6.7*  --  8.8*  MG  --  1.3* 2.8*  --     Hepatic Function Latest Ref Rng 12/09/2015 11/16/2015 06/28/2013  Total Protein 6.5 - 8.1 g/dL 4.8(L) 6.7 6.2  Albumin 3.5 - 5.0 g/dL 1.8(L) 3.4(L) 2.7(L)  AST 15 - 41 U/L 11(L) 12(L) 22  ALT 14 - 54 U/L 6(L) <5(L) 32  Alk Phosphatase 38 - 126 U/L 62 95 133(H)  Total Bilirubin 0.3 - 1.2 mg/dL 1.1 0.3 0.2(L)     Recent Labs  12/10/15 0316 12/11/15 0300 12/12/15 0334  WBC 22.5*  24.7* 28.5*  HGB 9.4* 8.8* 9.1*  HCT 28.6* 28.1* 28.3*  MCV 94.4 94.6 96.9  PLT 123* 123* 140*    No results for input(s): TROPONINI in the last 72 hours.  Invalid input(s): CK, MB  Lab Results  Component Value Date   TSH 2.483 11/23/2011   No results for input(s): HGBA1C in the last 72 hours.  No results for input(s): PROT, ALBUMIN, AST, ALT, ALKPHOS, BILITOT, BILIDIR, IBILI in the last 72 hours. No results for input(s): INR in the last 72 hours. BNP (last 3 results)  Recent Labs  12/08/15 1030 12/10/15 0844  BNP 3698.6* 2104.9*    ProBNP (last 3 results) No results for input(s): PROBNP in the last 8760 hours.   Lipid Panel     Component Value Date/Time   CHOL  10/19/2008 1251    170        ATP III CLASSIFICATION:  <200     mg/dL   Desirable  200-239  mg/dL   Borderline High  >=240    mg/dL   High          TRIG 100 10/19/2008 1251   HDL 43 10/19/2008 1251   CHOLHDL 4.0 10/19/2008 1251   VLDL 20 10/19/2008 1251   LDLCALC * 10/19/2008 1251    107        Total  Cholesterol/HDL:CHD Risk Coronary Heart Disease Risk Table                     Men   Women  1/2 Average Risk   3.4   3.3  Average Risk       5.0   4.4  2 X Average Risk   9.6   7.1  3 X Average Risk  23.4   11.0        Use the calculated Patient Ratio above and the CHD Risk Table to determine the patient's CHD Risk.        ATP III CLASSIFICATION (LDL):  <100     mg/dL   Optimal  100-129  mg/dL   Near or Above                    Optimal  130-159  mg/dL   Borderline  160-189  mg/dL   High  >190     mg/dL   Very High      Imaging:  US Renal  12/10/2015  CLINICAL DATA:  Acute renal failure EXAM: RENAL / URINARY TRACT ULTRASOUND COMPLETE COMPARISON:  CT abdomen pelvis dated 12/08/2015 FINDINGS: Right Kidney: Length: 9.3 cm.  Poorly visualized.  No mass or hydronephrosis. Left Kidney: Length: 8.0 cm.  9 mm upper pole renal calculus.  No hydronephrosis. Bladder: Within normal limits. IMPRESSION: 9 mm left upper pole renal calculus. No hydronephrosis. Electronically Signed   By: Julian Hy M.D.   On: 12/10/2015 17:09   Dg Chest Port 1 View  12/12/2015  CLINICAL DATA:  Patient with recent onset shortness of breath. EXAM: PORTABLE CHEST 1 VIEW COMPARISON:  Chest radiograph 12/10/2015 FINDINGS: New right upper extremity PICC line is present with tip projecting over the superior cavoatrial junction. Multiple monitoring leads overlie the patient. Single lead AICD device overlies the left hemi thorax, lead is stable in position. Stable cardiac and mediastinal contours. Minimal heterogeneous opacities left lung base. No pleural effusion or pneumothorax. IMPRESSION: New right upper extremity PICC line is present tip projecting over the superior cavoatrial junction. Heterogeneous opacities left lung base  favored to represent atelectasis. Infection not excluded. Electronically Signed   By: Lovey Newcomer M.D.   On: 12/12/2015 08:07   Dg Abd Portable 1v  12/12/2015  CLINICAL DATA:  Abdominal pain  EXAM: PORTABLE ABDOMEN - 1 VIEW COMPARISON:  None. FINDINGS: Scattered large and small bowel gas is noted. Contrast material is noted throughout the colon consistent with the recent CT examination. No obstructive changes or free air are noted. Bony structures are within normal limits. Left common iliac artery stent is seen. IMPRESSION: No acute abnormality noted. Electronically Signed   By: Inez Catalina M.D.   On: 12/12/2015 08:36      Assessment/Plan:   Principal Problem:   AKI (acute kidney injury) ([place]) Active Problems:   ANEMIA NEC   Essential hypertension   CKD (chronic kidney disease)   False aneurysm (HCC)   Pseudoaneurysm of right femoral artery (HCC)   Ischemic chest pain (HCC)   Coronary artery disease due to lipid rich plaque   Cardiomyopathy, ischemic   Leukocytosis   HCAP (healthcare-associated pneumonia)   Physical deconditioning   Abdominal pain   Ascites   Mesenteric vascular insufficiency, chronic (HCC)   Acute on chronic systolic heart failure ([place])  1. Acute on chronic systolic/diastolic heart failure: EF 20 - 25%; on milrinone and lasix 2. Acute on chronic renal failure with cardiorenal syndrome improved with milrinone;  Cr 2.24-->1.64 -->2.04 today;  Had good diuresis yesterday.  BNP 2104 on 3/25 3. CAD with known occluded RCA and prior LAD intervention with severe ICCM s/p ICD 4. PVD s/p fem-pop bypass and R femoral aneurysm repair. 5. Ischemic cardiomyopathy, s/p ICD  Will repeat ECG today.  With Cr inc will re-check BNP; f/u  BMET Troy Sine, MD, [facility] 12/12/2015, 8:45 AM

## 2015-12-12 NOTE — Consult Note (Signed)
Goals of care are clear with family at bedside for full comfort measures with symptom management.  Milrinone has been d/c.  Primary RN reports moaning and restlessness this shift requiring IV Dilaudid.  Will discuss with Dr. Hilma Favors and adjust medications as ordered.  Dilaudid gtt discussed with family goals and intentions of intervention are to relieve suffering.  Verbalized understanding.  Referral for [facility] was requested by CSW.  [place] liaison at bedside currently.  Goal is to promote comfort and provide support for family.  Transition plans are in progress.  Symptom management interventions are in place.    Will follow as needed in consult with Dr. Hilma Favors.  Kizzie Fantasia, RN-BC, MSN, [facility]

## 2015-12-12 NOTE — Progress Notes (Signed)
Patient ID: [name], female   DOB: [birth year], 70 y.o.   MRN: [id number] Talked with patient's husband and examined patient today Agree with plans for hospice care Multiple medical problems and now is deteriorating with ischemic cardiomyopathy and pulmonary edema Right inguinal incision is healing satisfactorily and right leg is stable  Patient to be evaluated by hospice service today

## 2015-12-12 NOTE — Progress Notes (Signed)
[facility] progress note                                                                               Patient Demographics  [name], is a 70 y.o. female, DOB - June 20, 1946, [id number]  Admit date - 11/18/2015   Admitting Physician Waldemar Dickens, MD  Outpatient Primary MD for the patient is Shirline Frees, MD  LOS - 12  days    Chief Complaint  Patient presents with  . Groin Pain  . Back Pain       Brief HPI   70 year old Female with CAD, chronic systolic heart failure, anemia, ASCVD, tobacco use, CHF . Admitted by vascular with severe PAD and underwent right femoral pseudoaneurysm expansion on March15th. Since that time she's had slow progression. Her chronic kidney disease slightly worsening as well as her chronic IDA, had transfusion on 3/20. Patient complained of abdominal pain. IM service consulted for medical management, worsening of anemia, renal function, HCAP.   Family now wants DNR and Hospice, will arrange.   Subjective:   Patient in bed, husband bedside, denies any headache, no fever or chills, no chest pain or cough, no shortness of breath or abdominal pain. No focal weakness.   Assessment & Plan    Acute on chronic kidney injury.  Baseline around 1.3 She Developed cardiorenal syndrome, cardiology and nephrology were consulted, she underwent PICC line placement on 12/10/2015 and combination of milrinone and Lasix was started, her renal function has since improved. She also had an stable renal ultrasound.  Cardiomyopathy ischemic/ acute on chronic systolic and diastolic heart failure. Chart review indicates echo 2013 with an EF of 25-30% - she Developed cardiorenal syndrome this admission, underwent PICC line placement on 12/10/2015 by cardiology after which milrinone was started with good effect, renal function has improved, however patient looks like a poor long-term candidate for inotropes at home. Continue low-dose beta blocker. No  ACE/ARB due to renal failure.   Family now wants DNR and Hospice, will arrange. Switch off defibrillator. Likely DC milrinone and Tele as well.    Leukocytosis. Blood culture 1/2 coagulase negative staph  likely contaminant  Initially treated for pneumonia, also had evidence of UTI, blood cultures 1 out of 2 coag-negative staph which is contamination. She is afebrile. No cough or shortness of breath, no oxygen demand. Right groin site appears stable. I do not think clinically she has pneumonia. She has been treated with vancomycin and Zosyn for at least 4 days, we'll stop all antibiotics and monitor clinically. Encourage the patient to sit up in the chair, use incentive spirometer and flutter valve both of which have been added. She does have atelectasis and at risk for developing pneumonia.   History of iron deficiency anemia and anemia of chronic disease - transfusion of packed RBC on 3/20,  H&H currently stable    Abdominal pain/constipation, hypoalbuminemia (1.8)- Colitis per recent CT abdomen on 3/18 versus ileus. GI following. Per GI patient has evidence of chronic bowel ischemia, no acute etiologies, abdominal exam is benign, no diarrhea, will monitor. Bowel regimen for constipation. Does have some anasarca and third spacing of  fluid. We will add protein supplementation and monitor. SMOG enema on 12-12-15, already on B regimen, KUB stable.  Hypertension. Controlled on beta blocker.    Deconditioning  The patient is currently very debilitated and deconditioned, needs physical therapy and mobilization.   Recent right femoral artery aneurysm surgery management per vascular surgery which was the primary team. Case transferred to hospitalist service on 12/09/2015. Vascular surgery will continue to follow.   Hypokalemia and hypomagnesemia. Replaced will monitor.  Incidental 9 mm left upper pole renal calculus. Outpt follow.  Generalized deconditioning, extremely frail status, appears to have  severe protein calorie malnutrition. Place her protein supplementation, family now wants DO NOT RESUSCITATE. We'll request palliative care to evaluate as well for goals of care.    Family Communication: Discussed with husband in detail on 12/10/2015.   Disposition Plan:   Hospice soon  Code status DO NOT RESUSCITATE.  Consults. Cardiology, renal, vascular surgery  DVT prophylaxis. Will start heparin on 12/11/2015   Time Spent in minutes  25 minutes   Medications  Scheduled Meds: . aspirin EC  81 mg Oral Daily  . clonazePAM  1 mg Oral QHS  . docusate sodium  200 mg Oral BID  . ENSURE  237 mL Oral TID WC  . estrogens (conjugated)  1.25 mg Oral Daily  . ezetimibe  10 mg Oral Daily  . feeding supplement (PRO-STAT SUGAR FREE 64)  30 mL Oral TID WC  . ferrous sulfate  325 mg Oral Q breakfast  . furosemide  80 mg Intravenous BID  . heparin subcutaneous  5,000 Units Subcutaneous 3 times per day  . metoprolol succinate  50 mg Oral Daily  . pantoprazole  40 mg Oral BID  . polyethylene glycol  17 g Oral BID  . sodium polystyrene  15 g Oral Once  . sorbitol, milk of mag, mineral oil, glycerin (SMOG) enema  960 mL Rectal Once   Continuous Infusions: . milrinone 0.25 mcg/kg/min (12/11/15 2000)   PRN Meds:.acetaminophen **OR** [DISCONTINUED] acetaminophen, albuterol, hydrALAZINE, HYDROcodone-acetaminophen, HYDROmorphone (DILAUDID) injection, ipratropium-albuterol, meclizine, metoprolol, nitroGLYCERIN, ondansetron, phenol   Antibiotics   Anti-infectives    Start     Dose/Rate Route Frequency Ordered Stop   12/09/15 1500  vancomycin (VANCOCIN) IVPB 750 mg/150 ml premix  Status:  Discontinued     750 mg 150 mL/hr over 60 Minutes Intravenous Every 48 hours 12/07/15 1435 12/10/15 1027   12/08/15 1800  piperacillin-tazobactam (ZOSYN) IVPB 2.25 g  Status:  Discontinued     2.25 g 100 mL/hr over 30 Minutes Intravenous 4 times per day 12/08/15 1059 12/10/15 1027   12/08/15 1200   piperacillin-tazobactam (ZOSYN) IVPB 3.375 g     3.375 g 100 mL/hr over 30 Minutes Intravenous  Once 12/08/15 1059 12/08/15 1245   12/07/15 1500  vancomycin (VANCOCIN) IVPB 1000 mg/200 mL premix     1,000 mg 200 mL/hr over 60 Minutes Intravenous  Once 12/07/15 1435 12/07/15 1806   12/07/15 1500  ceFEPIme (MAXIPIME) 1 g in dextrose 5 % 50 mL IVPB  Status:  Discontinued     1 g 100 mL/hr over 30 Minutes Intravenous Every 24 hours 12/07/15 1435 12/08/15 1026   12/07/15 1400  ceFEPIme (MAXIPIME) 1 g in dextrose 5 % 50 mL IVPB  Status:  Discontinued     1 g 100 mL/hr over 30 Minutes Intravenous Every 24 hours 12/07/15 1339 12/07/15 1435   12/03/15 1000  piperacillin-tazobactam (ZOSYN) IVPB 3.375 g  Status:  Discontinued  3.375 g 12.5 mL/hr over 240 Minutes Intravenous Every 8 hours 12/03/15 0944 12/07/15 1339   12/01/15 0400  cefUROXime (ZINACEF) 1.5 g in dextrose 5 % 50 mL IVPB     1.5 g 100 mL/hr over 30 Minutes Intravenous Every 12 hours 12/01/15 0057 12/01/15 1742        Objective:   Filed Vitals:   12/12/15 0326 12/12/15 0338 12/12/15 0400 12/12/15 0739  BP: 116/52  116/59 102/49  Pulse: 75  75 92  Temp:   97.5 F (36.4 C) 97.3 F (36.3 C)  TempSrc:   Axillary Axillary  Resp: 10  13 18   Height:      Weight:  54.2 kg (119 lb 7.8 oz)    SpO2: 98%  96% 98%    Intake/Output Summary (Last 24 hours) at 12/12/15 0842 Last data filed at 12/12/15 0600  Gross per 24 hour  Intake  672.4 ml  Output   1500 ml  Net -827.6 ml     Wt Readings from Last 3 Encounters:  12/12/15 54.2 kg (119 lb 7.8 oz)  09/27/15 48.353 kg (106 lb 9.6 oz)  09/14/15 48.081 kg (106 lb)     Exam  General: Alert and oriented, appears ill  HEENT:    Neck:   CVS: S1clear, regular rate and rhythm.  Respiratory: diffuse coarse breath sounds   Abdomen: Soft,  mild diffuse tenderness although improving from yesterday, + bowel sounds  Ext: no cyanosis clubbing,  RLE edema 1+, left lower  extremity no edema, right groin dressing intact   Neuro: able to lift up her legs, strength 5/5 upper extremities  Skin: No rashes  Psych: much more alert today   Data Reviewed:  I have personally reviewed following labs and imaging studies  Micro Results Recent Results (from the past 240 hour(s))  Culture, blood (Routine X 2) w Reflex to ID Panel     Status: None   Collection Time: 12/02/15  3:51 PM  Result Value Ref Range Status   Specimen Description BLOOD RIGHT ANTECUBITAL  Final   Special Requests BOTTLES DRAWN AEROBIC AND ANAEROBIC 5CC  Final   Culture NO GROWTH 5 DAYS  Final   Report Status 12/07/2015 FINAL  Final  Culture, blood (Routine X 2) w Reflex to ID Panel     Status: None   Collection Time: 12/02/15  3:55 PM  Result Value Ref Range Status   Specimen Description BLOOD RIGHT HAND  Final   Special Requests IN PEDIATRIC BOTTLE 2CC  Final   Culture NO GROWTH 5 DAYS  Final   Report Status 12/07/2015 FINAL  Final  Urine culture     Status: None   Collection Time: 12/07/15  2:06 PM  Result Value Ref Range Status   Specimen Description URINE, RANDOM  Final   Special Requests ADDED [id number]  Final   Culture >=100,000 COLONIES/mL YEAST  Final   Report Status 12/09/2015 FINAL  Final  Culture, blood (routine x 2) Call MD if unable to obtain prior to antibiotics being given     Status: None (Preliminary result)   Collection Time: 12/07/15  3:00 PM  Result Value Ref Range Status   Specimen Description BLOOD LEFT HAND  Final   Special Requests IN PEDIATRIC BOTTLE 1CC  Final   Culture NO GROWTH 4 DAYS  Final   Report Status PENDING  Incomplete  Culture, blood (routine x 2) Call MD if unable to obtain prior to antibiotics being given  Status: None   Collection Time: 12/07/15  3:10 PM  Result Value Ref Range Status   Specimen Description BLOOD RIGHT HAND  Final   Special Requests IN PEDIATRIC BOTTLE 1CC  Final   Culture  Setup Time   Final    GRAM POSITIVE COCCI IN  CLUSTERS AEROBIC BOTTLE ONLY CRITICAL RESULT CALLED TO, READ BACK BY AND VERIFIED WITH: A [place] 12/08/15 @ 49 M VESTAL    Culture   Final    STAPHYLOCOCCUS SPECIES (COAGULASE NEGATIVE) THE SIGNIFICANCE OF ISOLATING THIS ORGANISM FROM A SINGLE SET OF BLOOD CULTURES WHEN MULTIPLE SETS ARE DRAWN IS UNCERTAIN. PLEASE NOTIFY THE MICROBIOLOGY DEPARTMENT WITHIN ONE WEEK IF SPECIATION AND SENSITIVITIES ARE REQUIRED.    Report Status 12/10/2015 FINAL  Final  MRSA PCR Screening     Status: None   Collection Time: 12/10/15  1:08 PM  Result Value Ref Range Status   MRSA by PCR NEGATIVE NEGATIVE Final    Comment:        The GeneXpert MRSA Assay (FDA approved for NASAL specimens only), is one component of a comprehensive MRSA colonization surveillance program. It is not intended to diagnose MRSA infection nor to guide or monitor treatment for MRSA infections.     Radiology Reports Ct Abdomen Pelvis Wo Contrast  12/08/2015  CLINICAL DATA:  Diffuse abdominal pain for 1 day. EXAM: CT ABDOMEN AND PELVIS WITHOUT CONTRAST TECHNIQUE: Multidetector CT imaging of the abdomen and pelvis was performed following the standard protocol without IV contrast. COMPARISON:  12/03/2015. FINDINGS: Lower chest: Persistent and slightly larger bilateral pleural effusions and overlying atelectasis or infiltrates. The heart is enlarged. Stable aortic and coronary artery calcifications. Hepatobiliary: No focal hepatic lesions or intrahepatic biliary dilatation. The gallbladder is surgically absent. Pancreas: No mass, inflammation or ductal dilatation. Spleen: Normal size.  No focal lesions. Adrenals/Urinary Tract: Small left kidney is stable. Left renal calculus is unchanged. No hydroureteronephrosis. Stomach/Bowel: The stomach is displaced superiorly and laterally by a a large amount of fluid in the lesser sac. There is also increasing abdominal and pelvic ascites. The duodenum, small bowel and colon are grossly normal. No  inflammatory changes, mass lesions or obstructive findings. No leaking oral contrast or free air. Vascular/Lymphatic: Stable tortuous, ectatic and calcified abdominal aorta. No mesenteric or retroperitoneal mass or adenopathy. Stable surgical changes from an right axillofemoral and fem-fem bypass graft. 5.0 x 2.3 cm hematoma noted near the right femoral graft. Other: Worsening diffuse body wall edema suggesting anasarca. The bladder is mildly distended. It does contain air but this could be from recent catheterization. Increasing free pelvic fluid. Musculoskeletal: No significant/acute osseous findings. IMPRESSION: 1. Enlarging bilateral pleural effusions and bibasilar atelectasis/infiltrates. 2. Increasing abdominal and pelvic fluid. There is a significant amount of fluid in the lesser sac which has mass effect on the stomach. No findings for perforation. 3. Worsening diffuse body wall edema suggesting anasarca. 4. Stable surgical changes from a right axillofemoral bypass graft and fem-fem bypass graft. Resolving hematoma in the right upper groin area. Electronically Signed   By: Marijo Sanes M.D.   On: 12/08/2015 17:18   Ct Abdomen Pelvis Wo Contrast  12/03/2015  CLINICAL DATA:  Status post axillofemoral bypass with elevated white blood cell count. EXAM: CT ABDOMEN AND PELVIS WITHOUT CONTRAST TECHNIQUE: Multidetector CT imaging of the abdomen and pelvis was performed following the standard protocol without IV contrast. COMPARISON:  November 30, 2015 FINDINGS: New bilateral effusions, left greater than right. Underlying opacity is likely atelectasis. Cardiomegaly persists. No  other abnormalities identified in the lung bases. No free air. There is increased attenuation in the subcutaneous fat consistent with volume overload. There is also ascites within the abdomen, particularly the left paracolic gutter, and in the pelvis. The left-sided predominance of ascites could be due to the fact the patient is tilted to the  left. Patient is status post cholecystectomy. The liver, spleen, and right adrenal gland are normal. Prominence of the left adrenal gland is likely due to hyperplasia the unchanged. A nonobstructing left renal stone is again identified. Right kidney is unchanged and unremarkable. Pancreas demonstrates no abnormalities. 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 Atherosclerosis. No adenopathy. The stomach is normal. A few mildly prominent loops of proximal small-bowel are contrast filled and thin walled measuring up to 3 cm. Contrast does extend all the way to the colon. The descending colon is decompressed. The descending colon does demonstrate apparent wall thickening. No pericolonic fat stranding in regions without ascites. The more proximal and well-distended colon is normal. The appendix is not seen but there is no secondary evidence of appendicitis. The pelvis demonstrates previous hysterectomy. The bladder is partially decompressed with a Foley catheter. No adenopathy. There is hematoma surrounding the surgical site in the right groin. Patency and integrity of the graft cannot be assessed on this study due to lack of contrast. However, previously seen aneurysm is no longer visualized. Visualized bones are unremarkable. IMPRESSION: 1. Postoperative changes in the right groin with surrounding hematoma. The hematoma measures 3.6 by 2.9 cm on axial image 82 and 8.5 cm in cranial caudal dimension on image 24. The small amount of adjacent air in the soft tissues may all be postsurgical. 2. Wall thickening in the descending colon could be due to poor distention, as suggested by lack of pericolonic stranding in sites without ascites. However, given fever, colitis, is not completely excluded. Recommend clinical correlation. 3. Fluid overload with ascites and abdominal wall edema. Electronically Signed   By: Dorise Bullion III M.D   On: 12/03/2015 13:26   Ct  Abdomen Pelvis Wo Contrast  11/21/2015  CLINICAL DATA:  Right groin pain and swelling, initial encounter EXAM: CT ABDOMEN AND PELVIS WITHOUT CONTRAST TECHNIQUE: Multidetector CT imaging of the abdomen and pelvis was performed following the standard protocol without IV contrast. COMPARISON:  07/09/2007 FINDINGS: Lung bases are free of acute infiltrate or sizable effusion. The gallbladder has been surgically removed. The liver, spleen, adrenal glands and pancreas are within normal limits. The kidneys are well visualized without renal calculi. A cortical calcification is noted in the left kidney. The abdominal aorta is dilated to 3.1 cm. There are changes consistent with a right axillobifemoral bypass graft. 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 The actual degree and patency within the aneurysmal dilatation cannot be assessed on this exam due to the lack of IV contrast. The femoral crossover graft appears within normal limits. The bladder is well distended. No pelvic mass lesion or sidewall abnormality is noted. The appendix is not well visualized. The osseous structures are within normal limits. 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 The degree of patency of the aneurysmal dilatation is uncertain as no contrast was administered for this exam. Ultrasound may be helpful for further evaluation. Vascular surgery consultation recommended. Dilatation of the abdominal aorta to 3 cm. Recommend  followup by ultrasound in 3 years. This recommendation follows ACR consensus guidelines: White Paper of the ACR Incidental Findings Committee II on Vascular Findings. Natasha Mead Coll Radiol 2013; 10:789-794 Electronically Signed   By: Inez Catalina M.D.   On: 11/21/2015 19:38   US Renal  12/10/2015  CLINICAL DATA:  Acute renal failure EXAM: RENAL / URINARY TRACT  ULTRASOUND COMPLETE COMPARISON:  CT abdomen pelvis dated 12/08/2015 FINDINGS: Right Kidney: Length: 9.3 cm.  Poorly visualized.  No mass or hydronephrosis. Left Kidney: Length: 8.0 cm.  9 mm upper pole renal calculus.  No hydronephrosis. Bladder: Within normal limits. IMPRESSION: 9 mm left upper pole renal calculus. No hydronephrosis. Electronically Signed   By: Julian Hy M.D.   On: 12/10/2015 17:09   Dg Chest Port 1 View  12/12/2015  CLINICAL DATA:  Patient with recent onset shortness of breath. EXAM: PORTABLE CHEST 1 VIEW COMPARISON:  Chest radiograph 12/10/2015 FINDINGS: New right upper extremity PICC line is present with tip projecting over the superior cavoatrial junction. Multiple monitoring leads overlie the patient. Single lead AICD device overlies the left hemi thorax, lead is stable in position. Stable cardiac and mediastinal contours. Minimal heterogeneous opacities left lung base. No pleural effusion or pneumothorax. IMPRESSION: New right upper extremity PICC line is present tip projecting over the superior cavoatrial junction. Heterogeneous opacities left lung base favored to represent atelectasis. Infection not excluded. Electronically Signed   By: Lovey Newcomer M.D.   On: 12/12/2015 08:07   Dg Chest Port 1 View  12/10/2015  CLINICAL DATA:  Short of breath EXAM: PORTABLE CHEST 1 VIEW COMPARISON:  12/07/2015. FINDINGS: LEFT-sided pacer with single continuously overlies enlarged cardiac silhouette. There is chronic pulmonary arteries not changed. LEFT basilar atelectasis similar prior. Mild central venous congestion. IMPRESSION: 1. Mild central venous congestion which is mildly worsened. 2. Stable LEFT basilar atelectasis versus infiltrate Electronically Signed   By: Suzy Bouchard M.D.   On: 12/10/2015 07:35   Dg Chest Port 1 View  12/07/2015  CLINICAL DATA:  Shortness of breath with productive cough EXAM: PORTABLE CHEST 1 VIEW COMPARISON:  12/02/15 chest radiograph. FINDINGS: Stable  configuration of single lead left subclavian ICD with lead tip overlying the right ventricle. Stable cardiomediastinal silhouette with mild cardiomegaly. No pneumothorax. New small left pleural effusion. New mild pulmonary edema. Patchy right infrahilar opacity appears new. Patchy consolidation at the left lung base appears increased. IMPRESSION: 1. Mild congestive heart failure. 2. New small left pleural effusion. 3. New patchy right infrahilar opacity and increased patchy left lung base consolidation, suspicious for a combination of atelectasis, pneumonia and/or aspiration. Recommend follow-up chest imaging to resolution. Electronically Signed   By: Ilona Sorrel M.D.   On: 12/07/2015 10:35   Dg Chest Port 1 View  12/02/2015  CLINICAL DATA:  Shortness of breath. Status post surgical repair of right femoral anastomotic pseudoaneurysm. EXAM: PORTABLE CHEST 1 VIEW COMPARISON:  12/03/2015 FINDINGS: Stable appearance of ICD/pacing device. The heart size and mediastinal contours are normal. Lung volumes are normal without significant atelectasis identified. There is no evidence of pulmonary edema, consolidation, pneumothorax, nodule or pleural fluid. IMPRESSION: No active disease. Electronically Signed   By: Aletta Edouard M.D.   On: 12/02/2015 08:34   Dg Chest Portable 1 View  11/27/2015  CLINICAL DATA:  Chest pain for 1 day EXAM: PORTABLE CHEST 1 VIEW COMPARISON:  Chest x-ray dated 09/15/2013. FINDINGS: Mild cardiomegaly is stable. Left chest wall pacemaker/AICD stable in position. Atherosclerotic changes again noted at the  aortic arch. Overall cardiomediastinal silhouette is stable in size and configuration. Lungs are clear. No evidence of pneumonia. No pleural effusion. No pneumothorax seen. Osseous structures about the chest are unremarkable. IMPRESSION: Stable chest x-ray. Stable mild cardiomegaly. Lungs are clear and there is no evidence of acute cardiopulmonary abnormality. Electronically Signed   By: Franki Cabot M.D.   On: 12/12/2015 20:18   Dg Abd Portable 1v  12/12/2015  CLINICAL DATA:  Abdominal pain EXAM: PORTABLE ABDOMEN - 1 VIEW COMPARISON:  None. FINDINGS: Scattered large and small bowel gas is noted. Contrast material is noted throughout the colon consistent with the recent CT examination. No obstructive changes or free air are noted. Bony structures are within normal limits. Left common iliac artery stent is seen. IMPRESSION: No acute abnormality noted. Electronically Signed   By: Inez Catalina M.D.   On: 12/12/2015 08:36    CBC  Recent Labs Lab 12/08/15 0345 12/09/15 0350 12/10/15 0316 12/11/15 0300 12/12/15 0334  WBC 23.0* 22.4* 22.5* 24.7* 28.5*  HGB 9.3* 9.3* 9.4* 8.8* 9.1*  HCT 29.0* 29.7* 28.6* 28.1* 28.3*  PLT 141* 131* 123* 123* 140*  MCV 94.5 95.2 94.4 94.6 96.9  MCH 30.3 29.8 31.0 29.6 31.2  MCHC 32.1 31.3 32.9 31.3 32.2  RDW 17.4* 17.3* 17.5* 17.1* 17.5*    Chemistries   Recent Labs Lab 12/08/15 0345 12/09/15 0350 12/10/15 0316 12/11/15 0300 12/11/15 1654 12/12/15 0334  NA 143 143 142 144  --  140  K 3.6 3.3* 3.0* 2.2* 5.1 5.3*  CL 114* 112* 110 116*  --  107  CO2 18* 21* 21* 21*  --  25  GLUCOSE 111* 106* 93 98  --  86  BUN 30* 34* 32* 27*  --  36*  CREATININE 1.97* 2.19* 2.24* 1.64*  --  2.04*  CALCIUM 8.6* 8.5* 8.4* 6.7*  --  8.8*  MG  --   --   --  1.3* 2.8*  --   AST  --  11*  --   --   --   --   ALT  --  6*  --   --   --   --   ALKPHOS  --  62  --   --   --   --   BILITOT  --  1.1  --   --   --   --    ------------------------------------------------------------------------------------------------------------------ estimated creatinine clearance is 22.3 mL/min (by C-G formula based on Cr of 2.04). ------------------------------------------------------------------------------------------------------------------ No results for input(s): HGBA1C in the last 72  hours. ------------------------------------------------------------------------------------------------------------------ No results for input(s): CHOL, HDL, LDLCALC, TRIG, CHOLHDL, LDLDIRECT in the last 72 hours. ------------------------------------------------------------------------------------------------------------------ No results for input(s): TSH, T4TOTAL, T3FREE, THYROIDAB in the last 72 hours.  Invalid input(s): FREET3 ------------------------------------------------------------------------------------------------------------------ No results for input(s): VITAMINB12, FOLATE, FERRITIN, TIBC, IRON, RETICCTPCT in the last 72 hours.  Coagulation profile  Recent Labs Lab 12/07/15 1229  INR 1.23    No results for input(s): DDIMER in the last 72 hours.  Cardiac Enzymes No results for input(s): CKMB, TROPONINI, MYOGLOBIN in the last 168 hours.  Invalid input(s): CK ------------------------------------------------------------------------------------------------------------------ Invalid input(s): POCBNP  No results for input(s): GLUCAP in the last 72 hours.   Thurnell Lose M.D. [facility] 12/12/2015, 8:42 AM  Pager: [phone] Between 7am to 7pm - call Pager - [phone]  After 7pm go to [email or web address] - password [email or web address]  Call night coverage person covering after 7pm

## 2015-12-12 NOTE — Clinical Social Work Note (Signed)
CSW consulted for Residential Hospice placement at time of discharge. Patient currently being reviewed by residential hospice facilities.  CSW to continue to follow and assist with discharge planning needs.  Lubertha Sayres, [facility]: [phone] Surgical: [phone]

## 2015-12-12 NOTE — Progress Notes (Signed)
Nutrition Follow-up  DOCUMENTATION CODES:   Non-severe (moderate) malnutrition in context of chronic illness  INTERVENTION:  Discontinue Ensure Enlive Continue Magic Cup ice cream with meals trays Monitor goals of care  NUTRITION DIAGNOSIS:   Malnutrition related to chronic illness as evidenced by moderate depletion of body fat, moderate depletions of muscle mass.  Ongoing  GOAL:   Patient will meet greater than or equal to 90% of their needs   Unmet; ? New goal of comfort  MONITOR:   PO intake, Supplement acceptance, Labs, Weight trends, Skin  REASON FOR ASSESSMENT:   Malnutrition Screening Tool    ASSESSMENT:   70 y.o. female who presents for evaluation of pain in right inguinal area. Patient noted a "knot" in the right inguinal area were previous bypasses have been performed and this has slowly enlarged over the last several days with increasing discomfort. Pt continues to have a heavy tobacco abuse problem smoking 1 pack per day. PMH of a femoral-femoral bypass graft, bilateral infrainguinal bypass grafts, CAD, chronic systolic HF, HTN, HLD, and CKD stage III.   Per nursing notes, pt last ate at breakfast 3/25. Pt asleep at time of visit, no family present. Per initial nutrition assessment, pt strongly dislikes Ensure supplements and supplement was replaced with Magic Cup ice cream supplement; daughter brings pt food. Per MD note, family wants DNR and hospice. Palliative consult in place.   Labs: low hemoglobin, elevated potassium and magnesium  Diet Order:  Diet regular Room service appropriate?: Yes; Fluid consistency:: Thin  Skin:  Wound (see comment) (closed incision on right groin)  Last BM:  3/23  Height:   Ht Readings from Last 1 Encounters:  11/17/2015 5\' 4"  (1.626 m)    Weight:   Wt Readings from Last 1 Encounters:  12/12/15 119 lb 7.8 oz (54.2 kg)    Ideal Body Weight:  54.5 kg  BMI:  Body mass index is 20.5 kg/(m^2).  Estimated Nutritional  Needs:   Kcal:  1400-1600  Protein:  70-80 grams  Fluid:  >/= 1.5 L  EDUCATION NEEDS:   No education needs identified at this time  [place], LDN Inpatient Clinical Dietitian Pager: [phone] After Hours Pager: [phone]

## 2015-12-12 NOTE — Progress Notes (Addendum)
Received order for home hospice. Left home hospice agency list. Spoke w nse husb had gone out to run errands. Per nurse husband does not feel they could care for pt at home and would like possibly hospice medical facility. sw ref placed. Will cont to follow. Family would like [facility] as soon as possible. Have left vm for emily sw covering today and have asked eva davis to take a look at pt also. Will cont to follow.

## 2015-12-12 NOTE — Care Management Important Message (Signed)
Important Message  Patient Details  Name: [name] MRN: [id number] Date of Birth: 07-22-1946   Medicare Important Message Given:  Yes    [name] 12/12/2015, 12:40 PM

## 2015-12-12 NOTE — Progress Notes (Signed)
PT Cancellation Note  Patient Details Name: [name] MRN: [id number] DOB: Jun 04, 1946   Cancelled Treatment:    Reason Eval/Treat Not Completed: Other (comment) (spoke with nsg, plan for hospice, hold PT)   Duncan Dull 12/12/2015, 2:02 PM Alben Deeds, [place] DPT  [phone]

## 2015-12-13 MED ORDER — HYDROMORPHONE HCL 1 MG/ML IJ SOLN
1.0000 mg | INTRAMUSCULAR | Status: DC | PRN
  Administered 2015-12-13 (×6): 1 mg via INTRAVENOUS
  Filled 2015-12-13 (×6): qty 1

## 2015-12-13 MED ORDER — MORPHINE SULFATE ER 60 MG PO TBCR: 60.0000 mg | EXTENDED_RELEASE_TABLET | Freq: Two times a day (BID) | ORAL | Status: AC

## 2015-12-13 MED ORDER — MORPHINE SULFATE (CONCENTRATE) 10 MG/0.5ML PO SOLN
10.0000 mg | ORAL | Status: DC | PRN
  Administered 2015-12-13 – 2015-12-14 (×8): 10 mg via ORAL
  Filled 2015-12-13 (×8): qty 0.5

## 2015-12-13 MED ORDER — FUROSEMIDE 20 MG PO TABS
20.0000 mg | ORAL_TABLET | Freq: Every day | ORAL | Status: AC
Start: 2015-12-13 — End: ?

## 2015-12-13 MED ORDER — MORPHINE SULFATE ER 30 MG PO TBCR: 60.0000 mg | EXTENDED_RELEASE_TABLET | Freq: Two times a day (BID) | ORAL | Status: DC

## 2015-12-13 MED ORDER — LORAZEPAM 2 MG/ML PO CONC: 1.0000 mg | Freq: Four times a day (QID) | ORAL | Status: AC | PRN

## 2015-12-13 MED ORDER — LORAZEPAM 2 MG/ML IJ SOLN
1.0000 mg | Freq: Four times a day (QID) | INTRAMUSCULAR | Status: DC | PRN
  Administered 2015-12-13: 1 mg via INTRAVENOUS
  Filled 2015-12-13: qty 1

## 2015-12-13 MED ORDER — MORPHINE SULFATE (CONCENTRATE) 10 MG/0.5ML PO SOLN: 10.0000 mg | ORAL | Status: AC | PRN

## 2015-12-13 NOTE — Discharge Instructions (Signed)
Disposition residential hospice  Her condition is guarded  Activity as tolerated with assistance and fall precautions  Diet soft diet as tolerated with feeding assistance and aspiration precautions

## 2015-12-13 NOTE — Progress Notes (Signed)
Drip discontinued earlier, wasted in sink with Fabian Sharp, RN.

## 2015-12-13 NOTE — Discharge Summary (Signed)
[name], is a 70 y.o. female  DOB 06/30/1946  MRN [id number].  Admission date:  12/16/2015  Admitting Physician  Waldemar Dickens, MD  Discharge Date:  12/13/2015   Primary MD  Shirline Frees, MD  Recommendations for primary care physician for things to follow:   Goal of care is full comfort. Being discharged to residential hospice.   Admission Diagnosis  Renal insufficiency [N28.9] Right groin pain [R10.30]   Discharge Diagnosis  Renal insufficiency [N28.9] Right groin pain [R10.30]    Principal Problem:   AKI (acute kidney injury) ([place]) Active Problems:   ANEMIA NEC   Essential hypertension   CKD (chronic kidney disease)   False aneurysm (HCC)   Pseudoaneurysm of right femoral artery (HCC)   Ischemic chest pain (HCC)   Coronary artery disease due to lipid rich plaque   Cardiomyopathy, ischemic   Leukocytosis   HCAP (healthcare-associated pneumonia)   Physical deconditioning   Abdominal pain   Ascites   Mesenteric vascular insufficiency, chronic (HCC)   Acute on chronic systolic heart failure (HCC)   ARF (acute renal failure) (HCC)      Past Medical History  Diagnosis Date  . PVD (peripheral vascular disease) ([place])     status post prior femoral-popliteal bypass  . Back pain   . Arthritis   . CAD (coronary artery disease)     status post prior stenting to the proximal RCA, status post anterior STEMI 10/2008 (RCA occluded at the prior stent, LAD treated with a bare metal stent and a drug-eluting stent),  . Chronic systolic heart failure (HCC)     echo 11/2011: EF 25-30%, AS and apical AK, trivial AI, mild MR, PASP 34.  . Anemia   . Ischemic cardiomyopathy   . AICD (automatic cardioverter/defibrillator) present   . HLD (hyperlipidemia)   . GERD (gastroesophageal reflux disease)   . RLS  (restless legs syndrome)   . Depression   . Tobacco abuse   . Pain in joint, lower leg   . Depressive disorder, not elsewhere classified   . Acute myocardial infarction of other anterior wall, subsequent episode of care 2010  . CHF (congestive heart failure) ([place])   . COPD (chronic obstructive pulmonary disease) ([place])   . DVT (deep venous thrombosis) ([place])   . Mixed hyperlipidemia   . Gout   . Atherosclerosis of native arteries of the extremities with intermittent claudication   . Osteoarthritis   . Eczema   . Fall at home July 5,  and  Oct.  2015  . Cancer (HCC)     Skin  of Left  Face- " upper eye"5/15  . Hypertension   . Anginal pain ([place])   . Shortness of breath dyspnea     with exertion  . Chronic kidney disease     "years ago, saw Dr. Mcarthur Rossetti"  "no one has said anything recently"  . Stroke [facility])     before Heart attack, 2010  . Neuropathy, idiopathic     peripherial  Past Surgical History  Procedure Laterality Date  . Psychologist, forensic    . Foot surgery Bilateral     removed nrves off bottomof feet."  . Pr vein bypass graft,aorto-fem-pop      fem fem  04/07/09  . Abdominal hysterectomy    . Cardiac defibrillator placement    . Eye surgery  2013    Cataract bilateral   . Axillary-femoral bypass graft Right 06/29/2013    Procedure: BYPASS GRAFT AXILLA-RIGHT FEMORAL;  Surgeon: Rosetta Posner, MD;  Location: [place];  Service: Vascular;  Laterality: Right;  . Femoral-tibial bypass graft  2002  . Femoral-femoral bypass graft  03/2009    S/P left to right fem-fem bypass graft  . Abdominal aortagram Bilateral 06/26/2013    Procedure: ABDOMINAL AORTAGRAM;  Surgeon: Elam Dutch, MD;  Location: [facility] CATH LAB;  Service: Cardiovascular;  Laterality: Bilateral;  . Colonoscopy    . 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 False aneurysm repair Right  11/19/2015    Procedure: Resection of Right Femoral Anastamotic Pseudoaneurysm with Dacron Patch Angioplasy Right Common Femoral Artery ;  Surgeon: Mal Misty, MD;  Location: [facility] OR;  Service: Vascular;  Laterality: Right;       HPI  from the history and physical done on the day of admission:    [name] is a 70 y.o. female who presents for evaluation of pain in right inguinal area. Patient noted a "knot" in the right inguinal area were previous bypasses have been performed and this has slowly enlarged over the last several days. She has been having increasing discomfort over the past 3 days. She continues to have chronic back pain which is not new. She denies any change or claudication symptoms. She continues to have a heavy tobacco abuse problem smoking 1 pack per day. She has previous history of a femoral-femoral bypass graft and bilateral infrainguinal bypass grafts. She then underwent a right axillofemoral bypass graft by Dr. early in 2014. Her ABIs have been approximately 1.0 during follow-up in our office most recently a few months ago. She also has an AICD which is followed by Dr. Crissie Sickles and he saw her in January 2017.    Hospital Course:     Acute on chronic kidney injury. Baseline around 1.3 She Developed cardiorenal syndrome, cardiology and nephrology were consulted, she underwent PICC line placement on 12/10/2015 and combination of milrinone and Lasix was started, her renal function has since improved. She also had an stable renal ultrasound.However her over all long-term prognosis appears poor, she is extremely frail, practically bedbound with chronic severe low back pain. Family decided that patient has been suffering for several years now and they would now want to pursue residential hospice and comfort measures. Milrinone stopped, goal of care now discomfort at residential hospice. Continue low-dose Lasix.  Cardiomyopathy ischemic/ acute on chronic systolic and  diastolic heart failure. Chart review indicates echo 2013 with an EF of 25-30% - seen by cardiology however now family wants DO NOT RESUSCITATE and full comfort care only, her defibrillator will be switched up prior to discharge, milrinone will be stopped. Gentle medical treatment only now all directed towards comfort..  Leukocytosis. Blood culture 1/2 coagulase negative staph likely contaminant Initially treated for pneumonia and UTI, has finished antibiotic supportive care only now.  History of iron deficiency anemia  and anemia of chronic disease - transfusion of packed RBC on 3/20, H&H currently stable, no further lab draws.   Abdominal pain/constipation, hypoalbuminemia (1.8)- Colitis per recent CT abdomen on 3/18 versus ileus. GI following. Per GI patient has evidence of chronic bowel ischemia, no acute etiologies, abdominal exam is benign, supportive care only.  Hypertension. Supportive care only.   Deconditioning The patient is currently very debilitated and deconditioned, needs physical therapy and mobilization.   Recent right femoral artery aneurysm surgery management per vascular surgery which was the primary team. Case transferred to hospitalist service on 12/09/2015. Local wound care to the right groin.   Hypokalemia and hypomagnesemia. Replaced will monitor.  Incidental 9 mm left upper pole renal calculus. Outpt follow.  Generalized deconditioning, extremely frail status, appears to have severe protein calorie malnutrition. Total care only now DO NOT RESUSCITATE with disposition to residential hospice, goal of care comfort.     Discharge Condition: Guarded  Follow UP  Follow-up Information    Schedule an appointment as soon as possible for a visit with Shirline Frees, MD.   Specialty:  Family Medicine   Why:  As needed   Contact information:   [place] [address] [phone]        Consults obtained - Cards, Pall care,  Renal  Diet and Activity recommendation: See Discharge Instructions below  Discharge Instructions       Discharge Instructions    Discharge instructions    Complete by:  As directed   Disposition residential hospice  Her condition is guarded  Activity as tolerated with assistance and fall precautions  Diet soft diet as tolerated with feeding assistance and aspiration precautions             Discharge Medications       Medication List    STOP taking these medications        allopurinol 100 MG tablet  Commonly known as:  ZYLOPRIM     aspirin EC 81 MG tablet     clonazePAM 1 MG tablet  Commonly known as:  KLONOPIN     estrogens (conjugated) 1.25 MG tablet  Commonly known as:  PREMARIN     ezetimibe 10 MG tablet  Commonly known as:  ZETIA     ferrous sulfate 325 (65 FE) MG tablet     HYDROcodone-acetaminophen 10-325 MG tablet  Commonly known as:  NORCO     ketoconazole 2 % cream  Commonly known as:  NIZORAL     metoprolol succinate 50 MG 24 hr tablet  Commonly known as:  TOPROL-XL     ramipril 2.5 MG capsule  Commonly known as:  ALTACE     traMADol 50 MG tablet  Commonly known as:  ULTRAM     Vitamin D 1000 units capsule     zolpidem 10 MG tablet  Commonly known as:  AMBIEN      TAKE these medications        furosemide 20 MG tablet  Commonly known as:  LASIX  Take 1 tablet (20 mg total) by mouth daily.     KLOR-CON M20 20 MEQ tablet  Generic drug:  potassium chloride SA  TAKE 1 TABLET BY MOUTH ON MONDAYS,WEDNESDAYS, AND FRIDAYS     LORazepam 2 MG/ML concentrated solution  Commonly known as:  ATIVAN  Take 0.5 mLs (1 mg total) by mouth every 6 (six) hours as needed for anxiety.     meclizine 12.5 MG tablet  Commonly  known as:  ANTIVERT  Take 12.5 mg by mouth 3 (three) times daily as needed for dizziness.     morphine 60 MG 12 hr tablet  Commonly known as:  MS CONTIN  Take 1 tablet (60 mg total) by mouth every 12 (twelve) hours.      morphine CONCENTRATE 10 MG/0.5ML Soln concentrated solution  Take 0.5 mLs (10 mg total) by mouth every hour as needed for moderate pain, severe pain or shortness of breath.     nitroGLYCERIN 0.4 MG SL tablet  Commonly known as:  NITROSTAT  Place 0.4 mg under the tongue every 5 (five) minutes as needed for chest pain.     pantoprazole 40 MG tablet  Commonly known as:  PROTONIX  Take 40 mg by mouth 2 (two) times daily.     PRESCRIPTION MEDICATION  Apply 1 application topically daily as needed (itching). Compounded cream:  Ketoconazole 2% cream/ fluticasone 0.05% cream 1:1        Major procedures and Radiology Reports - PLEASE review detailed and final reports for all details, in brief -       Ct Abdomen Pelvis Wo Contrast  12/08/2015  CLINICAL DATA:  Diffuse abdominal pain for 1 day. EXAM: CT ABDOMEN AND PELVIS WITHOUT CONTRAST TECHNIQUE: Multidetector CT imaging of the abdomen and pelvis was performed following the standard protocol without IV contrast. COMPARISON:  12/03/2015. FINDINGS: Lower chest: Persistent and slightly larger bilateral pleural effusions and overlying atelectasis or infiltrates. The heart is enlarged. Stable aortic and coronary artery calcifications. Hepatobiliary: No focal hepatic lesions or intrahepatic biliary dilatation. The gallbladder is surgically absent. Pancreas: No mass, inflammation or ductal dilatation. Spleen: Normal size.  No focal lesions. Adrenals/Urinary Tract: Small left kidney is stable. Left renal calculus is unchanged. No hydroureteronephrosis. Stomach/Bowel: The stomach is displaced superiorly and laterally by a a large amount of fluid in the lesser sac. There is also increasing abdominal and pelvic ascites. The duodenum, small bowel and colon are grossly normal. No inflammatory changes, mass lesions or obstructive findings. No leaking oral contrast or free air. Vascular/Lymphatic: Stable tortuous, ectatic and calcified abdominal aorta. No mesenteric  or retroperitoneal mass or adenopathy. Stable surgical changes from an right axillofemoral and fem-fem bypass graft. 5.0 x 2.3 cm hematoma noted near the right femoral graft. Other: Worsening diffuse body wall edema suggesting anasarca. The bladder is mildly distended. It does contain air but this could be from recent catheterization. Increasing free pelvic fluid. Musculoskeletal: No significant/acute osseous findings. IMPRESSION: 1. Enlarging bilateral pleural effusions and bibasilar atelectasis/infiltrates. 2. Increasing abdominal and pelvic fluid. There is a significant amount of fluid in the lesser sac which has mass effect on the stomach. No findings for perforation. 3. Worsening diffuse body wall edema suggesting anasarca. 4. Stable surgical changes from a right axillofemoral bypass graft and fem-fem bypass graft. Resolving hematoma in the right upper groin area. Electronically Signed   By: Marijo Sanes M.D.   On: 12/08/2015 17:18   Ct Abdomen Pelvis Wo Contrast  12/03/2015  CLINICAL DATA:  Status post axillofemoral bypass with elevated white blood cell count. EXAM: CT ABDOMEN AND PELVIS WITHOUT CONTRAST TECHNIQUE: Multidetector CT imaging of the abdomen and pelvis was performed following the standard protocol without IV contrast. COMPARISON:  November 30, 2015 FINDINGS: New bilateral effusions, left greater than right. Underlying opacity is likely atelectasis. Cardiomegaly persists. No other abnormalities identified in the lung bases. No free air. There is increased attenuation in the subcutaneous fat consistent with volume overload. There  is also ascites within the abdomen, particularly the left paracolic gutter, and in the pelvis. The left-sided predominance of ascites could be due to the fact the patient is tilted to the left. Patient is status post cholecystectomy. The liver, spleen, and right adrenal gland are normal. Prominence of the left adrenal gland is likely due to hyperplasia the unchanged. A  nonobstructing left renal stone is again identified. Right kidney is unchanged and unremarkable. Pancreas demonstrates no abnormalities. 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 Atherosclerosis. No adenopathy. The stomach is normal. A few mildly prominent loops of proximal small-bowel are contrast filled and thin walled measuring up to 3 cm. Contrast does extend all the way to the colon. The descending colon is decompressed. The descending colon does demonstrate apparent wall thickening. No pericolonic fat stranding in regions without ascites. The more proximal and well-distended colon is normal. The appendix is not seen but there is no secondary evidence of appendicitis. The pelvis demonstrates previous hysterectomy. The bladder is partially decompressed with a Foley catheter. No adenopathy. There is hematoma surrounding the surgical site in the right groin. Patency and integrity of the graft cannot be assessed on this study due to lack of contrast. However, previously seen aneurysm is no longer visualized. Visualized bones are unremarkable. IMPRESSION: 1. Postoperative changes in the right groin with surrounding hematoma. The hematoma measures 3.6 by 2.9 cm on axial image 82 and 8.5 cm in cranial caudal dimension on image 24. The small amount of adjacent air in the soft tissues may all be postsurgical. 2. Wall thickening in the descending colon could be due to poor distention, as suggested by lack of pericolonic stranding in sites without ascites. However, given fever, colitis, is not completely excluded. Recommend clinical correlation. 3. Fluid overload with ascites and abdominal wall edema. Electronically Signed   By: Dorise Bullion III M.D   On: 12/03/2015 13:26   Ct Abdomen Pelvis Wo Contrast  12/07/2015  CLINICAL DATA:  Right groin pain and swelling, initial encounter EXAM: CT ABDOMEN AND PELVIS WITHOUT CONTRAST TECHNIQUE: Multidetector CT imaging of  the abdomen and pelvis was performed following the standard protocol without IV contrast. COMPARISON:  07/09/2007 FINDINGS: Lung bases are free of acute infiltrate or sizable effusion. The gallbladder has been surgically removed. The liver, spleen, adrenal glands and pancreas are within normal limits. The kidneys are well visualized without renal calculi. A cortical calcification is noted in the left kidney. The abdominal aorta is dilated to 3.1 cm. There are changes consistent with a right axillobifemoral bypass graft. 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 The actual degree and patency within the aneurysmal dilatation cannot be assessed on this exam due to the lack of IV contrast. The femoral crossover graft appears within normal limits. The bladder is well distended. No pelvic mass lesion or sidewall abnormality is noted. The appendix is not well visualized. The osseous structures are within normal limits. 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 The degree of patency of the aneurysmal dilatation is uncertain as no contrast was administered for this exam. Ultrasound may be helpful for further evaluation. Vascular surgery consultation recommended. Dilatation of the abdominal aorta to 3 cm. Recommend followup by ultrasound in 3 years. This recommendation follows ACR consensus guidelines: White Paper of the ACR Incidental Findings Committee II on Vascular  Findings. Natasha Mead Coll Radiol 2013; 10:[id number] Electronically Signed   By: Inez Catalina M.D.   On: 11/16/2015 19:38   US Renal  12/10/2015  CLINICAL DATA:  Acute renal failure EXAM: RENAL / URINARY TRACT ULTRASOUND COMPLETE COMPARISON:  CT abdomen pelvis dated 12/08/2015 FINDINGS: Right Kidney: Length: 9.3 cm.  Poorly visualized.  No mass or hydronephrosis. Left Kidney: Length: 8.0 cm.  9 mm upper  pole renal calculus.  No hydronephrosis. Bladder: Within normal limits. IMPRESSION: 9 mm left upper pole renal calculus. No hydronephrosis. Electronically Signed   By: Julian Hy M.D.   On: 12/10/2015 17:09   Dg Chest Port 1 View  12/12/2015  CLINICAL DATA:  Patient with recent onset shortness of breath. EXAM: PORTABLE CHEST 1 VIEW COMPARISON:  Chest radiograph 12/10/2015 FINDINGS: New right upper extremity PICC line is present with tip projecting over the superior cavoatrial junction. Multiple monitoring leads overlie the patient. Single lead AICD device overlies the left hemi thorax, lead is stable in position. Stable cardiac and mediastinal contours. Minimal heterogeneous opacities left lung base. No pleural effusion or pneumothorax. IMPRESSION: New right upper extremity PICC line is present tip projecting over the superior cavoatrial junction. Heterogeneous opacities left lung base favored to represent atelectasis. Infection not excluded. Electronically Signed   By: Lovey Newcomer M.D.   On: 12/12/2015 08:07   Dg Chest Port 1 View  12/10/2015  CLINICAL DATA:  Short of breath EXAM: PORTABLE CHEST 1 VIEW COMPARISON:  12/07/2015. FINDINGS: LEFT-sided pacer with single continuously overlies enlarged cardiac silhouette. There is chronic pulmonary arteries not changed. LEFT basilar atelectasis similar prior. Mild central venous congestion. IMPRESSION: 1. Mild central venous congestion which is mildly worsened. 2. Stable LEFT basilar atelectasis versus infiltrate Electronically Signed   By: Suzy Bouchard M.D.   On: 12/10/2015 07:35   Dg Chest Port 1 View  12/07/2015  CLINICAL DATA:  Shortness of breath with productive cough EXAM: PORTABLE CHEST 1 VIEW COMPARISON:  12/02/15 chest radiograph. FINDINGS: Stable configuration of single lead left subclavian ICD with lead tip overlying the right ventricle. Stable cardiomediastinal silhouette with mild cardiomegaly. No pneumothorax. New small left pleural  effusion. New mild pulmonary edema. Patchy right infrahilar opacity appears new. Patchy consolidation at the left lung base appears increased. IMPRESSION: 1. Mild congestive heart failure. 2. New small left pleural effusion. 3. New patchy right infrahilar opacity and increased patchy left lung base consolidation, suspicious for a combination of atelectasis, pneumonia and/or aspiration. Recommend follow-up chest imaging to resolution. Electronically Signed   By: Ilona Sorrel M.D.   On: 12/07/2015 10:35   Dg Chest Port 1 View  12/02/2015  CLINICAL DATA:  Shortness of breath. Status post surgical repair of right femoral anastomotic pseudoaneurysm. EXAM: PORTABLE CHEST 1 VIEW COMPARISON:  11/29/2015 FINDINGS: Stable appearance of ICD/pacing device. The heart size and mediastinal contours are normal. Lung volumes are normal without significant atelectasis identified. There is no evidence of pulmonary edema, consolidation, pneumothorax, nodule or pleural fluid. IMPRESSION: No active disease. Electronically Signed   By: Aletta Edouard M.D.   On: 12/02/2015 08:34   Dg Chest Portable 1 View  12/15/2015  CLINICAL DATA:  Chest pain for 1 day EXAM: PORTABLE CHEST 1 VIEW COMPARISON:  Chest x-ray dated 09/15/2013. FINDINGS: Mild cardiomegaly is stable. Left chest wall pacemaker/AICD stable in position. Atherosclerotic changes again noted at the aortic arch. Overall cardiomediastinal silhouette is stable in size and configuration. Lungs are clear. No evidence of pneumonia. No pleural effusion. No pneumothorax  seen. Osseous structures about the chest are unremarkable. IMPRESSION: Stable chest x-ray. Stable mild cardiomegaly. Lungs are clear and there is no evidence of acute cardiopulmonary abnormality. Electronically Signed   By: Franki Cabot M.D.   On: 12/01/2015 20:18   Dg Abd Portable 1v  12/12/2015  CLINICAL DATA:  Abdominal pain EXAM: PORTABLE ABDOMEN - 1 VIEW COMPARISON:  None. FINDINGS: Scattered large and small  bowel gas is noted. Contrast material is noted throughout the colon consistent with the recent CT examination. No obstructive changes or free air are noted. Bony structures are within normal limits. Left common iliac artery stent is seen. IMPRESSION: No acute abnormality noted. Electronically Signed   By: Inez Catalina M.D.   On: 12/12/2015 08:36    Micro Results      Recent Results (from the past 240 hour(s))  Urine culture     Status: None   Collection Time: 12/07/15  2:06 PM  Result Value Ref Range Status   Specimen Description URINE, RANDOM  Final   Special Requests ADDED [id number]  Final   Culture >=100,000 COLONIES/mL YEAST  Final   Report Status 12/09/2015 FINAL  Final  Culture, blood (routine x 2) Call MD if unable to obtain prior to antibiotics being given     Status: None   Collection Time: 12/07/15  3:00 PM  Result Value Ref Range Status   Specimen Description BLOOD LEFT HAND  Final   Special Requests IN PEDIATRIC BOTTLE [place]  Final   Culture NO GROWTH 5 DAYS  Final   Report Status 12/12/2015 FINAL  Final  Culture, blood (routine x 2) Call MD if unable to obtain prior to antibiotics being given     Status: None   Collection Time: 12/07/15  3:10 PM  Result Value Ref Range Status   Specimen Description BLOOD RIGHT HAND  Final   Special Requests IN PEDIATRIC BOTTLE 1CC  Final   Culture  Setup Time   Final    GRAM POSITIVE COCCI IN CLUSTERS AEROBIC BOTTLE ONLY CRITICAL RESULT CALLED TO, READ BACK BY AND VERIFIED WITH: A SAMUELSON 12/08/15 @ 51 M VESTAL    Culture   Final    STAPHYLOCOCCUS SPECIES (COAGULASE NEGATIVE) THE SIGNIFICANCE OF ISOLATING THIS ORGANISM FROM A SINGLE SET OF BLOOD CULTURES WHEN MULTIPLE SETS ARE DRAWN IS UNCERTAIN. PLEASE NOTIFY THE MICROBIOLOGY DEPARTMENT WITHIN ONE WEEK IF SPECIATION AND SENSITIVITIES ARE REQUIRED.    Report Status 12/10/2015 FINAL  Final  MRSA PCR Screening     Status: None   Collection Time: 12/10/15  1:08 PM  Result Value Ref  Range Status   MRSA by PCR NEGATIVE NEGATIVE Final    Comment:        The GeneXpert MRSA Assay (FDA approved for NASAL specimens only), is one component of a comprehensive MRSA colonization surveillance program. It is not intended to diagnose MRSA infection nor to guide or monitor treatment for MRSA infections.        Today   Subjective    [name] today has no headache,no chest abdominal pain,no new weakness tingling or numbness.Chronic Back pain.   Objective   Blood pressure 119/65, pulse 89, temperature 97.4 F (36.3 C), temperature source Axillary, resp. rate 10, height 5\' 4"  (1.626 m), weight 54.2 kg (119 lb 7.8 oz), SpO2 98 %.   Intake/Output Summary (Last 24 hours) at 12/13/15 0936 Last data filed at 12/13/15 0140  Gross per 24 hour  Intake   1.08 ml  Output  1400 ml  Net -1398.92 ml    Exam Awake Alert, Oriented x 1, No new F.N deficits, Normal affect [place].AT,PERRAL Supple Neck,No JVD, No cervical lymphadenopathy appriciated.  Symmetrical Chest wall movement, Good air movement bilaterally, CTAB RRR,No Gallops,Rubs or new Murmurs, No Parasternal Heave +ve B.Sounds, Abd Soft, Non tender, No organomegaly appriciated, No rebound -guarding or rigidity. No Cyanosis, Clubbing or edema, No new Rash or bruise   Data Review   CBC w Diff: Lab Results  Component Value Date   WBC 28.5* 12/12/2015   HGB 9.1* 12/12/2015   HCT 28.3* 12/12/2015   PLT 140* 12/12/2015   LYMPHOPCT 19 05/09/2015   MONOPCT 7 05/09/2015   EOSPCT 1 05/09/2015   BASOPCT 1 05/09/2015    CMP: Lab Results  Component Value Date   NA 142 12/12/2015   K 5.3* 12/12/2015   CL 107 12/12/2015   CO2 26 12/12/2015   BUN 37* 12/12/2015   CREATININE 2.13* 12/12/2015   PROT 4.8* 12/09/2015   ALBUMIN 1.8* 12/09/2015   BILITOT 1.1 12/09/2015   ALKPHOS 62 12/09/2015   AST 11* 12/09/2015   ALT 6* 12/09/2015  .   Total Time in preparing paper work, data evaluation and todays exam - 35  minutes  Thurnell Lose M.D on 12/13/2015 at 9:36 AM  [facility]   Office  [phone]

## 2015-12-13 NOTE — Progress Notes (Signed)
Received report from [place], Therapist, sports. Pt transferred from [facility] to 5W10 and is resting comfortably with no physical or verbal expressions of pain at this time. Vital signs stable at this time. O2 saturation at 90% on 6 liters. Right upper arm PICC is clean, dry, and intact with Dilaudid drip infusing at this time.  Left forearm IV is clean, dry, intact and saline locked.  Will continue to monitor and treat per MD orders.

## 2015-12-13 NOTE — Progress Notes (Addendum)
2:15pm CSW received call from [facility] that pt family is no longer agreeable to [facility].  CSW followed up with pt dtr, Freda Munro, who states that pt husband lives almost an hour away from [place] and it would be impossible for him to get to the facility.  Dtr states that several people have informed them they could wait till [facility] has a bed.  CSW explained that doctor had put in discharge orders.  At this time dtr against having pt moved outside of [place] so family can spend last moments at bedside with pt.  CSW paged MD to update   9:45am CSW spoke with [facility] this morning- no beds available today.  CSW spoke with pt husband, Bobby Rumpf, and daughter, Freda Munro- they are agreeable to [facility] made referral they believe they will have a bed today if pt is appropriate for admission.  CSW will continue to follow  Domenica Reamer, [place] Social Worker [phone]

## 2015-12-13 NOTE — Care Management Note (Signed)
Case Management Note  Patient Details  Name: [name] MRN: [id number] Date of Birth: Sep 21, 1945  Subjective/Objective:                    Action/Plan:  Patient discharging to residential hospice today as facilitated by CSW.  Expected Discharge Date:                  Expected Discharge Plan:  [place]  In-House Referral:  Clinical Social Work  Discharge planning Services  CM Consult  Post Acute Care Choice:    Choice offered to:  Adult Children  DME Arranged:    DME Agency:     HH Arranged:    HH Agency:     Status of Service:  Completed, signed off  Medicare Important Message Given:  Yes Date Medicare IM Given:    Medicare IM give by:    Date Additional Medicare IM Given:    Additional Medicare Important Message give by:     If discussed at [place] of Stay Meetings, dates discussed:    Additional Comments:  Carles Collet, RN 12/13/2015, 11:17 AM

## 2015-12-13 NOTE — Consult Note (Signed)
   [facility] Inpatient Consult   12/13/2015  [name] 1946/07/25 [id number] Patient has been screened for [place] Management services as a benefit of [employer]. Chart encounter reviewed.  Patient and family are currently discharging with Hospice care and will receive full services under the Hospice care. For questions, please contact: Natividad Brood, RN BSN [facility] Liaison  [phone] business mobile phone Toll free office [phone]

## 2015-12-14 IMAGING — CT CT CERVICAL SPINE W/O CM
4 of 5 series · 14 of 33 positions shown, 16 images · non-contrast
Comparison: Noncontrast CT scan of the brain November 22, 2011

CLINICAL DATA: Status post fall

EXAM:
CT HEAD WITHOUT CONTRAST
TECHNIQUE: Contiguous axial images were obtained from the base of the skull
through the vertex without intravenous contrast.

[Series 5: c_spine 2.0 i40s 3 · axial · 0.25mm/px · z∈[-291,-183]mm · 4 of 90 slices shown, 5 images]
[im 18/90  soft-tissue]
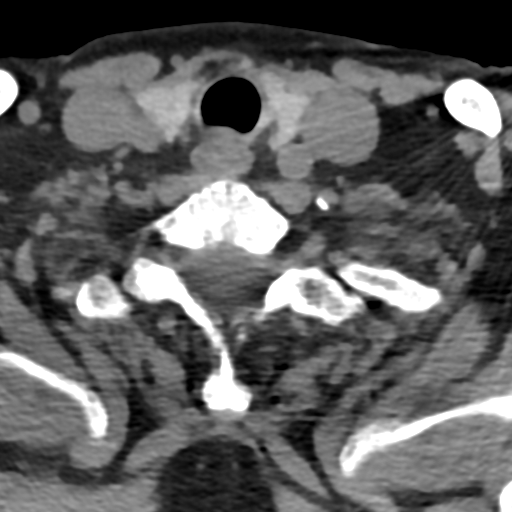
[im 18/90  bone]
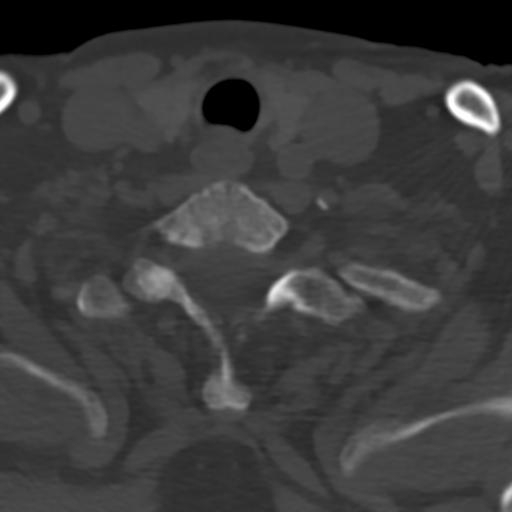
[im 36/90  bone]
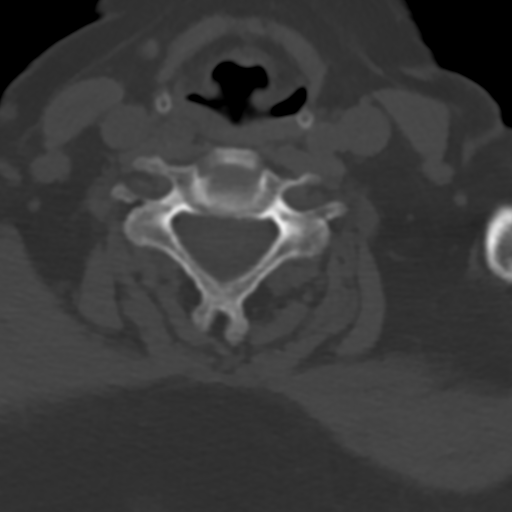
[im 54/90  bone]
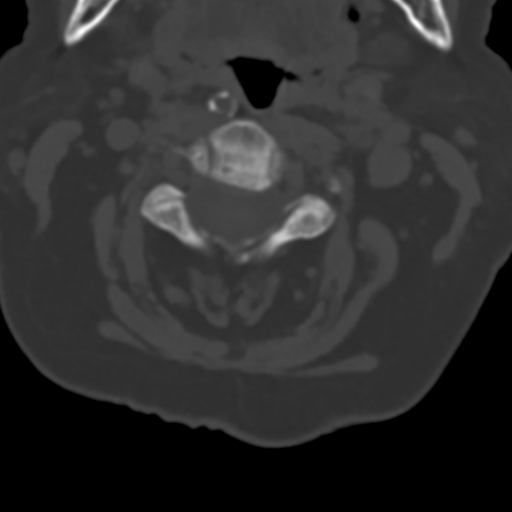
[im 72/90  bone]
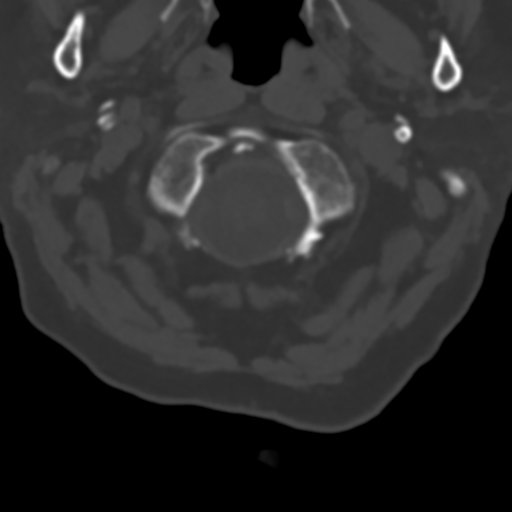

[Series 7: coronals · coronal · 0.27mm/px · 3 of 40 slices shown]
[im 8/40  bone]
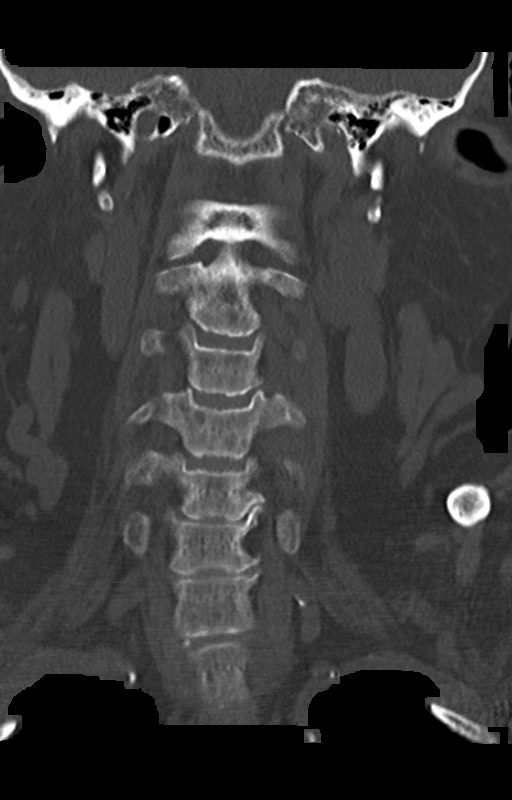
[im 16/40  bone]
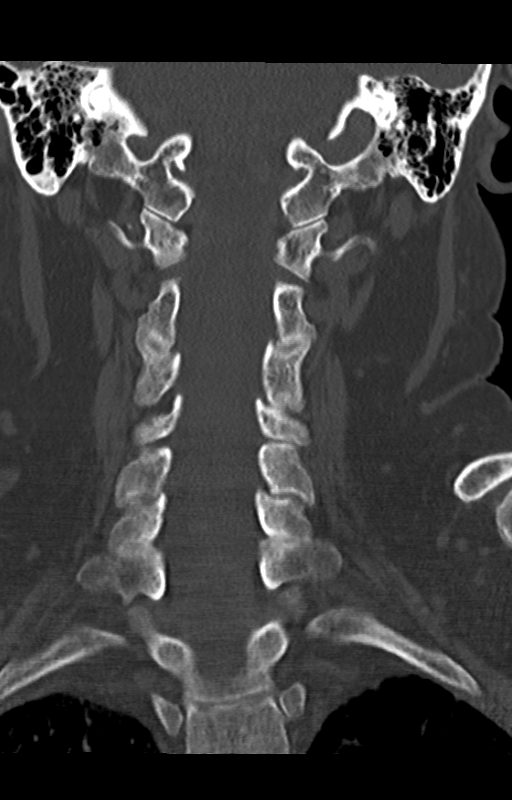
[im 24/40  bone]
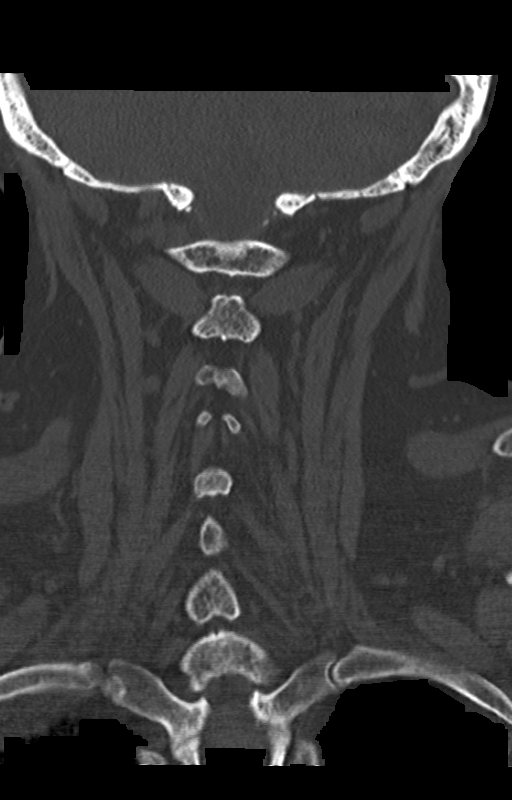

[Series 8: sagittals · sagittal · 0.26mm/px · 5 of 52 slices shown, 6 images]
[im 18/52  bone]
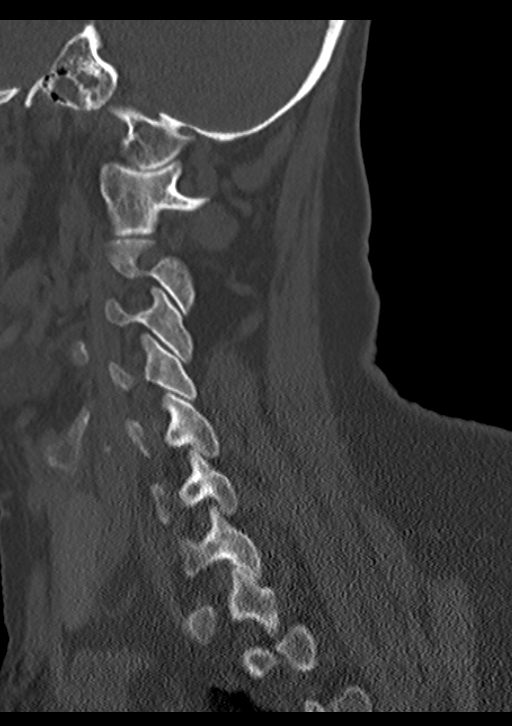
[im 22/52  bone]
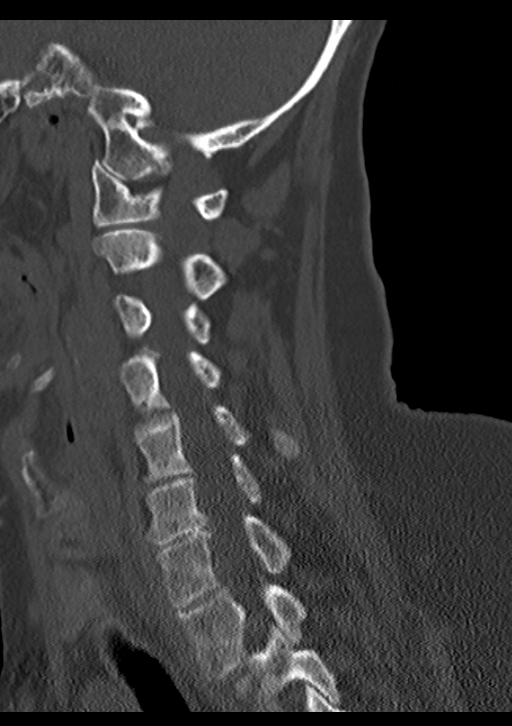
[im 26/52  soft-tissue]
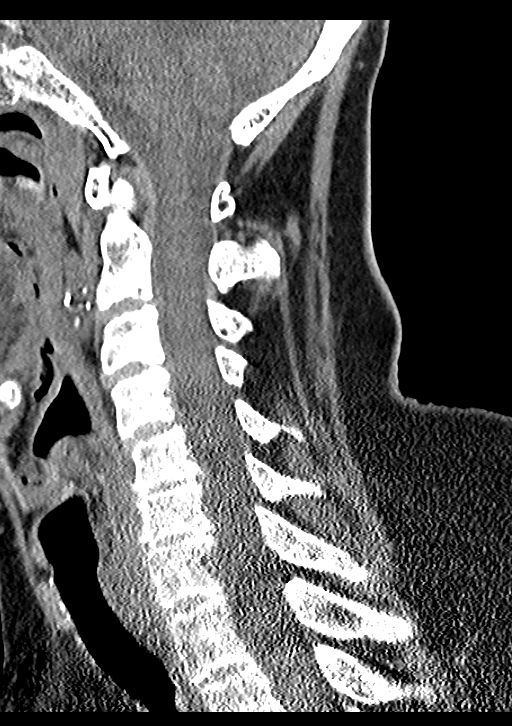
[im 26/52  bone]
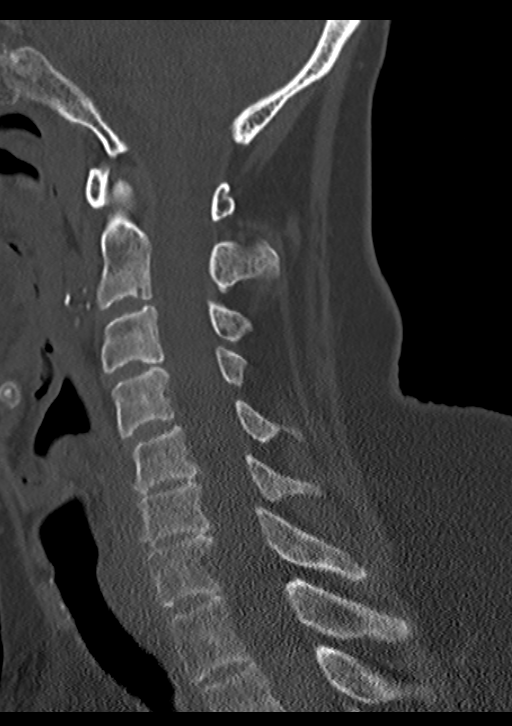
[im 30/52  bone]
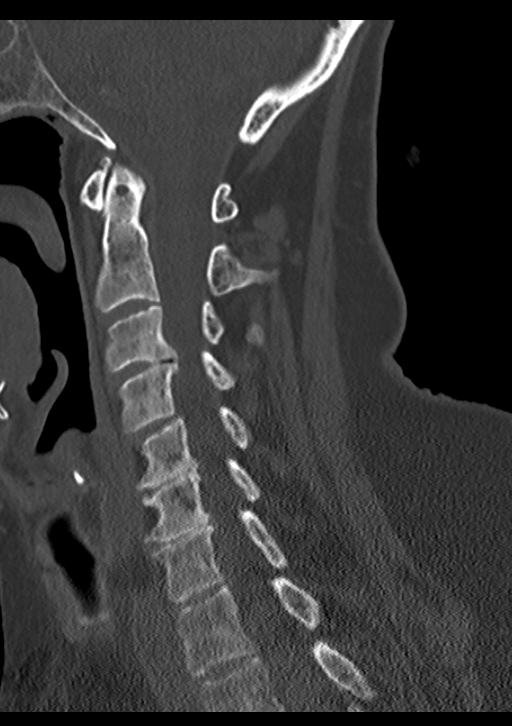
[im 35/52  bone]
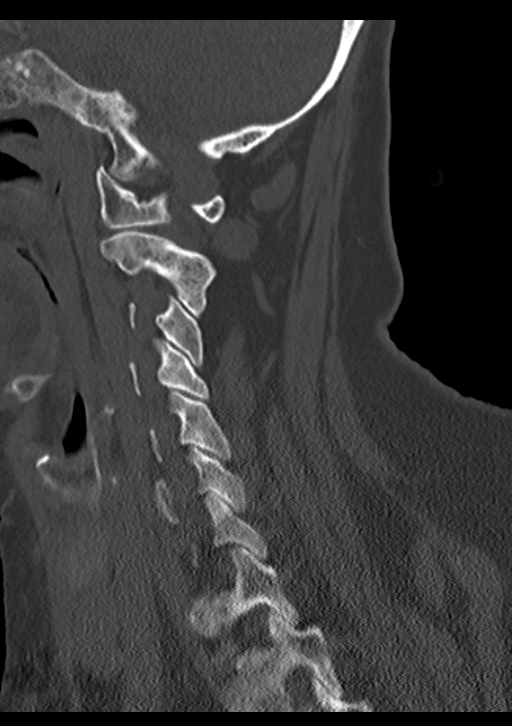

[Series 9: orthogonals · axial · 0.32mm/px · z∈[-310,-275]mm · 2 of 89 slices shown]
[im 18/89  bone]
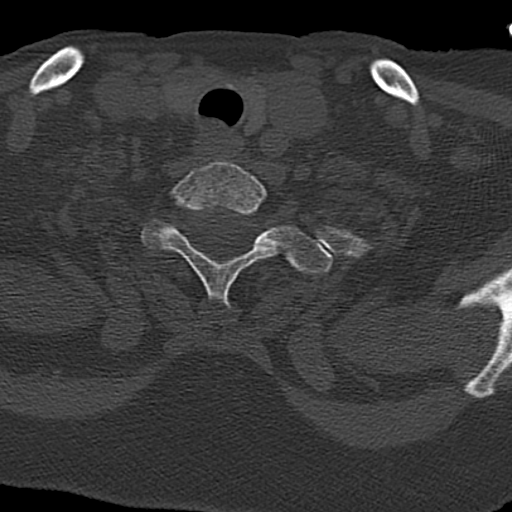
[im 36/89  bone]
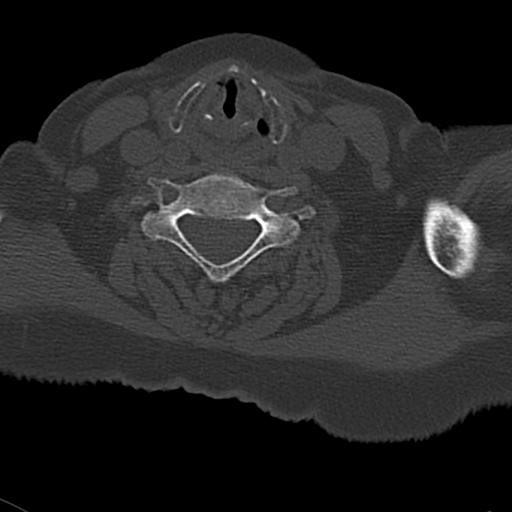

[14 of 33 positions shown; findings below may reference images not displayed]

FINDINGS: There is mild diffuse cerebral and cerebellar atrophy with mild
compensatory ventriculomegaly. These findings are stable. Old small
lacunar infarctions in the basal ganglia are again demonstrated.
There is decreased density in the deep white matter of both cerebral
hemispheres consistent with chronic small vessel ischemic type
change. An area of stable encephalomalacia in the left posterior
parietal lobe is again demonstrated.

The paranasal sinuses and mastoid air cells are clear. There is no
acute skull fracture.
IMPRESSION: There is no acute intracranial hemorrhage nor other acute
intracranial abnormality. Significant chronic change is present
consistent with small vessel ischemia.

## 2015-12-14 NOTE — Progress Notes (Signed)
Found Pt pulseless, without respirations and heart sounds at 10:30 am. Second RN verification by Johnsie Kindred.

## 2015-12-14 NOTE — Discharge Summary (Signed)
[facility] Death Note                                                                                                                                                                                                Death Note please see Last Note for all details.   In breif -    [name] [id number] is a 70 y.o. female, Outpatient Primary MD for the patient is Shirline Frees, MD  Pronounced dead by RN  on 12/29/15 at 10:45 AM                   Cause of death ischemic cardiomyopathy patient was on comfort care and awaiting residential hospice bed. All medications were stopped. Patient passed away comfortably with family bedside.  Signature  Thurnell Lose M.D on 12/29/2015 at 10:45 AM  [facility]  Office Phone -[phone]  Total clinical and documentation time for today Under 30 minutes   Last Note        [facility] progress note                                                                               Patient Demographics  [name], is a 70 y.o. female, DOB - [birth year], [id number]  Admit date - 11/20/2015   Admitting Physician Waldemar Dickens, MD  Outpatient Primary MD for the patient is Shirline Frees, MD  LOS - 14  days    Chief Complaint  Patient presents with  . Groin Pain  . Back Pain       Brief HPI   70 year old Female with CAD, chronic systolic heart failure, anemia, ASCVD, tobacco use, CHF . Admitted by vascular with severe PAD and underwent right femoral pseudoaneurysm expansion on March15th. Since that time she's  had slow progression. Her chronic kidney disease slightly worsening as well as her chronic IDA, had transfusion on 3/20. Patient complained of abdominal pain. IM service consulted for medical management, worsening of anemia, renal function, HCAP.   Family now wants DNR and Hospice, will  arrange.   Subjective:   Patient in bed, husband bedside, denies any headache, no fever or chills, no chest pain or cough, no shortness of breath or abdominal pain. No focal weakness.   Assessment & Plan    Acute on chronic kidney injury.  Baseline around 1.3 She Developed cardiorenal syndrome, cardiology and nephrology were consulted, she underwent PICC line placement on 12/10/2015 and combination of milrinone and Lasix was started, her renal function has since improved. She also had an stable renal ultrasound.  Cardiomyopathy ischemic/ acute on chronic systolic and diastolic heart failure. Chart review indicates echo 2013 with an EF of 25-30% - she Developed cardiorenal syndrome this admission, underwent PICC line placement on 12/10/2015 by cardiology after which milrinone was started with good effect, renal function has improved, however patient looks like a poor long-term candidate for inotropes at home. Continue low-dose beta blocker. No ACE/ARB due to renal failure.   Family now wants DNR and Hospice, will arrange. Switch off defibrillator. Likely DC milrinone and Tele as well.    Leukocytosis. Blood culture 1/2 coagulase negative staph  likely contaminant  Initially treated for pneumonia, also had evidence of UTI, blood cultures 1 out of 2 coag-negative staph which is contamination. She is afebrile. No cough or shortness of breath, no oxygen demand. Right groin site appears stable. I do not think clinically she has pneumonia. She has been treated with vancomycin and Zosyn for at least 4 days, we'll stop all antibiotics and monitor clinically. Encourage the patient to sit up in the chair, use incentive spirometer and flutter valve both of which have been added. She does have atelectasis and at risk for developing pneumonia.   History of iron deficiency anemia and anemia of chronic disease - transfusion of packed RBC on 3/20,  H&H currently stable    Abdominal pain/constipation,  hypoalbuminemia (1.8)- Colitis per recent CT abdomen on 3/18 versus ileus. GI following. Per GI patient has evidence of chronic bowel ischemia, no acute etiologies, abdominal exam is benign, no diarrhea, will monitor. Bowel regimen for constipation. Does have some anasarca and third spacing of fluid. We will add protein supplementation and monitor. SMOG enema on 12-12-15, already on B regimen, KUB stable.  Hypertension. Controlled on beta blocker.    Deconditioning  The patient is currently very debilitated and deconditioned, needs physical therapy and mobilization.   Recent right femoral artery aneurysm surgery management per vascular surgery which was the primary team. Case transferred to hospitalist service on 12/09/2015. Vascular surgery will continue to follow.   Hypokalemia and hypomagnesemia. Replaced will monitor.  Incidental 9 mm left upper pole renal calculus. Outpt follow.  Generalized deconditioning, extremely frail status, appears to have severe protein calorie malnutrition. Place her protein supplementation, family now wants DO NOT RESUSCITATE. We'll request palliative care to evaluate as well for goals of care.    Family Communication: Discussed with husband in detail on 12/10/2015.   Disposition Plan:   Hospice soon  Code status DO NOT RESUSCITATE.  Consults. Cardiology, renal, vascular surgery  DVT prophylaxis. Will start heparin on 12/11/2015   Time Spent in minutes  25 minutes   Medications  Scheduled Meds: . morphine  60 mg Oral Q12H   Continuous Infusions:  PRN Meds:.acetaminophen, acetaminophen **OR** [DISCONTINUED] acetaminophen, albuterol, HYDROmorphone (DILAUDID) injection, LORazepam, morphine CONCENTRATE, ondansetron, sodium chloride flush   Antibiotics   Anti-infectives    Start     Dose/Rate Route Frequency Ordered Stop   12/09/15 1500  vancomycin (VANCOCIN) IVPB 750 mg/150 ml premix  Status:  Discontinued     750 mg 150 mL/hr over 60 Minutes  Intravenous Every 48 hours 12/07/15 1435 12/10/15 1027   12/08/15 1800  piperacillin-tazobactam (ZOSYN) IVPB 2.25 g  Status:  Discontinued     2.25 g 100 mL/hr over 30 Minutes Intravenous 4 times per day 12/08/15 1059 12/10/15 1027   12/08/15 1200  piperacillin-tazobactam (ZOSYN) IVPB 3.375 g     3.375 g 100 mL/hr over 30 Minutes Intravenous  Once 12/08/15 1059 12/08/15 1245   12/07/15 1500  vancomycin (VANCOCIN) IVPB 1000 mg/200 mL premix     1,000 mg 200 mL/hr over 60 Minutes Intravenous  Once 12/07/15 1435 12/07/15 1806   12/07/15 1500  ceFEPIme (MAXIPIME) 1 g in dextrose 5 % 50 mL IVPB  Status:  Discontinued     1 g 100 mL/hr over 30 Minutes Intravenous Every 24 hours 12/07/15 1435 12/08/15 1026   12/07/15 1400  ceFEPIme (MAXIPIME) 1 g in dextrose 5 % 50 mL IVPB  Status:  Discontinued     1 g 100 mL/hr over 30 Minutes Intravenous Every 24 hours 12/07/15 1339 12/07/15 1435   12/03/15 1000  piperacillin-tazobactam (ZOSYN) IVPB 3.375 g  Status:  Discontinued     3.375 g 12.5 mL/hr over 240 Minutes Intravenous Every 8 hours 12/03/15 0944 12/07/15 1339   12/01/15 0400  cefUROXime (ZINACEF) 1.5 g in dextrose 5 % 50 mL IVPB     1.5 g 100 mL/hr over 30 Minutes Intravenous Every 12 hours 12/01/15 0057 12/01/15 1742        Objective:   Filed Vitals:   12/13/15 2300  0154  0509  0900  BP:   126/66   Pulse:   111   Temp:   97.4 F (36.3 C)   TempSrc:   Axillary   Resp: 7 8 9 16   Height:      Weight:      SpO2:   98%     Intake/Output Summary (Last 24 hours) at  1045 Last data filed at 12/13/15 1530  Gross per 24 hour  Intake      0 ml  Output    450 ml  Net   -450 ml     Wt Readings from Last 3 Encounters:  12/12/15 54.2 kg (119 lb 7.8 oz)  09/27/15 48.353 kg (106 lb 9.6 oz)  09/14/15 48.081 kg (106 lb)     Exam  General: Alert and oriented, appears ill  HEENT:    Neck:   CVS: S1clear, regular rate and rhythm.  Respiratory:  diffuse coarse breath sounds   Abdomen: Soft,  mild diffuse tenderness although improving from yesterday, + bowel sounds  Ext: no cyanosis clubbing,  RLE edema 1+, left lower extremity no edema, right groin dressing intact   Neuro: able to lift up her legs, strength 5/5 upper extremities  Skin: No rashes  Psych: much more alert today   Data Reviewed:  I have personally reviewed following labs and imaging studies  Micro Results Recent Results (from the past 240 hour(s))  Urine culture     Status: None   Collection Time: 12/07/15  2:06 PM  Result Value Ref Range Status   Specimen Description URINE,  RANDOM  Final   Special Requests ADDED [id number]  Final   Culture >=100,000 COLONIES/mL YEAST  Final   Report Status 12/09/2015 FINAL  Final  Culture, blood (routine x 2) Call MD if unable to obtain prior to antibiotics being given     Status: None   Collection Time: 12/07/15  3:00 PM  Result Value Ref Range Status   Specimen Description BLOOD LEFT HAND  Final   Special Requests IN PEDIATRIC BOTTLE [place]  Final   Culture NO GROWTH 5 DAYS  Final   Report Status 12/12/2015 FINAL  Final  Culture, blood (routine x 2) Call MD if unable to obtain prior to antibiotics being given     Status: None   Collection Time: 12/07/15  3:10 PM  Result Value Ref Range Status   Specimen Description BLOOD RIGHT HAND  Final   Special Requests IN PEDIATRIC BOTTLE 1CC  Final   Culture  Setup Time   Final    GRAM POSITIVE COCCI IN CLUSTERS AEROBIC BOTTLE ONLY CRITICAL RESULT CALLED TO, READ BACK BY AND VERIFIED WITH: A SAMUELSON 12/08/15 @ 92 M VESTAL    Culture   Final    STAPHYLOCOCCUS SPECIES (COAGULASE NEGATIVE) THE SIGNIFICANCE OF ISOLATING THIS ORGANISM FROM A SINGLE SET OF BLOOD CULTURES WHEN MULTIPLE SETS ARE DRAWN IS UNCERTAIN. PLEASE NOTIFY THE MICROBIOLOGY DEPARTMENT WITHIN ONE WEEK IF SPECIATION AND SENSITIVITIES ARE REQUIRED.    Report Status 12/10/2015 FINAL  Final  MRSA PCR Screening      Status: None   Collection Time: 12/10/15  1:08 PM  Result Value Ref Range Status   MRSA by PCR NEGATIVE NEGATIVE Final    Comment:        The GeneXpert MRSA Assay (FDA approved for NASAL specimens only), is one component of a comprehensive MRSA colonization surveillance program. It is not intended to diagnose MRSA infection nor to guide or monitor treatment for MRSA infections.     Radiology Reports Ct Abdomen Pelvis Wo Contrast  12/08/2015  CLINICAL DATA:  Diffuse abdominal pain for 1 day. EXAM: CT ABDOMEN AND PELVIS WITHOUT CONTRAST TECHNIQUE: Multidetector CT imaging of the abdomen and pelvis was performed following the standard protocol without IV contrast. COMPARISON:  12/03/2015. FINDINGS: Lower chest: Persistent and slightly larger bilateral pleural effusions and overlying atelectasis or infiltrates. The heart is enlarged. Stable aortic and coronary artery calcifications. Hepatobiliary: No focal hepatic lesions or intrahepatic biliary dilatation. The gallbladder is surgically absent. Pancreas: No mass, inflammation or ductal dilatation. Spleen: Normal size.  No focal lesions. Adrenals/Urinary Tract: Small left kidney is stable. Left renal calculus is unchanged. No hydroureteronephrosis. Stomach/Bowel: The stomach is displaced superiorly and laterally by a a large amount of fluid in the lesser sac. There is also increasing abdominal and pelvic ascites. The duodenum, small bowel and colon are grossly normal. No inflammatory changes, mass lesions or obstructive findings. No leaking oral contrast or free air. Vascular/Lymphatic: Stable tortuous, ectatic and calcified abdominal aorta. No mesenteric or retroperitoneal mass or adenopathy. Stable surgical changes from an right axillofemoral and fem-fem bypass graft. 5.0 x 2.3 cm hematoma noted near the right femoral graft. Other: Worsening diffuse body wall edema suggesting anasarca. The bladder is mildly distended. It does contain air but this  could be from recent catheterization. Increasing free pelvic fluid. Musculoskeletal: No significant/acute osseous findings. IMPRESSION: 1. Enlarging bilateral pleural effusions and bibasilar atelectasis/infiltrates. 2. Increasing abdominal and pelvic fluid. There is a significant amount of fluid in the lesser sac which has  mass effect on the stomach. No findings for perforation. 3. Worsening diffuse body wall edema suggesting anasarca. 4. Stable surgical changes from a right axillofemoral bypass graft and fem-fem bypass graft. Resolving hematoma in the right upper groin area. Electronically Signed   By: Marijo Sanes M.D.   On: 12/08/2015 17:18   Ct Abdomen Pelvis Wo Contrast  12/03/2015  CLINICAL DATA:  Status post axillofemoral bypass with elevated white blood cell count. EXAM: CT ABDOMEN AND PELVIS WITHOUT CONTRAST TECHNIQUE: Multidetector CT imaging of the abdomen and pelvis was performed following the standard protocol without IV contrast. COMPARISON:  November 30, 2015 FINDINGS: New bilateral effusions, left greater than right. Underlying opacity is likely atelectasis. Cardiomegaly persists. No other abnormalities identified in the lung bases. No free air. There is increased attenuation in the subcutaneous fat consistent with volume overload. There is also ascites within the abdomen, particularly the left paracolic gutter, and in the pelvis. The left-sided predominance of ascites could be due to the fact the patient is tilted to the left. Patient is status post cholecystectomy. The liver, spleen, and right adrenal gland are normal. Prominence of the left adrenal gland is likely due to hyperplasia the unchanged. A nonobstructing left renal stone is again identified. Right kidney is unchanged and unremarkable. Pancreas demonstrates no abnormalities. 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 Atherosclerosis. No adenopathy. The stomach is normal. A few mildly  prominent loops of proximal small-bowel are contrast filled and thin walled measuring up to 3 cm. Contrast does extend all the way to the colon. The descending colon is decompressed. The descending colon does demonstrate apparent wall thickening. No pericolonic fat stranding in regions without ascites. The more proximal and well-distended colon is normal. The appendix is not seen but there is no secondary evidence of appendicitis. The pelvis demonstrates previous hysterectomy. The bladder is partially decompressed with a Foley catheter. No adenopathy. There is hematoma surrounding the surgical site in the right groin. Patency and integrity of the graft cannot be assessed on this study due to lack of contrast. However, previously seen aneurysm is no longer visualized. Visualized bones are unremarkable. IMPRESSION: 1. Postoperative changes in the right groin with surrounding hematoma. The hematoma measures 3.6 by 2.9 cm on axial image 82 and 8.5 cm in cranial caudal dimension on image 24. The small amount of adjacent air in the soft tissues may all be postsurgical. 2. Wall thickening in the descending colon could be due to poor distention, as suggested by lack of pericolonic stranding in sites without ascites. However, given fever, colitis, is not completely excluded. Recommend clinical correlation. 3. Fluid overload with ascites and abdominal wall edema. Electronically Signed   By: Dorise Bullion III M.D   On: 12/03/2015 13:26   Ct Abdomen Pelvis Wo Contrast  11/26/2015  CLINICAL DATA:  Right groin pain and swelling, initial encounter EXAM: CT ABDOMEN AND PELVIS WITHOUT CONTRAST TECHNIQUE: Multidetector CT imaging of the abdomen and pelvis was performed following the standard protocol without IV contrast. COMPARISON:  07/09/2007 FINDINGS: Lung bases are free of acute infiltrate or sizable effusion. The gallbladder has been surgically removed. The liver, spleen, adrenal glands and pancreas are within normal  limits. The kidneys are well visualized without renal calculi. A cortical calcification is noted in the left kidney. The abdominal aorta is dilated to 3.1 cm. There are changes consistent with a right axillobifemoral bypass graft. At the touchdown site in the right groin and there is  significant aneurysmal dilatation measuring 4.3 by 4.5 cm. The actual degree and patency within the aneurysmal dilatation cannot be assessed on this exam due to the lack of IV contrast. The femoral crossover graft appears within normal limits. The bladder is well distended. No pelvic mass lesion or sidewall abnormality is noted. The appendix is not well visualized. The osseous structures are within normal limits. 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 The degree of patency of the aneurysmal dilatation is uncertain as no contrast was administered for this exam. Ultrasound may be helpful for further evaluation. Vascular surgery consultation recommended. Dilatation of the abdominal aorta to 3 cm. Recommend followup by ultrasound in 3 years. This recommendation follows ACR consensus guidelines: White Paper of the ACR Incidental Findings Committee II on Vascular Findings. Natasha Mead Coll Radiol 2013; 10:789-794 Electronically Signed   By: Inez Catalina M.D.   On: 11/24/2015 19:38   US Renal  12/10/2015  CLINICAL DATA:  Acute renal failure EXAM: RENAL / URINARY TRACT ULTRASOUND COMPLETE COMPARISON:  CT abdomen pelvis dated 12/08/2015 FINDINGS: Right Kidney: Length: 9.3 cm.  Poorly visualized.  No mass or hydronephrosis. Left Kidney: Length: 8.0 cm.  9 mm upper pole renal calculus.  No hydronephrosis. Bladder: Within normal limits. IMPRESSION: 9 mm left upper pole renal calculus. No hydronephrosis. Electronically Signed   By: Julian Hy M.D.   On: 12/10/2015 17:09   Dg Chest Port 1 View  12/12/2015  CLINICAL DATA:  Patient with recent onset  shortness of breath. EXAM: PORTABLE CHEST 1 VIEW COMPARISON:  Chest radiograph 12/10/2015 FINDINGS: New right upper extremity PICC line is present with tip projecting over the superior cavoatrial junction. Multiple monitoring leads overlie the patient. Single lead AICD device overlies the left hemi thorax, lead is stable in position. Stable cardiac and mediastinal contours. Minimal heterogeneous opacities left lung base. No pleural effusion or pneumothorax. IMPRESSION: New right upper extremity PICC line is present tip projecting over the superior cavoatrial junction. Heterogeneous opacities left lung base favored to represent atelectasis. Infection not excluded. Electronically Signed   By: Lovey Newcomer M.D.   On: 12/12/2015 08:07   Dg Chest Port 1 View  12/10/2015  CLINICAL DATA:  Short of breath EXAM: PORTABLE CHEST 1 VIEW COMPARISON:  12/07/2015. FINDINGS: LEFT-sided pacer with single continuously overlies enlarged cardiac silhouette. There is chronic pulmonary arteries not changed. LEFT basilar atelectasis similar prior. Mild central venous congestion. IMPRESSION: 1. Mild central venous congestion which is mildly worsened. 2. Stable LEFT basilar atelectasis versus infiltrate Electronically Signed   By: Suzy Bouchard M.D.   On: 12/10/2015 07:35   Dg Chest Port 1 View  12/07/2015  CLINICAL DATA:  Shortness of breath with productive cough EXAM: PORTABLE CHEST 1 VIEW COMPARISON:  12/02/15 chest radiograph. FINDINGS: Stable configuration of single lead left subclavian ICD with lead tip overlying the right ventricle. Stable cardiomediastinal silhouette with mild cardiomegaly. No pneumothorax. New small left pleural effusion. New mild pulmonary edema. Patchy right infrahilar opacity appears new. Patchy consolidation at the left lung base appears increased. IMPRESSION: 1. Mild congestive heart failure. 2. New small left pleural effusion. 3. New patchy right infrahilar opacity and increased patchy left lung base  consolidation, suspicious for a combination of atelectasis, pneumonia and/or aspiration. Recommend follow-up chest imaging to resolution. Electronically Signed   By: Ilona Sorrel M.D.   On: 12/07/2015 10:35   Dg Chest Port 1 View  12/02/2015  CLINICAL  DATA:  Shortness of breath. Status post surgical repair of right femoral anastomotic pseudoaneurysm. EXAM: PORTABLE CHEST 1 VIEW COMPARISON:  12/09/2015 FINDINGS: Stable appearance of ICD/pacing device. The heart size and mediastinal contours are normal. Lung volumes are normal without significant atelectasis identified. There is no evidence of pulmonary edema, consolidation, pneumothorax, nodule or pleural fluid. IMPRESSION: No active disease. Electronically Signed   By: Aletta Edouard M.D.   On: 12/02/2015 08:34   Dg Chest Portable 1 View  12/03/2015  CLINICAL DATA:  Chest pain for 1 day EXAM: PORTABLE CHEST 1 VIEW COMPARISON:  Chest x-ray dated 09/15/2013. FINDINGS: Mild cardiomegaly is stable. Left chest wall pacemaker/AICD stable in position. Atherosclerotic changes again noted at the aortic arch. Overall cardiomediastinal silhouette is stable in size and configuration. Lungs are clear. No evidence of pneumonia. No pleural effusion. No pneumothorax seen. Osseous structures about the chest are unremarkable. IMPRESSION: Stable chest x-ray. Stable mild cardiomegaly. Lungs are clear and there is no evidence of acute cardiopulmonary abnormality. Electronically Signed   By: Franki Cabot M.D.   On: 12/04/2015 20:18   Dg Abd Portable 1v  12/12/2015  CLINICAL DATA:  Abdominal pain EXAM: PORTABLE ABDOMEN - 1 VIEW COMPARISON:  None. FINDINGS: Scattered large and small bowel gas is noted. Contrast material is noted throughout the colon consistent with the recent CT examination. No obstructive changes or free air are noted. Bony structures are within normal limits. Left common iliac artery stent is seen. IMPRESSION: No acute abnormality noted. Electronically Signed    By: Inez Catalina M.D.   On: 12/12/2015 08:36    CBC  Recent Labs Lab 12/08/15 0345 12/09/15 0350 12/10/15 0316 12/11/15 0300 12/12/15 0334  WBC 23.0* 22.4* 22.5* 24.7* 28.5*  HGB 9.3* 9.3* 9.4* 8.8* 9.1*  HCT 29.0* 29.7* 28.6* 28.1* 28.3*  PLT 141* 131* 123* 123* 140*  MCV 94.5 95.2 94.4 94.6 96.9  MCH 30.3 29.8 31.0 29.6 31.2  MCHC 32.1 31.3 32.9 31.3 32.2  RDW 17.4* 17.3* 17.5* 17.1* 17.5*    Chemistries   Recent Labs Lab 12/09/15 0350 12/10/15 0316 12/11/15 0300 12/11/15 1654 12/12/15 0334 12/12/15 1154  NA 143 142 144  --  140 142  K 3.3* 3.0* 2.2* 5.1 5.3* 5.3*  CL 112* 110 116*  --  107 107  CO2 21* 21* 21*  --  25 26  GLUCOSE 106* 93 98  --  86 88  BUN 34* 32* 27*  --  36* 37*  CREATININE 2.19* 2.24* 1.64*  --  2.04* 2.13*  CALCIUM 8.5* 8.4* 6.7*  --  8.8* 8.8*  MG  --   --  1.3* 2.8*  --   --   AST 11*  --   --   --   --   --   ALT 6*  --   --   --   --   --   ALKPHOS 62  --   --   --   --   --   BILITOT 1.1  --   --   --   --   --    ------------------------------------------------------------------------------------------------------------------ estimated creatinine clearance is 21.3 mL/min (by C-G formula based on Cr of 2.13). ------------------------------------------------------------------------------------------------------------------ No results for input(s): HGBA1C in the last 72 hours. ------------------------------------------------------------------------------------------------------------------ No results for input(s): CHOL, HDL, LDLCALC, TRIG, CHOLHDL, LDLDIRECT in the last 72 hours. ------------------------------------------------------------------------------------------------------------------ No results for input(s): TSH, T4TOTAL, T3FREE, THYROIDAB in the last 72 hours.  Invalid input(s): FREET3 ------------------------------------------------------------------------------------------------------------------ No results for input(s):  VITAMINB12, FOLATE, FERRITIN, TIBC, IRON, RETICCTPCT in the last 72 hours.  Coagulation profile  Recent Labs Lab 12/07/15 1229  INR 1.23    No results for input(s): DDIMER in the last 72 hours.  Cardiac Enzymes No results for input(s): CKMB, TROPONINI, MYOGLOBIN in the last 168 hours.  Invalid input(s): CK ------------------------------------------------------------------------------------------------------------------ Invalid input(s): POCBNP  No results for input(s): GLUCAP in the last 72 hours.

## 2015-12-14 NOTE — Progress Notes (Signed)
[facility]                                                                                                                                                                         Patient Demographics  [name], is a 70 y.o. female  [id number]  [id number]  DOB - 06-26-1946  Admit date - 12/10/2015  Admitting Physician Waldemar Dickens, MD  Outpatient Primary MD for the patient is Shirline Frees, MD  LOS - 14   Chief Complaint  Patient presents with  . Groin Pain  . Back Pain        Assessment & Plan    Patient seen briefly today due for discharge soon per Discharge done yesterday by Me to residential hospice, no further issues, family bedside and aware of the plan, agreeable to residential hospice want to see her pass away in comfort and peace. Social worker will arrange for residential hospice. We await placement only. Goal of care is simply comfort at this point.    Medications  Scheduled Meds: . morphine  60 mg Oral Q12H   Continuous Infusions:  PRN Meds:.acetaminophen, acetaminophen **OR** [DISCONTINUED] acetaminophen, albuterol, HYDROmorphone (DILAUDID) injection, LORazepam, morphine CONCENTRATE, ondansetron, sodium chloride flush    Time Spent in minutes   10 minutes   Lala Lund K M.D on  at 9:53 AM  Between 7am to 7pm - Pager - [phone]  After 7pm go to [email or web address] - password [email or web address]  And look for the night coverage person covering for me after hours  [facility] Office  [phone]    Subjective:   [name] today Is somnolent and unaable to answer questions  Objective:   Filed Vitals:   12/13/15 2300  0154  0509  0900  BP:   126/66   Pulse:   111   Temp:   97.4 F (36.3 C)   TempSrc:   Axillary   Resp: 7 8 9 16   Height:      Weight:      SpO2:   98%     Wt Readings from Last 3 Encounters:    12/12/15 54.2 kg (119 lb 7.8 oz)  09/27/15 48.353 kg (106 lb 9.6 oz)  09/14/15 48.081 kg (106 lb)     Intake/Output Summary (Last 24 hours) at  0953 Last data filed at 12/13/15 1530  Gross per 24 hour  Intake      0 ml  Output    450 ml  Net   -450 ml    Exam Patient is somnolent and resting comfortably in bed [place].AT,PERRAL Supple Neck,No JVD, No cervical lymphadenopathy appriciated.  Symmetrical Chest wall movement, Good air movement bilaterally, CTAB RRR,No Gallops,Rubs or new Murmurs, No Parasternal Heave +  ve B.Sounds, Abd Soft, Non tender, No organomegaly appriciated, No rebound - guarding or rigidity. No Cyanosis, Clubbing or edema, No new Rash or bruise      Data Review

## 2015-12-17 DEATH — deceased

## 2015-12-19 ENCOUNTER — Telehealth: Payer: Self-pay | Admitting: Cardiology

## 2015-12-19 NOTE — Telephone Encounter (Signed)
Called pt to request a manual transmission b/c of merlin recall. Pt husband informed me that pt is deceased.

## 2016-03-27 IMAGING — CR DG KNEE COMPLETE 4+V*L*
4 series · 4 of 4 positions shown · non-contrast
Comparison: None.

CLINICAL DATA: Reports falling from a chair last night when she
attempted to stand. Pt denies dizziness, weakness or other
complaints prior to fall. Pt reports multiple falls over the last
few months. Pt states "I didn't think it was that bad". Pt denies
hitting head or LOC with fall.

EXAM:
LEFT KNEE - COMPLETE 4+ VIEW

[t knee ap left]
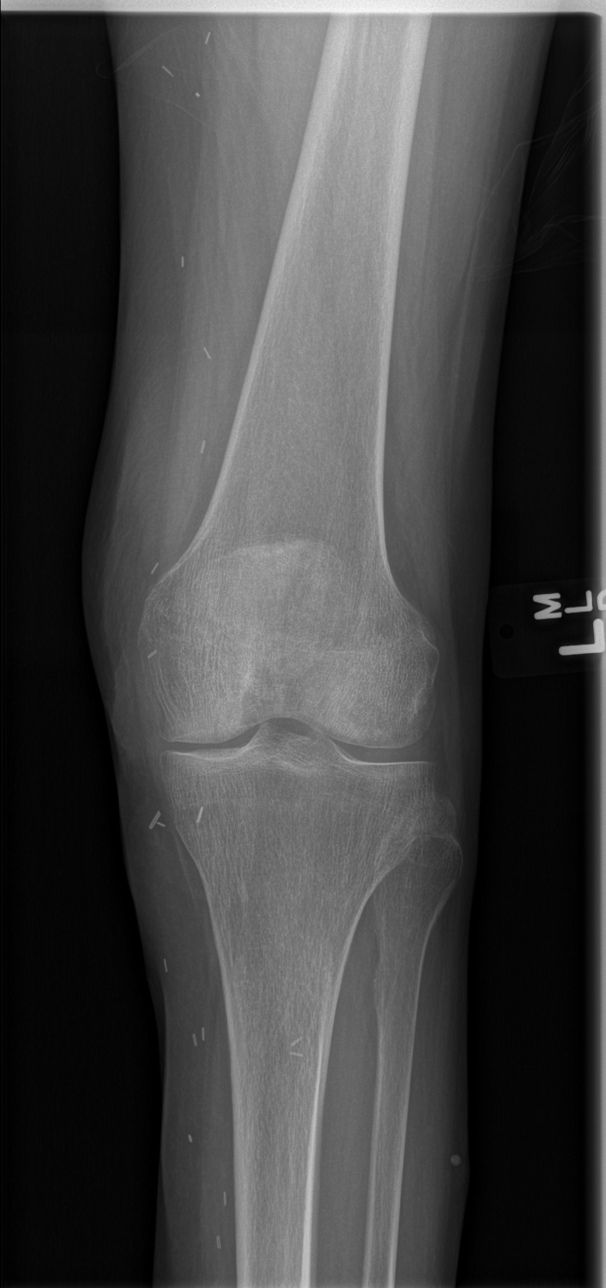

[t knee obl left (1 of 2)]
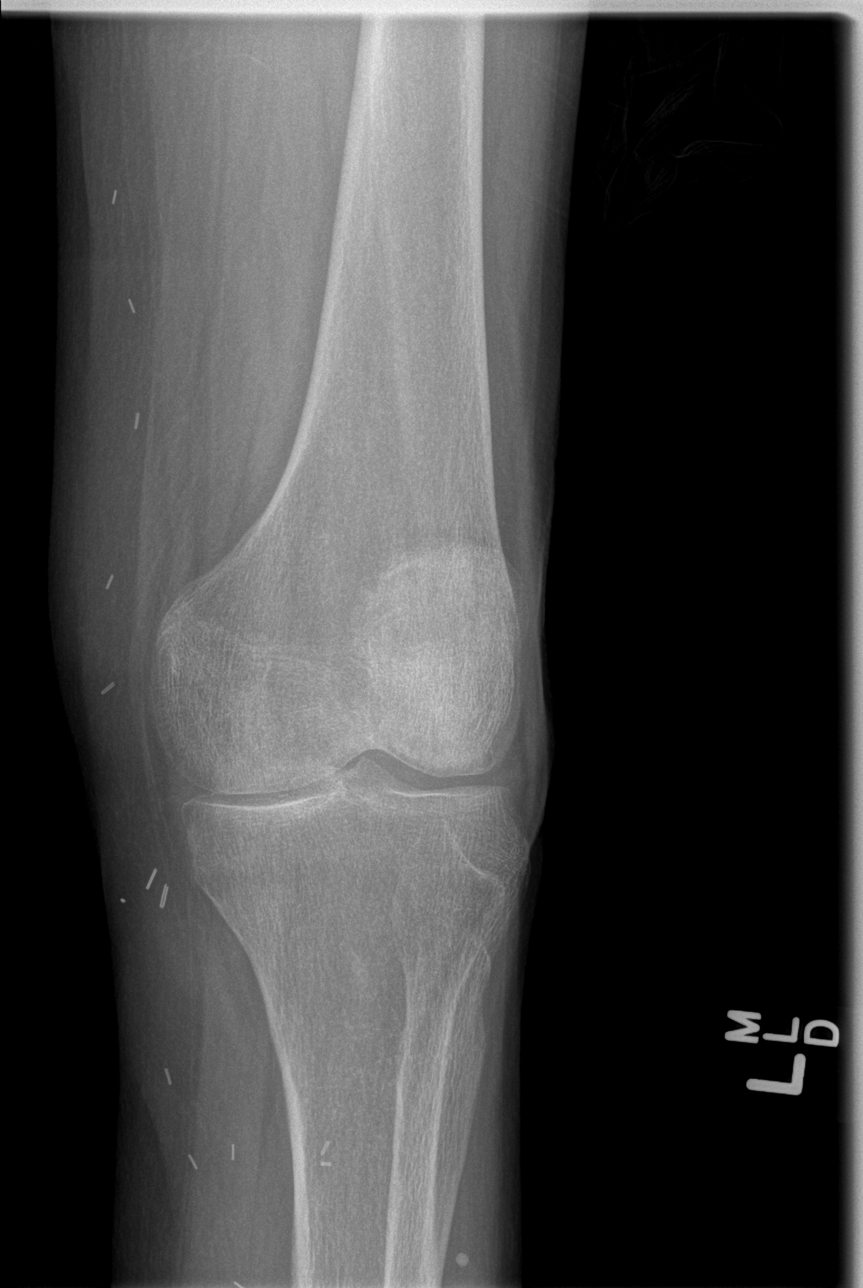

[t knee obl left (2 of 2)]
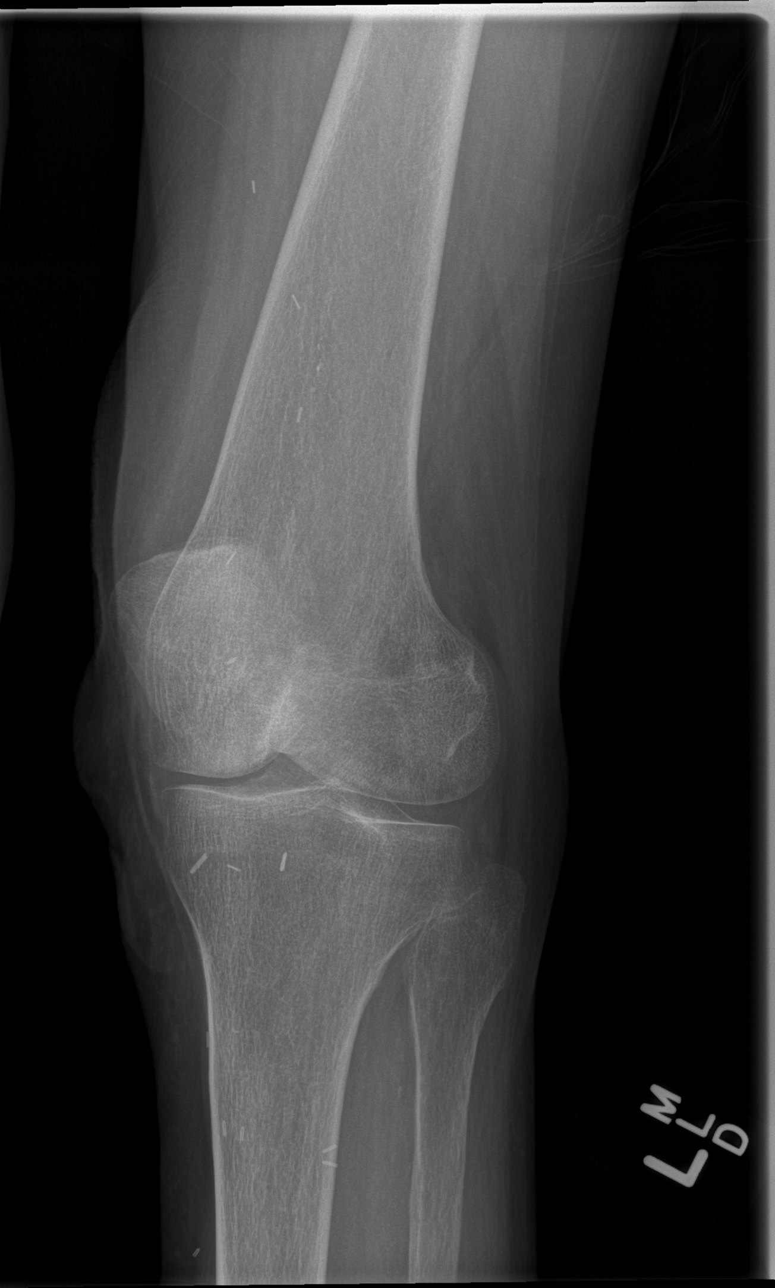

[t knee lat left]
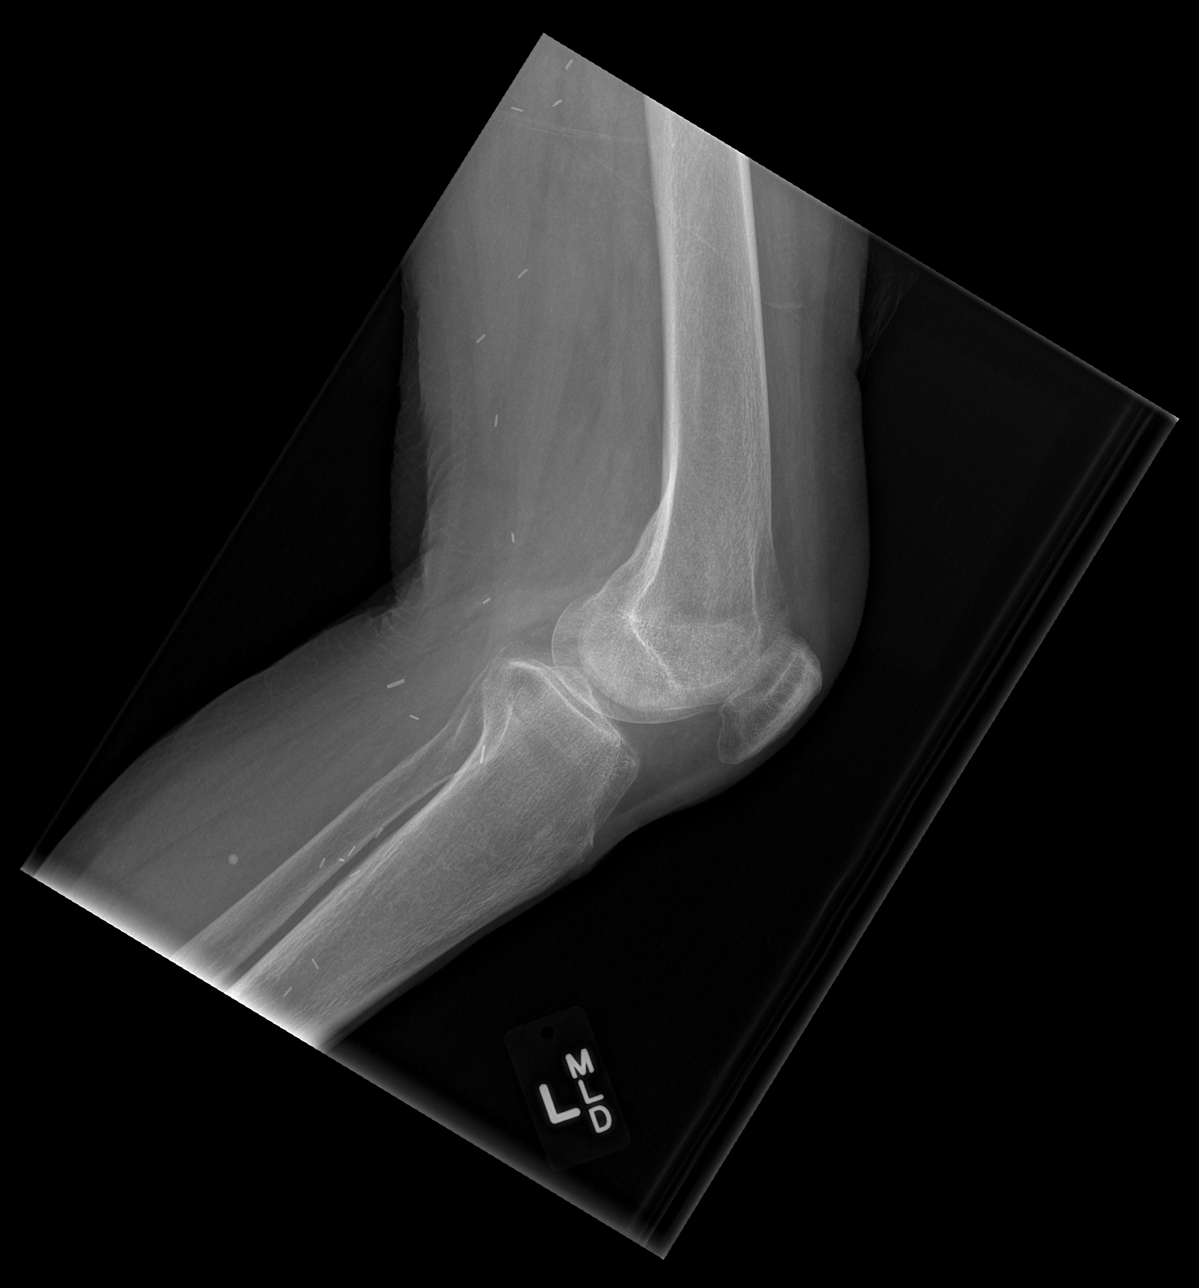

[4 of 4 positions shown; findings below may reference images not displayed]

FINDINGS: There is generalized osteopenia. There is no evidence of fracture,
dislocation, or joint effusion. There is no evidence of arthropathy
or other focal bone abnormality. There are surgical clips along the
posterior left leg. Soft tissues are unremarkable.
IMPRESSION: No acute osseous injury of the left knee.

## 2016-09-25 ENCOUNTER — Encounter (HOSPITAL_COMMUNITY): Payer: Commercial Managed Care - HMO

## 2016-09-25 ENCOUNTER — Ambulatory Visit: Payer: Commercial Managed Care - HMO | Admitting: Family
